# Patient Record
Sex: Female | Born: 1970
Health system: Southern US, Community
[De-identification: ages and names within clinical notes are randomized; demographics above are authoritative.]

## PROBLEM LIST (undated history)

## (undated) SURGERY — APPENDECTOMY, LAPAROSCOPIC
Anesthesia: General

---
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
## 1992-11-20 HISTORY — PX: KNEE ARTHROSCOPY W/ ACL RECONSTRUCTION: SHX1858

## 2000-12-10 ENCOUNTER — Other Ambulatory Visit: Admission: RE | Admit: 2000-12-10 | Discharge: 2000-12-10 | Payer: Self-pay | Admitting: Obstetrics and Gynecology

## 2001-01-13 ENCOUNTER — Ambulatory Visit (HOSPITAL_COMMUNITY): Admission: RE | Admit: 2001-01-13 | Discharge: 2001-01-13 | Payer: Self-pay | Admitting: Obstetrics and Gynecology

## 2001-01-13 ENCOUNTER — Encounter: Payer: Self-pay | Admitting: Obstetrics and Gynecology

## 2001-06-06 ENCOUNTER — Inpatient Hospital Stay (HOSPITAL_COMMUNITY): Admission: AD | Admit: 2001-06-06 | Discharge: 2001-06-06 | Payer: Self-pay | Admitting: Obstetrics and Gynecology

## 2001-06-10 ENCOUNTER — Inpatient Hospital Stay (HOSPITAL_COMMUNITY): Admission: AD | Admit: 2001-06-10 | Discharge: 2001-06-13 | Payer: Self-pay | Admitting: Obstetrics and Gynecology

## 2001-06-14 ENCOUNTER — Encounter: Admission: RE | Admit: 2001-06-14 | Discharge: 2001-07-14 | Payer: Self-pay | Admitting: Obstetrics and Gynecology

## 2007-11-17 ENCOUNTER — Ambulatory Visit: Payer: Self-pay | Admitting: Internal Medicine

## 2009-11-01 ENCOUNTER — Emergency Department: Payer: Self-pay | Admitting: Emergency Medicine

## 2013-04-11 ENCOUNTER — Encounter (HOSPITAL_COMMUNITY): Payer: Self-pay | Admitting: Anesthesiology

## 2013-04-11 ENCOUNTER — Encounter (HOSPITAL_COMMUNITY): Payer: Self-pay | Admitting: *Deleted

## 2013-04-11 ENCOUNTER — Encounter (HOSPITAL_COMMUNITY)
Admission: EM | Disposition: A | Discharge status: Home / Self Care | Payer: Self-pay | Source: Home / Self Care | Attending: Emergency Medicine

## 2013-04-11 ENCOUNTER — Observation Stay (HOSPITAL_COMMUNITY): Payer: BC Managed Care – PPO | Admitting: Anesthesiology

## 2013-04-11 ENCOUNTER — Emergency Department (HOSPITAL_COMMUNITY): Payer: BC Managed Care – PPO

## 2013-04-11 ENCOUNTER — Observation Stay (HOSPITAL_COMMUNITY)
Admission: EM | Admit: 2013-04-11 | Discharge: 2013-04-12 | Disposition: A | Discharge status: Home / Self Care | Payer: BC Managed Care – PPO | Attending: General Surgery | Admitting: General Surgery

## 2013-04-11 DIAGNOSIS — K3580 Unspecified acute appendicitis: Secondary | ICD-10-CM

## 2013-04-11 DIAGNOSIS — K37 Unspecified appendicitis: Secondary | ICD-10-CM

## 2013-04-11 HISTORY — PX: LAPAROSCOPIC APPENDECTOMY: SHX408

## 2013-04-11 LAB — URINALYSIS, ROUTINE W REFLEX MICROSCOPIC
Bilirubin Urine: NEGATIVE
Glucose, UA: NEGATIVE mg/dL
Hgb urine dipstick: NEGATIVE
Ketones, ur: NEGATIVE mg/dL
Leukocytes, UA: NEGATIVE
Nitrite: NEGATIVE
Protein, ur: NEGATIVE mg/dL
Specific Gravity, Urine: 1.008 (ref 1.005–1.030)
Urobilinogen, UA: 0.2 mg/dL (ref 0.0–1.0)
pH: 5.5 (ref 5.0–8.0)

## 2013-04-11 LAB — COMPREHENSIVE METABOLIC PANEL
ALT: 30 U/L (ref 0–35)
AST: 32 U/L (ref 0–37)
Albumin: 4.5 g/dL (ref 3.5–5.2)
Alkaline Phosphatase: 71 U/L (ref 39–117)
BUN: 8 mg/dL (ref 6–23)
CO2: 20 mEq/L (ref 19–32)
Calcium: 9.9 mg/dL (ref 8.4–10.5)
Chloride: 99 mEq/L (ref 96–112)
Creatinine, Ser: 0.68 mg/dL (ref 0.50–1.10)
GFR calc Af Amer: >90 mL/min (ref >90)
GFR calc non Af Amer: >90 mL/min (ref >90)
Glucose, Bld: 103 mg/dL — ABNORMAL HIGH (ref 70–99)
Potassium: 3.5 mEq/L (ref 3.5–5.1)
Sodium: 133 mEq/L — ABNORMAL LOW (ref 135–145)
Total Bilirubin: 0.7 mg/dL (ref 0.3–1.2)
Total Protein: 8.2 g/dL (ref 6.0–8.3)

## 2013-04-11 LAB — CBC WITH DIFFERENTIAL/PLATELET
Basophils Absolute: 0 10*3/uL (ref 0.0–0.1)
Basophils Relative: 0 % (ref 0–1)
Eosinophils Absolute: 0 10*3/uL (ref 0.0–0.7)
Eosinophils Relative: 0 % (ref 0–5)
HCT: 42.5 % (ref 36.0–46.0)
Hemoglobin: 15.6 g/dL — ABNORMAL HIGH (ref 12.0–15.0)
Lymphocytes Relative: 10 % — ABNORMAL LOW (ref 12–46)
Lymphs Abs: 1.1 10*3/uL (ref 0.7–4.0)
MCH: 32.9 pg (ref 26.0–34.0)
MCHC: 36.7 g/dL — ABNORMAL HIGH (ref 30.0–36.0)
MCV: 89.7 fL (ref 78.0–100.0)
Monocytes Absolute: 0.6 10*3/uL (ref 0.1–1.0)
Monocytes Relative: 6 % (ref 3–12)
Neutro Abs: 8.6 10*3/uL — ABNORMAL HIGH (ref 1.7–7.7)
Neutrophils Relative %: 84 % — ABNORMAL HIGH (ref 43–77)
Platelets: 232 10*3/uL (ref 150–400)
RBC: 4.74 MIL/uL (ref 3.87–5.11)
RDW: 12.5 % (ref 11.5–15.5)
WBC: 10.2 10*3/uL (ref 4.0–10.5)

## 2013-04-11 LAB — WET PREP, GENITAL
Clue Cells Wet Prep HPF POC: NONE SEEN
Trich, Wet Prep: NONE SEEN
WBC, Wet Prep HPF POC: NONE SEEN
Yeast Wet Prep HPF POC: NONE SEEN

## 2013-04-11 LAB — POCT PREGNANCY, URINE: Preg Test, Ur: NEGATIVE

## 2013-04-11 SURGERY — APPENDECTOMY, LAPAROSCOPIC
Anesthesia: General | Site: Abdomen | Wound class: Contaminated

## 2013-04-11 MED ORDER — MORPHINE SULFATE 4 MG/ML IJ SOLN
4.0000 mg | Freq: Once | INTRAMUSCULAR | Status: AC
  Administered 2013-04-11: 4 mg via INTRAVENOUS
  Filled 2013-04-11: qty 1

## 2013-04-11 MED ORDER — DIPHENHYDRAMINE HCL 12.5 MG/5ML PO ELIX
12.5000 mg | ORAL_SOLUTION | Freq: Four times a day (QID) | ORAL | Status: DC | PRN
  Filled 2013-04-11: qty 10

## 2013-04-11 MED ORDER — ACETAMINOPHEN 325 MG PO TABS: 650.0000 mg | ORAL_TABLET | Freq: Four times a day (QID) | ORAL | Status: DC | PRN

## 2013-04-11 MED ORDER — LIDOCAINE HCL 1 % IJ SOLN
INTRAMUSCULAR | Status: DC | PRN
  Administered 2013-04-11: 20:00:00 via INTRADERMAL

## 2013-04-11 MED ORDER — ONDANSETRON HCL 4 MG/2ML IJ SOLN
4.0000 mg | Freq: Once | INTRAMUSCULAR | Status: AC
  Administered 2013-04-11: 4 mg via INTRAVENOUS
  Filled 2013-04-11: qty 2

## 2013-04-11 MED ORDER — NEOSTIGMINE METHYLSULFATE 1 MG/ML IJ SOLN
INTRAMUSCULAR | Status: DC | PRN
  Administered 2013-04-11: 5 mg via INTRAVENOUS

## 2013-04-11 MED ORDER — VECURONIUM BROMIDE 10 MG IV SOLR
INTRAVENOUS | Status: DC | PRN
  Administered 2013-04-11: 4 mg via INTRAVENOUS

## 2013-04-11 MED ORDER — FENTANYL CITRATE 0.05 MG/ML IJ SOLN
INTRAMUSCULAR | Status: DC | PRN
  Administered 2013-04-11 (×3): 100 ug via INTRAVENOUS

## 2013-04-11 MED ORDER — KETOROLAC TROMETHAMINE 30 MG/ML IJ SOLN
15.0000 mg | Freq: Once | INTRAMUSCULAR | Status: AC | PRN
  Administered 2013-04-11: 30 mg via INTRAVENOUS

## 2013-04-11 MED ORDER — PROPOFOL 10 MG/ML IV BOLUS
INTRAVENOUS | Status: DC | PRN
  Administered 2013-04-11: 170 mg via INTRAVENOUS

## 2013-04-11 MED ORDER — KETOROLAC TROMETHAMINE 30 MG/ML IJ SOLN
INTRAMUSCULAR | Status: AC
  Filled 2013-04-11: qty 1

## 2013-04-11 MED ORDER — ONDANSETRON HCL 4 MG/2ML IJ SOLN
INTRAMUSCULAR | Status: DC | PRN
  Administered 2013-04-11: 4 mg via INTRAVENOUS

## 2013-04-11 MED ORDER — DEXAMETHASONE SODIUM PHOSPHATE 4 MG/ML IJ SOLN
INTRAMUSCULAR | Status: DC | PRN
  Administered 2013-04-11: 8 mg via INTRAVENOUS

## 2013-04-11 MED ORDER — IOHEXOL 300 MG/ML  SOLN
25.0000 mL | INTRAMUSCULAR | Status: DC | PRN
  Administered 2013-04-11: 25 mL via ORAL

## 2013-04-11 MED ORDER — SUCCINYLCHOLINE CHLORIDE 20 MG/ML IJ SOLN
INTRAMUSCULAR | Status: DC | PRN
  Administered 2013-04-11: 120 mg via INTRAVENOUS

## 2013-04-11 MED ORDER — OXYCODONE-ACETAMINOPHEN 5-325 MG PO TABS
1.0000 | ORAL_TABLET | ORAL | Status: DC | PRN
Start: 2013-04-11 — End: 2013-04-12
  Administered 2013-04-12 (×3): 2 via ORAL
  Filled 2013-04-11 (×3): qty 2

## 2013-04-11 MED ORDER — HYDROMORPHONE HCL PF 1 MG/ML IJ SOLN
INTRAMUSCULAR | Status: AC
  Filled 2013-04-11: qty 1

## 2013-04-11 MED ORDER — SODIUM CHLORIDE 0.9 % IV SOLN
1.0000 g | Freq: Once | INTRAVENOUS | Status: AC
  Administered 2013-04-11: 1 g via INTRAVENOUS
  Filled 2013-04-11: qty 1

## 2013-04-11 MED ORDER — HYDROMORPHONE HCL PF 1 MG/ML IJ SOLN
0.5000 mg | INTRAMUSCULAR | Status: DC | PRN
  Administered 2013-04-11 – 2013-04-12 (×3): 1 mg via INTRAVENOUS
  Filled 2013-04-11 (×3): qty 1

## 2013-04-11 MED ORDER — SODIUM CHLORIDE 0.9 % IV BOLUS (SEPSIS)
1000.0000 mL | Freq: Once | INTRAVENOUS | Status: AC
  Administered 2013-04-11: 1000 mL via INTRAVENOUS

## 2013-04-11 MED ORDER — IOHEXOL 300 MG/ML  SOLN
100.0000 mL | Freq: Once | INTRAMUSCULAR | Status: AC | PRN
  Administered 2013-04-11: 100 mL via INTRAVENOUS

## 2013-04-11 MED ORDER — LACTATED RINGERS IV SOLN
INTRAVENOUS | Status: DC | PRN
  Administered 2013-04-11 (×2): via INTRAVENOUS

## 2013-04-11 MED ORDER — HYDROMORPHONE HCL PF 1 MG/ML IJ SOLN
0.2500 mg | INTRAMUSCULAR | Status: DC | PRN
  Administered 2013-04-11 (×2): 0.5 mg via INTRAVENOUS

## 2013-04-11 MED ORDER — ACETAMINOPHEN 650 MG RE SUPP: 650.0000 mg | Freq: Four times a day (QID) | RECTAL | Status: DC | PRN

## 2013-04-11 MED ORDER — SODIUM CHLORIDE 0.9 % IR SOLN
Status: DC | PRN
  Administered 2013-04-11: 1

## 2013-04-11 MED ORDER — PANTOPRAZOLE SODIUM 40 MG PO TBEC
40.0000 mg | DELAYED_RELEASE_TABLET | Freq: Every day | ORAL | Status: DC
  Administered 2013-04-11 – 2013-04-12 (×2): 40 mg via ORAL
  Filled 2013-04-11 (×2): qty 1

## 2013-04-11 MED ORDER — ARTIFICIAL TEARS OP OINT
TOPICAL_OINTMENT | OPHTHALMIC | Status: DC | PRN
  Administered 2013-04-11: 1 via OPHTHALMIC

## 2013-04-11 MED ORDER — DIPHENHYDRAMINE HCL 50 MG/ML IJ SOLN: 12.5000 mg | Freq: Four times a day (QID) | INTRAMUSCULAR | Status: DC | PRN

## 2013-04-11 MED ORDER — ONDANSETRON HCL 4 MG/2ML IJ SOLN: 4.0000 mg | Freq: Once | INTRAMUSCULAR | Status: DC | PRN

## 2013-04-11 MED ORDER — SODIUM CHLORIDE 0.9 % IR SOLN
Status: DC | PRN
  Administered 2013-04-11: 1000 mL

## 2013-04-11 MED ORDER — LIDOCAINE HCL (CARDIAC) 20 MG/ML IV SOLN
INTRAVENOUS | Status: DC | PRN
  Administered 2013-04-11: 40 mg via INTRAVENOUS
  Administered 2013-04-11: 60 mg via INTRAVENOUS

## 2013-04-11 MED ORDER — POTASSIUM CHLORIDE IN NACL 20-0.9 MEQ/L-% IV SOLN
INTRAVENOUS | Status: DC
  Filled 2013-04-11 (×2): qty 1000

## 2013-04-11 MED ORDER — KCL IN DEXTROSE-NACL 20-5-0.45 MEQ/L-%-% IV SOLN
INTRAVENOUS | Status: DC
  Administered 2013-04-11: 22:00:00 via INTRAVENOUS
  Filled 2013-04-11 (×5): qty 1000

## 2013-04-11 MED ORDER — ONDANSETRON HCL 4 MG/2ML IJ SOLN: 4.0000 mg | Freq: Four times a day (QID) | INTRAMUSCULAR | Status: DC | PRN

## 2013-04-11 MED ORDER — GLYCOPYRROLATE 0.2 MG/ML IJ SOLN
INTRAMUSCULAR | Status: DC | PRN
  Administered 2013-04-11: 0.6 mg via INTRAVENOUS

## 2013-04-11 MED ORDER — MIDAZOLAM HCL 5 MG/5ML IJ SOLN
INTRAMUSCULAR | Status: DC | PRN
  Administered 2013-04-11: 2 mg via INTRAVENOUS

## 2013-04-11 SURGICAL SUPPLY — 41 items
APPLIER CLIP ROT 10 11.4 M/L (STAPLE)
BLADE SURG ROTATE 9660 (MISCELLANEOUS) ×2 IMPLANT
CANISTER SUCTION 2500CC (MISCELLANEOUS) ×2 IMPLANT
CHLORAPREP W/TINT 26ML (MISCELLANEOUS) ×2 IMPLANT
CLIP APPLIE ROT 10 11.4 M/L (STAPLE) IMPLANT
CLOTH BEACON ORANGE TIMEOUT ST (SAFETY) ×2 IMPLANT
COVER SURGICAL LIGHT HANDLE (MISCELLANEOUS) ×2 IMPLANT
CUTTER FLEX LINEAR 45M (STAPLE) ×2 IMPLANT
DERMABOND ADHESIVE PROPEN (GAUZE/BANDAGES/DRESSINGS) ×1
DERMABOND ADVANCED (GAUZE/BANDAGES/DRESSINGS) ×1
DERMABOND ADVANCED .7 DNX12 (GAUZE/BANDAGES/DRESSINGS) ×1 IMPLANT
DERMABOND ADVANCED .7 DNX6 (GAUZE/BANDAGES/DRESSINGS) ×1 IMPLANT
DRAPE UTILITY 15X26 W/TAPE STR (DRAPE) ×4 IMPLANT
DRAPE WARM FLUID 44X44 (DRAPE) ×2 IMPLANT
ELECT REM PT RETURN 9FT ADLT (ELECTROSURGICAL) ×2
ELECTRODE REM PT RTRN 9FT ADLT (ELECTROSURGICAL) ×1 IMPLANT
ENDOLOOP SUT PDS II  0 18 (SUTURE)
ENDOLOOP SUT PDS II 0 18 (SUTURE) IMPLANT
GLOVE BIO SURGEON STRL SZ 6 (GLOVE) ×2 IMPLANT
GLOVE BIOGEL PI IND STRL 6.5 (GLOVE) ×1 IMPLANT
GLOVE BIOGEL PI INDICATOR 6.5 (GLOVE) ×1
GOWN PREVENTION PLUS XXLARGE (GOWN DISPOSABLE) ×4 IMPLANT
GOWN STRL NON-REIN LRG LVL3 (GOWN DISPOSABLE) ×4 IMPLANT
KIT BASIN OR (CUSTOM PROCEDURE TRAY) ×2 IMPLANT
KIT ROOM TURNOVER OR (KITS) ×2 IMPLANT
NS IRRIG 1000ML POUR BTL (IV SOLUTION) ×2 IMPLANT
PAD ARMBOARD 7.5X6 YLW CONV (MISCELLANEOUS) ×4 IMPLANT
POUCH SPECIMEN RETRIEVAL 10MM (ENDOMECHANICALS) ×2 IMPLANT
RELOAD STAPLE TA45 3.5 REG BLU (ENDOMECHANICALS) ×2 IMPLANT
SCALPEL HARMONIC ACE (MISCELLANEOUS) ×2 IMPLANT
SET IRRIG TUBING LAPAROSCOPIC (IRRIGATION / IRRIGATOR) ×2 IMPLANT
SLEEVE ENDOPATH XCEL 5M (ENDOMECHANICALS) ×2 IMPLANT
SPECIMEN JAR SMALL (MISCELLANEOUS) ×2 IMPLANT
SUT MNCRL AB 4-0 PS2 18 (SUTURE) ×2 IMPLANT
TOWEL OR 17X24 6PK STRL BLUE (TOWEL DISPOSABLE) ×2 IMPLANT
TOWEL OR 17X26 10 PK STRL BLUE (TOWEL DISPOSABLE) ×2 IMPLANT
TRAY FOLEY CATH 16FR SILVER (SET/KITS/TRAYS/PACK) ×2 IMPLANT
TRAY LAPAROSCOPIC (CUSTOM PROCEDURE TRAY) ×2 IMPLANT
TROCAR XCEL BLUNT TIP 100MML (ENDOMECHANICALS) ×2 IMPLANT
TROCAR XCEL NON-BLD 5MMX100MML (ENDOMECHANICALS) ×2 IMPLANT
WATER STERILE IRR 1000ML POUR (IV SOLUTION) IMPLANT

## 2013-04-11 NOTE — H&P (Signed)
Seen, examined, agree with above.  Plan lap appy.  Discussed risks with patient and family.

## 2013-04-11 NOTE — Transfer of Care (Signed)
Immediate Anesthesia Transfer of Care Note  Patient: Deanna Mcmillan  Procedure(s) Performed: Procedure(s): APPENDECTOMY LAPAROSCOPIC (N/A)  Patient Location: PACU  Anesthesia Type:General  Level of Consciousness: alert , oriented, sedated, patient cooperative and responds to stimulation  Airway & Oxygen Therapy: Patient Spontanous Breathing and Patient connected to nasal cannula oxygen  Post-op Assessment: Report given to PACU RN, Post -op Vital signs reviewed and stable, Patient moving all extremities and Patient moving all extremities X 4  Post vital signs: Reviewed and stable  Complications: No apparent anesthesia complications

## 2013-04-11 NOTE — Anesthesia Preprocedure Evaluation (Signed)
Anesthesia Evaluation  Patient identified by MRN, date of birth, ID band Patient awake    Reviewed: Allergy & Precautions, H&P , NPO status , Patient's Chart, lab work & pertinent test results  Airway Mallampati: II TM Distance: >3 FB Neck ROM: Full    Dental  (+) Teeth Intact and Dental Advisory Given   Pulmonary  breath sounds clear to auscultation        Cardiovascular Rhythm:Regular Rate:Normal     Neuro/Psych    GI/Hepatic   Endo/Other    Renal/GU      Musculoskeletal   Abdominal (+) + obese,  Abdomen: tender.    Peds  Hematology   Anesthesia Other Findings   Reproductive/Obstetrics                           Anesthesia Physical Anesthesia Plan  ASA: II and emergent  Anesthesia Plan: General   Post-op Pain Management:    Induction: Intravenous  Airway Management Planned: Oral ETT  Additional Equipment:   Intra-op Plan:   Post-operative Plan: Extubation in OR  Informed Consent: I have reviewed the patients History and Physical, chart, labs and discussed the procedure including the risks, benefits and alternatives for the proposed anesthesia with the patient or authorized representative who has indicated his/her understanding and acceptance.   Dental advisory given  Plan Discussed with: CRNA and Anesthesiologist  Anesthesia Plan Comments: (Acute Appendicitis, full stomach Mild obesity  Plan GA with RSI  Roberts Gaudy, MD)        Anesthesia Quick Evaluation

## 2013-04-11 NOTE — ED Provider Notes (Signed)
Medical screening examination/treatment/procedure(s) were conducted as a shared visit with non-physician practitioner(s) and myself.  I personally evaluated the patient during the encounter  Orlie Dakin, MD 04/11/13 1545

## 2013-04-11 NOTE — H&P (Signed)
Deanna Mcmillan is an 42 y.o. female.    PCP:  None currently Referring MD:  Dr. Winfred Leeds Chief Complaint: Acute appendicitis HPI:  42 y.o. female with no medical or surgical history presents to the [facility] complaining of acute, persistent, progressively worsening RLQ abdominal pain beginning at 3 AM this morning. Patient reports that the pain now radiates over to the left side of her abdomen. She denies any urinary symptoms.  Patient also with associated low back pain beginning several hours after the abdominal pain.  Patient reports normal bowel movement this morning and denies of nausea, vomiting or diarrhea. Patient also denies headache, chest pain, shortness of breath, weakness, dizziness.  Patient reports she does not smoke and is a daily drinker. She reports last night she had one large mixed drink but usually only drinks one glass of wine per day.   History reviewed. No pertinent past medical history.  History reviewed. No pertinent past surgical history.  No family history on file. Social History:  reports that she has never smoked. She does not have any smokeless tobacco history on file. She reports that  drinks alcohol. She reports that she does not use illicit drugs.  Allergies: No Known Allergies   (Not in a hospital admission)  Results for orders placed during the hospital encounter of 04/11/13 (from the past 48 hour(s))  CBC WITH DIFFERENTIAL     Status: Abnormal   Collection Time    04/11/13 12:34 PM      Result Value Range   WBC 10.2  4.0 - 10.5 K/uL   RBC 4.74  3.87 - 5.11 MIL/uL   Hemoglobin 15.6 (*) 12.0 - 15.0 g/dL   HCT 42.5  36.0 - 46.0 %   MCV 89.7  78.0 - 100.0 fL   MCH 32.9  26.0 - 34.0 pg   MCHC 36.7 (*) 30.0 - 36.0 g/dL   RDW 12.5  11.5 - 15.5 %   Platelets 232  150 - 400 K/uL   Neutrophils Relative % 84 (*) 43 - 77 %   Neutro Abs 8.6 (*) 1.7 - 7.7 K/uL   Lymphocytes Relative 10 (*) 12 - 46 %   Lymphs Abs 1.1  0.7 - 4.0 K/uL   Monocytes Relative 6  3  - 12 %   Monocytes Absolute 0.6  0.1 - 1.0 K/uL   Eosinophils Relative 0  0 - 5 %   Eosinophils Absolute 0.0  0.0 - 0.7 K/uL   Basophils Relative 0  0 - 1 %   Basophils Absolute 0.0  0.0 - 0.1 K/uL  COMPREHENSIVE METABOLIC PANEL     Status: Abnormal   Collection Time    04/11/13 12:34 PM      Result Value Range   Sodium 133 (*) 135 - 145 mEq/L   Potassium 3.5  3.5 - 5.1 mEq/L   Chloride 99  96 - 112 mEq/L   CO2 20  19 - 32 mEq/L   Glucose, Bld 103 (*) 70 - 99 mg/dL   BUN 8  6 - 23 mg/dL   Creatinine, Ser 0.68  0.50 - 1.10 mg/dL   Calcium 9.9  8.4 - 10.5 mg/dL   Total Protein 8.2  6.0 - 8.3 g/dL   Albumin 4.5  3.5 - 5.2 g/dL   AST 32  0 - 37 U/L   ALT 30  0 - 35 U/L   Alkaline Phosphatase 71  39 - 117 U/L   Total Bilirubin 0.7  0.3 -  1.2 mg/dL   GFR calc non Af Amer >90  >90 mL/min   GFR calc Af Amer >90  >90 mL/min   Comment: (NOTE)     The eGFR has been calculated using the CKD EPI equation.     This calculation has not been validated in all clinical situations.     eGFR's persistently <90 mL/min signify possible Chronic Kidney     Disease.  URINALYSIS, ROUTINE W REFLEX MICROSCOPIC     Status: Abnormal   Collection Time    04/11/13 12:39 PM      Result Value Range   Color, Urine YELLOW  YELLOW   APPearance CLOUDY (*) CLEAR   Specific Gravity, Urine 1.008  1.005 - 1.030   pH 5.5  5.0 - 8.0   Glucose, UA NEGATIVE  NEGATIVE mg/dL   Hgb urine dipstick NEGATIVE  NEGATIVE   Bilirubin Urine NEGATIVE  NEGATIVE   Ketones, ur NEGATIVE  NEGATIVE mg/dL   Protein, ur NEGATIVE  NEGATIVE mg/dL   Urobilinogen, UA 0.2  0.0 - 1.0 mg/dL   Nitrite NEGATIVE  NEGATIVE   Leukocytes, UA NEGATIVE  NEGATIVE   Comment: MICROSCOPIC NOT DONE ON URINES WITH NEGATIVE PROTEIN, BLOOD, LEUKOCYTES, NITRITE, OR GLUCOSE <1000 mg/dL.  POCT PREGNANCY, URINE     Status: None   Collection Time    04/11/13 12:44 PM      Result Value Range   Preg Test, Ur NEGATIVE  NEGATIVE   Comment:            THE  SENSITIVITY OF THIS     METHODOLOGY IS >24 mIU/mL  WET PREP, GENITAL     Status: None   Collection Time    04/11/13  2:08 PM      Result Value Range   Yeast Wet Prep HPF POC NONE SEEN  NONE SEEN   Trich, Wet Prep NONE SEEN  NONE SEEN   Clue Cells Wet Prep HPF POC NONE SEEN  NONE SEEN   WBC, Wet Prep HPF POC NONE SEEN  NONE SEEN   Ct Abdomen Pelvis W Contrast  04/11/2013   *RADIOLOGY REPORT*  Clinical Data: Right lower quadrant abdomen pain  CT ABDOMEN AND PELVIS WITH CONTRAST  Technique:  Multidetector CT imaging of the abdomen and pelvis was performed following the standard protocol during bolus administration of intravenous contrast.  Contrast: 128mL OMNIPAQUE IOHEXOL 300 MG/ML  SOLN  Comparison: None.  Findings: There is a 1.8 cm cyst in the left lobe liver.  With the liver is otherwise normal.  The spleen, pancreas, gallbladder, adrenal glands and kidneys are normal.  There is no hydronephrosis bilaterally.  The aorta is normal.  There is no abdominal lymphadenopathy.  The appendix is enlarged with surrounding inflammation consistent with appendicitis.  There is no small bowel obstruction or diverticulitis.  Fluid filled bladder is normal.  The uterus is normal.  There are small cysts  in normal size bilateral ovaries.  There is minimal dependent atelectasis of the posterior lung bases.Degenerative joint changes of the spine are noted.  IMPRESSION: Acute appendicitis.   Original Report Authenticated By: Abelardo Diesel, M.D.    Review of Systems  Constitutional: Negative for fever and chills.  Respiratory: Negative for cough and wheezing.   Cardiovascular: Negative for chest pain.  Gastrointestinal: Positive for abdominal pain. Negative for heartburn, nausea, vomiting, diarrhea, constipation and blood in stool.  Genitourinary: Negative for dysuria and urgency.  Neurological: Positive for weakness. Negative for dizziness and headaches.  Blood pressure 124/75, pulse 80, temperature 98.5 F  (36.9 C), temperature source Oral, resp. rate 17, last menstrual period 03/06/2013, SpO2 96.00%. Physical Exam   Assessment/Plan Acute appendicitis 1.  Admit to [facility] 2.  NPO, IVF, pain control, antiemetics, antibiotics (Invanz given by ED PA) 3.  OR scheduled around 6:15pm 4.  Discussed risks of bleeding, infection, injury to other structures, and conversion to open.  The patient wishes to proceed. 5.  Hopefully d/c tomorrow am   Coralie Keens 04/11/2013, 3:41 PM

## 2013-04-11 NOTE — ED Notes (Signed)
Pt states that at 0255 pt was woke with lower abdominal pain and radiation now into back.  Pt reports tender to palpations.  No pain or burning with urination.  Pt denies constipation.  No vaginal discharge or bleeding

## 2013-04-11 NOTE — ED Provider Notes (Signed)
CSN: [id number]     Arrival date & time 04/11/13  1156 History   First MD Initiated Contact with Patient 04/11/13 1227     Chief Complaint  Patient presents with  . Abdominal Pain  . Back Pain   (Consider location/radiation/quality/duration/timing/severity/associated sxs/prior Treatment) The history is provided by the patient and medical records. No language interpreter was used.    Deanna Mcmillan is a 42 y.o. female  with no medical or surgical history presents to the Emergency Department complaining of acute, persistent, progressively worsening right lower quadrant abdominal pain beginning at 3 AM this morning. Patient reports that the pain now radiates over to the left side of her abdomen and all blood around her back. She denies history of kidney stone, dysuria, hematuria, frequency, urgency. She also denies vaginal discharge or bleeding. Patient also with associated low back pain beginning several hours after the abdominal pain. Patient reports that last menstrual period was with 4 weeks ago.  Standing and walking seem to make the pain slightly better and palpation makes it worse. Patient reports normal bowel movement this morning and no history of vomiting or diarrhea.  Patient also denies headache, neck pain, chest pain, shortness of breath, weakness, dizziness, syncope, dysuria, hematuria.  Patient reports she does not smoke and is a daily drinker. She reports last night she had one large mixed drink but usually only drinks one glass of wine per day.  History reviewed. No pertinent past medical history. History reviewed. No pertinent past surgical history. No family history on file. History  Substance Use Topics  . Smoking status: Never Smoker   . Smokeless tobacco: Not on file  . Alcohol Use: Yes     Comment: daily one glass of wine   OB History   Grav Para Term Preterm Abortions TAB SAB Ect Mult Living                 Review of Systems  Constitutional: Negative for fever,  diaphoresis, appetite change, fatigue and unexpected weight change.  HENT: Negative for mouth sores and neck stiffness.   Eyes: Negative for visual disturbance.  Respiratory: Negative for cough, chest tightness, shortness of breath and wheezing.   Cardiovascular: Negative for chest pain.  Gastrointestinal: Positive for nausea and abdominal pain. Negative for vomiting, diarrhea and constipation.  Endocrine: Negative for polydipsia, polyphagia and polyuria.  Genitourinary: Negative for dysuria, urgency, frequency and hematuria.  Musculoskeletal: Positive for back pain.  Skin: Negative for rash.  Allergic/Immunologic: Negative for immunocompromised state.  Neurological: Negative for syncope, light-headedness and headaches.  Hematological: Does not bruise/bleed easily.  Psychiatric/Behavioral: Negative for sleep disturbance. The patient is not nervous/anxious.     Allergies  Review of patient's allergies indicates no known allergies.  Home Medications   Current Outpatient Rx  Name  Route  Sig  Dispense  Refill  . naproxen sodium (ANAPROX) 220 MG tablet   Oral   Take 220 mg by mouth as needed (for headaches.).         Marland Kitchen OMEPRAZOLE PO   Oral   Take 1 tablet by mouth daily as needed (for acid reflux.).          BP 124/75  Pulse 80  Temp(Src) 98.5 F (36.9 C) (Oral)  Resp 17  SpO2 96%  LMP 03/06/2013 Physical Exam  Nursing note and vitals reviewed. Constitutional: She is oriented to person, place, and time. She appears well-developed and well-nourished. No distress.  Awake, alert, nontoxic appearance  HENT:  Head:  Normocephalic and atraumatic.  Mouth/Throat: Oropharynx is clear and moist. No oropharyngeal exudate.  Eyes: Conjunctivae are normal. Pupils are equal, round, and reactive to light. No scleral icterus.  Neck: Normal range of motion. Neck supple.  Cardiovascular: Normal rate, regular rhythm, normal heart sounds and intact distal pulses.   No murmur  heard. Pulmonary/Chest: Effort normal and breath sounds normal. No respiratory distress. She has no wheezes.  Abdominal: Soft. Bowel sounds are normal. She exhibits no distension and no mass. There is no hepatomegaly. There is tenderness in the right upper quadrant, right lower quadrant and suprapubic area. There is guarding. There is no rebound and no CVA tenderness.  Patient with severe right lower cardiac abdominal pain to palpation, mild right upper quadrant pain to palpation and mild suprapubic pain to palpation No CVA tenderness No palpable hepatomegaly  Musculoskeletal: Normal range of motion. She exhibits no edema.  Lymphadenopathy:    She has no cervical adenopathy.  Neurological: She is alert and oriented to person, place, and time. She exhibits normal muscle tone. Coordination normal.  Speech is clear and goal oriented Moves extremities without ataxia  Skin: Skin is warm and dry. No rash noted. She is not diaphoretic. No erythema.  Psychiatric: She has a normal mood and affect.    ED Course  Procedures (including critical care time) Labs Review Labs Reviewed  CBC WITH DIFFERENTIAL - Abnormal; Notable for the following:    Hemoglobin 15.6 (*)    MCHC 36.7 (*)    Neutrophils Relative % 84 (*)    Neutro Abs 8.6 (*)    Lymphocytes Relative 10 (*)    All other components within normal limits  COMPREHENSIVE METABOLIC PANEL - Abnormal; Notable for the following:    Sodium 133 (*)    Glucose, Bld 103 (*)    All other components within normal limits  URINALYSIS, ROUTINE W REFLEX MICROSCOPIC - Abnormal; Notable for the following:    APPearance CLOUDY (*)    All other components within normal limits  WET PREP, GENITAL  GC/CHLAMYDIA PROBE AMP  POCT PREGNANCY, URINE   Imaging Review Ct Abdomen Pelvis W Contrast  04/11/2013   *RADIOLOGY REPORT*  Clinical Data: Right lower quadrant abdomen pain  CT ABDOMEN AND PELVIS WITH CONTRAST  Technique:  Multidetector CT imaging of the  abdomen and pelvis was performed following the standard protocol during bolus administration of intravenous contrast.  Contrast: 178mL OMNIPAQUE IOHEXOL 300 MG/ML  SOLN  Comparison: None.  Findings: There is a 1.8 cm cyst in the left lobe liver.  With the liver is otherwise normal.  The spleen, pancreas, gallbladder, adrenal glands and kidneys are normal.  There is no hydronephrosis bilaterally.  The aorta is normal.  There is no abdominal lymphadenopathy.  The appendix is enlarged with surrounding inflammation consistent with appendicitis.  There is no small bowel obstruction or diverticulitis.  Fluid filled bladder is normal.  The uterus is normal.  There are small cysts  in normal size bilateral ovaries.  There is minimal dependent atelectasis of the posterior lung bases.Degenerative joint changes of the spine are noted.  IMPRESSION: Acute appendicitis.   Original Report Authenticated By: Abelardo Diesel, M.D.    MDM   1. Appendicitis      Deanna Mcmillan presents with right lower abdominal pain concerning for possible appendicitis versus colitis. Will obtain pelvic exam to rule out PID or pelvic pain.  Also consideration is possible renal colic though this is of low likelihood.    Wet  prep normal, pregnancy test negative, UA without evidence of urinary tract infection, CBC without leukocytosis and CMP unremarkable.   CT scan of abdomen with acute appendicitis. Patient's last oral intake was 6 PM yesterday evening. Discussed with Dr. Barry Dienes of general surgery who will evaluate and treat. Patient began on Invanz IV. Pain controlled this time. Patient is without intractable vomiting and vital signs are stable.  Jarrett Soho Laderrick Wilk, PA-C 04/11/13 1521

## 2013-04-11 NOTE — Preoperative (Signed)
Beta Blockers   Reason not to administer Beta Blockers:Not Applicable 

## 2013-04-11 NOTE — ED Provider Notes (Signed)
Complains of diffuse lower abdominal pain onset approximately 12 hours ago. Pain has since migrated to the right lower quadrant. CT scan consistent with acute appendicitis  Orlie Dakin, MD 04/11/13 [id number]

## 2013-04-11 NOTE — Op Note (Signed)
Appendectomy, Lap, Procedure Note  Indications: The patient presented with a history of right-sided abdominal pain. A CT revealed findings consistent with acute appendicitis.  Pre-operative Diagnosis: Acute appendicitis without mention of peritonitis  Post-operative Diagnosis: Same  Surgeon: [facility]   Anesthesia: General endotracheal anesthesia and Local anesthesia 1% plain lidocaine, 0.25.% bupivacaine, with epinephrine  Procedure Details  The patient was seen again in the Holding Room. The risks, benefits, complications, treatment options, and expected outcomes were discussed with the patient and/or family. The possibilities of perforation of viscus, bleeding, recurrent infection, the need for additional procedures, failure to diagnose a condition, and creating a complication requiring transfusion or operation were discussed. There was concurrence with the proposed plan and informed consent was obtained. The site of surgery was properly noted. The patient was taken to Operating Room, identified as Deanna Mcmillan and the procedure verified as Appendectomy. A Time Out was held and the above information confirmed.  The patient was placed in the supine position and general anesthesia was induced, along with placement of orogastric tube, Venodyne boots, and a Foley catheter. The abdomen was prepped and draped in a sterile fashion. Local anesthetic was infiltrated in the infraumbilical region.  A 1.5 cm curvilinear transverse incision was made just below the umbilicus.  The Kelly clamp was used to spread the subcutaneous tissues.  The fascia was elevated with 2 Kocher clamps and incised with the #11 blade.  A Claiborne Billings was used to confirm entrance into the peritoneal cavity.  A pursestring suture was placed around the fascial incision.  The Hasson trocar was inserted into the abdomen and held in place with the tails of the suture.  The pneumoperitoneum was then established to steady pressure of 15  mmHg.     Additional 5 mm cannulas then placed in the left lower quadrant of the abdomen and the suprapubic region under direct visualization.  A careful evaluation of the entire abdomen was carried out. The patient was placed in Trendelenburg and rotated to the left.  The small intestines were retracted in the cephalad and left lateral direction away from the pelvis and right lower quadrant. The patient was found to have an enlarged and inflamed appendix that was extending into the pelvis. There was no evidence of perforation.  The appendix was carefully dissected. The appendix was was skeletonized with the harmonic scalpel.   The appendix was divided at its base using an endo-GIA stapler. Minimal appendiceal stump was left in place. The appendix was removed from the abdomen with an Endocatch bag through the left subcostal port.  There was no evidence of bleeding, leakage, or complication after division of the appendix. Irrigation was also performed and irrigate suctioned from the abdomen as well.  The 5 mm trocars were removed.  The pneumoperitoneum was evacuated from the abdomen.    The trocar site skin wounds were closed with 4-0 Monocryl and dressed with Dermabond.  Instrument, sponge, and needle counts were correct at the conclusion of the case.   Findings: The appendix was found to be inflamed. There were mild signs of necrosis.  There was not perforation. There was not abscess formation.  Estimated Blood Loss:  Minimal         Drains: none         Specimens: appendix to pathology         Complications:  None; patient tolerated the procedure well.         Disposition: PACU - hemodynamically stable.  Condition: stable

## 2013-04-12 ENCOUNTER — Encounter (HOSPITAL_COMMUNITY): Payer: Self-pay | Admitting: Anesthesiology

## 2013-04-12 DIAGNOSIS — K3580 Unspecified acute appendicitis: Secondary | ICD-10-CM

## 2013-04-12 LAB — CBC
Hemoglobin: 12.5 g/dL (ref 12.0–15.0)
MCHC: 34 g/dL (ref 30.0–36.0)
MCV: 92 fL (ref 78.0–100.0)
RDW: 12.8 % (ref 11.5–15.5)
WBC: 8.2 10*3/uL (ref 4.0–10.5)

## 2013-04-12 LAB — BASIC METABOLIC PANEL
BUN: 7 mg/dL (ref 6–23)
CO2: 23 mEq/L (ref 19–32)
Creatinine, Ser: 0.68 mg/dL (ref 0.50–1.10)
GFR calc Af Amer: >90 mL/min (ref >90)
GFR calc non Af Amer: >90 mL/min (ref >90)
Glucose, Bld: 148 mg/dL — ABNORMAL HIGH (ref 70–99)
Potassium: 3.8 mEq/L (ref 3.5–5.1)

## 2013-04-12 MED ORDER — OXYCODONE-ACETAMINOPHEN 5-325 MG PO TABS: 1.0000 | ORAL_TABLET | ORAL | Status: DC | PRN

## 2013-04-12 NOTE — Anesthesia Postprocedure Evaluation (Signed)
  Anesthesia Post-op Note  Patient: Deanna Mcmillan  Procedure(s) Performed: Procedure(s): APPENDECTOMY LAPAROSCOPIC (N/A)  Patient Location: PACU  Anesthesia Type:General  Level of Consciousness: awake, alert  and oriented  Airway and Oxygen Therapy: Patient Spontanous Breathing  Post-op Pain: mild  Post-op Assessment: Post-op Vital signs reviewed, Patient's Cardiovascular Status Stable, Respiratory Function Stable, Patent Airway and Pain level controlled  Post-op Vital Signs: stable  Complications: No apparent anesthesia complications

## 2013-04-12 NOTE — Progress Notes (Signed)
1 Day Post-Op  Subjective: Having incisional soreness.  Objective: Vital signs in last 24 hours: Temp:  [96.6 F (35.9 C)-98.5 F (36.9 C)] 98.4 F (36.9 C) (09/21 0404) Pulse Rate:  [66-88] 66 (09/21 0404) Resp:  [14-18] 16 (09/21 0404) BP: (104-154)/(45-87) 113/61 mmHg (09/21 0404) SpO2:  [93 %-100 %] 95 % (09/21 0404) Weight:  [235 lb 14.3 oz (107 kg)] 235 lb 14.3 oz (107 kg) (09/20 2113) Last BM Date: 04/11/13  Intake/Output from previous day: 09/20 0701 - 09/21 0700 In: 2980 [P.O.:840; I.V.:2140] Out: 200 [Urine:200] Intake/Output this shift:    PE: General- In NAD Abdomen-soft, incisions clean and intact  Lab Results:   Recent Labs  04/11/13 1234 04/12/13 0633  WBC 10.2 8.2  HGB 15.6* 12.5  HCT 42.5 36.8  PLT 232 178   BMET  Recent Labs  04/11/13 1234 04/12/13 0633  NA 133* 131*  K 3.5 3.8  CL 99 98  CO2 20 23  GLUCOSE 103* 148*  BUN 8 7  CREATININE 0.68 0.68  CALCIUM 9.9 8.8   PT/INR No results found for this basename: LABPROT, INR,  in the last 72 hours Comprehensive Metabolic Panel:    Component Value Date/Time   NA 131* 04/12/2013 0633   K 3.8 04/12/2013 0633   CL 98 04/12/2013 0633   CO2 23 04/12/2013 0633   BUN 7 04/12/2013 0633   CREATININE 0.68 04/12/2013 0633   GLUCOSE 148* 04/12/2013 0633   CALCIUM 8.8 04/12/2013 0633   AST 32 04/11/2013 1234   ALT 30 04/11/2013 1234   ALKPHOS 71 04/11/2013 1234   BILITOT 0.7 04/11/2013 1234   PROT 8.2 04/11/2013 1234   ALBUMIN 4.5 04/11/2013 1234     Studies/Results: Ct Abdomen Pelvis W Contrast  04/11/2013   *RADIOLOGY REPORT*  Clinical Data: Right lower quadrant abdomen pain  CT ABDOMEN AND PELVIS WITH CONTRAST  Technique:  Multidetector CT imaging of the abdomen and pelvis was performed following the standard protocol during bolus administration of intravenous contrast.  Contrast: 157mL OMNIPAQUE IOHEXOL 300 MG/ML  SOLN  Comparison: None.  Findings: There is a 1.8 cm cyst in the left lobe liver.   With the liver is otherwise normal.  The spleen, pancreas, gallbladder, adrenal glands and kidneys are normal.  There is no hydronephrosis bilaterally.  The aorta is normal.  There is no abdominal lymphadenopathy.  The appendix is enlarged with surrounding inflammation consistent with appendicitis.  There is no small bowel obstruction or diverticulitis.  Fluid filled bladder is normal.  The uterus is normal.  There are small cysts  in normal size bilateral ovaries.  There is minimal dependent atelectasis of the posterior lung bases.Degenerative joint changes of the spine are noted.  IMPRESSION: Acute appendicitis.   Original Report Authenticated By: Abelardo Diesel, M.D.    Anti-infectives: Anti-infectives   Start     Dose/Rate Route Frequency Ordered Stop   04/11/13 1545  ertapenem (INVANZ) 1 g in sodium chloride 0.9 % 50 mL IVPB     1 g 100 mL/hr over 30 Minutes Intravenous  Once 04/11/13 1519 04/11/13 1749      Assessment Principal Problem:   Appendicitis, acute s/p laparoscopic appendectomy 04/11/13-stable overnight    LOS: 1 day   Plan: Advance diet.  Ambulate.  Home later today or tomorrow.  Discharge instructions given to her.   Deanna Mcmillan 04/12/2013

## 2013-04-14 ENCOUNTER — Encounter (HOSPITAL_COMMUNITY): Payer: Self-pay | Admitting: General Surgery

## 2013-04-20 ENCOUNTER — Encounter (INDEPENDENT_AMBULATORY_CARE_PROVIDER_SITE_OTHER): Payer: BC Managed Care – PPO | Admitting: General Surgery

## 2013-04-20 NOTE — Discharge Summary (Signed)
Physician Discharge Summary  Patient ID: Deanna Mcmillan MRN: [id number] DOB/AGE: 1970-09-08 42 y.o.  Admit date: 04/11/2013 Discharge date: 04/20/2013  Admitting Diagnosis: Acute appendicitis  Discharge Diagnosis Patient Active Problem List   Diagnosis Date Noted  . Appendicitis, acute s/p laparoscopic appendectomy 04/11/13 04/12/2013    Consultants None  Imaging: No results found.  Procedures Dr. Barry Dienes (04/11/13) - Laparoscopic Appendectomy  Hospital Course:  42 y.o. female with no medical or surgical history presents to the [facility] complaining of acute, persistent, progressively worsening RLQ abdominal pain beginning at 3 AM on the morning of admission. Patient reports that the pain radiated over to the left side of her abdomen. She denied any urinary symptoms. Patient also with associated low back pain beginning several hours after the abdominal pain. Patient reports normal bowel movement this morning and denies of nausea, vomiting or diarrhea.   Workup showed acute appendicitis without leukocytosis or perforation.  Patient was admitted and underwent procedure listed above.  Tolerated procedure well and was transferred to the floor.  Diet was advanced as tolerated.  On POD #1, the patient was voiding well, tolerating diet, ambulating well, pain well controlled, vital signs stable, incisions c/d/i and felt stable for discharge home.  Patient will follow up in our office in 3 weeks and knows to call with questions or concerns.     Medication List         naproxen sodium 220 MG tablet  Commonly known as:  ANAPROX  Take 220 mg by mouth as needed (for headaches.).     OMEPRAZOLE PO  Take 1 tablet by mouth daily as needed (for acid reflux.).     oxyCODONE-acetaminophen 5-325 MG per tablet  Commonly known as:  PERCOCET/ROXICET  Take 1-2 tablets by mouth every 4 (four) hours as needed.           Signed: Coralie Keens, [facility] Surgery [phone]  04/20/2013,  11:38 AM

## 2013-04-21 NOTE — Discharge Summary (Signed)
Agree with summary. 

## 2013-05-12 ENCOUNTER — Encounter (INDEPENDENT_AMBULATORY_CARE_PROVIDER_SITE_OTHER): Payer: BC Managed Care – PPO

## 2015-08-24 IMAGING — CT CT ABD-PELV W/ CM
2 of 5 series · 17 of 46 positions shown, 19 images · IV contrast (CONTRAST)
Comparison: None.

CLINICAL DATA: Right lower quadrant abdomen pain

CT ABDOMEN AND PELVIS WITH CONTRAST
TECHNIQUE: Multidetector CT imaging of the abdomen and pelvis was
performed following the standard protocol during bolus
administration of intravenous contrast.
Contrast: 100mL OMNIPAQUE IOHEXOL 300 MG/ML  SOLN

[Series 2: routine · axial · 0.89mm/px · z∈[-466,-56]mm · 14 of 92 slices shown, 16 images]
[im 5/92  soft-tissue]
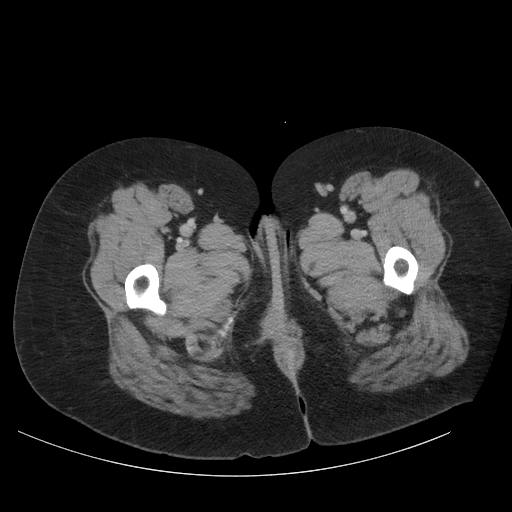
[im 5/92  bone]
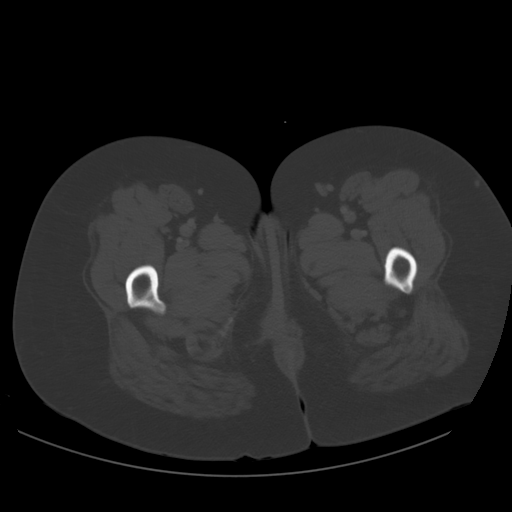
[im 10/92  soft-tissue]
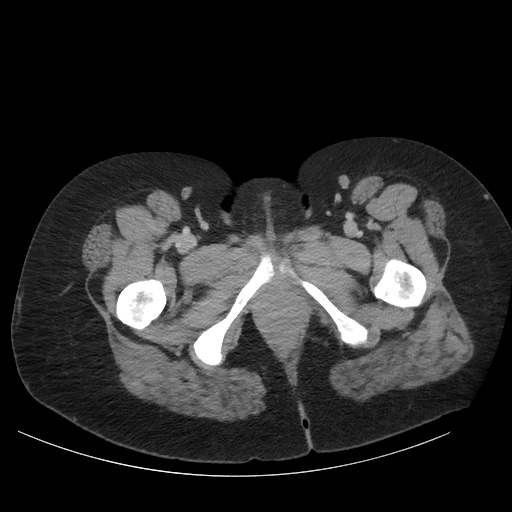
[im 20/92  soft-tissue]
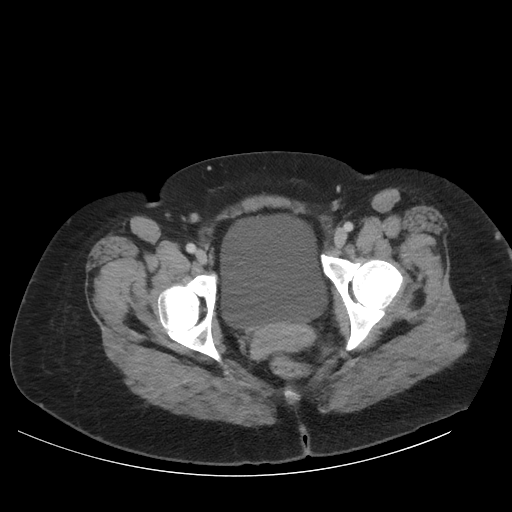
[im 24/92  soft-tissue]
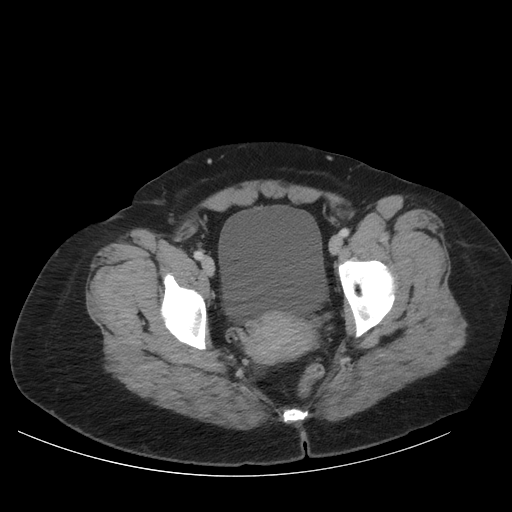
[im 29/92  soft-tissue]
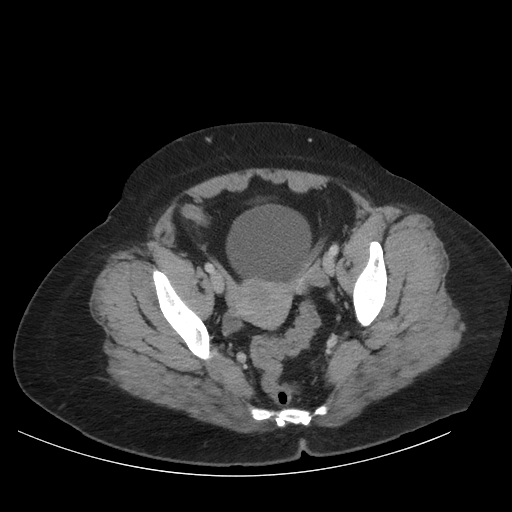
[im 39/92  soft-tissue]
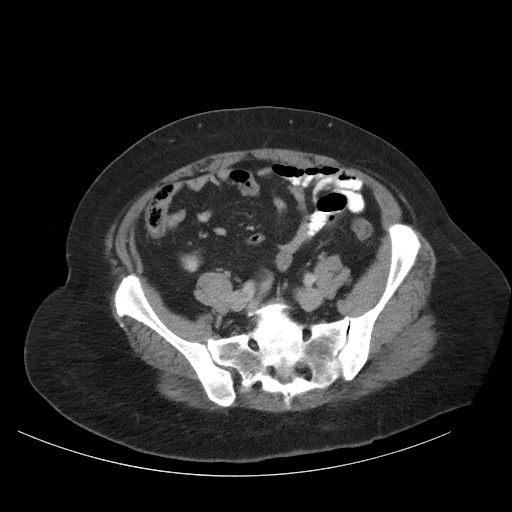
[im 44/92  soft-tissue]
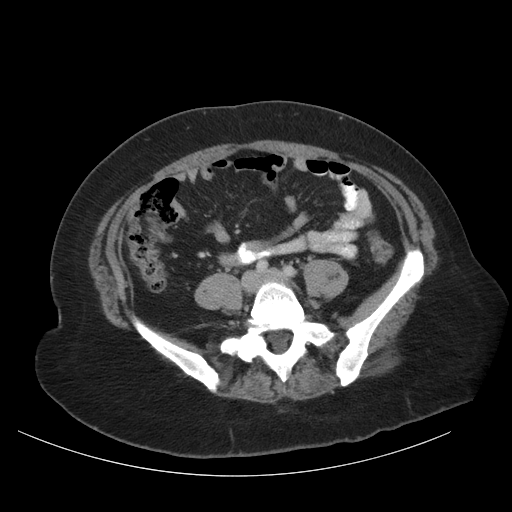
[im 48/92  soft-tissue]
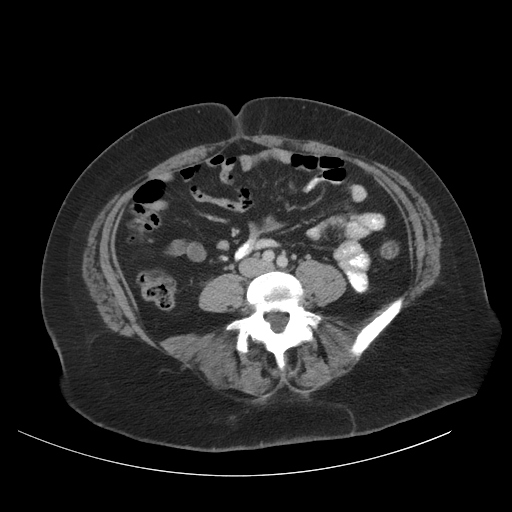
[im 53/92  soft-tissue]
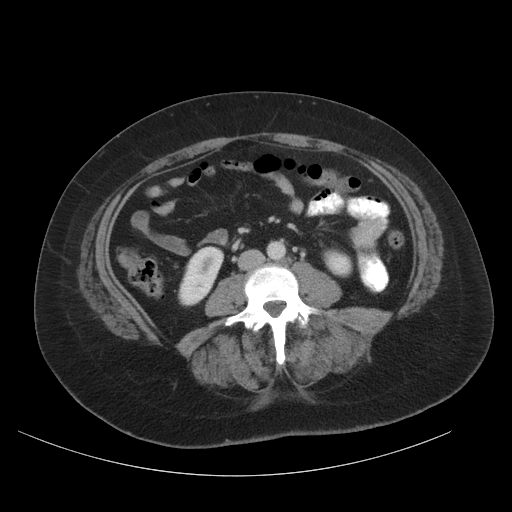
[im 53/92  bone]
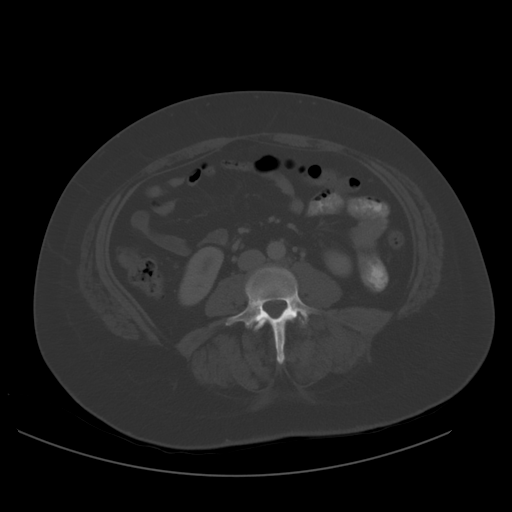
[im 63/92  soft-tissue]
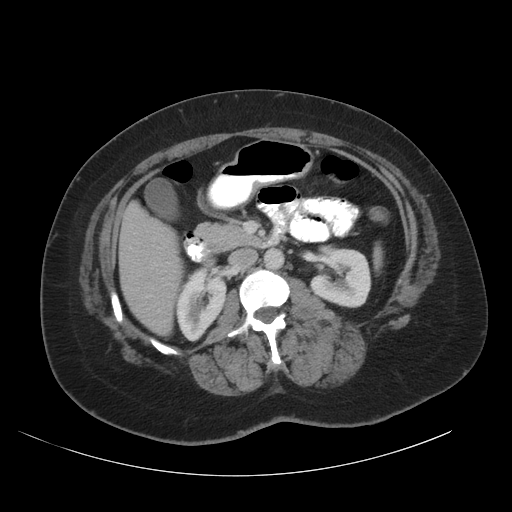
[im 68/92  soft-tissue]
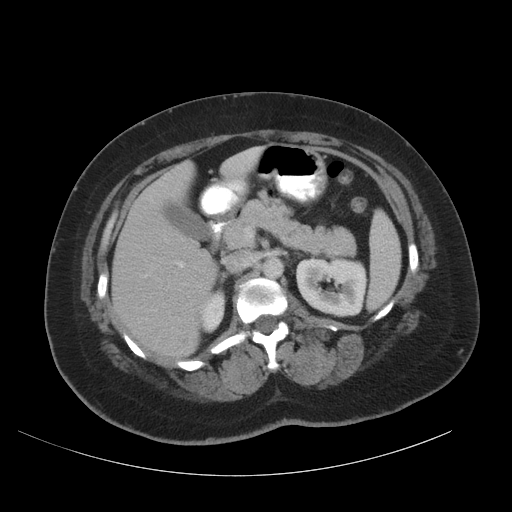
[im 72/92  soft-tissue]
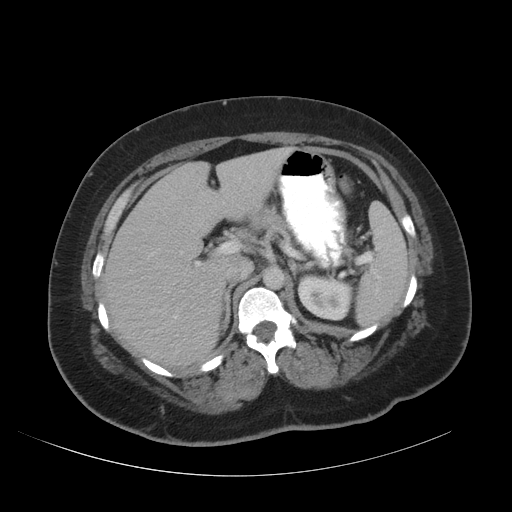
[im 82/92  soft-tissue]
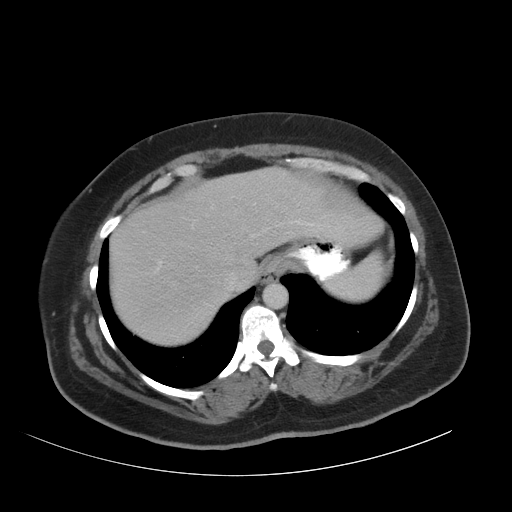
[im 87/92  soft-tissue]
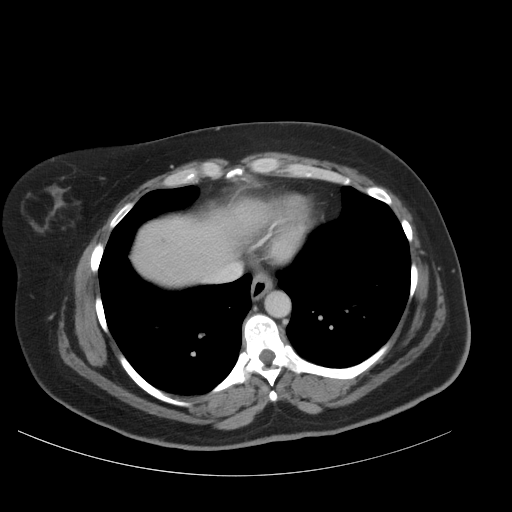

[Series 8040: mpr, coronals, coronal · coronal · 0.89mm/px · 3 of 126 slices shown]
[im 42/126  soft-tissue]
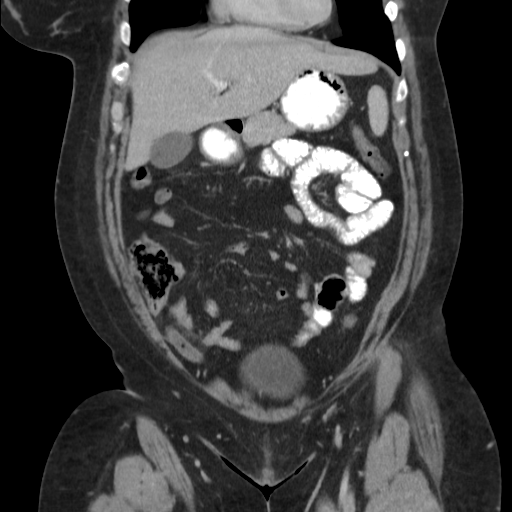
[im 56/126  soft-tissue]
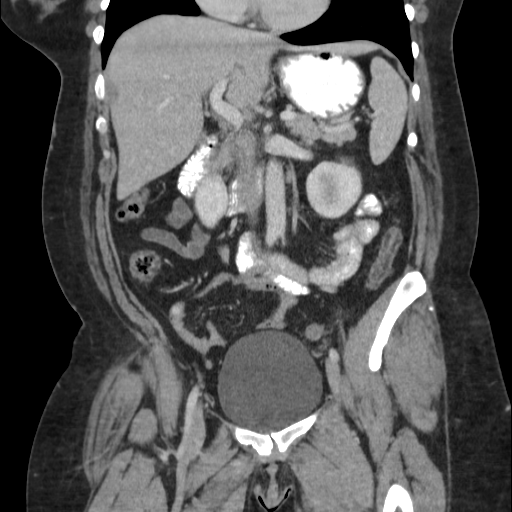
[im 70/126  soft-tissue]
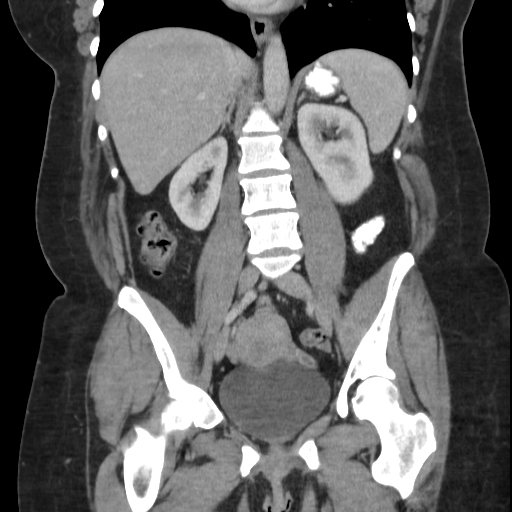

[17 of 46 positions shown; findings below may reference images not displayed]

FINDINGS: There is a 1.8 cm cyst in the left lobe liver.  With the
liver is otherwise normal.  The spleen, pancreas, gallbladder,
adrenal glands and kidneys are normal.  There is no hydronephrosis
bilaterally.  The aorta is normal.  There is no abdominal
lymphadenopathy.

The appendix is enlarged with surrounding inflammation consistent
with appendicitis.  There is no small bowel obstruction or
diverticulitis.

Fluid filled bladder is normal.  The uterus is normal.  There are
small cysts  in normal size bilateral ovaries.  There is minimal
dependent atelectasis of the posterior lung bases.Degenerative
joint changes of the spine are noted.
IMPRESSION: Acute appendicitis.

## 2019-10-22 ENCOUNTER — Ambulatory Visit: Payer: Self-pay | Attending: Internal Medicine

## 2022-04-22 ENCOUNTER — Other Ambulatory Visit: Payer: Self-pay

## 2022-04-22 ENCOUNTER — Encounter (HOSPITAL_COMMUNITY): Payer: Self-pay

## 2022-04-22 ENCOUNTER — Emergency Department (HOSPITAL_COMMUNITY): Payer: 59

## 2022-04-22 ENCOUNTER — Inpatient Hospital Stay (HOSPITAL_COMMUNITY)
Admission: EM | Admit: 2022-04-22 | Discharge: 2022-04-27 | DRG: 432 | Disposition: A | Discharge status: Home / Self Care | Payer: 59 | Attending: Internal Medicine | Admitting: Internal Medicine

## 2022-04-22 DIAGNOSIS — T380X5A Adverse effect of glucocorticoids and synthetic analogues, initial encounter: Secondary | ICD-10-CM | POA: Diagnosis present

## 2022-04-22 DIAGNOSIS — D539 Nutritional anemia, unspecified: Secondary | ICD-10-CM | POA: Diagnosis present

## 2022-04-22 DIAGNOSIS — K828 Other specified diseases of gallbladder: Secondary | ICD-10-CM | POA: Diagnosis present

## 2022-04-22 DIAGNOSIS — K7200 Acute and subacute hepatic failure without coma: Principal | ICD-10-CM

## 2022-04-22 DIAGNOSIS — E876 Hypokalemia: Secondary | ICD-10-CM | POA: Diagnosis not present

## 2022-04-22 DIAGNOSIS — R600 Localized edema: Secondary | ICD-10-CM | POA: Diagnosis present

## 2022-04-22 DIAGNOSIS — K7010 Alcoholic hepatitis without ascites: Principal | ICD-10-CM | POA: Diagnosis present

## 2022-04-22 DIAGNOSIS — D638 Anemia in other chronic diseases classified elsewhere: Secondary | ICD-10-CM | POA: Diagnosis present

## 2022-04-22 DIAGNOSIS — K709 Alcoholic liver disease, unspecified: Secondary | ICD-10-CM | POA: Diagnosis not present

## 2022-04-22 DIAGNOSIS — J9811 Atelectasis: Secondary | ICD-10-CM | POA: Diagnosis present

## 2022-04-22 DIAGNOSIS — R21 Rash and other nonspecific skin eruption: Secondary | ICD-10-CM | POA: Diagnosis present

## 2022-04-22 DIAGNOSIS — Z79899 Other long term (current) drug therapy: Secondary | ICD-10-CM

## 2022-04-22 DIAGNOSIS — E871 Hypo-osmolality and hyponatremia: Secondary | ICD-10-CM | POA: Diagnosis present

## 2022-04-22 DIAGNOSIS — Z7189 Other specified counseling: Secondary | ICD-10-CM

## 2022-04-22 DIAGNOSIS — E669 Obesity, unspecified: Secondary | ICD-10-CM | POA: Diagnosis present

## 2022-04-22 DIAGNOSIS — F1010 Alcohol abuse, uncomplicated: Secondary | ICD-10-CM | POA: Diagnosis present

## 2022-04-22 DIAGNOSIS — R7989 Other specified abnormal findings of blood chemistry: Secondary | ICD-10-CM | POA: Diagnosis present

## 2022-04-22 DIAGNOSIS — Z66 Do not resuscitate: Secondary | ICD-10-CM | POA: Diagnosis not present

## 2022-04-22 DIAGNOSIS — R531 Weakness: Secondary | ICD-10-CM | POA: Diagnosis not present

## 2022-04-22 DIAGNOSIS — E8809 Other disorders of plasma-protein metabolism, not elsewhere classified: Secondary | ICD-10-CM | POA: Diagnosis not present

## 2022-04-22 DIAGNOSIS — F321 Major depressive disorder, single episode, moderate: Secondary | ICD-10-CM

## 2022-04-22 DIAGNOSIS — E1165 Type 2 diabetes mellitus with hyperglycemia: Secondary | ICD-10-CM | POA: Diagnosis present

## 2022-04-22 DIAGNOSIS — R17 Unspecified jaundice: Secondary | ICD-10-CM | POA: Diagnosis present

## 2022-04-22 DIAGNOSIS — Z811 Family history of alcohol abuse and dependence: Secondary | ICD-10-CM

## 2022-04-22 DIAGNOSIS — Y900 Blood alcohol level of less than 20 mg/100 ml: Secondary | ICD-10-CM | POA: Diagnosis present

## 2022-04-22 DIAGNOSIS — Z9049 Acquired absence of other specified parts of digestive tract: Secondary | ICD-10-CM

## 2022-04-22 DIAGNOSIS — R9431 Abnormal electrocardiogram [ECG] [EKG]: Secondary | ICD-10-CM | POA: Diagnosis present

## 2022-04-22 DIAGNOSIS — R443 Hallucinations, unspecified: Secondary | ICD-10-CM | POA: Diagnosis not present

## 2022-04-22 DIAGNOSIS — F419 Anxiety disorder, unspecified: Secondary | ICD-10-CM | POA: Diagnosis present

## 2022-04-22 DIAGNOSIS — K760 Fatty (change of) liver, not elsewhere classified: Secondary | ICD-10-CM | POA: Diagnosis present

## 2022-04-22 DIAGNOSIS — Z6837 Body mass index (BMI) 37.0-37.9, adult: Secondary | ICD-10-CM

## 2022-04-22 DIAGNOSIS — I781 Nevus, non-neoplastic: Secondary | ICD-10-CM | POA: Diagnosis present

## 2022-04-22 DIAGNOSIS — K852 Alcohol induced acute pancreatitis without necrosis or infection: Secondary | ICD-10-CM | POA: Diagnosis present

## 2022-04-22 DIAGNOSIS — Z6372 Alcoholism and drug addiction in family: Secondary | ICD-10-CM

## 2022-04-22 LAB — COMPREHENSIVE METABOLIC PANEL
ALT: 125 U/L — ABNORMAL HIGH (ref 0–44)
AST: 316 U/L — ABNORMAL HIGH (ref 15–41)
Albumin: 2.8 g/dL — ABNORMAL LOW (ref 3.5–5.0)
Alkaline Phosphatase: 117 U/L (ref 38–126)
Anion gap: 13 (ref 5–15)
BUN: 38 mg/dL — ABNORMAL HIGH (ref 6–20)
CO2: 25 mmol/L (ref 22–32)
Calcium: 9.2 mg/dL (ref 8.9–10.3)
Chloride: 82 mmol/L — ABNORMAL LOW (ref 98–111)
Creatinine, Ser: 0.93 mg/dL (ref 0.44–1.00)
GFR, Estimated: >60 mL/min (ref >60)
Glucose, Bld: 125 mg/dL — ABNORMAL HIGH (ref 70–99)
Potassium: 3.3 mmol/L — ABNORMAL LOW (ref 3.5–5.1)
Sodium: 120 mmol/L — ABNORMAL LOW (ref 135–145)
Total Bilirubin: 27.9 mg/dL (ref 0.3–1.2)
Total Protein: 7.1 g/dL (ref 6.5–8.1)

## 2022-04-22 LAB — CBC
HCT: 27 % — ABNORMAL LOW (ref 36.0–46.0)
Hemoglobin: 10.1 g/dL — ABNORMAL LOW (ref 12.0–15.0)
MCH: 39.1 pg — ABNORMAL HIGH (ref 26.0–34.0)
MCHC: 37.4 g/dL — ABNORMAL HIGH (ref 30.0–36.0)
MCV: 104.7 fL — ABNORMAL HIGH (ref 80.0–100.0)
Platelets: 174 10*3/uL (ref 150–400)
RBC: 2.58 MIL/uL — ABNORMAL LOW (ref 3.87–5.11)
RDW: 18.1 % — ABNORMAL HIGH (ref 11.5–15.5)
WBC: 11.3 10*3/uL — ABNORMAL HIGH (ref 4.0–10.5)
nRBC: 0 % (ref 0.0–0.2)

## 2022-04-22 LAB — I-STAT BETA HCG BLOOD, ED (MC, WL, AP ONLY): I-stat hCG, quantitative: <5 m[IU]/mL (ref <5)

## 2022-04-22 LAB — AMMONIA: Ammonia: 27 umol/L (ref 9–35)

## 2022-04-22 LAB — LACTIC ACID, PLASMA: Lactic Acid, Venous: 1.5 mmol/L (ref 0.5–1.9)

## 2022-04-22 LAB — PROTIME-INR
INR: 1.6 — ABNORMAL HIGH (ref 0.8–1.2)
Prothrombin Time: 19.1 seconds — ABNORMAL HIGH (ref 11.4–15.2)

## 2022-04-22 LAB — SALICYLATE LEVEL: Salicylate Lvl: 11.4 mg/dL (ref 7.0–30.0)

## 2022-04-22 LAB — ACETAMINOPHEN LEVEL: Acetaminophen (Tylenol), Serum: <10 ug/mL — ABNORMAL LOW (ref 10–30)

## 2022-04-22 LAB — LIPASE, BLOOD: Lipase: 67 U/L — ABNORMAL HIGH (ref 11–51)

## 2022-04-22 MED ORDER — LORAZEPAM 1 MG PO TABS: 1.0000 mg | ORAL_TABLET | ORAL | Status: AC | PRN

## 2022-04-22 MED ORDER — LORAZEPAM 2 MG/ML IJ SOLN
0.0000 mg | Freq: Two times a day (BID) | INTRAMUSCULAR | Status: AC
  Administered 2022-04-25: 2 mg via INTRAVENOUS
  Filled 2022-04-22: qty 1

## 2022-04-22 MED ORDER — FOLIC ACID 1 MG PO TABS
1.0000 mg | ORAL_TABLET | Freq: Every day | ORAL | Status: DC
  Administered 2022-04-23 – 2022-04-27 (×5): 1 mg via ORAL
  Filled 2022-04-22 (×5): qty 1

## 2022-04-22 MED ORDER — LORAZEPAM 2 MG/ML IJ SOLN: 0.0000 mg | Freq: Four times a day (QID) | INTRAMUSCULAR | Status: AC

## 2022-04-22 MED ORDER — ALBUMIN HUMAN 25 % IV SOLN
12.5000 g | Freq: Once | INTRAVENOUS | Status: AC
  Administered 2022-04-23: 12.5 g via INTRAVENOUS
  Filled 2022-04-22: qty 50

## 2022-04-22 MED ORDER — SODIUM CHLORIDE 0.9 % IV SOLN: INTRAVENOUS | Status: DC

## 2022-04-22 MED ORDER — THIAMINE MONONITRATE 100 MG PO TABS
100.0000 mg | ORAL_TABLET | Freq: Every day | ORAL | Status: DC
  Administered 2022-04-23 – 2022-04-27 (×5): 100 mg via ORAL
  Filled 2022-04-22 (×5): qty 1

## 2022-04-22 MED ORDER — ADULT MULTIVITAMIN W/MINERALS CH
1.0000 | ORAL_TABLET | Freq: Every day | ORAL | Status: DC
  Administered 2022-04-23 – 2022-04-27 (×5): 1 via ORAL
  Filled 2022-04-22 (×5): qty 1

## 2022-04-22 MED ORDER — LORAZEPAM 2 MG/ML IJ SOLN: 1.0000 mg | INTRAMUSCULAR | Status: AC | PRN

## 2022-04-22 MED ORDER — THIAMINE HCL 100 MG/ML IJ SOLN
100.0000 mg | Freq: Every day | INTRAMUSCULAR | Status: DC
  Filled 2022-04-22: qty 2

## 2022-04-22 NOTE — Subjective & Objective (Signed)
Weakness, fatigue, body aches for a week  Undergone detox from [facility] 3 wks ago Now notes jaundice and leg swelling Reports SOB At baseline drinks 1.5 L of wine per day Last [facility] was 3 wks ago Did so by herself at home  No seizures

## 2022-04-22 NOTE — ED Provider Triage Note (Signed)
Emergency Medicine Provider Triage Evaluation Note  Deanna Mcmillan , a 51 y.o. female  was evaluated in triage.  Pt with several complaints. Finished alcohol detox 3 weeks ago. Been complaining of generalized weakness and unsteadiness for 1 week, getting progressively worse. Noticed a rash on her chest, back and arms. Bilateral leg swelling x 2 days. Her son noticed yellowing of her eyes and skin the past several days.   Review of Systems  Positive: As above, constipation Negative: Nausea, vomiting, diarrhea, fever  Physical Exam  There were no vitals taken for this visit. Gen:   Awake, no distress   Resp:  Normal effort  MSK:   Moves extremities without difficulty  Other:  Bilateral leg swelling, yellowing of the skin with scleral icterus  Medical Decision Making  Medically screening exam initiated at 9:00 PM.  Appropriate orders placed.  Deanna Mcmillan was informed that the remainder of the evaluation will be completed by another provider, this initial triage assessment does not replace that evaluation, and the importance of remaining in the ED until their evaluation is complete.  Workup initiated   Deanna Ishmael T, PA-C 04/22/22 2100

## 2022-04-22 NOTE — ED Triage Notes (Signed)
Pt BIB [facility] with reports of generalized weakness x 1 week, alcohol detox about 3 weeks ago. Pt has a rash to her chest, back, and arms x 2 days ago. Pt has yellowing of her eyes and skin x 2 days. Pt has swelling to both lower extremities.

## 2022-04-22 NOTE — H&P (Signed)
[name] CWC:[id number] DOB: [birth year] DOA: 04/22/2022     PCP: Patient, No Pcp Per   Outpatient Specialists:     Patient arrived to ER on 04/22/22 at 2046 Referred by Attending Rancour, Annie Main, MD   Patient coming from:    home Lives alone,   *** With family    Chief Complaint:   Chief Complaint  Patient presents with   Weakness    HPI: Deanna Mcmillan is a 51 y.o. female with medical history significant of ETOH abuse    Presented with  jaundice and leg edema  Weakness, fatigue, body aches for a week  Undergone detox from etoh 3 wks ago Now notes jaundice and leg swelling Reports SOB At baseline drinks 1.5 L of wine per day Last etoh was 3 wks ago Did so by herself at home  No seizures   Regarding pertinent Chronic problems:    ****Hyperlipidemia - *on statins {statin:315258}  Lipid Panel  No results found for: "CHOL", "TRIG", "HDL", "CHOLHDL", "VLDL", "LDLCALC", "LDLDIRECT", "LABVLDL"  ***HTN on   ***chronic CHF diastolic/systolic/ combined - last echo***  *** CAD  - On Aspirin, statin, betablocker, Plavix                 - *followed by cardiology                - last cardiac cath  The ASCVD Risk score (Arnett DK, et al., 2019) failed to calculate for the following reasons:   Cannot find a previous HDL lab   Cannot find a previous total cholesterol lab    ***DM 2 - No results found for: "HGBA1C" ****on insulin, PO meds only, diet controlled  ***Hypothyroidism: No results found for: "TSH" on synthroid    obesity-   BMI Readings from Last 1 Encounters:  04/22/22 37.76 kg/m     *** Asthma -well *** controlled on home inhalers/ nebs f                        ***last no prior***admission  ***                       No ***history of intubation  *** COPD - not **followed by pulmonology *** not  on baseline oxygen  *L,    *** OSA -on nocturnal oxygen, *CPAP, *noncompliant with CPAP     Liver disease MELD 3.0: 28 at 04/22/2022  9:55 PM    Chronic anemia - baseline hg Hemoglobin & Hematocrit  Recent Labs    04/22/22 2140  HGB 10.1*    While in ER:   INR 1.6 Total bili 27 NA 120 Alb 2.8  Plts 174  CXR -  NON acute  [place] RUQ was ordered  CTabd/pelvis - ***nonacute  CTA chest - ***nonacute, no PE, * no evidence of infiltrate  Following Medications were ordered in ER: Medications  0.9 %  sodium chloride infusion ( Intravenous New Bag/Given 04/22/22 2309)  LORazepam (ATIVAN) tablet 1-4 mg (has no administration in time range)    Or  LORazepam (ATIVAN) injection 1-4 mg (has no administration in time range)  thiamine (VITAMIN B1) tablet 100 mg (has no administration in time range)    Or  thiamine (VITAMIN B1) injection 100 mg (has no administration in time range)  folic acid (FOLVITE) tablet 1 mg (has no administration in time range)  multivitamin with minerals tablet 1 tablet (has no administration in  time range)  LORazepam (ATIVAN) injection 0-4 mg ( Intravenous Not Given 04/22/22 2315)    Followed by  LORazepam (ATIVAN) injection 0-4 mg (has no administration in time range)    _______________________________________________________ ER Provider Called:  [facility]   Dr.Karki They Recommend admit to medicine  RUQ [place] Will see in AM      ED Triage Vitals  Enc Vitals Group     BP 04/22/22 2106 (!) 124/56     Pulse Rate 04/22/22 2106 89     Resp 04/22/22 2106 14     Temp 04/22/22 2106 98.2 F (36.8 C)     Temp Source 04/22/22 2106 Oral     SpO2 04/22/22 2106 94 %     Weight 04/22/22 2100 220 lb (99.8 kg)     Height 04/22/22 2100 5\' 4"  (1.626 m)     Head Circumference --      Peak Flow --      Pain Score 04/22/22 2100 0     Pain Loc --      Pain Edu? --      Excl. in [place]? --   TMAX(24)@     _________________________________________ Significant initial  Findings: Abnormal Labs Reviewed  LIPASE, BLOOD - Abnormal; Notable for the following components:      Result Value   Lipase 67 (*)    All other  components within normal limits  COMPREHENSIVE METABOLIC PANEL - Abnormal; Notable for the following components:   Sodium 120 (*)    Potassium 3.3 (*)    Chloride 82 (*)    Glucose, Bld 125 (*)    BUN 38 (*)    Albumin 2.8 (*)    AST 316 (*)    ALT 125 (*)    Total Bilirubin 27.9 (*)    All other components within normal limits  CBC - Abnormal; Notable for the following components:   WBC 11.3 (*)    RBC 2.58 (*)    Hemoglobin 10.1 (*)    HCT 27.0 (*)    MCV 104.7 (*)    MCH 39.1 (*)    MCHC 37.4 (*)    RDW 18.1 (*)    All other components within normal limits  PROTIME-INR - Abnormal; Notable for the following components:   Prothrombin Time 19.1 (*)    INR 1.6 (*)    All other components within normal limits  ACETAMINOPHEN LEVEL - Abnormal; Notable for the following components:   Acetaminophen (Tylenol), Serum <10 (*)    All other components within normal limits     ECG: Ordered Personally reviewed and interpreted by me showing: HR : 87 Rhythm: Sinus rhythm Low voltage, precordial leads Nonspecific T abnormalities, lateral leads Prolonged QT interval No previous ECGs available QTC 523     The recent clinical data is shown below. Vitals:   04/22/22 2100 04/22/22 2106 04/22/22 2300  BP:  (!) 124/56   Pulse:  89 86  Resp:  14   Temp:  98.2 F (36.8 C)   TempSrc:  Oral   SpO2:  94%   Weight: 99.8 kg    Height: 5\' 4"  (1.626 m)      WBC     Component Value Date/Time   WBC 11.3 (H) 04/22/2022 2140   LYMPHSABS 1.1 04/11/2013 1234   MONOABS 0.6 04/11/2013 1234   EOSABS 0.0 04/11/2013 1234   BASOSABS 0.0 04/11/2013 1234     Lactic Acid, Venous    Component Value Date/Time   LATICACIDVEN  1.5 04/22/2022 2155       UA *** no evidence of UTI  ***Pending ***not ordered   Urine analysis:    Component Value Date/Time   COLORURINE YELLOW 04/11/2013 1239   APPEARANCEUR CLOUDY (A) 04/11/2013 1239   LABSPEC 1.008 04/11/2013 1239   PHURINE 5.5 04/11/2013  1239   GLUCOSEU NEGATIVE 04/11/2013 1239   HGBUR NEGATIVE 04/11/2013 1239   BILIRUBINUR NEGATIVE 04/11/2013 1239   KETONESUR NEGATIVE 04/11/2013 1239   PROTEINUR NEGATIVE 04/11/2013 1239   UROBILINOGEN 0.2 04/11/2013 1239   NITRITE NEGATIVE 04/11/2013 1239   LEUKOCYTESUR NEGATIVE 04/11/2013 1239    Results for orders placed or performed during the hospital encounter of 04/11/13  Wet prep, genital     Status: None   Collection Time: 04/11/13  2:08 PM   Specimen: Genital  Result Value Ref Range Status   Yeast Wet Prep HPF POC NONE SEEN NONE SEEN Final   Trich, Wet Prep NONE SEEN NONE SEEN Final   Clue Cells Wet Prep HPF POC NONE SEEN NONE SEEN Final   WBC, Wet Prep HPF POC NONE SEEN NONE SEEN Final  GC/Chlamydia Probe Amp (multiple spec sources)     Status: None   Collection Time: 04/11/13  2:08 PM  Result Value Ref Range Status   CT Probe RNA NEGATIVE NEGATIVE Final   GC Probe RNA NEGATIVE NEGATIVE Final    Comment: (NOTE)                                                                                      ***Normal Reference Range: Negative***      Assay performed using the Gen-Probe APTIMA COMBO2 (R) Assay. Acceptable specimen types for this assay include APTIMA Swabs (Unisex, endocervical, urethral, or vaginal), first void urine, and ThinPrep liquid based cytology samples. Performed at [facility]     _______________________________________________ Hospitalist was called for admission for  jaundice in the setting of alcohol abuse   The following Work up has been ordered so far:  Orders Placed This Encounter  Procedures   DG Chest Portable 1 View   US Abdomen Limited RUQ (LIVER/GB)   Lipase, blood   Comprehensive metabolic panel   CBC   Urinalysis, Routine w reflex microscopic   Lactic acid, plasma   Ammonia   Protime-INR   Rapid urine drug screen (hospital performed)   Acetaminophen level   Salicylate level   Hepatitis panel, acute   Diet NPO time  specified   Cardiac monitoring   Vital signs every 6 hours X 48 hours, then per unit protocol   Refer to Sidebar Report for reference: [place] (CIWA)   If Ativan given, reassess [facility]drawal [facility] (CIWA) q 1 hour   Notify Pharmacy to change IV Ativan to PO if tolerating POs well.   Notify physician (specify)   Consult to gastroenterology   Consult to hospitalist   Pulse oximetry, continuous   I-Stat beta hCG blood, ED   EKG 12-Lead     OTHER Significant initial  Findings:  labs showing:    Recent Labs  Lab 04/22/22 2140  NA 120*  K 3.3*  CO2 25  GLUCOSE 125*  BUN 38*  CREATININE 0.93  CALCIUM 9.2    Cr    Up from baseline see below Lab Results  Component Value Date   CREATININE 0.93 04/22/2022   CREATININE 0.68 04/12/2013   CREATININE 0.68 04/11/2013    Recent Labs  Lab 04/22/22 2140  AST 316*  ALT 125*  ALKPHOS 117  BILITOT 27.9*  PROT 7.1  ALBUMIN 2.8*   Lab Results  Component Value Date   CALCIUM 9.2 04/22/2022       Plt: Lab Results  Component Value Date   PLT 174 04/22/2022      Arterial ***Venous  Blood Gas result:  pH *** pCO2 ***; pO2 ***;     %O2 Sat ***.  ABG No results found for: "PHART", "PCO2ART", "PO2ART", "HCO3", "TCO2", "ACIDBASEDEF", "O2SAT"       Recent Labs  Lab 04/22/22 2140  WBC 11.3*  HGB 10.1*  HCT 27.0*  MCV 104.7*  PLT 174    HG/HCT  stable,      Component Value Date/Time   HGB 10.1 (L) 04/22/2022 2140   HCT 27.0 (L) 04/22/2022 2140   MCV 104.7 (H) 04/22/2022 2140      Recent Labs  Lab 04/22/22 2140  LIPASE 67*   Recent Labs  Lab 04/22/22 2155  AMMONIA 27      Cardiac Panel (last 3 results) No results for input(s): "CKTOTAL", "CKMB", "TROPONINI", "RELINDX" in the last 72 hours.  .car BNP (last 3 results) No results for input(s): "BNP" in the last 8760 hours.    DM  labs:  HbA1C: No results for  input(s): "HGBA1C" in the last 8760 hours.     CBG (last 3)  No results for input(s): "GLUCAP" in the last 72 hours.        Cultures: No results found for: "SDES", "SPECREQUEST", "CULT", "REPTSTATUS"   Radiological Exams on Admission: DG Chest Portable 1 View  Result Date: 04/22/2022 CLINICAL DATA:  Generalized weakness EXAM: PORTABLE CHEST 1 VIEW COMPARISON:  None Available. FINDINGS: Cardiac shadow is within normal limits. Mild bibasilar atelectasis is seen. No other focal infiltrate is noted. No bony abnormality is seen. IMPRESSION: Mild bibasilar atelectasis. Electronically Signed   By: Inez Catalina M.D.   On: 04/22/2022 22:14   _______________________________________________________________________________________________________ Latest  Blood pressure (!) 124/56, pulse 86, temperature 98.2 F (36.8 C), temperature source Oral, resp. rate 14, height 5\' 4"  (1.626 m), weight 99.8 kg, SpO2 94 %.   Vitals  labs and radiology finding personally reviewed  Review of Systems:    Pertinent positives include:   fatigue,   Constitutional:  No weight loss, night sweats, Fevers, chills,weight loss  HEENT:  No headaches, Difficulty swallowing,Tooth/dental problems,Sore throat,  No sneezing, itching, ear ache, nasal congestion, post nasal drip,  Cardio-vascular:  No chest pain, Orthopnea, PND, anasarca, dizziness, palpitations.no Bilateral lower extremity swelling  GI:  No heartburn, indigestion, abdominal pain, nausea, vomiting, diarrhea, change in bowel habits, loss of appetite, melena, blood in stool, hematemesis Resp:  no shortness of breath at rest. No dyspnea on exertion, No excess mucus, no productive cough, No non-productive cough, No coughing up of blood.No change in color of mucus.No wheezing. Skin:  no rash or lesions. No jaundice GU:  no dysuria, change in color of urine, no urgency or frequency. No straining to urinate.  No flank pain.  Musculoskeletal:  No joint pain or  no joint swelling. No decreased range of motion. No back  pain.  Psych:  No change in mood or affect. No depression or anxiety. No memory loss.  Neuro: no localizing neurological complaints, no tingling, no weakness, no double vision, no gait abnormality, no slurred speech, no confusion  All systems reviewed and apart from [place] all are negative _______________________________________________________________________________________________ Past Medical History:  History reviewed. No pertinent past medical history.    Past Surgical History:  Procedure Laterality Date   KNEE ARTHROSCOPY W/ ACL RECONSTRUCTION Left 1994/5   Dr. Weston Anna   LAPAROSCOPIC APPENDECTOMY N/A 04/11/2013   Procedure: APPENDECTOMY LAPAROSCOPIC;  Surgeon: Stark Klein, MD;  Location: [facility];  Service: General;  Laterality: N/A;   [place]   3 extractions    Social History:  Ambulatory *** independently cane, walker  wheelchair bound, bed bound     reports that she has never smoked. She does not have any smokeless tobacco history on file. She reports current alcohol use. She reports that she does not use drugs.     Family History:   History reviewed. No pertinent family history. ______________________________________________________________________________________________ Allergies: No Known Allergies   Prior to Admission medications   Medication Sig Start Date End Date Taking? Authorizing Provider  acetaminophen (TYLENOL) 500 MG tablet Take 500 mg by mouth every 6 (six) hours as needed for headache or moderate pain.   Yes [provider]  oxyCODONE-acetaminophen (PERCOCET/ROXICET) 5-325 MG per tablet Take 1-2 tablets by mouth every 4 (four) hours as needed. Patient not taking: Reported on 04/22/2022 04/12/13   Jackolyn Confer, MD    ___________________________________________________________________________________________________ Physical Exam:    04/22/2022   11:00 PM  04/22/2022    9:06 PM 04/22/2022    9:00 PM  Vitals with BMI  Height   5\' 4"   Weight   220 lbs  BMI   44.31  Systolic  540   Diastolic  56   Pulse 86 89      1. General:  in No ***Acute distress***increased work of breathing ***complaining of severe pain****agitated * Chronically ill *well *cachectic *toxic acutely ill -appearing 2. Psychological: Alert and *** Oriented 3. Head/ENT:   Moist *** Dry Mucous Membranes                          Head Non traumatic, neck supple                          Normal *** Poor Dentition 4. SKIN: normal *** decreased Skin turgor,  Skin clean Dry and intact no rash 5. Heart: Regular rate and rhythm no*** Murmur, no Rub or gallop 6. Lungs: ***Clear to auscultation bilaterally, no wheezes or crackles   7. Abdomen: Soft, ***non-tender, Non distended *** obese ***bowel sounds present 8. Lower extremities: no clubbing, cyanosis, no ***edema 9. Neurologically Grossly intact, moving all 4 extremities equally *** strength 5 out of 5 in all 4 extremities cranial nerves II through XII intact 10. MSK: Normal range of motion    Chart has been reviewed  ______________________________________________________________________________________________  Assessment/Plan  ***  Admitted for *** There are no diagnoses linked to this encounter.   Present on Admission: **None**     No problem-specific Assessment & Plan notes found for this encounter.    Other plan as per orders.  DVT prophylaxis:  SCD *** Lovenox       Code Status:    Code Status: Prior FULL CODE *** DNR/DNI ***comfort care as per  patient ***family  I had personally discussed CODE STATUS with patient and family* I had spent *min discussing goals of care and CODE STATUS    Family Communication:   Family not at  Bedside  plan of care was discussed on the phone with *** Son, Daughter, Wife, Husband, Sister, Brother , father, mother  Disposition Plan:   *** likely will need placement  for rehabilitation                          Back to current facility when stable                            To home once workup is complete and patient is stable  ***Following barriers for discharge:                            Electrolytes corrected                               Anemia corrected                             Pain controlled with PO medications                               Afebrile, white count improving able to transition to PO antibiotics                             Will need to be able to tolerate PO                            Will likely need home health, home O2, set up                           Will need consultants to evaluate patient prior to discharge  ****EXPECT DC tomorrow                    ***Would benefit from PT/OT eval prior to DC  Ordered                   Swallow eval - SLP ordered                   Diabetes care coordinator                   Transition of care consulted                   Nutrition    consulted                  Wound care  consulted                   Palliative care    consulted                   Behavioral health  consulted                    Consults called: ***    Admission status:  ED Disposition     None        Obs***  ***  inpatient     I Expect 2 midnight stay secondary to severity of patient's current illness need for inpatient interventions justified by the following: ***hemodynamic instability despite optimal treatment (tachycardia *hypotension * tachypnea *hypoxia, hypercapnia) * Severe lab/radiological/exam abnormalities including:     and extensive comorbidities including: *substance abuse  *Chronic pain *DM2  * CHF * CAD  * COPD/asthma *Morbid Obesity * CKD *dementia *liver disease *history of stroke with residual deficits *  malignancy, * sickle cell disease  History of amputation Chronic anticoagulation  That are currently affecting medical management.   I expect  patient to be hospitalized  for 2 midnights requiring inpatient medical care.  Patient is at high risk for adverse outcome (such as loss of life or disability) if not treated.  Indication for inpatient stay as follows:  Severe change from baseline regarding mental status Hemodynamic instability despite maximal medical therapy,  ongoing suicidal ideations,  severe pain requiring acute inpatient management,  inability to maintain oral hydration   persistent chest pain despite medical management Need for operative/procedural  intervention New or worsening hypoxia   Need for IV antibiotics, IV fluids, IV rate controling medications, IV antihypertensives, IV pain medications, IV anticoagulation, need for biPAP    Level of care   *** tele  For 12H 24H     medical floor       progressive tele indefinitely please discontinue once patient no longer qualifies COVID-19 Labs    No results found for: "SARSCOV2NAA"   Precautions: admitted as *** Covid Negative  ***asymptomatic screening protocol****PUI *** covid positive No active isolations ***If Covid PCR is negative  - please DC precautions - would need additional investigation given very high risk for false native test result       Arik Husmann 04/22/2022, 11:23 PM ***  [facility]     after 2 AM please page floor coverage PA If 7AM-7PM, please contact the day team taking care of the patient using [email or web address]   Patient was evaluated in the context of the global COVID-19 pandemic, which necessitated consideration that the patient might be at risk for infection with the SARS-CoV-2 virus that causes COVID-19. Institutional protocols and algorithms that pertain to the evaluation of patients at risk for COVID-19 are in a state of rapid change based on information released by regulatory bodies including the CDC and federal and state organizations. These policies and algorithms were followed during the patient's care.

## 2022-04-22 NOTE — ED Provider Notes (Signed)
[facility] DEPT Provider Note   CSN: [id number] Arrival date & time: 04/22/22  2046     History {Add pertinent medical, surgical, social history, OB history to HPI:1} Chief Complaint  Patient presents with   Weakness    Deanna Mcmillan is a 51 y.o. female.  Patient presents with a 1 week history of generalized weakness, fatigue, body aches.  She is also noticed increased yellowing of her skin and eyes over the past 2 to 3 days as well as lower extremity edema.  Does have some subjective shortness of breath.  She detoxed herself from alcohol about 3 weeks ago.  She was drinking 1.5 L of red wine daily.  She stopped drinking completely 3 weeks ago and went through detox herself with nausea and vomiting but denies any history of seizures.  Denies any drug use other than alcohol.  She states over the past 2 or 3 days she has noticed yellowing of her skin in her eyes.  Has had red rash involving her chest, arms and back.  She has not noticed lower extremity swelling and feels generally weak with difficulty walking but no dizziness or room spinning sensation.  The history is provided by the patient.  Weakness Associated symptoms: no arthralgias, no chest pain, no cough, no dizziness, no dysuria, no fever, no myalgias, no nausea, no shortness of breath and no vomiting        Home Medications Prior to Admission medications   Medication Sig Start Date End Date Taking? Authorizing Provider  naproxen sodium (ANAPROX) 220 MG tablet Take 220 mg by mouth as needed (for headaches.).    [provider]  OMEPRAZOLE PO Take 1 tablet by mouth daily as needed (for acid reflux.).    [provider]  oxyCODONE-acetaminophen (PERCOCET/ROXICET) 5-325 MG per tablet Take 1-2 tablets by mouth every 4 (four) hours as needed. 04/12/13   Jackolyn Confer, MD      Allergies    Patient has no known allergies.    Review of Systems   Review of Systems   Constitutional:  Positive for activity change, appetite change and fatigue. Negative for fever.  HENT:  Negative for congestion and rhinorrhea.   Respiratory:  Negative for cough, chest tightness and shortness of breath.   Cardiovascular:  Positive for leg swelling. Negative for chest pain.  Gastrointestinal:  Negative for nausea and vomiting.  Genitourinary:  Negative for dysuria and hematuria.  Musculoskeletal:  Negative for arthralgias and myalgias.  Neurological:  Positive for weakness and light-headedness. Negative for dizziness.   all other systems are negative except as noted in the HPI and PMH.    Physical Exam Updated Vital Signs BP (!) 124/56   Pulse 89   Temp 98.2 F (36.8 C) (Oral)   Resp 14   Ht 5\' 4"  (1.626 m)   Wt 99.8 kg   SpO2 94%   BMI 37.76 kg/m  Physical Exam Vitals and nursing note reviewed.  Constitutional:      General: She is not in acute distress.    Appearance: She is well-developed.     Comments: Jaundiced  HENT:     Head: Normocephalic and atraumatic.     Mouth/Throat:     Pharynx: No oropharyngeal exudate.  Eyes:     General: Scleral icterus present.     Conjunctiva/sclera: Conjunctivae normal.     Pupils: Pupils are equal, round, and reactive to light.  Neck:     Comments: No meningismus. Cardiovascular:  Rate and Rhythm: Normal rate and regular rhythm.     Heart sounds: Normal heart sounds. No murmur heard. Pulmonary:     Effort: Pulmonary effort is normal. No respiratory distress.     Breath sounds: Normal breath sounds.  Abdominal:     Palpations: Abdomen is soft.     Tenderness: There is no abdominal tenderness. There is no guarding or rebound.  Musculoskeletal:        General: No tenderness. Normal range of motion.     Cervical back: Normal range of motion and neck supple.     Right lower leg: Edema present.     Left lower leg: Edema present.  Skin:    General: Skin is warm.     Findings: Rash present.     Comments:  Scattered telangiectasias involving arms, chest, back  Neurological:     Mental Status: She is alert and oriented to person, place, and time.     Cranial Nerves: No cranial nerve deficit.     Motor: No abnormal muscle tone.     Coordination: Coordination normal.     Comments:  5/5 strength throughout. CN 2-12 intact.Equal grip strength.   Psychiatric:        Behavior: Behavior normal.     ED Results / Procedures / Treatments   Labs (all labs ordered are listed, but only abnormal results are displayed) Labs Reviewed  LIPASE, BLOOD - Abnormal; Notable for the following components:      Result Value   Lipase 67 (*)    All other components within normal limits  COMPREHENSIVE METABOLIC PANEL - Abnormal; Notable for the following components:   Sodium 120 (*)    Potassium 3.3 (*)    Chloride 82 (*)    Glucose, Bld 125 (*)    BUN 38 (*)    Albumin 2.8 (*)    AST 316 (*)    ALT 125 (*)    Total Bilirubin 27.9 (*)    All other components within normal limits  CBC - Abnormal; Notable for the following components:   WBC 11.3 (*)    RBC 2.58 (*)    Hemoglobin 10.1 (*)    HCT 27.0 (*)    MCV 104.7 (*)    MCH 39.1 (*)    MCHC 37.4 (*)    RDW 18.1 (*)    All other components within normal limits  PROTIME-INR - Abnormal; Notable for the following components:   Prothrombin Time 19.1 (*)    INR 1.6 (*)    All other components within normal limits  ACETAMINOPHEN LEVEL - Abnormal; Notable for the following components:   Acetaminophen (Tylenol), Serum <10 (*)    All other components within normal limits  LACTIC ACID, PLASMA  AMMONIA  SALICYLATE LEVEL  URINALYSIS, ROUTINE W REFLEX MICROSCOPIC  LACTIC ACID, PLASMA  RAPID URINE DRUG SCREEN, HOSP PERFORMED  HEPATITIS PANEL, ACUTE  I-STAT BETA HCG BLOOD, ED ([facility], [facility], [facility] ONLY)    EKG EKG Interpretation  Date/Time:  Sunday April 22 2022 23:03:02 EDT Ventricular Rate:  87 PR Interval:  132 QRS Duration: 108 QT Interval:  434 QTC  Calculation: 523 R Axis:   68 Text Interpretation: Sinus rhythm Low voltage, precordial leads Nonspecific T abnormalities, lateral leads Prolonged QT interval No previous ECGs available Confirmed by Ezequiel Essex [phone]) on 04/22/2022 11:04:48 PM  Radiology DG Chest Portable 1 View  Result Date: 04/22/2022 CLINICAL DATA:  Generalized weakness EXAM: PORTABLE CHEST 1 VIEW COMPARISON:  None Available.  FINDINGS: Cardiac shadow is within normal limits. Mild bibasilar atelectasis is seen. No other focal infiltrate is noted. No bony abnormality is seen. IMPRESSION: Mild bibasilar atelectasis. Electronically Signed   By: Inez Catalina M.D.   On: 04/22/2022 22:14    Procedures Procedures  {Document cardiac monitor, telemetry assessment procedure when appropriate:1}  Medications Ordered in ED Medications - No data to display  ED Course/ Medical Decision Making/ A&P                           Medical Decision Making Amount and/or Complexity of Data Reviewed Labs: ordered. Decision-making details documented in ED Course. Radiology: ordered and independent interpretation performed. Decision-making details documented in ED Course. ECG/medicine tests: ordered and independent interpretation performed. Decision-making details documented in ED Course.  Risk OTC drugs. Prescription drug management.   Patient with history of alcohol abuse status post detox 3 weeks ago here with fatigue and a 3-day history of jaundice and scleral icterus.  Concern for acute liver failure.  Bilirubin 27.9. INR 1.6. Cr  Na 120  MELD score is 30.   Discussed with Dr. Deno Etienne of gastroenterology.  She states as patient is not encephalopathic or coagulopathic no indication for emergent transfer for transplant consideration.  Recommends right upper quadrant ultrasound, chest x-ray, urinalysis, hydration overnight with thiamine, folate and CIWA protocol.  INR 1.6.  Ammonia is 47.  Patient not encephalopathic.  Will admit  to [facility] currently but discussed with patient she is at risk for fulminant hepatic failure potential requiring transplant.  {Document critical care time when appropriate:1} {Document review of labs and clinical decision tools ie heart score, Chads2Vasc2 etc:1}  {Document your independent review of radiology images, and any outside records:1} {Document your discussion with family members, caretakers, and with consultants:1} {Document social determinants of health affecting pt's care:1} {Document your decision making why or why not admission, treatments were needed:1} Final Clinical Impression(s) / ED Diagnoses Final diagnoses:  None    Rx / DC Orders ED Discharge Orders     None

## 2022-04-23 ENCOUNTER — Inpatient Hospital Stay (HOSPITAL_COMMUNITY): Payer: 59

## 2022-04-23 DIAGNOSIS — K852 Alcohol induced acute pancreatitis without necrosis or infection: Secondary | ICD-10-CM | POA: Diagnosis present

## 2022-04-23 DIAGNOSIS — R9431 Abnormal electrocardiogram [ECG] [EKG]: Secondary | ICD-10-CM | POA: Diagnosis present

## 2022-04-23 DIAGNOSIS — E871 Hypo-osmolality and hyponatremia: Secondary | ICD-10-CM | POA: Diagnosis present

## 2022-04-23 DIAGNOSIS — Z66 Do not resuscitate: Secondary | ICD-10-CM | POA: Diagnosis not present

## 2022-04-23 DIAGNOSIS — K7200 Acute and subacute hepatic failure without coma: Secondary | ICD-10-CM | POA: Diagnosis not present

## 2022-04-23 DIAGNOSIS — I781 Nevus, non-neoplastic: Secondary | ICD-10-CM | POA: Diagnosis present

## 2022-04-23 DIAGNOSIS — E8809 Other disorders of plasma-protein metabolism, not elsewhere classified: Secondary | ICD-10-CM | POA: Diagnosis present

## 2022-04-23 DIAGNOSIS — F321 Major depressive disorder, single episode, moderate: Secondary | ICD-10-CM | POA: Diagnosis present

## 2022-04-23 DIAGNOSIS — K709 Alcoholic liver disease, unspecified: Secondary | ICD-10-CM | POA: Diagnosis present

## 2022-04-23 DIAGNOSIS — K7010 Alcoholic hepatitis without ascites: Secondary | ICD-10-CM | POA: Diagnosis present

## 2022-04-23 DIAGNOSIS — Z79899 Other long term (current) drug therapy: Secondary | ICD-10-CM | POA: Diagnosis not present

## 2022-04-23 DIAGNOSIS — R531 Weakness: Secondary | ICD-10-CM | POA: Diagnosis present

## 2022-04-23 DIAGNOSIS — T380X5A Adverse effect of glucocorticoids and synthetic analogues, initial encounter: Secondary | ICD-10-CM | POA: Diagnosis present

## 2022-04-23 DIAGNOSIS — Z811 Family history of alcohol abuse and dependence: Secondary | ICD-10-CM | POA: Diagnosis not present

## 2022-04-23 DIAGNOSIS — D539 Nutritional anemia, unspecified: Secondary | ICD-10-CM | POA: Diagnosis present

## 2022-04-23 DIAGNOSIS — R0609 Other forms of dyspnea: Secondary | ICD-10-CM

## 2022-04-23 DIAGNOSIS — F1010 Alcohol abuse, uncomplicated: Secondary | ICD-10-CM | POA: Diagnosis present

## 2022-04-23 DIAGNOSIS — E876 Hypokalemia: Secondary | ICD-10-CM | POA: Diagnosis present

## 2022-04-23 DIAGNOSIS — Z6372 Alcoholism and drug addiction in family: Secondary | ICD-10-CM | POA: Diagnosis not present

## 2022-04-23 DIAGNOSIS — Z6837 Body mass index (BMI) 37.0-37.9, adult: Secondary | ICD-10-CM | POA: Diagnosis not present

## 2022-04-23 DIAGNOSIS — Z7189 Other specified counseling: Secondary | ICD-10-CM | POA: Diagnosis not present

## 2022-04-23 DIAGNOSIS — E1165 Type 2 diabetes mellitus with hyperglycemia: Secondary | ICD-10-CM | POA: Diagnosis present

## 2022-04-23 DIAGNOSIS — E669 Obesity, unspecified: Secondary | ICD-10-CM | POA: Diagnosis present

## 2022-04-23 DIAGNOSIS — K828 Other specified diseases of gallbladder: Secondary | ICD-10-CM | POA: Diagnosis present

## 2022-04-23 DIAGNOSIS — D638 Anemia in other chronic diseases classified elsewhere: Secondary | ICD-10-CM | POA: Diagnosis present

## 2022-04-23 DIAGNOSIS — J9811 Atelectasis: Secondary | ICD-10-CM | POA: Diagnosis present

## 2022-04-23 DIAGNOSIS — Y900 Blood alcohol level of less than 20 mg/100 ml: Secondary | ICD-10-CM | POA: Diagnosis present

## 2022-04-23 DIAGNOSIS — F419 Anxiety disorder, unspecified: Secondary | ICD-10-CM | POA: Diagnosis present

## 2022-04-23 DIAGNOSIS — R443 Hallucinations, unspecified: Secondary | ICD-10-CM | POA: Diagnosis not present

## 2022-04-23 DIAGNOSIS — K760 Fatty (change of) liver, not elsewhere classified: Secondary | ICD-10-CM | POA: Diagnosis present

## 2022-04-23 LAB — RETICULOCYTES
Immature Retic Fract: 6.8 % (ref 2.3–15.9)
RBC.: 2.56 MIL/uL — ABNORMAL LOW (ref 3.87–5.11)
Retic Count, Absolute: 86.3 10*3/uL (ref 19.0–186.0)
Retic Ct Pct: 3.4 % — ABNORMAL HIGH (ref 0.4–3.1)

## 2022-04-23 LAB — URINALYSIS, ROUTINE W REFLEX MICROSCOPIC
Glucose, UA: NEGATIVE mg/dL
Hgb urine dipstick: NEGATIVE
Ketones, ur: NEGATIVE mg/dL
Leukocytes,Ua: NEGATIVE
Nitrite: NEGATIVE
Protein, ur: NEGATIVE mg/dL
Specific Gravity, Urine: 1.014 (ref 1.005–1.030)
pH: 5 (ref 5.0–8.0)

## 2022-04-23 LAB — COMPREHENSIVE METABOLIC PANEL
ALT: 89 U/L — ABNORMAL HIGH (ref 0–44)
AST: 243 U/L — ABNORMAL HIGH (ref 15–41)
Albumin: 2.2 g/dL — ABNORMAL LOW (ref 3.5–5.0)
Alkaline Phosphatase: 85 U/L (ref 38–126)
Anion gap: 8 (ref 5–15)
BUN: 34 mg/dL — ABNORMAL HIGH (ref 6–20)
CO2: 20 mmol/L — ABNORMAL LOW (ref 22–32)
Calcium: 7.2 mg/dL — ABNORMAL LOW (ref 8.9–10.3)
Chloride: 94 mmol/L — ABNORMAL LOW (ref 98–111)
Creatinine, Ser: 0.65 mg/dL (ref 0.44–1.00)
GFR, Estimated: >60 mL/min (ref >60)
Glucose, Bld: 106 mg/dL — ABNORMAL HIGH (ref 70–99)
Potassium: 3.2 mmol/L — ABNORMAL LOW (ref 3.5–5.1)
Sodium: 122 mmol/L — ABNORMAL LOW (ref 135–145)
Total Bilirubin: 19.4 mg/dL (ref 0.3–1.2)
Total Protein: 5.1 g/dL — ABNORMAL LOW (ref 6.5–8.1)

## 2022-04-23 LAB — CBC
HCT: 22.1 % — ABNORMAL LOW (ref 36.0–46.0)
Hemoglobin: 8 g/dL — ABNORMAL LOW (ref 12.0–15.0)
MCH: 38.5 pg — ABNORMAL HIGH (ref 26.0–34.0)
MCHC: 36.2 g/dL — ABNORMAL HIGH (ref 30.0–36.0)
MCV: 106.3 fL — ABNORMAL HIGH (ref 80.0–100.0)
Platelets: 145 10*3/uL — ABNORMAL LOW (ref 150–400)
RBC: 2.08 MIL/uL — ABNORMAL LOW (ref 3.87–5.11)
RDW: 18.1 % — ABNORMAL HIGH (ref 11.5–15.5)
WBC: 8.3 10*3/uL (ref 4.0–10.5)
nRBC: 0 % (ref 0.0–0.2)

## 2022-04-23 LAB — ECHOCARDIOGRAM COMPLETE
AR max vel: 2.29 cm2
AV Area VTI: 2.28 cm2
AV Area mean vel: 2.06 cm2
AV Mean grad: 7 mmHg
AV Peak grad: 10.5 mmHg
Ao pk vel: 1.62 m/s
Area-P 1/2: 3.27 cm2
Height: 64 in
S' Lateral: 3 cm
Weight: 3520 oz

## 2022-04-23 LAB — BLOOD GAS, VENOUS
Acid-Base Excess: 0.8 mmol/L (ref 0.0–2.0)
Bicarbonate: 24.2 mmol/L (ref 20.0–28.0)
O2 Saturation: 66.8 %
Patient temperature: 37
pCO2, Ven: 34 mmHg — ABNORMAL LOW (ref 44–60)
pH, Ven: 7.46 — ABNORMAL HIGH (ref 7.25–7.43)
pO2, Ven: 39 mmHg (ref 32–45)

## 2022-04-23 LAB — FERRITIN: Ferritin: 1802 ng/mL — ABNORMAL HIGH (ref 11–307)

## 2022-04-23 LAB — DIFFERENTIAL
Abs Immature Granulocytes: 0.11 10*3/uL — ABNORMAL HIGH (ref 0.00–0.07)
Basophils Absolute: 0 10*3/uL (ref 0.0–0.1)
Basophils Relative: 0 %
Eosinophils Absolute: 0 10*3/uL (ref 0.0–0.5)
Eosinophils Relative: 0 %
Immature Granulocytes: 1 %
Lymphocytes Relative: 5 %
Lymphs Abs: 0.5 10*3/uL — ABNORMAL LOW (ref 0.7–4.0)
Monocytes Absolute: 0.8 10*3/uL (ref 0.1–1.0)
Monocytes Relative: 8 %
Neutro Abs: 9.6 10*3/uL — ABNORMAL HIGH (ref 1.7–7.7)
Neutrophils Relative %: 86 %

## 2022-04-23 LAB — RAPID URINE DRUG SCREEN, HOSP PERFORMED
Amphetamines: NOT DETECTED
Barbiturates: NOT DETECTED
Benzodiazepines: NOT DETECTED
Cocaine: NOT DETECTED
Opiates: NOT DETECTED
Tetrahydrocannabinol: NOT DETECTED

## 2022-04-23 LAB — HEPATITIS PANEL, ACUTE
HCV Ab: NONREACTIVE
Hep A IgM: NONREACTIVE
Hep B C IgM: NONREACTIVE
Hepatitis B Surface Ag: NONREACTIVE

## 2022-04-23 LAB — PREALBUMIN: Prealbumin: 5 mg/dL — ABNORMAL LOW (ref 18–38)

## 2022-04-23 LAB — TSH: TSH: 2.261 u[IU]/mL (ref 0.350–4.500)

## 2022-04-23 LAB — MAGNESIUM
Magnesium: 2.9 mg/dL — ABNORMAL HIGH (ref 1.7–2.4)
Magnesium: 2.2 mg/dL (ref 1.7–2.4)

## 2022-04-23 LAB — PROTIME-INR
INR: 1.7 — ABNORMAL HIGH (ref 0.8–1.2)
Prothrombin Time: 19.6 seconds — ABNORMAL HIGH (ref 11.4–15.2)

## 2022-04-23 LAB — HIV ANTIBODY (ROUTINE TESTING W REFLEX): HIV Screen 4th Generation wRfx: NONREACTIVE

## 2022-04-23 LAB — CK: Total CK: 29 U/L — ABNORMAL LOW (ref 38–234)

## 2022-04-23 LAB — IRON AND TIBC
Iron: 92 ug/dL (ref 28–170)
Saturation Ratios: 100 % — ABNORMAL HIGH (ref 10.4–31.8)
TIBC: 92 ug/dL — ABNORMAL LOW (ref 250–450)
UIBC: 0 ug/dL

## 2022-04-23 LAB — PHOSPHORUS
Phosphorus: 3.4 mg/dL (ref 2.5–4.6)
Phosphorus: 2.7 mg/dL (ref 2.5–4.6)

## 2022-04-23 LAB — LACTIC ACID, PLASMA: Lactic Acid, Venous: 1.6 mmol/L (ref 0.5–1.9)

## 2022-04-23 LAB — OSMOLALITY: Osmolality: 258 mOsm/kg — ABNORMAL LOW (ref 275–295)

## 2022-04-23 LAB — CREATININE, URINE, RANDOM: Creatinine, Urine: 140 mg/dL

## 2022-04-23 LAB — FOLATE: Folate: 5.8 ng/mL — ABNORMAL LOW (ref >5.9)

## 2022-04-23 LAB — VITAMIN B12: Vitamin B-12: 1337 pg/mL — ABNORMAL HIGH (ref 180–914)

## 2022-04-23 LAB — SODIUM, URINE, RANDOM: Sodium, Ur: <10 mmol/L

## 2022-04-23 LAB — ETHANOL: Alcohol, Ethyl (B): <10 mg/dL (ref <10)

## 2022-04-23 MED ORDER — POTASSIUM CHLORIDE 10 MEQ/100ML IV SOLN
10.0000 meq | INTRAVENOUS | Status: AC
  Administered 2022-04-23 (×4): 10 meq via INTRAVENOUS
  Filled 2022-04-23 (×4): qty 100

## 2022-04-23 MED ORDER — POTASSIUM CHLORIDE 10 MEQ/100ML IV SOLN
10.0000 meq | INTRAVENOUS | Status: AC
  Administered 2022-04-23 (×6): 10 meq via INTRAVENOUS
  Filled 2022-04-23 (×6): qty 100

## 2022-04-23 MED ORDER — LACTULOSE 10 GM/15ML PO SOLN
20.0000 g | Freq: Two times a day (BID) | ORAL | Status: DC
  Administered 2022-04-23 – 2022-04-26 (×6): 20 g via ORAL
  Filled 2022-04-23 (×6): qty 30

## 2022-04-23 MED ORDER — FENTANYL CITRATE PF 50 MCG/ML IJ SOSY
12.5000 ug | PREFILLED_SYRINGE | INTRAMUSCULAR | Status: DC | PRN
  Administered 2022-04-24: 50 ug via INTRAVENOUS
  Filled 2022-04-23: qty 1

## 2022-04-23 MED ORDER — SODIUM CHLORIDE 0.9 % IV SOLN: INTRAVENOUS | Status: AC

## 2022-04-23 MED ORDER — PREDNISOLONE 5 MG PO TABS
40.0000 mg | ORAL_TABLET | Freq: Every day | ORAL | Status: DC
  Administered 2022-04-23 – 2022-04-27 (×5): 40 mg via ORAL
  Filled 2022-04-23 (×6): qty 8

## 2022-04-23 MED ORDER — CALCIUM GLUCONATE-NACL 2-0.675 GM/100ML-% IV SOLN
2.0000 g | Freq: Once | INTRAVENOUS | Status: AC
  Administered 2022-04-23: 2000 mg via INTRAVENOUS
  Filled 2022-04-23: qty 100

## 2022-04-23 MED ORDER — MENTHOL 3 MG MT LOZG
1.0000 | LOZENGE | OROMUCOSAL | Status: DC | PRN
  Administered 2022-04-25: 3 mg via ORAL
  Filled 2022-04-23 (×2): qty 9

## 2022-04-23 NOTE — Assessment & Plan Note (Signed)
[facility] is aware holding off on prednisone for now until reevaluated.  Obtain ultrasound right upper quadrant Hepatitis serologies acetaminophen level unremarkable salicylate as well as unremarkable  MELD score unfortunately already up to 28 Overall poor prognosis but encourage patient to abstain from alcohol.

## 2022-04-23 NOTE — Assessment & Plan Note (Signed)
-   will monitor on tele avoid QT prolonging medications, rehydrate correct electrolytes ? ?

## 2022-04-23 NOTE — Consult Note (Signed)
Referring Provider: [facility] Primary Care Physician:  Pcp, No Primary Gastroenterologist: Althia Forts  Reason for Consultation:  jaundice, elevated t. bili  HPI: Deanna Mcmillan is a 51 y.o. female past medical history of alcohol abuse presented to the [facility] ED 10/1 for evaluation of jaundice and fatigue.   She reports starting 6 weeks ago she began increasing her alcohol use to 1.5 liters daily. Prior she had history of moderate alcohol use for many years, drinking 3-4 glasses of wine nightly. She has tried stopping all alcohol use before for several weeks without issue, so she wanted to again try to stop drinking after she realized she was drinking too much. She had her last drink 9/15. For the last week she has increasing jaundice and fatigue, her son first noticed the jaundice 1 week ago starting with her eyes. She reports increasing fatigue to the point she needed assistance from furniture to walk yesterday. Due to increasing fatigue and jaundice she presented to the ED.   No previous EGD or colonoscopy.  Denies melena, hematochezia, nausea, vomiting, abdominal pain. She has been constipated with straining recently. She had normal colored stool yesterday. Her urine has been dark for the past week.   Denies blood thinner use. Denies smoking. She has a family hx of alcoholism.    History reviewed. No pertinent past medical history.  Past Surgical History:  Procedure Laterality Date   KNEE ARTHROSCOPY W/ ACL RECONSTRUCTION Left 1994/5   Dr. Weston Anna   LAPAROSCOPIC APPENDECTOMY N/A 04/11/2013   Procedure: APPENDECTOMY LAPAROSCOPIC;  Surgeon: Stark Klein, MD;  Location: [place];  Service: General;  Laterality: N/A;   [place]   3 extractions    Prior to Admission medications   Medication Sig Start Date End Date Taking? Authorizing Provider  acetaminophen (TYLENOL) 500 MG tablet Take 500 mg by mouth every 6 (six) hours as needed for headache or moderate pain.   Yes  [provider]  oxyCODONE-acetaminophen (PERCOCET/ROXICET) 5-325 MG per tablet Take 1-2 tablets by mouth every 4 (four) hours as needed. Patient not taking: Reported on 04/22/2022 04/12/13   Jackolyn Confer, MD    Scheduled Meds:  folic acid  1 mg Oral Daily   LORazepam  0-4 mg Intravenous Q6H   Followed by   Derrill Memo ON 04/24/2022] LORazepam  0-4 mg Intravenous Q12H   multivitamin with minerals  1 tablet Oral Daily   thiamine  100 mg Oral Daily   Or   thiamine  100 mg Intravenous Daily   Continuous Infusions:  sodium chloride 50 mL/hr at 04/22/22 2309   sodium chloride     PRN Meds:.fentaNYL (SUBLIMAZE) injection, LORazepam **OR** LORazepam, menthol-cetylpyridinium  Allergies as of 04/22/2022   (No Known Allergies)    History reviewed. No pertinent family history.  Social History   Socioeconomic History   Marital status: Single    Spouse name: Not on file   Number of children: Not on file   Years of education: Not on file   Highest education level: Not on file  Occupational History   Not on file  Tobacco Use   Smoking status: Never   Smokeless tobacco: Not on file  Substance and Sexual Activity   Alcohol use: Yes    Comment: daily one glass of wine   Drug use: No   Sexual activity: Not on file  Other Topics Concern   Not on file  Social History Narrative   Not on file   Social Determinants of  Health   Financial Resource Strain: Not on file  Food Insecurity: Not on file  Transportation Needs: Not on file  Physical Activity: Not on file  Stress: Not on file  Social Connections: Not on file  Intimate Partner Violence: Not on file    Review of Systems: All negative except as stated above in HPI.  Physical Exam:Physical Exam Constitutional:      General: She is not in acute distress.    Appearance: Normal appearance. She is obese.  HENT:     Head: Normocephalic and atraumatic.     Right Ear: External ear normal.     Left Ear: External ear  normal.     Nose: Nose normal.     Mouth/Throat:     Mouth: Mucous membranes are moist.  Eyes:     General: Scleral icterus present.     Pupils: Pupils are equal, round, and reactive to light.  Cardiovascular:     Rate and Rhythm: Normal rate and regular rhythm.     Pulses: Normal pulses.     Heart sounds: Normal heart sounds.  Pulmonary:     Effort: Pulmonary effort is normal.     Breath sounds: Normal breath sounds.  Abdominal:     General: Abdomen is flat. Bowel sounds are normal. There is no distension.     Palpations: Abdomen is soft. There is no mass.     Tenderness: There is no abdominal tenderness. There is no guarding or rebound.     Hernia: No hernia is present.  Musculoskeletal:        General: Normal range of motion.     Cervical back: Normal range of motion and neck supple.     Right lower leg: Edema present.     Left lower leg: Edema present.  Skin:    General: Skin is warm and dry.     Coloration: Skin is jaundiced. Skin is not pale.  Neurological:     General: No focal deficit present.     Mental Status: She is alert and oriented to person, place, and time. Mental status is at baseline.  Psychiatric:        Mood and Affect: Mood normal.        Behavior: Behavior normal.     Vital signs: Vitals:   04/23/22 0645 04/23/22 0700  BP: (!) 105/59 100/60  Pulse: 90 88  Resp: 18 20  Temp:  98.5 F (36.9 C)  SpO2: 94% 96%        GI:  Lab Results: Recent Labs    04/22/22 2140 04/23/22 0453  WBC 11.3* 8.3  HGB 10.1* 8.0*  HCT 27.0* 22.1*  PLT 174 145*   BMET Recent Labs    04/22/22 2140 04/23/22 0559  NA 120* 122*  K 3.3* 3.2*  CL 82* 94*  CO2 25 20*  GLUCOSE 125* 106*  BUN 38* 34*  CREATININE 0.93 0.65  CALCIUM 9.2 7.2*   LFT Recent Labs    04/23/22 0559  PROT 5.1*  ALBUMIN 2.2*  AST 243*  ALT 89*  ALKPHOS 85  BILITOT 19.4*   PT/INR Recent Labs    04/22/22 2155 04/23/22 0426  LABPROT 19.1* 19.6*  INR 1.6* 1.7*      Studies/Results: US Abdomen Limited RUQ (LIVER/GB)  Result Date: 04/22/2022 CLINICAL DATA:  Liver disorder EXAM: ULTRASOUND ABDOMEN LIMITED RIGHT UPPER QUADRANT COMPARISON:  No prior ultrasound, correlation is made with CT abdomen pelvis 04/11/2013 FINDINGS: Gallbladder: Gallbladder sludge. No gallstones or wall  thickening visualized. No sonographic Murphy sign noted by sonographer. Common bile duct: Diameter: 5 mm, within normal limits. No intrahepatic biliary ductal dilatation. Liver: No focal lesion identified. Increased parenchymal echogenicity with coarsened echotexture. Portal vein is patent on color Doppler imaging with normal direction of blood flow towards the liver. Other: None. IMPRESSION: 1. Hepatic steatosis. 2. Gallbladder sludge. Electronically Signed   By: Merilyn Baba M.D.   On: 04/22/2022 23:52   DG Chest Portable 1 View  Result Date: 04/22/2022 CLINICAL DATA:  Generalized weakness EXAM: PORTABLE CHEST 1 VIEW COMPARISON:  None Available. FINDINGS: Cardiac shadow is within normal limits. Mild bibasilar atelectasis is seen. No other focal infiltrate is noted. No bony abnormality is seen. IMPRESSION: Mild bibasilar atelectasis. Electronically Signed   By: Inez Catalina M.D.   On: 04/22/2022 22:14    Impression: Alcoholic hepatitis Alcohol abuse Elevated LFTs Hyperbilirubinemia  HGB 8.0 Platelets 145 AST 243 ALT 89  Alkphos 85 TBili 19.4 GFR >60  INR 04/23/2022 1.7  DF: 52.1  UA with moderate bilirubin, no UTI  Chest X-ray: mild bibasilar atelectasis RUS [place]: hepatic steatosis, gallbladder sludge  Patient with significant history of heavy alcohol use 6 weeks ago. Her last drink was 9/15, she has increasing jaundice and fatigue since quitting alcohol. DF of 52.1 and her history are suggestive of alcoholic hepatitis. Other sources of infection have been evaluated with UA and Chest xray and are negative.    Plan: Starting Prednisolone 40 mg once daily for a total of 28  days.  Will continue to monitor T. Bili for downward trend.  Continue low sodium diet Continue thiamine, multivitamin and folic acid. Counseled on alcohol cessation.  [facility] will follow.   LOS: 0 days   Charlott Rakes  PA-C 04/23/2022, 7:52 AM  Contact #  [phone]

## 2022-04-23 NOTE — Progress Notes (Signed)
Deanna Mcmillan FKC:[id number] DOB: Nov 28, 1970 DOA: 04/22/2022 PCP: Merryl Hacker, No   Subj: Deanna Mcmillan is a 51 y.o. WF PMHx ETOH abuse     Presented with  jaundice and leg edema  Weakness, fatigue, body aches for a week  Undergone detox from etoh 3 wks ago Now notes jaundice and leg swelling Reports SOB At baseline drinks 1.5 L of wine per day Last etoh was 3 wks ago Did so by herself at home  No seizures   Pt does not smoke  Very dedicated to stop drinking as well Reports her tremors have resolved     Liver disease MELD 3.0: 28 at 04/22/2022  9:55 PM   Chronic anemia - baseline hg Hemoglobin & Hematocrit    Obj: 10/2A/O x4, negative abdominal pain, negative nausea, negative vomiting, negative CP.   Objective: VITAL SIGNS: Temp: 98.5 F (36.9 C) (10/02 0700) Temp Source: Oral (10/02 0315) BP: 100/60 (10/02 0700) Pulse Rate: 88 (10/02 0700) SPO2; FIO2:   Intake/Output Summary (Last 24 hours) at 04/23/2022 0837 Last data filed at 04/23/2022 0250 Gross per 24 hour  Intake 250 ml  Output --  Net 250 ml    Exam: General: A/O x4, No acute respiratory distress Eyes: negative scleral hemorrhage, negative anisocoria, Positive icterus Lungs: Clear to auscultation bilaterally without wheezes or crackles Cardiovascular: Regular rate and rhythm without murmur gallop or rub normal S1 and S2 Abdomen: MORBID OBESITY, negative nontender, nondistended, soft, bowel sounds positive, no rebound, no ascites, no appreciable mass Extremities: No significant cyanosis, clubbing, or edema bilateral lower extremities Skin: Positive jaundice, negative rashes, lesions, ulcers Psychiatric:  Negative depression, negative anxiety, negative fatigue, negative mania  Central nervous system:  Cranial nerves II through XII intact, tongue/uvula midline, all extremities muscle strength 5/5, sensation intact throughout, negative dysarthria, negative expressive aphasia, negative receptive aphasia.  .    Mobility Assessment (last 72 hours)     Mobility Assessment   No documentation.             DVT prophylaxis:  Code Status: Full Family Communication: 10/2 son at bedside for discussion plan of care all questions answered Status is: Inpatient    Dispo: The patient is from: Home              Anticipated d/c is to: Home              Anticipated d/c date is: > 3 days              Patient currently is not medically stable to d/c.    Procedures/Significant Events:    Consultants:  [facility] Dr Ronnette Juniper   Cultures 10/2 Hepatitis panel pending  Antimicrobials:   A/P  Alcoholic liver disease, unspecified ([place]) -MELD score= 28 -Per [facility] Prednisolone 40 mg once daily for a total of 28 days -[facility] will follow along - Monitor for downward trend in T. bili - Continue low-sodium diet -Hepatitis panel pending - Tylenol/salicylate negative - 74/9 urine drug screen negative -Overall poor prognosis   Alcohol abuse -Patient currently states in remission. -CIWA protocol  - 10/2 EtOH level<10   QTc Prolongation --Will monitor on tele avoid QT prolonging medications, rehydrate correct electrolytes  Obesity (BMI 37.76 kg/m.) -  Hypoalbuminemia -In the setting of likely hepatic cirrhosis.   -We will administer albumin to help with fluid status.   Hyponatremia -In the setting of alcoholic liver disease most likely cirrhosis.     Hypokalemia -Potassium goal> 4 -  10/2 Potassium IV 60 mEq  Hypocalcemia - Calcium goal> 8.9 - 10/2 Calcium IV 2 g  Goals of care - 10/2 Palliative Care consult patient EtOH abuse/EtOH liver disease MELD score 28 evaluate patient change status to DNR, discussed short-term vs long-term goals of care, appointment HCPOA, Will          Care during the described time interval was provided by me .  I have reviewed this patient's available data, including medical history, events of note, physical examination, and all test  results as part of my evaluation.

## 2022-04-23 NOTE — Assessment & Plan Note (Signed)
Patient currently states in remission. ER ordered CIWA protocol for now we will continue to monitor for any signs of withdrawal

## 2022-04-23 NOTE — Assessment & Plan Note (Signed)
In the setting of alcoholic liver disease most likely cirrhosis.  Obtain electrolytes.  Rehydrate and follow

## 2022-04-23 NOTE — Assessment & Plan Note (Signed)
-   will replace and repeat in AM,  check magnesium level and replace as needed ° °

## 2022-04-23 NOTE — Assessment & Plan Note (Signed)
In the setting of likely hepatic cirrhosis.  We will administer albumin to help with fluid status.

## 2022-04-23 NOTE — Evaluation (Signed)
Physical Therapy Evaluation Patient Details Name: Deanna Mcmillan MRN: [id number] DOB: 21-Aug-1970 Today's Date: 04/23/2022  History of Present Illness  51 yo female admitted with ETOH liver disease, jaundice, weakness. Hx of ETOH abuse  Clinical Impression  On eval, pt required Min guard-Min A for mobility. She reports general weakness and difficulty ambulating. She just arrived to [place] from ED. Assisted pt to the bathroom at her request. She declined further ambulation and requested to return to bed. Will plan to follow and progress activity as tolerated. May need HHPT f/u and RW for ambulation safety, depending on progress.        Recommendations for follow up therapy are one component of a multi-disciplinary discharge planning process, led by the attending physician.  Recommendations may be updated based on patient status, additional functional criteria and insurance authorization.  Follow Up Recommendations Home health PT (vs no follow up, depending on continued progress)      Assistance Recommended at Discharge Intermittent Supervision/Assistance  Patient can return home with the following  A little help with walking and/or transfers;A little help with bathing/dressing/bathroom;Help with stairs or ramp for entrance    Equipment Recommendations Rolling walker (2 wheels) (TBD-continuing to assess)  Recommendations for Other Services  OT consult    Functional Status Assessment Patient has had a recent decline in their functional status and demonstrates the ability to make significant improvements in function in a reasonable and predictable amount of time.     Precautions / Restrictions Precautions Precautions: Fall Restrictions Weight Bearing Restrictions: No      Mobility  Bed Mobility Overal bed mobility: Needs Assistance Bed Mobility: Supine to Sit, Sit to Supine     Supine to sit: Modified independent (Device/Increase time), HOB elevated Sit to supine: Min assist,  HOB elevated   General bed mobility comments: Assist for LEs onto bed. Increased time.    Transfers Overall transfer level: Needs assistance Equipment used: Rolling walker (2 wheels) Transfers: Sit to/from Stand Sit to Stand: Min guard           General transfer comment: Min guard for safety. Cues for safety, hand placement.    Ambulation/Gait Ambulation/Gait assistance: Min guard Gait Distance (Feet): 15 Feet (x2) Assistive device: Rolling walker (2 wheels) Gait Pattern/deviations: Step-through pattern, Decreased stride length       General Gait Details: Min guard for safety. Slow gait speed. Pt denied dizziness. She declined hallway ambulation on today.  Stairs            Wheelchair Mobility    Modified Rankin (Stroke Patients Only)       Balance Overall balance assessment: Needs assistance         Standing balance support: Reliant on assistive device for balance, During functional activity, Bilateral upper extremity supported Standing balance-Leahy Scale: Fair                               Pertinent Vitals/Pain Pain Assessment Pain Assessment: No/denies pain    Home Living Family/patient expects to be discharged to:: Private residence Living Arrangements: Children Available Help at Discharge: Family Type of Home: House Home Access: Stairs to enter Entrance Stairs-Rails: None Entrance Stairs-Number of Steps: 4 Alternate Level Stairs-Number of Steps: 1 flight Home Layout: Able to live on main level with bedroom/bathroom;Two level Home Equipment: None      Prior Function Prior Level of Function : Independent/Modified Independent  Hand Dominance        Extremity/Trunk Assessment   Upper Extremity Assessment Upper Extremity Assessment: Defer to OT evaluation    Lower Extremity Assessment Lower Extremity Assessment: Generalized weakness    Cervical / Trunk Assessment Cervical / Trunk  Assessment: Normal  Communication   Communication: No difficulties  Cognition Arousal/Alertness: Awake/alert Behavior During Therapy: WFL for tasks assessed/performed Overall Cognitive Status: Within Functional Limits for tasks assessed                                          General Comments      Exercises     Assessment/Plan    PT Assessment Patient needs continued PT services  PT Problem List Decreased strength;Decreased mobility;Decreased activity tolerance;Decreased balance;Decreased knowledge of use of DME       PT Treatment Interventions DME instruction;Gait training;Therapeutic activities;Therapeutic exercise;Patient/family education;Balance training;Functional mobility training    PT Goals (Current goals can be found in the Care Plan section)  Acute Rehab PT Goals Patient Stated Goal: to get stronger PT Goal Formulation: With patient Time For Goal Achievement: 05/07/22 Potential to Achieve Goals: Good    Frequency Min 3X/week     Co-evaluation               AM-PAC PT "6 Clicks" Mobility  Outcome Measure Help needed turning from your back to your side while in a flat bed without using bedrails?: None Help needed moving from lying on your back to sitting on the side of a flat bed without using bedrails?: A Little Help needed moving to and from a bed to a chair (including a wheelchair)?: A Little Help needed standing up from a chair using your arms (e.g., wheelchair or bedside chair)?: A Little Help needed to walk in hospital room?: A Little Help needed climbing 3-5 steps with a railing? : A Lot 6 Click Score: 18    End of Session Equipment Utilized During Treatment: Gait belt Activity Tolerance: Patient tolerated treatment well Patient left: in bed;with call bell/phone within reach;with family/visitor present;with bed alarm set   PT Visit Diagnosis: Muscle weakness (generalized) (M62.81);Difficulty in walking, not elsewhere classified  (R26.2)    Time: 1610-9604 PT Time Calculation (min) (ACUTE ONLY): 12 min   Charges:   PT Evaluation $PT Eval Low Complexity: [employer], PT Acute Rehabilitation  Office: [phone] Pager: [phone]

## 2022-04-24 ENCOUNTER — Inpatient Hospital Stay (HOSPITAL_COMMUNITY): Payer: 59

## 2022-04-24 DIAGNOSIS — Z7189 Other specified counseling: Secondary | ICD-10-CM | POA: Diagnosis not present

## 2022-04-24 DIAGNOSIS — E8809 Other disorders of plasma-protein metabolism, not elsewhere classified: Secondary | ICD-10-CM | POA: Diagnosis not present

## 2022-04-24 DIAGNOSIS — K852 Alcohol induced acute pancreatitis without necrosis or infection: Secondary | ICD-10-CM

## 2022-04-24 DIAGNOSIS — K7200 Acute and subacute hepatic failure without coma: Secondary | ICD-10-CM | POA: Diagnosis not present

## 2022-04-24 DIAGNOSIS — F1010 Alcohol abuse, uncomplicated: Secondary | ICD-10-CM | POA: Diagnosis not present

## 2022-04-24 DIAGNOSIS — K709 Alcoholic liver disease, unspecified: Secondary | ICD-10-CM | POA: Diagnosis not present

## 2022-04-24 DIAGNOSIS — R17 Unspecified jaundice: Secondary | ICD-10-CM | POA: Diagnosis present

## 2022-04-24 LAB — COMPREHENSIVE METABOLIC PANEL
ALT: 114 U/L — ABNORMAL HIGH (ref 0–44)
ALT: 126 U/L — ABNORMAL HIGH (ref 0–44)
AST: 260 U/L — ABNORMAL HIGH (ref 15–41)
AST: 303 U/L — ABNORMAL HIGH (ref 15–41)
Albumin: 2.7 g/dL — ABNORMAL LOW (ref 3.5–5.0)
Albumin: 2.9 g/dL — ABNORMAL LOW (ref 3.5–5.0)
Alkaline Phosphatase: 115 U/L (ref 38–126)
Alkaline Phosphatase: 121 U/L (ref 38–126)
Anion gap: 12 (ref 5–15)
Anion gap: 11 (ref 5–15)
BUN: 36 mg/dL — ABNORMAL HIGH (ref 6–20)
BUN: 37 mg/dL — ABNORMAL HIGH (ref 6–20)
CO2: 21 mmol/L — ABNORMAL LOW (ref 22–32)
CO2: 21 mmol/L — ABNORMAL LOW (ref 22–32)
Calcium: 9.2 mg/dL (ref 8.9–10.3)
Calcium: 9.1 mg/dL (ref 8.9–10.3)
Chloride: 87 mmol/L — ABNORMAL LOW (ref 98–111)
Chloride: 87 mmol/L — ABNORMAL LOW (ref 98–111)
Creatinine, Ser: 0.71 mg/dL (ref 0.44–1.00)
Creatinine, Ser: 0.61 mg/dL (ref 0.44–1.00)
GFR, Estimated: >60 mL/min (ref >60)
GFR, Estimated: >60 mL/min (ref >60)
Glucose, Bld: 123 mg/dL — ABNORMAL HIGH (ref 70–99)
Glucose, Bld: 121 mg/dL — ABNORMAL HIGH (ref 70–99)
Potassium: 4 mmol/L (ref 3.5–5.1)
Potassium: 3.6 mmol/L (ref 3.5–5.1)
Sodium: 120 mmol/L — ABNORMAL LOW (ref 135–145)
Sodium: 119 mmol/L — CL (ref 135–145)
Total Bilirubin: 23.5 mg/dL (ref 0.3–1.2)
Total Bilirubin: 25.1 mg/dL (ref 0.3–1.2)
Total Protein: 6.5 g/dL (ref 6.5–8.1)
Total Protein: 6.9 g/dL (ref 6.5–8.1)

## 2022-04-24 LAB — CBC WITH DIFFERENTIAL/PLATELET
Abs Immature Granulocytes: 0.11 10*3/uL — ABNORMAL HIGH (ref 0.00–0.07)
Basophils Absolute: 0 10*3/uL (ref 0.0–0.1)
Basophils Relative: 0 %
Eosinophils Absolute: 0 10*3/uL (ref 0.0–0.5)
Eosinophils Relative: 0 %
HCT: 26.5 % — ABNORMAL LOW (ref 36.0–46.0)
Hemoglobin: 9.6 g/dL — ABNORMAL LOW (ref 12.0–15.0)
Immature Granulocytes: 1 %
Lymphocytes Relative: 5 %
Lymphs Abs: 0.5 10*3/uL — ABNORMAL LOW (ref 0.7–4.0)
MCH: 38.6 pg — ABNORMAL HIGH (ref 26.0–34.0)
MCHC: 36.2 g/dL — ABNORMAL HIGH (ref 30.0–36.0)
MCV: 106.4 fL — ABNORMAL HIGH (ref 80.0–100.0)
Monocytes Absolute: 0.6 10*3/uL (ref 0.1–1.0)
Monocytes Relative: 6 %
Neutro Abs: 9.6 10*3/uL — ABNORMAL HIGH (ref 1.7–7.7)
Neutrophils Relative %: 88 %
Platelets: 178 10*3/uL (ref 150–400)
RBC: 2.49 MIL/uL — ABNORMAL LOW (ref 3.87–5.11)
RDW: 18.4 % — ABNORMAL HIGH (ref 11.5–15.5)
WBC: 10.9 10*3/uL — ABNORMAL HIGH (ref 4.0–10.5)
nRBC: 0 % (ref 0.0–0.2)

## 2022-04-24 LAB — AMMONIA: Ammonia: 33 umol/L (ref 9–35)

## 2022-04-24 LAB — GLUCOSE, CAPILLARY
Glucose-Capillary: 115 mg/dL — ABNORMAL HIGH (ref 70–99)
Glucose-Capillary: 122 mg/dL — ABNORMAL HIGH (ref 70–99)
Glucose-Capillary: 133 mg/dL — ABNORMAL HIGH (ref 70–99)

## 2022-04-24 LAB — MRSA NEXT GEN BY PCR, NASAL: MRSA by PCR Next Gen: NOT DETECTED

## 2022-04-24 LAB — MAGNESIUM: Magnesium: 2.6 mg/dL — ABNORMAL HIGH (ref 1.7–2.4)

## 2022-04-24 LAB — LIPASE, BLOOD: Lipase: 144 U/L — ABNORMAL HIGH (ref 11–51)

## 2022-04-24 LAB — OSMOLALITY, URINE: Osmolality, Ur: 349 mOsm/kg (ref 300–900)

## 2022-04-24 LAB — PHOSPHORUS: Phosphorus: 3 mg/dL (ref 2.5–4.6)

## 2022-04-24 LAB — AMYLASE: Amylase: 91 U/L (ref 28–100)

## 2022-04-24 MED ORDER — SIMETHICONE 80 MG PO CHEW
80.0000 mg | CHEWABLE_TABLET | Freq: Once | ORAL | Status: AC
  Administered 2022-04-24: 80 mg via ORAL
  Filled 2022-04-24: qty 1

## 2022-04-24 MED ORDER — CHLORHEXIDINE GLUCONATE CLOTH 2 % EX PADS
6.0000 | MEDICATED_PAD | Freq: Every day | CUTANEOUS | Status: DC
  Administered 2022-04-24 – 2022-04-25 (×2): 6 via TOPICAL

## 2022-04-24 MED ORDER — PAROXETINE HCL 10 MG PO TABS
10.0000 mg | ORAL_TABLET | Freq: Every day | ORAL | Status: DC
  Administered 2022-04-24: 10 mg via ORAL
  Filled 2022-04-24: qty 1

## 2022-04-24 MED ORDER — SIMETHICONE 80 MG PO CHEW
80.0000 mg | CHEWABLE_TABLET | Freq: Four times a day (QID) | ORAL | Status: DC | PRN
  Filled 2022-04-24: qty 1

## 2022-04-24 MED ORDER — HYDROMORPHONE HCL 1 MG/ML IJ SOLN
1.0000 mg | INTRAMUSCULAR | Status: DC | PRN
  Administered 2022-04-26 – 2022-04-27 (×3): 1 mg via INTRAVENOUS
  Filled 2022-04-24 (×3): qty 1

## 2022-04-24 MED ORDER — PANTOPRAZOLE SODIUM 40 MG IV SOLR
40.0000 mg | Freq: Two times a day (BID) | INTRAVENOUS | Status: DC
  Administered 2022-04-24 – 2022-04-25 (×2): 40 mg via INTRAVENOUS
  Filled 2022-04-24 (×2): qty 10

## 2022-04-24 MED ORDER — INSULIN ASPART 100 UNIT/ML IJ SOLN
0.0000 [IU] | INTRAMUSCULAR | Status: DC
  Administered 2022-04-24 – 2022-04-27 (×9): 2 [IU] via SUBCUTANEOUS

## 2022-04-24 NOTE — Plan of Care (Signed)

## 2022-04-24 NOTE — Discharge Instructions (Signed)

## 2022-04-24 NOTE — Progress Notes (Signed)
Chaplain engaged in an initial visit with [facility].  Deanna Mcmillan had already received education on completing the Advanced Directive.  She had not filled it out yet.  Chaplain let her know it was no rush and how to reach her when she is ready to have it completed.      04/24/22 1400  Clinical Encounter Type  Visited With Patient and family together  Visit Type Initial;Social support

## 2022-04-24 NOTE — Progress Notes (Signed)
SATURATION QUALIFICATIONS: (This note is used to comply with regulatory documentation for home oxygen)  Patient Saturations on Room Air at Rest = 97%  Patient Saturations on Room Air while Ambulating = 98%    Please briefly explain why patient needs home oxygen: Patient does not desat with ambulation. Patient kept SPO2 above 97% during O2 walk.

## 2022-04-24 NOTE — Evaluation (Signed)
Occupational Therapy Evaluation Patient Details Name: Deanna Mcmillan MRN: [id number] DOB: 1971/01/11 Today's Date: 04/24/2022   History of Present Illness 51 yo female admitted with ETOH liver disease, jaundice, weakness. Hx of ETOH abuse   Clinical Impression   Pt admitted with the above. Pt currently with functional limitations due to the deficits listed below (see OT Problem List).  Pt will benefit from skilled OT to increase their safety and independence with ADL and functional mobility for ADL to facilitate discharge to venue listed below.        Recommendations for follow up therapy are one component of a multi-disciplinary discharge planning process, led by the attending physician.  Recommendations may be updated based on patient status, additional functional criteria and insurance authorization.   Follow Up Recommendations  No OT follow up    Assistance Recommended at Discharge Set up Supervision/Assistance  Patient can return home with the following A little help with walking and/or transfers    Functional Status Assessment  Patient has had a recent decline in their functional status and demonstrates the ability to make significant improvements in function in a reasonable and predictable amount of time.  Equipment Recommendations  BSC/3in1       Precautions / Restrictions Precautions Precautions: Fall      Mobility Bed Mobility   Bed Mobility: Supine to Sit, Sit to Supine     Supine to sit: Min assist          Transfers Overall transfer level: Needs assistance Equipment used: Rolling walker (2 wheels)   Sit to Stand: Min assist                  Balance Overall balance assessment: Needs assistance         Standing balance support: Reliant on assistive device for balance, During functional activity, Bilateral upper extremity supported Standing balance-Leahy Scale: Fair                             ADL either performed or assessed  with clinical judgement   ADL Overall ADL's : Needs assistance/impaired Eating/Feeding: Independent   Grooming: Standing;Supervision/safety   Upper Body Bathing: Set up;Sitting   Lower Body Bathing: Moderate assistance;Sit to/from stand   Upper Body Dressing : Set up;Sitting   Lower Body Dressing: Moderate assistance;Sit to/from stand   Toilet Transfer: Minimal assistance;Ambulation;Stand-pivot;Cueing for sequencing;Cueing for safety   Toileting- Clothing Manipulation and Hygiene: Minimal assistance;Sit to/from stand       Functional mobility during ADLs: Minimal assistance       Vision Patient Visual Report: No change from baseline              Pertinent Vitals/Pain Pain Assessment Pain Assessment: 0-10 Pain Score: 4  Pain Location: stomach Pain Descriptors / Indicators: Aching Pain Intervention(s): Limited activity within patient's tolerance, Repositioned     Hand Dominance     Extremity/Trunk Assessment Upper Extremity Assessment Upper Extremity Assessment: Generalized weakness           Communication Communication Communication: No difficulties   Cognition Arousal/Alertness: Awake/alert Behavior During Therapy: WFL for tasks assessed/performed Overall Cognitive Status: Within Functional Limits for tasks assessed                                                  Home  Living Family/patient expects to be discharged to:: Private residence Living Arrangements: Children Available Help at Discharge: Family Type of Home: House Home Access: Stairs to enter Technical brewer of Steps: 4 Entrance Stairs-Rails: None Home Layout: Able to live on main level with bedroom/bathroom;Two level Alternate Level Stairs-Number of Steps: 1 flight             Home Equipment: None          Prior Functioning/Environment Prior Level of Function : Independent/Modified Independent                        OT Problem List:  Decreased strength;Obesity;Impaired balance (sitting and/or standing)      OT Treatment/Interventions: Self-care/ADL training;Patient/family education    OT Goals(Current goals can be found in the care plan section) Acute Rehab OT Goals Patient Stated Goal: get well OT Goal Formulation: With patient Time For Goal Achievement: 05/08/22  OT Frequency: Min 2X/week       AM-PAC OT "6 Clicks" Daily Activity     Outcome Measure Help from another person eating meals?: None Help from another person taking care of personal grooming?: None Help from another person toileting, which includes using toliet, bedpan, or urinal?: A Little Help from another person bathing (including washing, rinsing, drying)?: A Little Help from another person to put on and taking off regular upper body clothing?: None Help from another person to put on and taking off regular lower body clothing?: A Little 6 Click Score: 21   End of Session Nurse Communication: Mobility status  Activity Tolerance: Patient tolerated treatment well Patient left: in chair;with call bell/phone within reach;with family/visitor present  OT Visit Diagnosis: Unsteadiness on feet (R26.81);Muscle weakness (generalized) (M62.81)                   Charges:  OT General Charges $OT Visit: 1 Visit OT Evaluation $OT Eval Low Complexity: [place], [place]  [phone]     Payton Mccallum D 04/24/2022, 1:04 PM

## 2022-04-24 NOTE — Progress Notes (Signed)
Date and time results received: 04/24/22 1956 (use smartphrase ".now" to insert current time)  Test: Sodium  Critical Value: 119  Test: Total Bilirubin Critical Value: 25.1  Name of Provider Notified: Olena Heckle NP 04/24/22 1959  Orders Received? Or Actions Taken?: Provider aware, No new orders received.  Will continue to monitor.

## 2022-04-24 NOTE — Progress Notes (Signed)
Initial Nutrition Assessment  DOCUMENTATION CODES:  Obesity unspecified  INTERVENTION:  -Continue 2g Na diet as tolerated -Reinforce diet education as needed  NUTRITION DIAGNOSIS:  Food and nutrition related knowledge deficit related to other (see comment) (new diagnosis of alcoholic liver disease) as evidenced by other (comment) (lack of prior education).  GOAL:  Patient will meet greater than or equal to 90% of their needs, Other (Comment) (teach back diet education)  MONITOR:  PO intake  REASON FOR ASSESSMENT:  Consult Assessment of nutrition requirement/status  ASSESSMENT:  Pt is a 51yo F with PMH of ETOH abuse who presents with jaundice and leg edema.  Visited pt at bedside this afternoon with mother present. She reports decreased appetite just today due to lactulose. No recent significant weight loss, no changes in po intake. No physical signs of malnutrition noted. But pt remains jaundice. At home, pt has been following a mostly vegetarian diet. Diet recall likely not meeting protein needs. She reports taste for chicken is slowly returning as she feels better. Encouraged continuation of 2g Na diet with adequate protein at home. Reviewed sources of protein both vegetarian and non-vegetarian. Discussed usual diet and ways to include protein. Pt and mother asked appropriate questions and demonstrate understanding.    Medications reviewed and include: folic acid, lactulose, MVI, prednisolone, thiamine  Labs reviewed: Na:120, K:4.0, BG:123, BUN:36, Cr:0.71, Phos:3.0, Mg:2.6, Alk Phos:115, AST:260, ALT:114, total bili 23.5, GFR>60  NUTRITION - FOCUSED PHYSICAL EXAM:  Flowsheet Row Most Recent Value  Orbital Region No depletion  Upper Arm Region No depletion  Thoracic and Lumbar Region No depletion  Buccal Region No depletion  Temple Region No depletion  Clavicle Bone Region No depletion  Clavicle and Acromion Bone Region No depletion  Scapular Bone Region No depletion   Dorsal Hand No depletion  Patellar Region No depletion  Anterior Thigh Region No depletion  Posterior Calf Region No depletion  Edema (RD Assessment) Moderate  Hair Reviewed  Eyes Reviewed  Mouth Reviewed  Skin Reviewed  Nails Reviewed       Diet Order:   Diet Order             Diet 2 gram sodium Room service appropriate? Yes; Fluid consistency: Thin  Diet effective now                   EDUCATION NEEDS:   Education needs have been addressed  Skin:  Skin Assessment: Reviewed RN Assessment  Last BM:  04/23/22  Height:  Ht Readings from Last 1 Encounters:  04/22/22 _0  (1.626 m)    Weight:  Wt Readings from Last 1 Encounters:  04/22/22 99.8 kg    BMI:  Body mass index is 37.76 kg/m.  Estimated Nutritional Needs:   Kcal:  1800-2200kcal  Protein:  80-110g  Fluid:  >1847m  KCandise Bowens MS, RD, LDN, CNSC See AMiON for contact information

## 2022-04-24 NOTE — TOC Initial Note (Signed)
Transition of Care [facility]) - Initial/Assessment Note    Patient Details  Name: Deanna Mcmillan MRN: [id number] Date of Birth: [birth year]  Transition of [facility]) CM/SW Contact:    Vassie Moselle, LCSW Phone Number: 04/24/2022, 1:51 PM  Clinical Narrative:                 Met with pt and offered resources for substance use and mental health. Pt is motivated to seek treatment. Resources have been added to pt's discharge instructions. CSW also discussed current recommendations for home health with the potential she will not need PT follow up depending on how she progresses while in the hospital. Pt is understanding of this and is agreeable to having home health services set up if this remains the recommendation. CSW reviewed DME recommendations. Pt does not want BSC but, would like to have RW. TOC will continue to follow for further recommendations and discharge planning.   Expected Discharge Plan: [place] Barriers to Discharge: Continued Medical Work up   Patient Goals and CMS Choice Patient states their goals for this hospitalization and ongoing recovery are:: To return home CMS Medicare.gov Compare Post Acute Care list provided to:: Patient Choice offered to / list presented to : Patient  Expected Discharge Plan and Services Expected Discharge Plan: [place] In-house Referral: Hospice / Palliative Care Discharge Planning Services: CM Consult Post Acute Care Choice: Durable Medical Equipment, Home Health Living arrangements for the past 2 months: Single Family Home                                      Prior Living Arrangements/Services Living arrangements for the past 2 months: Single Family Home Lives with:: Adult Children Patient language and need for interpreter reviewed:: Yes Do you feel safe going back to the place where you live?: Yes      Need for Family Participation in Patient Care: No (Comment) Care giver support  system in place?: No (comment)   Criminal Activity/Legal Involvement Pertinent to Current Situation/Hospitalization: No - Comment as needed  Activities of Daily Living      Permission Sought/Granted Permission sought to share information with : Case Manager Permission granted to share information with : No              Emotional Assessment Appearance:: Appears stated age Attitude/Demeanor/Rapport: Engaged Affect (typically observed): Accepting Orientation: : Oriented to Self, Oriented to Place, Oriented to  Time, Oriented to Situation Alcohol / Substance Use: Alcohol Use Psych Involvement: No (comment)  Admission diagnosis:  Acute liver failure without hepatic coma [A89.02] Alcoholic liver disease, unspecified ([place]) [K70.9] Patient Active Problem List   Diagnosis Date Noted   Alcoholic liver disease, unspecified ([place]) 04/23/2022   Hypoalbuminemia 04/23/2022   Hyponatremia 04/23/2022   Alcohol abuse 04/23/2022   QT prolongation 04/23/2022   Hypokalemia 04/23/2022   Appendicitis, acute s/p laparoscopic appendectomy 04/11/13 04/12/2013   PCP:  Pcp, No Pharmacy:   [id number] [place] [id number]: [phone]: [phone]    Social Determinants of Health (SDOH) Interventions    Readmission Risk Interventions    04/24/2022    1:49 PM  Readmission Risk Prevention Plan  Post Dischage Appt Complete  Medication Screening Complete  Transportation Screening Complete

## 2022-04-24 NOTE — Progress Notes (Signed)
    OVERNIGHT PROGRESS REPORT  Notified by attending of need to transfer patient to SDU/ICU for closer monitoring including sodium levels in reference to concern for further decline.  Also, advised to notify GI of changes since the earlier visit, along with derangements from earlier consult values.    Action taken as advised, Patient is now in ICU with Lab draws scheduled recurring until attending rounds and GI rounds in AM.     Gershon Cull MSNA MSN ACNPC-AG Acute Care Nurse Practitioner [place]

## 2022-04-24 NOTE — Progress Notes (Signed)
[facility] Gastroenterology Progress Note  Deanna Mcmillan 51 y.o. 02-May-1971   Subjective: Patient seen and examined sitting in chair, mother at bedside.  Patient notes some lower abdominal cramping, she has had a couple bowel movement since starting lactulose.  Denies melena, hematochezia.   Objective: Vital signs in last 24 hours: Vitals:   04/24/22 1200 04/24/22 1330  BP: (!) 124/58 (!) 115/55  Pulse: 72 83  Resp:  18  Temp:  98.2 F (36.8 C)  SpO2:  97%    Physical Exam:  General:  Alert, cooperative, no distress, appears stated age, deeply icteric  Head:  Normocephalic, without obvious abnormality, atraumatic  Eyes:  icteric sclera, EOM's intact  Lungs:   Clear to auscultation bilaterally, respirations unlabored  Heart:  Regular rate and rhythm, S1, S2 normal  Abdomen:   Soft, non-tender, bowel sounds active all four quadrants,  no masses,   Extremities: Extremities normal, atraumatic, no  edema  Pulses: 2+ and symmetric    Lab Results: Recent Labs    04/23/22 0559 04/24/22 0503  NA 122* 120*  K 3.2* 4.0  CL 94* 87*  CO2 20* 21*  GLUCOSE 106* 123*  BUN 34* 36*  CREATININE 0.65 0.71  CALCIUM 7.2* 9.2  MG 2.2 2.6*  PHOS 2.7 3.0   Recent Labs    04/23/22 0559 04/24/22 0503  AST 243* 260*  ALT 89* 114*  ALKPHOS 85 115  BILITOT 19.4* 23.5*  PROT 5.1* 6.5  ALBUMIN 2.2* 2.7*   Recent Labs    04/22/22 2140 04/23/22 0453 04/24/22 0503  WBC 11.3* 8.3 10.9*  NEUTROABS 9.6*  --  9.6*  HGB 10.1* 8.0* 9.6*  HCT 27.0* 22.1* 26.5*  MCV 104.7* 106.3* 106.4*  PLT 174 145* 178   Recent Labs    04/22/22 2155 04/23/22 0426  LABPROT 19.1* 19.6*  INR 1.6* 1.7*      Assessment Acute Alcoholic hepatitis Alcohol abuse Elevated LFTs Hyperbilirubinemia   HGB 9.6 Platelets 178 WBC 10.9 AST 260 ALT 114  Alkphos 115 TBili 23.5(27.9) INR 04/23/2022 1.7   On admission DF: 52.1   UA with moderate bilirubin, no UTI  Chest X-ray: mild bibasilar  atelectasis RUS [place]: hepatic steatosis, gallbladder sludge   Patient with significant history of heavy alcohol use 6 weeks ago. Her last drink was 9/15, she has increasing jaundice and fatigue since quitting alcohol. DF of 52.1 and her history are suggestive of alcoholic hepatitis. Other sources of infection have been evaluated with UA and Chest xray and are negative.   Was started on prednisolone 40 mg daily for total of 28 days on 04/23/2022.  T. bili has improved with steroid therapy.     Plan: Continue to monitor T. bili, if it continues to improve patient can continue prednisolone 40 mg daily as outpatient with close follow-up for repeat labs. Hopefully can go home tomorrow Continue thiamine, multivitamin, folic acid Continue simethicone 80 mg tablets for gas pains Continue lactulose 20 g twice daily for constipation [facility] will follow.  Arvella Nigh Olufemi Mofield PA-C 04/24/2022, 3:10 PM  Contact #  [phone]

## 2022-04-24 NOTE — Progress Notes (Signed)
Mobility Specialist - Progress Note   04/24/22 1249  Mobility  Activity Ambulated with assistance in hallway  Activity Response Tolerated well  Distance Ambulated (ft) 300 ft  $Mobility charge 1 Mobility  Level of Assistance Standby assist, set-up cues, supervision of patient - no hands on  Assistive Device Front wheel walker  Mobility Referral Yes   Pt received in chair and agreed to mobility.  Per nurse request instructed to mobilize pt on RA and record spO2 findings.  Patient Saturations on Room Air at Rest = spO2 98% Patient Saturations on Room Air while Ambulating = spO2 97% .   Reported findings to nurse and left pt on RA. No c/o pain nor dizziness, returned early due to fatigue, requested again soon. Pt released gas during ambulation and claimed it felt great. Pt back to chair with all needs met and family in room.   Roderick Pee Mobility Specialist

## 2022-04-24 NOTE — Consult Note (Signed)
Consultation Note Date: 04/24/2022   Patient Name: Deanna Mcmillan  DOB: 1971/07/20  MRN: [id number]  Age / Sex: 51 y.o., female  PCP: Pcp, No Referring Physician: Allie Bossier, MD  Reason for Consultation:  patient EtOH abuse/EtOH liver disease MELD score 28 evaluate patient change status to DNR, discussed short-term vs long-term goals of care, appointment HCPOA, will  HPI/Patient Profile: 51 y.o. female  with past medical history of significant ETOH use, chronic anemia has been received vitamin B12 shots in the past admitted on 06/25/5808 with alcoholic hepatitis.  Calculated meld-NA is 30, discriminant function is 52.  Has been evaluated by GI and started on prednisone.  Palliative medicine consulted for above.  Primary Decision Maker PATIENT  Discussion: I have reviewed medical records including Care Everywhere, progress notes from this and prior admissions, labs and imaging, discussed with RN.  On evaluation patient is sitting up in the chair, she is awake alert and oriented.  I introduced Palliative Medicine as specialized medical care for people living with serious illness. It focuses on providing relief from the symptoms and stress of a serious illness. The goal is to improve quality of life for both the patient and the family.  We discussed a brief life review of the patient.  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  Currently works from home.  Divorced.  Has 1 son named Liane Comber.  He is 20 and is working on getting his Warden/ranger.  As far as functional and nutritional status she is independent with ADLs but these have become difficult given her significant fatigue.  She lives at home with her son.  We discussed patient's current illness and what it means in the larger context of patient's on-going co-morbidities.  Natural disease  trajectory and expectations at EOL were discussed.  We discussed the trajectory and prognosis for patients with her level of hepatic impairment.  She understands her 5-year survival rate is increased if she is able to abstain from alcohol, however if she relapses then her survival rate significantly decreases. We also discussed developments that could independently increase her risk of mortality which include infections, renal failure, hepatic encephalopathy.   I attempted to elicit values and goals of care important to the patient.  She wishes to continue working and being able to resume interacting with friends and family.  She complains of significant depression and menopause symptoms she is interested in receiving treatment for both.  She is committed to abstinence from alcohol and is open to counseling services to support her in this.  Advance directives and code status were discussed.  She would like to designate her mother as her healthcare power of attorney.  She would like to maintain full CODE STATUS.  Discussed with patient/family the importance of continued conversation with family and the medical providers regarding overall plan of care and treatment options, ensuring decisions are within the context of the patient's values and GOCs.    SUMMARY OF RECOMMENDATIONS -  Depression and anxiety-start paroxetine 10 mg p.o. daily-we discussed the importance of her following up with her PCP after she is discharged to continue on this, she is also interested in starting outpatient counseling for her depression and alcohol use -Full code -Full scope interventions -Advance directive paperwork left at bedside for patient to complete -Spiritual care consult to assist with completing advanced directives  Code Status/Advance Care Planning: Full code   Prognosis:   Unable to determine  Discharge Planning:  Home  Primary Diagnoses: Present on Admission:  Alcoholic liver disease, unspecified  ([place])  Hypoalbuminemia  Hyponatremia  Alcohol abuse  QT prolongation  Hypokalemia   Review of Systems  Constitutional:  Positive for activity change and fatigue.  Psychiatric/Behavioral:  Positive for dysphoric mood. Negative for suicidal ideas.     Physical Exam Vitals and nursing note reviewed.  Eyes:     General: Scleral icterus present.  Pulmonary:     Effort: Pulmonary effort is normal.  Skin:    Coloration: Skin is jaundiced.  Neurological:     Mental Status: She is alert and oriented to person, place, and time.  Psychiatric:        Mood and Affect: Mood normal.        Thought Content: Thought content normal.     Comments: Appropriately tearful at times     Vital Signs: BP (!) 107/55   Pulse 82   Temp 97.7 F (36.5 C) (Oral)   Resp 20   Ht 5\' 4"  (1.626 m)   Wt 99.8 kg   SpO2 100%   BMI 37.76 kg/m  Pain Scale: 0-10   Pain Score: 7    SpO2: SpO2: 100 % O2 Device:SpO2: 100 % O2 Flow Rate: .   IO: Intake/output summary:  Intake/Output Summary (Last 24 hours) at 04/24/2022 1224 Last data filed at 04/23/2022 2103 Gross per 24 hour  Intake 360 ml  Output --  Net 360 ml    LBM: Last BM Date : 04/23/22 Baseline Weight: Weight: 99.8 kg Most recent weight: Weight: 99.8 kg       Thank you for this consult. Palliative medicine will continue to follow and assist as needed.   Greater than 50%  of this time was spent counseling and coordinating care related to the above assessment and plan.  Signed by: Mariana Kaufman, AGNP-C Palliative Medicine    Please contact Palliative Medicine Team phone at [phone] for questions and concerns.  For individual provider: See Shea Evans

## 2022-04-24 NOTE — Progress Notes (Signed)
Deanna Mcmillan KCM:[id number] DOB: 1970/08/18 DOA: 04/22/2022 PCP: Merryl Hacker, No   Subj: Deanna Mcmillan is a 51 y.o. WF PMHx ETOH abuse     Presented with  jaundice and leg edema  Weakness, fatigue, body aches for a week  Undergone detox from etoh 3 wks ago Now notes jaundice and leg swelling Reports SOB At baseline drinks 1.5 L of wine per day Last etoh was 3 wks ago Did so by herself at home  No seizures   Pt does not smoke  Very dedicated to stop drinking as well Reports her tremors have resolved     Liver disease MELD 3.0: 28 at 04/22/2022  9:55 PM   Chronic anemia - baseline hg Hemoglobin & Hematocrit    Obj: 10/3 afebrile overnight.  Positive abdominal pain postprandial, positive nausea.  Some earlier flatulence and small episodes of BM but nothing lately during the day with continued/increasing pain.    Objective: VITAL SIGNS: Temp: 97.7 F (36.5 C) (10/03 0532) Temp Source: Oral (10/03 0532) BP: 107/55 (10/03 1000) Pulse Rate: 82 (10/03 1000) SPO2; FIO2:   Intake/Output Summary (Last 24 hours) at 04/24/2022 1057 Last data filed at 04/23/2022 2103 Gross per 24 hour  Intake 360 ml  Output --  Net 360 ml     Exam: General: A/O x4, No acute respiratory distress Eyes: negative scleral hemorrhage, negative anisocoria, Positive icterus Lungs: Clear to auscultation bilaterally without wheezes or crackles Cardiovascular: Regular rate and rhythm without murmur gallop or rub normal S1 and S2 Abdomen: MORBID OBESITY, negative nontender, nondistended, soft, bowel sounds positive, no rebound, no ascites, no appreciable mass Extremities: No significant cyanosis, clubbing, or edema bilateral lower extremities Skin: Positive jaundice, negative rashes, lesions, ulcers Psychiatric:  Negative depression, negative anxiety, negative fatigue, negative mania  Central nervous system:  Cranial nerves II through XII intact, tongue/uvula midline, all extremities muscle strength 5/5,  sensation intact throughout, negative dysarthria, negative expressive aphasia, negative receptive aphasia.  .   Mobility Assessment (last 72 hours)     Mobility Assessment     Row Name 04/23/22 2103 04/23/22 1526         Does patient have an order for bedrest or is patient medically unstable No - Continue assessment --      What is the highest level of mobility based on the progressive mobility assessment? Level 5 (Walks with assist in room/hall) - Balance while stepping forward/back and can walk in room with assist - Complete Level 4 (Walks with assist in room) - Balance while marching in place and cannot step forward and back - Complete                 DVT prophylaxis:  Code Status: Full Family Communication: 10/2 son at bedside for discussion plan of care all questions answered Status is: Inpatient    Dispo: The patient is from: Home              Anticipated d/c is to: Home              Anticipated d/c date is: > 3 days              Patient currently is not medically stable to d/c.    Procedures/Significant Events:    Consultants:  [facility] Dr Ronnette Juniper   Cultures 10/2 Hepatitis panel pending  Antimicrobials:   A/P  Alcoholic liver disease, unspecified ([place]) -MELD score= 28 -Per [facility] Prednisolone 40 mg once daily for a total  of 28 days -[facility] will follow along - Monitor for downward trend in T. bili - Continue low-sodium diet -Hepatitis panel pending - Tylenol/salicylate negative - 35/4 urine drug screen negative -Overall poor prognosis   Alcohol abuse -Patient currently states in remission. -CIWA protocol  - 10/2 EtOH level<10   QTc Prolongation --Will monitor on tele avoid QT prolonging medications, rehydrate correct electrolytes  Obesity (BMI 37.76 kg/m.) -  Hypoalbuminemia -In the setting of likely hepatic cirrhosis.   -We will administer albumin to help with fluid status.  Anemia - 10/3 occult blood pending - 10/3  anemia panel pending -10/3 LDH/occult blood pending Lab Results  Component Value Date   HGB 9.6 (L) 04/24/2022   HGB 8.0 (L) 04/23/2022   HGB 10.1 (L) 04/22/2022   HGB 12.5 04/12/2013   HGB 15.6 (H) 04/11/2013  -10/3 trending up  Elevated bilirubin -Secondary to EtOH abuse/EtOH liver disease - See EtOH liver disease -Elevated bilirubin is true elevation.  Latest Reference Range & Units 04/23/22 05:59 04/24/22 05:03 04/24/22 17:53  Total Bilirubin 0.3 - 1.2 mg/dL 19.4 (HH) 23.5 (HH) 25.1 (HH)  ([place]): Data is critically high  DM type II uncontrolled with hyperglycemia vs hyperglycemia of steroids -10/3 moderate SSI CBG (last 3)  Recent Labs    04/24/22 1702 04/24/22 2032  GLUCAP 115* 133*    Acute EtOH pancreatitis - 10/3 lipase consistent with acute pancreatitis - 10/3 n.p.o. except for medication - 10/3 trend lipase - Dilaudid IV PRN   Hyponatremia -In the setting of alcoholic liver disease most likely cirrhosis.   -10/3 normal saline 195ml/hr -10/3 DC Paxil SSRI can lower sodium -10/3 transferred to ICU will give patient small dose of 3% saline to arrest serious decline of sodium however, patient at high risk for hepatorenal syndrome. - 10/3 night cross cover is reconsulting GI fellow to see patient tonight, patient may need to be transferred to [facility] or another tertiary care center   Hypokalemia -Potassium goal> 4 - 10/2 Potassium IV 60 mEq  Hypocalcemia - Calcium goal> 8.9 - 10/2 Calcium IV 2 g    Goals of care - 10/2 Palliative Care consult patient EtOH abuse/EtOH liver disease MELD score 28 evaluate patient change status to DNR, discussed short-term vs long-term goals of care, appointment HCPOA, Will          Care during the described time interval was provided by me .  I have reviewed this patient's available data, including medical history, events of note, physical examination, and all test results as part of my evaluation.

## 2022-04-25 DIAGNOSIS — F321 Major depressive disorder, single episode, moderate: Secondary | ICD-10-CM

## 2022-04-25 LAB — CBC WITH DIFFERENTIAL/PLATELET
Abs Immature Granulocytes: 0.05 10*3/uL (ref 0.00–0.07)
Basophils Absolute: 0 10*3/uL (ref 0.0–0.1)
Basophils Relative: 0 %
Eosinophils Absolute: 0 10*3/uL (ref 0.0–0.5)
Eosinophils Relative: 0 %
HCT: 28.3 % — ABNORMAL LOW (ref 36.0–46.0)
Hemoglobin: 8.8 g/dL — ABNORMAL LOW (ref 12.0–15.0)
Immature Granulocytes: 1 %
Lymphocytes Relative: 5 %
Lymphs Abs: 0.5 10*3/uL — ABNORMAL LOW (ref 0.7–4.0)
MCH: 38.9 pg — ABNORMAL HIGH (ref 26.0–34.0)
MCHC: 31.1 g/dL (ref 30.0–36.0)
MCV: 125.2 fL — ABNORMAL HIGH (ref 80.0–100.0)
Monocytes Absolute: 0.5 10*3/uL (ref 0.1–1.0)
Monocytes Relative: 5 %
Neutro Abs: 9 10*3/uL — ABNORMAL HIGH (ref 1.7–7.7)
Neutrophils Relative %: 89 %
Platelets: 145 10*3/uL — ABNORMAL LOW (ref 150–400)
RBC: 2.26 MIL/uL — ABNORMAL LOW (ref 3.87–5.11)
RDW: 19.4 % — ABNORMAL HIGH (ref 11.5–15.5)
WBC: 10 10*3/uL (ref 4.0–10.5)
nRBC: 0 % (ref 0.0–0.2)

## 2022-04-25 LAB — RETICULOCYTES
Immature Retic Fract: 3.8 % (ref 2.3–15.9)
RBC.: 2.41 MIL/uL — ABNORMAL LOW (ref 3.87–5.11)
Retic Count, Absolute: 69.2 10*3/uL (ref 19.0–186.0)
Retic Ct Pct: 2.9 % (ref 0.4–3.1)

## 2022-04-25 LAB — COMPREHENSIVE METABOLIC PANEL
ALT: 128 U/L — ABNORMAL HIGH (ref 0–44)
AST: 325 U/L — ABNORMAL HIGH (ref 15–41)
Albumin: 2.6 g/dL — ABNORMAL LOW (ref 3.5–5.0)
Alkaline Phosphatase: 109 U/L (ref 38–126)
Anion gap: 12 (ref 5–15)
BUN: 35 mg/dL — ABNORMAL HIGH (ref 6–20)
CO2: 19 mmol/L — ABNORMAL LOW (ref 22–32)
Calcium: 8.9 mg/dL (ref 8.9–10.3)
Chloride: 91 mmol/L — ABNORMAL LOW (ref 98–111)
Creatinine, Ser: 0.58 mg/dL (ref 0.44–1.00)
GFR, Estimated: >60 mL/min (ref >60)
Glucose, Bld: 112 mg/dL — ABNORMAL HIGH (ref 70–99)
Potassium: 4.2 mmol/L (ref 3.5–5.1)
Sodium: 122 mmol/L — ABNORMAL LOW (ref 135–145)
Total Bilirubin: 22.7 mg/dL (ref 0.3–1.2)
Total Protein: 6.4 g/dL — ABNORMAL LOW (ref 6.5–8.1)

## 2022-04-25 LAB — BASIC METABOLIC PANEL
Anion gap: 12 (ref 5–15)
BUN: 34 mg/dL — ABNORMAL HIGH (ref 6–20)
CO2: 20 mmol/L — ABNORMAL LOW (ref 22–32)
Calcium: 9 mg/dL (ref 8.9–10.3)
Chloride: 89 mmol/L — ABNORMAL LOW (ref 98–111)
Creatinine, Ser: 0.52 mg/dL (ref 0.44–1.00)
GFR, Estimated: >60 mL/min (ref >60)
Glucose, Bld: 124 mg/dL — ABNORMAL HIGH (ref 70–99)
Potassium: 3.8 mmol/L (ref 3.5–5.1)
Sodium: 121 mmol/L — ABNORMAL LOW (ref 135–145)

## 2022-04-25 LAB — IRON AND TIBC
Iron: 95 ug/dL (ref 28–170)
Saturation Ratios: 90 % — ABNORMAL HIGH (ref 10.4–31.8)
TIBC: 106 ug/dL — ABNORMAL LOW (ref 250–450)
UIBC: 11 ug/dL

## 2022-04-25 LAB — LIPID PANEL
Cholesterol: 207 mg/dL — ABNORMAL HIGH (ref 0–200)
HDL: <10 mg/dL — ABNORMAL LOW (ref >40)
Triglycerides: 182 mg/dL — ABNORMAL HIGH (ref <150)
VLDL: 36 mg/dL (ref 0–40)

## 2022-04-25 LAB — GLUCOSE, CAPILLARY
Glucose-Capillary: 105 mg/dL — ABNORMAL HIGH (ref 70–99)
Glucose-Capillary: 147 mg/dL — ABNORMAL HIGH (ref 70–99)
Glucose-Capillary: 115 mg/dL — ABNORMAL HIGH (ref 70–99)
Glucose-Capillary: 133 mg/dL — ABNORMAL HIGH (ref 70–99)
Glucose-Capillary: 131 mg/dL — ABNORMAL HIGH (ref 70–99)
Glucose-Capillary: 125 mg/dL — ABNORMAL HIGH (ref 70–99)

## 2022-04-25 LAB — VITAMIN B12: Vitamin B-12: 1923 pg/mL — ABNORMAL HIGH (ref 180–914)

## 2022-04-25 LAB — URINALYSIS, ROUTINE W REFLEX MICROSCOPIC
Glucose, UA: NEGATIVE mg/dL
Hgb urine dipstick: NEGATIVE
Ketones, ur: NEGATIVE mg/dL
Leukocytes,Ua: NEGATIVE
Nitrite: NEGATIVE
Protein, ur: NEGATIVE mg/dL
Specific Gravity, Urine: 1.014 (ref 1.005–1.030)
pH: 5 (ref 5.0–8.0)

## 2022-04-25 LAB — LIPASE, BLOOD: Lipase: 73 U/L — ABNORMAL HIGH (ref 11–51)

## 2022-04-25 LAB — LACTATE DEHYDROGENASE: LDH: 195 U/L — ABNORMAL HIGH (ref 98–192)

## 2022-04-25 LAB — SODIUM
Sodium: 121 mmol/L — ABNORMAL LOW (ref 135–145)
Sodium: 123 mmol/L — ABNORMAL LOW (ref 135–145)
Sodium: 124 mmol/L — ABNORMAL LOW (ref 135–145)

## 2022-04-25 LAB — CREATININE, URINE, RANDOM: Creatinine, Urine: 74 mg/dL

## 2022-04-25 LAB — MAGNESIUM: Magnesium: 2.7 mg/dL — ABNORMAL HIGH (ref 1.7–2.4)

## 2022-04-25 LAB — AMMONIA: Ammonia: 62 umol/L — ABNORMAL HIGH (ref 9–35)

## 2022-04-25 LAB — FOLATE: Folate: 6.8 ng/mL (ref >5.9)

## 2022-04-25 LAB — PHOSPHORUS: Phosphorus: 3.8 mg/dL (ref 2.5–4.6)

## 2022-04-25 LAB — SODIUM, URINE, RANDOM: Sodium, Ur: <10 mmol/L

## 2022-04-25 LAB — FERRITIN: Ferritin: 1767 ng/mL — ABNORMAL HIGH (ref 11–307)

## 2022-04-25 MED ORDER — SODIUM CHLORIDE 3 % IV SOLN: INTRAVENOUS | Status: DC

## 2022-04-25 MED ORDER — PANTOPRAZOLE SODIUM 40 MG IV SOLR
40.0000 mg | INTRAVENOUS | Status: DC
  Administered 2022-04-26 – 2022-04-27 (×2): 40 mg via INTRAVENOUS
  Filled 2022-04-25 (×2): qty 10

## 2022-04-25 MED ORDER — MIRTAZAPINE 15 MG PO TBDP
7.5000 mg | ORAL_TABLET | Freq: Every day | ORAL | Status: DC
  Filled 2022-04-25: qty 0.5

## 2022-04-25 MED ORDER — SODIUM CHLORIDE 3 % IV SOLN
INTRAVENOUS | Status: DC
  Filled 2022-04-25 (×2): qty 500

## 2022-04-25 MED ORDER — GUAIFENESIN-DM 100-10 MG/5ML PO SYRP
5.0000 mL | ORAL_SOLUTION | ORAL | Status: DC | PRN
  Administered 2022-04-25 (×2): 5 mL via ORAL
  Filled 2022-04-25 (×2): qty 10

## 2022-04-25 MED ORDER — POTASSIUM CHLORIDE 20 MEQ PO PACK
40.0000 meq | PACK | Freq: Once | ORAL | Status: AC
  Administered 2022-04-25: 40 meq via ORAL
  Filled 2022-04-25: qty 2

## 2022-04-25 MED ORDER — FUROSEMIDE 10 MG/ML IJ SOLN
20.0000 mg | Freq: Two times a day (BID) | INTRAMUSCULAR | Status: DC
  Administered 2022-04-25 – 2022-04-27 (×4): 20 mg via INTRAVENOUS
  Filled 2022-04-25 (×4): qty 2

## 2022-04-25 NOTE — Progress Notes (Signed)
[facility] Progress Note  Deanna Mcmillan 51 y.o. 1970/11/07   Subjective: Patient seen and examined sitting in chair, patient reports she still having bowel movements denies blood in stool.  No further abdominal pain today.  Patient was transferred to ICU overnight for worsening hyponatremia.  Objective: Vital signs in last 24 hours: Vitals:   04/25/22 0900 04/25/22 1000  BP: 131/60 (!) 127/45  Pulse: 88 79  Resp: 19 14  Temp:    SpO2: 98% 96%    Physical Exam:  General:  Alert, cooperative, no distress, appears stated age, deeply icteric  Head:  Normocephalic, without obvious abnormality, atraumatic  Eyes:  icteric sclera, EOM's intact  Lungs:   Clear to auscultation bilaterally, respirations unlabored  Heart:  Regular rate and rhythm, S1, S2 normal  Abdomen:   Soft, non-tender, bowel sounds active all four quadrants,  no masses,   Extremities: Extremities normal, atraumatic, no  edema  Pulses: 2+ and symmetric    Lab Results: Recent Labs    04/24/22 0503 04/24/22 1753 04/25/22 0341  NA 120* 119* 122*  K 4.0 3.6 4.2  CL 87* 87* 91*  CO2 21* 21* 19*  GLUCOSE 123* 121* 112*  BUN 36* 37* 35*  CREATININE 0.71 0.61 0.58  CALCIUM 9.2 9.1 8.9  MG 2.6*  --  2.7*  PHOS 3.0  --  3.8   Recent Labs    04/24/22 1753 04/25/22 0341  AST 303* 325*  ALT 126* 128*  ALKPHOS 121 109  BILITOT 25.1* 22.7*  PROT 6.9 6.4*  ALBUMIN 2.9* 2.6*   Recent Labs    04/24/22 0503 04/25/22 0341  WBC 10.9* 10.0  NEUTROABS 9.6* 9.0*  HGB 9.6* 8.8*  HCT 26.5* 28.3*  MCV 106.4* 125.2*  PLT 178 145*   Recent Labs    04/22/22 2155 04/23/22 0426  LABPROT 19.1* 19.6*  INR 1.6* 1.7*      Assessment Acute Alcoholic hepatitis Alcohol abuse Elevated LFTs Hyperbilirubinemia Hyponatremia  HGB 8.8(9.6) Platelets 145 AST 325(260) ALT 128(114)  Alkphos 109(115) TBili 22.7(23.5) GFR >60  INR 04/23/2022 1.7   On admission DF: 52.1   UA with moderate bilirubin,  no UTI  Chest X-ray: mild bibasilar atelectasis RUS [place]: hepatic steatosis, gallbladder sludge   Patient with significant history of heavy alcohol use 6 weeks ago. Her last drink was 9/15, she has increasing jaundice and fatigue since quitting alcohol. DF of 52.1 and her history are suggestive of alcoholic hepatitis. Other sources of infection have been evaluated with UA and Chest xray and are negative.    Was started on prednisolone 40 mg daily for total of 28 days on 04/23/2022.  T. bili has improved with steroid therapy.   Patient is hyponatremic, last labs Na 120, on admission Na 122.  No signs of GI bleed or encephalopathy.  Plan: Continue to monitor T. bili, if it continues to improve patient can continue prednisolone 40 mg daily as outpatient with close follow-up for repeat labs. Continue to correct hyponatremia, patient will likely need to stay admitted until improved.  Continue thiamine, multivitamin, folic acid Continue simethicone 80 mg tablets for gas pains Continue lactulose 20 g twice daily for constipation [facility] will follow.  Arvella Nigh Liron Eissler PA-C 04/25/2022, 11:16 AM  Contact #  [phone]

## 2022-04-25 NOTE — Progress Notes (Signed)
PROGRESS NOTE    [name]  XQJ:[id number] DOB: February 11, 1971 DOA: 04/22/2022 PCP: Pcp, No    Chief Complaint  Patient presents with   Weakness    Brief Narrative:  51 year old lady with prior history of EtOH abuse presented with jaundice and leg edema patient at baseline drinks about 1.5 L of wine per day.  She was admitted for alcoholic hepatitis and alcohol abuse  Assessment & Plan:   Principal Problem:   Alcoholic liver disease, unspecified ([place]) Active Problems:   Hypoalbuminemia   Hyponatremia   Alcohol abuse   QT prolongation   Hypokalemia   Serum total bilirubin elevated   Alcohol induced acute pancreatitis   Alcoholic liver disease/alcoholic hepatitis GI on board and recommended steroids 40 mg daily for total of 28 days.  Patient appears jaundiced elevated T bilirubin but improving when compared to the last 48 hours. Continue a low-sodium diet and recommend outpatient follow-up with GI on discharge.   Alcohol use disorder No withdrawal signs at this time.   Body mass index is 37.76 kg/m. Obesity    Hypoalbuminemia probably secondary to liver cirrhosis    Hyponatremia In the setting of alcohol liver disease. Paxil discontinued.  She was transferred to ICU for possible 3% saline yesterday.  Currently asymptomatic at this time Nephrology consulted for further recommendations.    Hypokalemia and hypocalcemia Replaced   Acute EtOH pancreatitis Patient's symptoms appears to have resolved Pain control    Type 2 diabetes mellitus with hyperglycemia probably secondary to steroids Continue sliding scale insulin while inpatient. CBG (last 3)  Recent Labs    04/25/22 0350 04/25/22 0749 04/25/22 1147  GLUCAP 105* 147* 115*     Anemia of chronic disease probably secondary to pill alcoholic liver disease.   DVT prophylaxis: SCDs Code Status: Full Family Communication: Family at bedside disposition:   Status is: Inpatient Remains  inpatient appropriate because: Hyponatremia   Level of care: ICU Consultants:  Gastroenterology Nephrology  Procedures: None  Antimicrobials: none.    Subjective: No nausea vomiting headache or abdominal pain  Objective: Vitals:   04/25/22 1100 04/25/22 1200 04/25/22 1202 04/25/22 1300  BP: 123/61 (!) 122/58  (!) 145/57  Pulse: 82 81  88  Resp: 20 15  19   Temp:   98.1 F (36.7 C)   TempSrc:   Oral   SpO2: 97% 94%  97%  Weight:      Height:        Intake/Output Summary (Last 24 hours) at 04/25/2022 1344 Last data filed at 04/25/2022 0400 Gross per 24 hour  Intake 2299.78 ml  Output --  Net 2299.78 ml   Filed Weights   04/22/22 2100  Weight: 99.8 kg    Examination:  General exam: Appears calm and comfortable  Respiratory system: Clear to auscultation. Respiratory effort normal. Cardiovascular system: S1 & S2 heard, RRR. No JVD, murmurs, No pedal edema. Gastrointestinal system: Abdomen is nondistended, soft and nontender. No organomegaly or masses felt. Normal bowel sounds heard. Central nervous system: Alert and oriented. No focal neurological deficits. Extremities: Symmetric 5 x 5 power. Skin: No rashes, lesions or ulcers Psychiatry: Mood & affect appropriate.     Data Reviewed: I have personally reviewed following labs and imaging studies  CBC: Recent Labs  Lab 04/22/22 2140 04/23/22 0453 04/24/22 0503 04/25/22 0341  WBC 11.3* 8.3 10.9* 10.0  NEUTROABS 9.6*  --  9.6* 9.0*  HGB 10.1* 8.0* 9.6* 8.8*  HCT 27.0* 22.1* 26.5* 28.3*  MCV 104.7* 106.3*  106.4* 125.2*  PLT 174 145* 178 145*    Basic Metabolic Panel: Recent Labs  Lab 04/22/22 2155 04/23/22 0559 04/24/22 0503 04/24/22 1753 04/25/22 0341 04/25/22 1045  NA  --  122* 120* 119* 122* 121*  121*  K  --  3.2* 4.0 3.6 4.2 3.8  CL  --  94* 87* 87* 91* 89*  CO2  --  20* 21* 21* 19* 20*  GLUCOSE  --  106* 123* 121* 112* 124*  BUN  --  34* 36* 37* 35* 34*  CREATININE  --  0.65 0.71 0.61  0.58 0.52  CALCIUM  --  7.2* 9.2 9.1 8.9 9.0  MG 2.9* 2.2 2.6*  --  2.7*  --   PHOS 3.4 2.7 3.0  --  3.8  --     GFR: Estimated Creatinine Clearance: 94.4 mL/min (by C-G formula based on SCr of 0.52 mg/dL).  Liver Function Tests: Recent Labs  Lab 04/22/22 2140 04/23/22 0559 04/24/22 0503 04/24/22 1753 04/25/22 0341  AST 316* 243* 260* 303* 325*  ALT 125* 89* 114* 126* 128*  ALKPHOS 117 85 115 121 109  BILITOT 27.9* 19.4* 23.5* 25.1* 22.7*  PROT 7.1 5.1* 6.5 6.9 6.4*  ALBUMIN 2.8* 2.2* 2.7* 2.9* 2.6*    CBG: Recent Labs  Lab 04/24/22 2032 04/24/22 2335 04/25/22 0350 04/25/22 0749 04/25/22 1147  GLUCAP 133* 122* 105* 147* 115*     Recent Results (from the past 240 hour(s))  MRSA Next Gen by PCR, Nasal     Status: None   Collection Time: 04/24/22 10:35 PM   Specimen: Nasal Mucosa; Nasal Swab  Result Value Ref Range Status   MRSA by PCR Next Gen NOT DETECTED NOT DETECTED Final    Comment: (NOTE) The GeneXpert MRSA Assay (FDA approved for NASAL specimens only), is one component of a comprehensive MRSA colonization surveillance program. It is not intended to diagnose MRSA infection nor to guide or monitor treatment for MRSA infections. Test performance is not FDA approved in patients less than 58 years old. Performed at [facility], [place] [address], [place], [place] [address]          Radiology Studies: DG Abd 1 View  Result Date: 04/24/2022 CLINICAL DATA:  Lower abdominal pain. EXAM: ABDOMEN - 1 VIEW COMPARISON:  None Available. FINDINGS: Air-filled ascending and transverse colon is seen. A paucity of air noted throughout the small bowel, as well as the descending and sigmoid colon. No significant stool burden is identified. No radio-opaque calculi or other significant radiographic abnormality are seen. IMPRESSION: Nonspecific bowel gas pattern, as described above. Further correlation with chest CT is recommended if an ileus or small-bowel  obstruction is of clinical concern. Electronically Signed   By: Virgina Norfolk M.D.   On: 04/24/2022 18:04   ECHOCARDIOGRAM COMPLETE  Result Date: 04/23/2022    ECHOCARDIOGRAM REPORT   Patient Name:   Deanna Mcmillan Date of Exam: 04/23/2022 Medical Rec #:  [id number]        Height:       64.0 in Accession #:    [id number]       Weight:       220.0 lb Date of Birth:  1971-01-21        BSA:          2.037 m Patient Age:    23 years         BP:           122/68 mmHg Patient  Gender: F                HR:           79 bpm. Exam Location:  Inpatient Procedure: 2D Echo Indications:    Dyspnea  History:        Patient has no prior history of Echocardiogram examinations.                 Signs/Symptoms:Shortness of Breath.  Sonographer:    Meryle Ready Referring Phys: [place]  1. Left ventricular ejection fraction, by estimation, is 60 to 65%. The left ventricle has normal function. The left ventricle has no regional wall motion abnormalities. Left ventricular diastolic parameters were normal.  2. Right ventricular systolic function is normal. The right ventricular size is normal. There is mildly elevated pulmonary artery systolic pressure. The estimated right ventricular systolic pressure is 23.5 mmHg.  3. The mitral valve is normal in structure. No evidence of mitral valve regurgitation.  4. The aortic valve was not well visualized. Aortic valve regurgitation is not visualized. No aortic stenosis is present.  5. The inferior vena cava is dilated in size with <50% respiratory variability, suggesting right atrial pressure of 15 mmHg. FINDINGS  Left Ventricle: Left ventricular ejection fraction, by estimation, is 60 to 65%. The left ventricle has normal function. The left ventricle has no regional wall motion abnormalities. The left ventricular internal cavity size was normal in size. There is  no left ventricular hypertrophy. Left ventricular diastolic parameters were normal. Right  Ventricle: The right ventricular size is normal. No increase in right ventricular wall thickness. Right ventricular systolic function is normal. There is mildly elevated pulmonary artery systolic pressure. The tricuspid regurgitant velocity is 2.47  m/s, and with an assumed right atrial pressure of 15 mmHg, the estimated right ventricular systolic pressure is 57.3 mmHg. Left Atrium: Left atrial size was normal in size. Right Atrium: Right atrial size was normal in size. Pericardium: There is no evidence of pericardial effusion. Mitral Valve: The mitral valve is normal in structure. No evidence of mitral valve regurgitation. Tricuspid Valve: The tricuspid valve is normal in structure. Tricuspid valve regurgitation is trivial. Aortic Valve: The aortic valve was not well visualized. Aortic valve regurgitation is not visualized. No aortic stenosis is present. Aortic valve mean gradient measures 7.0 mmHg. Aortic valve peak gradient measures 10.5 mmHg. Aortic valve area, by VTI measures 2.28 cm. Pulmonic Valve: The pulmonic valve was not well visualized. Pulmonic valve regurgitation is not visualized. Aorta: The aortic root and ascending aorta are structurally normal, with no evidence of dilitation. Venous: The inferior vena cava is dilated in size with less than 50% respiratory variability, suggesting right atrial pressure of 15 mmHg. IAS/Shunts: The interatrial septum was not well visualized.  LEFT VENTRICLE PLAX 2D LVIDd:         4.90 cm   Diastology LVIDs:         3.00 cm   LV e' medial:    14.30 cm/s LV PW:         1.00 cm   LV E/e' medial:  7.3 LV IVS:        0.90 cm   LV e' lateral:   16.50 cm/s LVOT diam:     2.00 cm   LV E/e' lateral: 6.4 LV SV:         82 LV SV Index:   40 LVOT Area:     3.14 cm  RIGHT VENTRICLE TAPSE (M-mode):  2.0 cm LEFT ATRIUM             Index        RIGHT ATRIUM           Index LA diam:        3.80 cm 1.87 cm/m   RA Area:     12.20 cm LA Vol (A2C):   38.7 ml 19.00 ml/m  RA Volume:    24.40 ml  11.98 ml/m LA Vol (A4C):   62.0 ml 30.43 ml/m LA Biplane Vol: 51.9 ml 25.48 ml/m  AORTIC VALVE AV Area (Vmax):    2.29 cm AV Area (Vmean):   2.06 cm AV Area (VTI):     2.28 cm AV Vmax:           162.00 cm/s AV Vmean:          125.000 cm/s AV VTI:            0.361 m AV Peak Grad:      10.5 mmHg AV Mean Grad:      7.0 mmHg LVOT Vmax:         118.00 cm/s LVOT Vmean:        82.000 cm/s LVOT VTI:          0.262 m LVOT/AV VTI ratio: 0.73  AORTA Ao Root diam: 3.30 cm Ao Asc diam:  3.20 cm MITRAL VALVE                TRICUSPID VALVE MV Area (PHT): 3.27 cm     TR Peak grad:   24.4 mmHg MV Decel Time: 232 msec     TR Vmax:        247.00 cm/s MV E velocity: 105.00 cm/s MV A velocity: 80.80 cm/s   SHUNTS MV E/A ratio:  1.30         Systemic VTI:  0.26 m                             Systemic Diam: 2.00 cm Oswaldo Milian MD Electronically signed by Oswaldo Milian MD Signature Date/Time: 04/23/2022/2:35:04 PM    Final         Scheduled Meds:  Chlorhexidine Gluconate Cloth  6 each Topical Daily   folic acid  1 mg Oral Daily   insulin aspart  0-15 Units Subcutaneous Q4H   lactulose  20 g Oral BID   LORazepam  0-4 mg Intravenous Q12H   multivitamin with minerals  1 tablet Oral Daily   [START ON 04/26/2022] pantoprazole (PROTONIX) IV  40 mg Intravenous Q24H   prednisoLONE  40 mg Oral Daily   thiamine  100 mg Oral Daily   Or   thiamine  100 mg Intravenous Daily   Continuous Infusions:   LOS: 2 days        Hosie Poisson, MD [facility]   To contact the attending provider between 7A-7P or the covering provider during after hours 7P-7A, please log into the web site [email or web address] and access using universal [place] password for that web site. If you do not have the password, please call the hospital operator.  04/25/2022, 1:44 PM

## 2022-04-25 NOTE — Consult Note (Signed)
Renal Service Consult Note [facility]  Deanna Mcmillan 04/25/2022 Deanna Blazing, MD Requesting Physician: Dr. Karleen Mcmillan  Reason for Consult: Hyponatremia  HPI: The patient is a 51 y.o. year-old w/ hx of NASH, etoh abuse, depression who presented on 10/1 w/ gen'd weakness, sp etoh detox 3 wks ago, also rash on body and yellowing of the eyes, swelling in the legs. At baseline drinks 1.5L of wine per day, last etoh 3 wks ago. Pt admitted for alcoholic hepatitis and GI consulted. MELD score 28 on admission. Started on po prednisone 40 mg qd. Alb low and Na+ has been low around 120-121 here. Asked to see for hyponatremia.   Pt seen in room, no c/o's today. No confusion, +fatigue, +dark urine and yellow skin, and sig swelling bilat LE's. No hx low sodium.    ROS - denies CP, no joint pain, no HA, no blurry vision, no rash, no diarrhea, no nausea/ vomiting, no dysuria, no difficulty voiding   Past Medical History History reviewed. No pertinent past medical history. Past Surgical History  Past Surgical History:  Procedure Laterality Date   KNEE ARTHROSCOPY W/ ACL RECONSTRUCTION Left 1994/5   Dr. Weston Mcmillan   LAPAROSCOPIC APPENDECTOMY N/A 04/11/2013   Procedure: APPENDECTOMY LAPAROSCOPIC;  Surgeon: Deanna Klein, MD;  Location: [place];  Service: General;  Laterality: N/A;   [place]   3 extractions   Family History History reviewed. No pertinent family history. Social History  reports that she has never smoked. She does not have any smokeless tobacco history on file. She reports current alcohol use. She reports that she does not use drugs. Allergies No Known Allergies Home medications Prior to Admission medications   Medication Sig Start Date End Date Taking? Authorizing Provider  acetaminophen (TYLENOL) 500 MG tablet Take 500 mg by mouth every 6 (six) hours as needed for headache or moderate pain.   Yes [provider]  oxyCODONE-acetaminophen  (PERCOCET/ROXICET) 5-325 MG per tablet Take 1-2 tablets by mouth every 4 (four) hours as needed. Patient not taking: Reported on 04/22/2022 04/12/13   Deanna Confer, MD     Vitals:   04/25/22 1100 04/25/22 1200 04/25/22 1202 04/25/22 1300  BP: 123/61 (!) 122/58  (!) 145/57  Pulse: 82 81  88  Resp: 20 15  19   Temp:   98.1 F (36.7 C)   TempSrc:   Oral   SpO2: 97% 94%  97%  Weight:      Height:       Exam Gen alert, no distress, jaundiced No rash, cyanosis or gangrene Sclera anicteric, throat clear  No jvd or bruits Chest clear bilat to bases, no rales/ wheezing RRR no MRG Abd soft ntnd no mass or ascites +bs GU defer MS no joint effusions or deformity Ext 2+ pitting bilat pretib edema Neuro is alert, Ox 3 , nf, no asterixis   Home meds include - percocet prn, tylenol prn    BP's 120 - 145/ 45- 65, MAP 81- 93, RR 20, afeb    RA 95%     I/O + 3.0 L, no UOP recorded     Na 121  K 3.8  CO2 20  BUN 34  cReat 0.52 glu 124  alb 2.6  AST 325 ALT 128     NH3 62  Tbili 22- 25       wBC 10K Hb 8.8  plt 145  Echo LVEF 60-65%       CXR - mild bibasilar atelectasis       RUQ US - hepatic steatosis, GB sludge, normal portal vein flow    Assessment/ Plan: Hyponatremia - as consequence of liver disease most likely. Pt does not have cirrhosis per GI eval. Albumin low 2.6. Is not symptomatic at this time.  Echo shows normal LVEF and creatinine is normal as well. Has +LE edema but clear lungs. Tolvaptan not a good option w/ liver disease. Will treat cautiously w/ 3% saline for slow correction 5-6 mEq per 24 hrs. Also will give low dose IV lasix for her vol overload to help avoid worsening w/ the 3%. Have d/w pt and mother and questions answered.  Etoh hepatitis - getting steroid therapy, GI following Etoh abuse - last drink 3 wks ago Vol overload - not severe but sig LE edema, as above       Rob [name]  MD 04/25/2022, 4:11 PM Recent Labs  Lab 04/24/22 0503  04/24/22 1753 04/25/22 0341 04/25/22 1045  HGB 9.6*  --  8.8*  --   ALBUMIN 2.7* 2.9* 2.6*  --   CALCIUM 9.2 9.1 8.9 9.0  PHOS 3.0  --  3.8  --   CREATININE 0.71 0.61 0.58 0.52  K 4.0 3.6 4.2 3.8    Inpatient medications:  Chlorhexidine Gluconate Cloth  6 each Topical Daily   folic acid  1 mg Oral Daily   insulin aspart  0-15 Units Subcutaneous Q4H   lactulose  20 g Oral BID   LORazepam  0-4 mg Intravenous Q12H   mirtazapine  7.5 mg Oral QHS   multivitamin with minerals  1 tablet Oral Daily   [START ON 04/26/2022] pantoprazole (PROTONIX) IV  40 mg Intravenous Q24H   prednisoLONE  40 mg Oral Daily   thiamine  100 mg Oral Daily   Or   thiamine  100 mg Intravenous Daily    fentaNYL (SUBLIMAZE) injection, guaiFENesin-dextromethorphan, HYDROmorphone (DILAUDID) injection, LORazepam **OR** LORazepam, menthol-cetylpyridinium, simethicone

## 2022-04-25 NOTE — Progress Notes (Signed)
Physical Therapy Treatment Patient Details Name: Deanna Mcmillan MRN: [id number] DOB: 1971-07-22 Today's Date: 04/25/2022   History of Present Illness 51 yo female admitted with ETOH liver disease, jaundice, weakness. Hx of ETOH abuse    PT Comments    Pt is progressing well with mobility, she ambulated 200' with a RW, no loss of balance, vital signs stable. She does require assistance for sit to stand. Overall, much improved activity tolerance compared to prior session.    Recommendations for follow up therapy are one component of a multi-disciplinary discharge planning process, led by the attending physician.  Recommendations may be updated based on patient status, additional functional criteria and insurance authorization.  Follow Up Recommendations  No PT follow up     Assistance Recommended at Discharge PRN  Patient can return home with the following A little help with walking and/or transfers;A little help with bathing/dressing/bathroom;Assist for transportation   Equipment Recommendations  Rolling walker (2 wheels)    Recommendations for Other Services       Precautions / Restrictions Precautions Precautions: Fall Restrictions Weight Bearing Restrictions: No     Mobility  Bed Mobility Overal bed mobility: Modified Independent Bed Mobility: Supine to Sit     Supine to sit: Modified independent (Device/Increase time), HOB elevated     General bed mobility comments: used rail    Transfers Overall transfer level: Needs assistance Equipment used: Rolling walker (2 wheels) Transfers: Sit to/from Stand Sit to Stand: Min assist           General transfer comment: VCs for hand placement, min A to power up and steady RW 2* posterior lean    Ambulation/Gait Ambulation/Gait assistance: Supervision Gait Distance (Feet): 200 Feet Assistive device: Rolling walker (2 wheels) Gait Pattern/deviations: Step-through pattern, Decreased stride length Gait velocity:  decr     General Gait Details: vital signs stable, no dizziness, no loss of balance, slow cadence   Stairs             Wheelchair Mobility    Modified Rankin (Stroke Patients Only)       Balance Overall balance assessment: Needs assistance Sitting-balance support: Feet supported, No upper extremity supported Sitting balance-Leahy Scale: Good     Standing balance support: Reliant on assistive device for balance, During functional activity, Bilateral upper extremity supported Standing balance-Leahy Scale: Poor Standing balance comment: relies on BUE support in standing                            Cognition Arousal/Alertness: Awake/alert Behavior During Therapy: WFL for tasks assessed/performed Overall Cognitive Status: Within Functional Limits for tasks assessed                                          Exercises      General Comments        Pertinent Vitals/Pain Pain Assessment Pain Score: 4  Pain Location: stomach Pain Descriptors / Indicators: Aching Pain Intervention(s): Limited activity within patient's tolerance, Monitored during session, Repositioned    Home Living                          Prior Function            PT Goals (current goals can now be found in the care plan section) Acute Rehab PT Goals  Patient Stated Goal: to get stronger, be able to go to her niece's and nephew's sporting events PT Goal Formulation: With patient Time For Goal Achievement: 05/07/22 Potential to Achieve Goals: Good Progress towards PT goals: Progressing toward goals    Frequency    Min 3X/week      PT Plan Discharge plan needs to be updated    Co-evaluation              AM-PAC PT "6 Clicks" Mobility   Outcome Measure  Help needed turning from your back to your side while in a flat bed without using bedrails?: None Help needed moving from lying on your back to sitting on the side of a flat bed without using  bedrails?: A Little Help needed moving to and from a bed to a chair (including a wheelchair)?: A Little Help needed standing up from a chair using your arms (e.g., wheelchair or bedside chair)?: A Little Help needed to walk in hospital room?: None Help needed climbing 3-5 steps with a railing? : A Little 6 Click Score: 20    End of Session Equipment Utilized During Treatment: Gait belt Activity Tolerance: Patient tolerated treatment well Patient left: in bed;with call bell/phone within reach;with family/visitor present Nurse Communication: Mobility status PT Visit Diagnosis: Muscle weakness (generalized) (M62.81);Difficulty in walking, not elsewhere classified (R26.2)     Time: 7948-0165 PT Time Calculation (min) (ACUTE ONLY): 25 min  Charges:  $Gait Training: 8-22 mins $Therapeutic Activity: 8-22 mins                     Blondell Reveal Kistler PT 04/25/2022  Acute Rehabilitation Services  Office [phone]

## 2022-04-25 NOTE — Progress Notes (Signed)
Chaplain was paged to meet with patient and family to discuss advance directives.  They had the paperwork and Illona plans to fill it out this evening.  She would like follow up tomorrow to assist with getting it notarized.  Chaplain Janne Napoleon, [place] PAger, [phone]

## 2022-04-25 NOTE — Progress Notes (Signed)
Daily Progress Note   Patient Name: Deanna Mcmillan       Date: 04/25/2022 DOB: [birth year]  Age: 51 y.o. MRN#: [id number] Attending Physician: Hosie Poisson, MD Primary Care Physician: Pcp, No Admit Date: 04/22/2022  Reason for Consultation/Follow-up: Establishing goals of care  Patient Profile/HPI: 51 y.o. female  with past medical history of significant ETOH use, chronic anemia has been received vitamin B12 shots in the past admitted on 19/11/9745 with alcoholic hepatitis.  Calculated meld-NA is 30, discriminant function is 52.  Has been evaluated by GI and started on prednisone.  Palliative medicine consulted for above.    Subjective: Noted events- patient transferred to ICU for hyponatremia. Her ammonia is also elevated today. She is not confused Deanna Mcmillan is awake alert oriented. Having some gas pains, walked with PT today. Remembers our discussion from yesterday. Requested new DNR packet.  Discussed management of her depression- given hyponatremia paxil has to be dc'd. After consulting with pharmacy decision made that best treatment for her depression would be mirtazipine considering her prolonged QT interval, hyponatremia and hepatic insufficiency.      Physical Exam Vitals and nursing note reviewed.  Eyes:     General: Scleral icterus present.  Skin:    Coloration: Skin is jaundiced.  Neurological:     Mental Status: She is alert and oriented to person, place, and time.  Psychiatric:        Mood and Affect: Mood normal.        Behavior: Behavior normal.        Thought Content: Thought content normal.        Judgment: Judgment normal.             Vital Signs: BP (!) 145/57   Pulse 88   Temp 98.1 F (36.7 C) (Oral)   Resp 19   Ht 5\' 4"  (1.626 m)   Wt 99.8 kg   SpO2 97%    BMI 37.76 kg/m  SpO2: SpO2: 97 % O2 Device: O2 Device: Room Air O2 Flow Rate:    Intake/output summary:  Intake/Output Summary (Last 24 hours) at 04/25/2022 1353 Last data filed at 04/25/2022 0400 Gross per 24 hour  Intake 2299.78 ml  Output --  Net 2299.78 ml   LBM: Last BM Date : 04/25/22 Baseline Weight: Weight: 99.8 kg Most recent  weight: Weight: 99.8 kg       Palliative Assessment/Data:      Patient Active Problem List   Diagnosis Date Noted   Serum total bilirubin elevated 04/24/2022   Alcohol induced acute pancreatitis 12/75/1700   Alcoholic liver disease, unspecified ([place]) 04/23/2022   Hypoalbuminemia 04/23/2022   Hyponatremia 04/23/2022   Alcohol abuse 04/23/2022   QT prolongation 04/23/2022   Hypokalemia 04/23/2022   Appendicitis, acute s/p laparoscopic appendectomy 04/11/13 04/12/2013    Palliative Care Assessment & Plan    Assessment/Recommendations/Plan  Start mirtazapine 7.5mg  QHS for depression/anxiety Appreciate Chaplain assistance with AD completion PMT will continue to follow    Code Status: Full code  Prognosis:  Unable to determine  Discharge Planning: To Be Determined  Care plan was discussed with patient and care team.  Thank you for allowing the Palliative Medicine Team to assist in the care of this patient.  Greater than 50%  of this time was spent counseling and coordinating care related to the above assessment and plan.  Mariana Kaufman, AGNP-C Palliative Medicine   Please contact Palliative Medicine Team phone at [phone] for questions and concerns.

## 2022-04-26 DIAGNOSIS — Z515 Encounter for palliative care: Secondary | ICD-10-CM

## 2022-04-26 LAB — SODIUM
Sodium: 126 mmol/L — ABNORMAL LOW (ref 135–145)
Sodium: 126 mmol/L — ABNORMAL LOW (ref 135–145)
Sodium: 127 mmol/L — ABNORMAL LOW (ref 135–145)
Sodium: 129 mmol/L — ABNORMAL LOW (ref 135–145)

## 2022-04-26 LAB — GLUCOSE, CAPILLARY
Glucose-Capillary: 110 mg/dL — ABNORMAL HIGH (ref 70–99)
Glucose-Capillary: 103 mg/dL — ABNORMAL HIGH (ref 70–99)
Glucose-Capillary: 101 mg/dL — ABNORMAL HIGH (ref 70–99)
Glucose-Capillary: 123 mg/dL — ABNORMAL HIGH (ref 70–99)
Glucose-Capillary: 120 mg/dL — ABNORMAL HIGH (ref 70–99)

## 2022-04-26 LAB — CBC WITH DIFFERENTIAL/PLATELET
Abs Immature Granulocytes: 0.05 10*3/uL (ref 0.00–0.07)
Basophils Absolute: 0 10*3/uL (ref 0.0–0.1)
Basophils Relative: 0 %
Eosinophils Absolute: 0 10*3/uL (ref 0.0–0.5)
Eosinophils Relative: 0 %
HCT: 24.2 % — ABNORMAL LOW (ref 36.0–46.0)
Hemoglobin: 8.4 g/dL — ABNORMAL LOW (ref 12.0–15.0)
Immature Granulocytes: 1 %
Lymphocytes Relative: 5 %
Lymphs Abs: 0.4 10*3/uL — ABNORMAL LOW (ref 0.7–4.0)
MCH: 38.7 pg — ABNORMAL HIGH (ref 26.0–34.0)
MCHC: 34.7 g/dL (ref 30.0–36.0)
MCV: 111.5 fL — ABNORMAL HIGH (ref 80.0–100.0)
Monocytes Absolute: 0.7 10*3/uL (ref 0.1–1.0)
Monocytes Relative: 8 %
Neutro Abs: 7.4 10*3/uL (ref 1.7–7.7)
Neutrophils Relative %: 86 %
Platelets: 156 10*3/uL (ref 150–400)
RBC: 2.17 MIL/uL — ABNORMAL LOW (ref 3.87–5.11)
RDW: 18.5 % — ABNORMAL HIGH (ref 11.5–15.5)
WBC: 8.6 10*3/uL (ref 4.0–10.5)
nRBC: 0 % (ref 0.0–0.2)

## 2022-04-26 LAB — OSMOLALITY, URINE: Osmolality, Ur: 450 mOsm/kg (ref 300–900)

## 2022-04-26 LAB — COMPREHENSIVE METABOLIC PANEL
ALT: 143 U/L — ABNORMAL HIGH (ref 0–44)
AST: 364 U/L — ABNORMAL HIGH (ref 15–41)
Albumin: 2.4 g/dL — ABNORMAL LOW (ref 3.5–5.0)
Alkaline Phosphatase: 98 U/L (ref 38–126)
Anion gap: 9 (ref 5–15)
BUN: 30 mg/dL — ABNORMAL HIGH (ref 6–20)
CO2: 21 mmol/L — ABNORMAL LOW (ref 22–32)
Calcium: 8.4 mg/dL — ABNORMAL LOW (ref 8.9–10.3)
Chloride: 95 mmol/L — ABNORMAL LOW (ref 98–111)
Creatinine, Ser: 0.39 mg/dL — ABNORMAL LOW (ref 0.44–1.00)
GFR, Estimated: >60 mL/min (ref >60)
Glucose, Bld: 111 mg/dL — ABNORMAL HIGH (ref 70–99)
Potassium: 4 mmol/L (ref 3.5–5.1)
Sodium: 125 mmol/L — ABNORMAL LOW (ref 135–145)
Total Bilirubin: 20.2 mg/dL (ref 0.3–1.2)
Total Protein: 5.9 g/dL — ABNORMAL LOW (ref 6.5–8.1)

## 2022-04-26 LAB — PHOSPHORUS: Phosphorus: 3.7 mg/dL (ref 2.5–4.6)

## 2022-04-26 LAB — HEMOGLOBIN A1C
Hgb A1c MFr Bld: <4.2 % — ABNORMAL LOW (ref 4.8–5.6)
Mean Plasma Glucose: <74 mg/dL

## 2022-04-26 LAB — OSMOLALITY: Osmolality: 267 mOsm/kg — ABNORMAL LOW (ref 275–295)

## 2022-04-26 LAB — LIPASE, BLOOD: Lipase: 59 U/L — ABNORMAL HIGH (ref 11–51)

## 2022-04-26 LAB — AMMONIA: Ammonia: 71 umol/L — ABNORMAL HIGH (ref 9–35)

## 2022-04-26 LAB — MAGNESIUM: Magnesium: 2.6 mg/dL — ABNORMAL HIGH (ref 1.7–2.4)

## 2022-04-26 MED ORDER — SODIUM CHLORIDE 3 % IV SOLN
INTRAVENOUS | Status: DC
  Filled 2022-04-26: qty 500

## 2022-04-26 MED ORDER — MIRTAZAPINE 15 MG PO TABS: 15.0000 mg | ORAL_TABLET | Freq: Every day | ORAL | Status: DC

## 2022-04-26 MED ORDER — LACTULOSE 10 GM/15ML PO SOLN
20.0000 g | Freq: Three times a day (TID) | ORAL | Status: DC
  Administered 2022-04-26 – 2022-04-27 (×2): 20 g via ORAL
  Filled 2022-04-26 (×2): qty 30

## 2022-04-26 NOTE — Progress Notes (Signed)
[facility] Gastroenterology Progress Note  Deanna Mcmillan 51 y.o. [birth year]   Subjective: Patient examined and seen laying in bed. She is feeling well. No abdominal pain. She is tolerating clear liquid diet well.     Objective: Vital signs in last 24 hours: Vitals:   04/26/22 1100 04/26/22 1200  BP: 132/63   Pulse:    Resp: 14   Temp:  97.9 F (36.6 C)  SpO2:      Physical Exam:  General:  Alert, cooperative, no distress, appears stated age, deeply icteric  Head:  Normocephalic, without obvious abnormality, atraumatic  Eyes:  icteric sclera, EOM's intact  Lungs:   Clear to auscultation bilaterally, respirations unlabored  Heart:  Regular rate and rhythm, S1, S2 normal  Abdomen:   Soft, non-tender, bowel sounds active all four quadrants,  no masses,   Extremities: Extremities normal, atraumatic, no  edema  Pulses: 2+ and symmetric    Lab Results: Recent Labs    04/25/22 0341 04/25/22 1045 04/25/22 1824 04/26/22 0304 04/26/22 0844 04/26/22 1053  NA 122* 121*  121*   < > 125* 126* 126*  K 4.2 3.8  --  4.0  --   --   CL 91* 89*  --  95*  --   --   CO2 19* 20*  --  21*  --   --   GLUCOSE 112* 124*  --  111*  --   --   BUN 35* 34*  --  30*  --   --   CREATININE 0.58 0.52  --  0.39*  --   --   CALCIUM 8.9 9.0  --  8.4*  --   --   MG 2.7*  --   --  2.6*  --   --   PHOS 3.8  --   --  3.7  --   --    < > = values in this interval not displayed.   Recent Labs    04/25/22 0341 04/26/22 0304  AST 325* 364*  ALT 128* 143*  ALKPHOS 109 98  BILITOT 22.7* 20.2*  PROT 6.4* 5.9*  ALBUMIN 2.6* 2.4*   Recent Labs    04/25/22 0341 04/26/22 0304  WBC 10.0 8.6  NEUTROABS 9.0* 7.4  HGB 8.8* 8.4*  HCT 28.3* 24.2*  MCV 125.2* 111.5*  PLT 145* 156   No results for input(s): "LABPROT", "INR" in the last 72 hours.    Assessment Acute Alcoholic hepatitis Alcohol abuse Elevated LFTs Hyperbilirubinemia Hyponatremia   HGB 8.4(8.8) Platelets 156(145) AST 364(325)  ALT 143(128)  Alkphos 98(109) TBili 20.2(22.7, 23.5) GFR >60  INR 04/23/2022 1.7    On admission DF: 52.1   UA with moderate bilirubin, no UTI  Chest X-ray: mild bibasilar atelectasis RUS [place]: hepatic steatosis, gallbladder sludge   Patient with significant history of heavy alcohol use 6 weeks ago. Her last drink was 9/15, she has increasing jaundice and fatigue since quitting alcohol. DF of 52.1 and her history are suggestive of alcoholic hepatitis. Other sources of infection have been evaluated with UA and Chest xray and are negative.    Was started on prednisolone 40 mg daily for total of 28 days on 04/23/2022.  T. bili has improved with steroid therapy.    Patient is hyponatremic, last labs Na 126, on admission Na 122.   No signs of GI bleed or encephalopathy.   Plan: Continue to monitor T. bili, if it continues to improve patient can continue prednisolone 40 mg daily  as outpatient with close follow-up for repeat labs. Continue to correct hyponatremia, patient will likely need to stay admitted until improved.  Continue thiamine, multivitamin, folic acid Continue simethicone 80 mg tablets for gas pains Continue lactulose 20 g twice daily for constipation [facility] will follow.    Arvella Nigh Chaka Jefferys PA-C 04/26/2022, 1:05 PM  Contact #  [phone]

## 2022-04-26 NOTE — Progress Notes (Signed)
PROGRESS NOTE    Deanna Mcmillan  ZYS:[id number] DOB: 08/13/1970 DOA: 04/22/2022 PCP: Pcp, No    Chief Complaint  Patient presents with   Weakness    Brief Narrative:  51 year old lady with prior history of EtOH abuse presented with jaundice and leg edema patient at baseline drinks about 1.5 L of wine per day.  She was admitted for alcoholic hepatitis and alcohol abuse  Assessment & Plan:   Principal Problem:   Alcoholic liver disease, unspecified ([place]) Active Problems:   Hypoalbuminemia   Hyponatremia   Alcohol abuse   QT prolongation   Hypokalemia   Serum total bilirubin elevated   Alcohol induced acute pancreatitis   Alcoholic liver disease/alcoholic hepatitis GI on board and recommended steroids 40 mg daily for total of 28 days.  Patient appears jaundiced, elevated T bilirubin but improving . Recheck levels in am.  Continue a low-sodium diet and recommend outpatient follow-up with GI on discharge. Elevated ammonia levels,  increase to lactulose 20 tid.    Alcohol use disorder No withdrawal signs at this time.    Body mass index is 37.96 kg/m. Obesity    Hypoalbuminemia probably secondary to liver cirrhosis    Hyponatremia In the setting of alcohol liver disease. Paxil discontinued.  She was transferred to ICU for 3% saline.  Currently asymptomatic at this time Nephrology consulted for further recommendations. Sodium improved slowly from 119 to 122 to 124 to 125 to 126, 127.  On IV lasix 20 mg BID.  Continue to monitor.     Hypokalemia and hypocalcemia Replaced   Acute EtOH pancreatitis Patient's symptoms appears to have resolved Pain control    Type 2 diabetes mellitus with hyperglycemia probably secondary to steroids Continue sliding scale insulin while inpatient. CBG (last 3)  Recent Labs    04/26/22 0303 04/26/22 0809 04/26/22 1154  GLUCAP 110* 103* 101*      Anemia of chronic disease / Macrocytic anemia.  probably  secondary to alcoholic liver disease. Low folate levels.  Supplementation added.  Transfuse to keep hemoglobin greater than 7.    DVT prophylaxis: SCDs Code Status: Full Family Communication: Family at bedside  disposition:   Status is: Inpatient Remains inpatient appropriate because: Hyponatremia   Level of care: ICU Consultants:  Gastroenterology Nephrology  Procedures: None  Antimicrobials: none.    Subjective: , no new complaints today. No nausea or headache or abdominal pain.   Objective: Vitals:   04/26/22 0810 04/26/22 1000 04/26/22 1100 04/26/22 1200  BP:  116/62 132/63   Pulse:      Resp:  (!) 21 14   Temp: 98.1 F (36.7 C)   97.9 F (36.6 C)  TempSrc: Oral   Oral  SpO2:      Weight:      Height:        Intake/Output Summary (Last 24 hours) at 04/26/2022 1300 Last data filed at 04/26/2022 0753 Gross per 24 hour  Intake 442.66 ml  Output 400 ml  Net 42.66 ml    Filed Weights   04/22/22 2100 04/25/22 1624  Weight: 99.8 kg 100.3 kg    Examination:  General exam: Appears calm and comfortable  Respiratory system: Clear to auscultation. Respiratory effort normal. Cardiovascular system: S1 & S2 heard, RRR. No JVD,  No pedal edema. Gastrointestinal system: Abdomen is soft and mildly distended. .  Normal bowel sounds heard. Central nervous system: Alert and oriented. No focal neurological deficits. Extremities: Symmetric 5 x 5 power. Skin: No rashes, lesions or  ulcers Psychiatry:  Mood & affect appropriate.      Data Reviewed: I have personally reviewed following labs and imaging studies  CBC: Recent Labs  Lab 04/22/22 2140 04/23/22 0453 04/24/22 0503 04/25/22 0341 04/26/22 0304  WBC 11.3* 8.3 10.9* 10.0 8.6  NEUTROABS 9.6*  --  9.6* 9.0* 7.4  HGB 10.1* 8.0* 9.6* 8.8* 8.4*  HCT 27.0* 22.1* 26.5* 28.3* 24.2*  MCV 104.7* 106.3* 106.4* 125.2* 111.5*  PLT 174 145* 178 145* 156     Basic Metabolic Panel: Recent Labs  Lab  04/22/22 2155 04/23/22 0559 04/24/22 0503 04/24/22 1753 04/25/22 0341 04/25/22 1045 04/25/22 1824 04/25/22 2240 04/26/22 0304 04/26/22 0844 04/26/22 1053  NA  --  122* 120* 119* 122* 121*  121* 123* 124* 125* 126* 126*  K  --  3.2* 4.0 3.6 4.2 3.8  --   --  4.0  --   --   CL  --  94* 87* 87* 91* 89*  --   --  95*  --   --   CO2  --  20* 21* 21* 19* 20*  --   --  21*  --   --   GLUCOSE  --  106* 123* 121* 112* 124*  --   --  111*  --   --   BUN  --  34* 36* 37* 35* 34*  --   --  30*  --   --   CREATININE  --  0.65 0.71 0.61 0.58 0.52  --   --  0.39*  --   --   CALCIUM  --  7.2* 9.2 9.1 8.9 9.0  --   --  8.4*  --   --   MG 2.9* 2.2 2.6*  --  2.7*  --   --   --  2.6*  --   --   PHOS 3.4 2.7 3.0  --  3.8  --   --   --  3.7  --   --      GFR: Estimated Creatinine Clearance: 94.7 mL/min (A) (by C-G formula based on SCr of 0.39 mg/dL (L)).  Liver Function Tests: Recent Labs  Lab 04/23/22 0559 04/24/22 0503 04/24/22 1753 04/25/22 0341 04/26/22 0304  AST 243* 260* 303* 325* 364*  ALT 89* 114* 126* 128* 143*  ALKPHOS 85 115 121 109 98  BILITOT 19.4* 23.5* 25.1* 22.7* 20.2*  PROT 5.1* 6.5 6.9 6.4* 5.9*  ALBUMIN 2.2* 2.7* 2.9* 2.6* 2.4*     CBG: Recent Labs  Lab 04/25/22 1942 04/25/22 2306 04/26/22 0303 04/26/22 0809 04/26/22 1154  GLUCAP 131* 125* 110* 103* 101*      Recent Results (from the past 240 hour(s))  MRSA Next Gen by PCR, Nasal     Status: None   Collection Time: 04/24/22 10:35 PM   Specimen: Nasal Mucosa; Nasal Swab  Result Value Ref Range Status   MRSA by PCR Next Gen NOT DETECTED NOT DETECTED Final    Comment: (NOTE) The GeneXpert MRSA Assay (FDA approved for NASAL specimens only), is one component of a comprehensive MRSA colonization surveillance program. It is not intended to diagnose MRSA infection nor to guide or monitor treatment for MRSA infections. Test performance is not FDA approved in patients less than 49 years old. Performed at  [facility], [place] [address] [place], [place] [address]          Radiology Studies: DG Abd 1 View  Result Date: 04/24/2022 CLINICAL DATA:  Lower abdominal pain. EXAM: ABDOMEN - 1 VIEW COMPARISON:  None Available. FINDINGS: Air-filled ascending and transverse colon is seen. A paucity of air noted throughout the small bowel, as well as the descending and sigmoid colon. No significant stool burden is identified. No radio-opaque calculi or other significant radiographic abnormality are seen. IMPRESSION: Nonspecific bowel gas pattern, as described above. Further correlation with chest CT is recommended if an ileus or small-bowel obstruction is of clinical concern. Electronically Signed   By: Virgina Norfolk M.D.   On: 04/24/2022 18:04   ECHOCARDIOGRAM COMPLETE  Result Date: 04/23/2022    ECHOCARDIOGRAM REPORT   Patient Name:   Deanna Mcmillan Date of Exam: 04/23/2022 Medical Rec #:  [id number]        Height:       64.0 in Accession #:    [id number]       Weight:       220.0 lb Date of Birth:  Dec 16, 1970        BSA:          2.037 m Patient Age:    49 years         BP:           122/68 mmHg Patient Gender: F                HR:           79 bpm. Exam Location:  Inpatient Procedure: 2D Echo Indications:    Dyspnea  History:        Patient has no prior history of Echocardiogram examinations.                 Signs/Symptoms:Shortness of Breath.  Sonographer:    Meryle Ready Referring Phys: [place]  1. Left ventricular ejection fraction, by estimation, is 60 to 65%. The left ventricle has normal function. The left ventricle has no regional wall motion abnormalities. Left ventricular diastolic parameters were normal.  2. Right ventricular systolic function is normal. The right ventricular size is normal. There is mildly elevated pulmonary artery systolic pressure. The estimated right ventricular systolic pressure is 32.3 mmHg.  3. The mitral valve is normal in  structure. No evidence of mitral valve regurgitation.  4. The aortic valve was not well visualized. Aortic valve regurgitation is not visualized. No aortic stenosis is present.  5. The inferior vena cava is dilated in size with <50% respiratory variability, suggesting right atrial pressure of 15 mmHg. FINDINGS  Left Ventricle: Left ventricular ejection fraction, by estimation, is 60 to 65%. The left ventricle has normal function. The left ventricle has no regional wall motion abnormalities. The left ventricular internal cavity size was normal in size. There is  no left ventricular hypertrophy. Left ventricular diastolic parameters were normal. Right Ventricle: The right ventricular size is normal. No increase in right ventricular wall thickness. Right ventricular systolic function is normal. There is mildly elevated pulmonary artery systolic pressure. The tricuspid regurgitant velocity is 2.47  m/s, and with an assumed right atrial pressure of 15 mmHg, the estimated right ventricular systolic pressure is 55.7 mmHg. Left Atrium: Left atrial size was normal in size. Right Atrium: Right atrial size was normal in size. Pericardium: There is no evidence of pericardial effusion. Mitral Valve: The mitral valve is normal in structure. No evidence of mitral valve regurgitation. Tricuspid Valve: The tricuspid valve is normal in structure. Tricuspid valve regurgitation is trivial. Aortic Valve: The aortic valve was not well visualized. Aortic valve regurgitation  is not visualized. No aortic stenosis is present. Aortic valve mean gradient measures 7.0 mmHg. Aortic valve peak gradient measures 10.5 mmHg. Aortic valve area, by VTI measures 2.28 cm. Pulmonic Valve: The pulmonic valve was not well visualized. Pulmonic valve regurgitation is not visualized. Aorta: The aortic root and ascending aorta are structurally normal, with no evidence of dilitation. Venous: The inferior vena cava is dilated in size with less than 50%  respiratory variability, suggesting right atrial pressure of 15 mmHg. IAS/Shunts: The interatrial septum was not well visualized.  LEFT VENTRICLE PLAX 2D LVIDd:         4.90 cm   Diastology LVIDs:         3.00 cm   LV e' medial:    14.30 cm/s LV PW:         1.00 cm   LV E/e' medial:  7.3 LV IVS:        0.90 cm   LV e' lateral:   16.50 cm/s LVOT diam:     2.00 cm   LV E/e' lateral: 6.4 LV SV:         82 LV SV Index:   40 LVOT Area:     3.14 cm  RIGHT VENTRICLE TAPSE (M-mode): 2.0 cm LEFT ATRIUM             Index        RIGHT ATRIUM           Index LA diam:        3.80 cm 1.87 cm/m   RA Area:     12.20 cm LA Vol (A2C):   38.7 ml 19.00 ml/m  RA Volume:   24.40 ml  11.98 ml/m LA Vol (A4C):   62.0 ml 30.43 ml/m LA Biplane Vol: 51.9 ml 25.48 ml/m  AORTIC VALVE AV Area (Vmax):    2.29 cm AV Area (Vmean):   2.06 cm AV Area (VTI):     2.28 cm AV Vmax:           162.00 cm/s AV Vmean:          125.000 cm/s AV VTI:            0.361 m AV Peak Grad:      10.5 mmHg AV Mean Grad:      7.0 mmHg LVOT Vmax:         118.00 cm/s LVOT Vmean:        82.000 cm/s LVOT VTI:          0.262 m LVOT/AV VTI ratio: 0.73  AORTA Ao Root diam: 3.30 cm Ao Asc diam:  3.20 cm MITRAL VALVE                TRICUSPID VALVE MV Area (PHT): 3.27 cm     TR Peak grad:   24.4 mmHg MV Decel Time: 232 msec     TR Vmax:        247.00 cm/s MV E velocity: 105.00 cm/s MV A velocity: 80.80 cm/s   SHUNTS MV E/A ratio:  1.30         Systemic VTI:  0.26 m                             Systemic Diam: 2.00 cm Oswaldo Milian MD Electronically signed by Oswaldo Milian MD Signature Date/Time: 04/23/2022/2:35:04 PM    Final         Scheduled Meds:  Chlorhexidine Gluconate Cloth  6 each Topical Daily   folic acid  1 mg Oral Daily   furosemide  20 mg Intravenous Q12H   insulin aspart  0-15 Units Subcutaneous Q4H   lactulose  20 g Oral BID   LORazepam  0-4 mg Intravenous Q12H   multivitamin with minerals  1 tablet Oral Daily   pantoprazole  (PROTONIX) IV  40 mg Intravenous Q24H   prednisoLONE  40 mg Oral Daily   thiamine  100 mg Oral Daily   Or   thiamine  100 mg Intravenous Daily   Continuous Infusions:  sodium chloride (hypertonic) 20 mL/hr at 04/26/22 0910     LOS: 3 days        Hosie Poisson, MD [facility]   To contact the attending provider between 7A-7P or the covering provider during after hours 7P-7A, please log into the web site [email or web address] and access using universal [place] password for that web site. If you do not have the password, please call the hospital operator.  04/26/2022, 1:00 PM

## 2022-04-26 NOTE — Progress Notes (Addendum)
Daily Progress Note   Patient Name: Deanna Mcmillan       Date: 04/26/2022 DOB: November 16, 1970  Age: 51 y.o. MRN#: [id number] Attending Physician: Hosie Poisson, MD Primary Care Physician: Pcp, No Admit Date: 04/22/2022  Reason for Consultation/Follow-up: Establishing goals of care  Patient Profile/HPI: 52 y.o. female  with past medical history of significant ETOH use, chronic anemia has been received vitamin B12 shots in the past admitted on 23/01/6282 with alcoholic hepatitis.  Calculated meld-NA is 30, discriminant function is 52.  Has been evaluated by GI and started on prednisone.  Palliative medicine consulted for above.    Subjective: [place] in bed, son and mom at bedside. Has some grogginess today. Ammonia is increased. Some bowel spasms and having a lot of stool due to lactulose. No other complaints.   Physical Exam Vitals and nursing note reviewed.  Eyes:     General: Scleral icterus present.  Skin:    Coloration: Skin is jaundiced.  Neurological:     Mental Status: She is alert and oriented to person, place, and time.  Psychiatric:        Mood and Affect: Mood normal.        Behavior: Behavior normal.        Thought Content: Thought content normal.        Judgment: Judgment normal.             Vital Signs: BP (!) 122/39   Pulse 88   Temp 97.9 F (36.6 C) (Oral)   Resp 14   Ht 5\' 4"  (1.626 m)   Wt 100.3 kg   SpO2 98%   BMI 37.96 kg/m  SpO2: SpO2: 98 % O2 Device: O2 Device: Room Air O2 Flow Rate:    Intake/output summary:  Intake/Output Summary (Last 24 hours) at 04/26/2022 1448 Last data filed at 04/26/2022 0753 Gross per 24 hour  Intake 442.66 ml  Output 400 ml  Net 42.66 ml    LBM: Last BM Date : 04/26/22 Baseline Weight: Weight: 99.8 kg Most recent  weight: Weight: 100.3 kg       Palliative Assessment/Data: PPS: 50%      Patient Active Problem List   Diagnosis Date Noted   Serum total bilirubin elevated 04/24/2022   Alcohol induced acute pancreatitis 15/17/6160   Alcoholic liver disease, unspecified (  [place]) 04/23/2022   Hypoalbuminemia 04/23/2022   Hyponatremia 04/23/2022   Alcohol abuse 04/23/2022   QT prolongation 04/23/2022   Hypokalemia 04/23/2022   Appendicitis, acute s/p laparoscopic appendectomy 04/11/13 04/12/2013    Palliative Care Assessment & Plan    Assessment/Recommendations/Plan  Given grogginess today that could be related either to ammonia or mirtazapine- will increase mirtazapine to 15mg  QHS (due to the paradoxical effect where mirtazapine has increased drowsiness at lower doses that improves with increased dose) Patient has completed AD packet- appreciate Chaplain assistance in notorizing  Addendum- on review- patient did not receive mirtazapine last night- order d/c'd for unknown reason- so doubt grogginess is related to mirtazapine, however, will keep higher dose to avoid contributing to more drowsiness- mirtazapine chosen as therapy in consult with pharmacy due to its minimal impact on QT interval, and not likely to contribute to hyponatremia  Code Status: Full code  Prognosis:  Unable to determine  Discharge Planning: To Be Determined  Care plan was discussed with patient and care team.  Thank you for allowing the Palliative Medicine Team to assist in the care of this patient.  Greater than 50%  of this time was spent counseling and coordinating care related to the above assessment and plan.  Mariana Kaufman, AGNP-C Palliative Medicine   Please contact Palliative Medicine Team phone at [phone] for questions and concerns.

## 2022-04-26 NOTE — Progress Notes (Signed)
Noted the patient was noted to have vomited a bile colored emesis and was incontinent of urine and some stool. Pt reports hearing voices and being scared. Pt is noted to be anxious at this time.

## 2022-04-26 NOTE — Progress Notes (Signed)
Pharmacy: Re- 3% NaCL  Per order note from Dr. Jonnie Finner for hypertonic saline, "Hold infusion if Na+ > 128".  04/26/22 at 7p: Na level was 129  Plan: - d/c 3% NaCL infusion  Dia Sitter, PharmD, BCPS 04/26/2022 8:41 PM

## 2022-04-26 NOTE — Progress Notes (Addendum)
[facility] Progress Note  Subjective: no UOP recorded, pt has voided several times overnight but not collecting or recording  Vitals:   04/26/22 0200 04/26/22 0300 04/26/22 0344 04/26/22 0400  BP: (!) 111/58 (!) 97/39  (!) 89/38  Pulse:    76  Resp:    18  Temp:   99.2 F (37.3 C)   TempSrc:   Oral   SpO2:    98%  Weight:      Height:        Exam: Gen alert, no distress, jaundiced No jvd or bruits Chest clear bilat to bases RRR no MRG Abd soft ntnd no mass or ascites +bs GU defer MS no joint effusions or deformity Ext 2+ diffuse pitting bilat pretib edema Neuro is alert, Ox 3 , nf, no asterixis    Home meds include - percocet prn, tylenol prn       Echo LVEF 60-65%       CXR - mild bibasilar atelectasis       RUQ US - hepatic steatosis, GB sludge, normal portal vein flow       UA - negative       UNa < 10, UCr 74     Assessment/ Plan: Hyponatremia - Na+ 120 range, likely consequence of liver disease. Pt does not have cirrhosis per GI eval. Albumin 2.6. Is not symptomatic at this time.  UNa < 10, UCr 72, diffuse vol overload / LE edema. Echo normal LVEF and creatinine wnl. Tolvaptan not a good option w/ liver disease. Started 3% saline for yesterday , also started low dose IV lasix for LE edema. No UOP recorded but pt notes voiding well. Na+ up to 125 this am. Will lower 3% a bit to 24 cc/hr, shooting for 6- 8 pts improvement per 24 hrs. Will follow.  Etoh hepatitis - getting steroid therapy, GI following Etoh abuse - last drink 3 wks ago Vol overload - bilat diffuse LE edema, IV lasix as above       Rob Ilissa Rosner 04/26/2022, 7:27 AM   Recent Labs  Lab 04/25/22 0341 04/25/22 1045 04/26/22 0304  HGB 8.8*  --  8.4*  ALBUMIN 2.6*  --  2.4*  CALCIUM 8.9 9.0 8.4*  PHOS 3.8  --  3.7  CREATININE 0.58 0.52 0.39*  K 4.2 3.8 4.0   Recent Labs  Lab 04/23/22 0406 04/25/22 1045  IRON 92 95  TIBC 92* 106*  FERRITIN 1,802* 1,767*   Inpatient  medications:  Chlorhexidine Gluconate Cloth  6 each Topical Daily   folic acid  1 mg Oral Daily   furosemide  20 mg Intravenous Q12H   insulin aspart  0-15 Units Subcutaneous Q4H   lactulose  20 g Oral BID   LORazepam  0-4 mg Intravenous Q12H   multivitamin with minerals  1 tablet Oral Daily   pantoprazole (PROTONIX) IV  40 mg Intravenous Q24H   prednisoLONE  40 mg Oral Daily   thiamine  100 mg Oral Daily   Or   thiamine  100 mg Intravenous Daily    sodium chloride (hypertonic) 32 mL/hr at 04/26/22 0408   fentaNYL (SUBLIMAZE) injection, guaiFENesin-dextromethorphan, HYDROmorphone (DILAUDID) injection, menthol-cetylpyridinium, simethicone

## 2022-04-26 NOTE — Progress Notes (Signed)
Pt resting withouts signs of distress or disconfort. AM labs obtained at this time

## 2022-04-26 NOTE — Progress Notes (Signed)
Occupational Therapy Treatment Patient Details Name: Deanna Mcmillan MRN: [id number] DOB: 05/28/1971 Today's Date: 04/26/2022   History of present illness 51 yo female admitted with ETOH liver disease, jaundice, weakness. Hx of ETOH abuse   OT comments  Pt progressing towards OT goals this session. Able to don socks sitting EOB via figure 4, perform toilet transfer at min guard with vc for safe hand placement. Peri care in standing at min guard. Transfers with RW at min guard. Pt reporting that new medicine making her feel "very sleepy and tired" OT will continue to follow acutely. Will have good support from mother/son at dc. POC remains appropriate at this time.    Recommendations for follow up therapy are one component of a multi-disciplinary discharge planning process, led by the attending physician.  Recommendations may be updated based on patient status, additional functional criteria and insurance authorization.    Follow Up Recommendations  No OT follow up    Assistance Recommended at Discharge Set up Supervision/Assistance  Patient can return home with the following  A little help with walking and/or transfers   Equipment Recommendations  BSC/3in1    Recommendations for Other Services      Precautions / Restrictions Precautions Precautions: Fall Restrictions Weight Bearing Restrictions: No       Mobility Bed Mobility Overal bed mobility: Modified Independent             General bed mobility comments: uses rail, increased time    Transfers Overall transfer level: Needs assistance Equipment used: Rolling walker (2 wheels) Transfers: Sit to/from Stand Sit to Stand: Min guard           General transfer comment: vc for safe hand placement, rocks for momentum     Balance Overall balance assessment: Needs assistance Sitting-balance support: Feet supported, No upper extremity supported Sitting balance-Leahy Scale: Good     Standing balance support:  Reliant on assistive device for balance, During functional activity, Bilateral upper extremity supported Standing balance-Leahy Scale: Poor Standing balance comment: relies on BUE support in standing                           ADL either performed or assessed with clinical judgement   ADL Overall ADL's : Needs assistance/impaired     Grooming: Wash/dry hands;Wash/dry face;Set up;Sitting Grooming Details (indicate cue type and reason): in recliner             Lower Body Dressing: Min guard;Sitting/lateral leans Lower Body Dressing Details (indicate cue type and reason): able to perform figure 4 to don socks Toilet Transfer: Min guard;Ambulation;Rolling walker (2 wheels) Toilet Transfer Details (indicate cue type and reason): BSC, vc for safe hand placement Toileting- Clothing Manipulation and Hygiene: Min guard;Sit to/from stand Toileting - Clothing Manipulation Details (indicate cue type and reason): warm wash cloth     Functional mobility during ADLs: Min guard;Rolling walker (2 wheels)      Extremity/Trunk Assessment              Vision       Perception     Praxis      Cognition Arousal/Alertness: Awake/alert Behavior During Therapy: WFL for tasks assessed/performed Overall Cognitive Status: Within Functional Limits for tasks assessed  Exercises      Shoulder Instructions       General Comments mother present throughout session    Pertinent Vitals/ Pain       Pain Assessment Pain Assessment: 0-10 Pain Score: 2  Pain Location: stomach Pain Descriptors / Indicators: Aching Pain Intervention(s): Monitored during session, Repositioned  Home Living                                          Prior Functioning/Environment              Frequency  Min 2X/week        Progress Toward Goals  OT Goals(current goals can now be found in the care plan section)   Progress towards OT goals: Progressing toward goals  Acute Rehab OT Goals Patient Stated Goal: get better OT Goal Formulation: With patient Time For Goal Achievement: 05/08/22  Plan Discharge plan remains appropriate;Frequency remains appropriate    Co-evaluation                 AM-PAC OT "6 Clicks" Daily Activity     Outcome Measure   Help from another person eating meals?: None Help from another person taking care of personal grooming?: None Help from another person toileting, which includes using toliet, bedpan, or urinal?: A Little Help from another person bathing (including washing, rinsing, drying)?: A Little Help from another person to put on and taking off regular upper body clothing?: None Help from another person to put on and taking off regular lower body clothing?: A Little 6 Click Score: 21    End of Session Equipment Utilized During Treatment: Rolling walker (2 wheels);Gait belt  OT Visit Diagnosis: Unsteadiness on feet (R26.81);Muscle weakness (generalized) (M62.81)   Activity Tolerance Patient tolerated treatment well   Patient Left in chair;with call bell/phone within reach;with family/visitor present   Nurse Communication Mobility status        Time: 5465-0354 OT Time Calculation (min): 33 min  Charges: OT General Charges $OT Visit: 1 Visit OT Treatments $Self Care/Home Management : 23-37 mins  Jesse Sans OTR/L Acute Rehabilitation Services Office: [place] 04/26/2022, 1:07 PM

## 2022-04-27 DIAGNOSIS — Z7189 Other specified counseling: Secondary | ICD-10-CM

## 2022-04-27 DIAGNOSIS — F321 Major depressive disorder, single episode, moderate: Secondary | ICD-10-CM

## 2022-04-27 LAB — COMPREHENSIVE METABOLIC PANEL
ALT: 174 U/L — ABNORMAL HIGH (ref 0–44)
AST: 464 U/L — ABNORMAL HIGH (ref 15–41)
Albumin: 2.4 g/dL — ABNORMAL LOW (ref 3.5–5.0)
Alkaline Phosphatase: 98 U/L (ref 38–126)
Anion gap: 10 (ref 5–15)
BUN: 26 mg/dL — ABNORMAL HIGH (ref 6–20)
CO2: 21 mmol/L — ABNORMAL LOW (ref 22–32)
Calcium: 8.5 mg/dL — ABNORMAL LOW (ref 8.9–10.3)
Chloride: 98 mmol/L (ref 98–111)
Creatinine, Ser: 0.44 mg/dL (ref 0.44–1.00)
GFR, Estimated: >60 mL/min (ref >60)
Glucose, Bld: 120 mg/dL — ABNORMAL HIGH (ref 70–99)
Potassium: 3.7 mmol/L (ref 3.5–5.1)
Sodium: 129 mmol/L — ABNORMAL LOW (ref 135–145)
Total Bilirubin: 20.4 mg/dL (ref 0.3–1.2)
Total Protein: 5.9 g/dL — ABNORMAL LOW (ref 6.5–8.1)

## 2022-04-27 LAB — CBC WITH DIFFERENTIAL/PLATELET
Abs Immature Granulocytes: 0.05 10*3/uL (ref 0.00–0.07)
Basophils Absolute: 0 10*3/uL (ref 0.0–0.1)
Basophils Relative: 0 %
Eosinophils Absolute: 0 10*3/uL (ref 0.0–0.5)
Eosinophils Relative: 0 %
HCT: 23.6 % — ABNORMAL LOW (ref 36.0–46.0)
Hemoglobin: 8.3 g/dL — ABNORMAL LOW (ref 12.0–15.0)
Immature Granulocytes: 1 %
Lymphocytes Relative: 5 %
Lymphs Abs: 0.4 10*3/uL — ABNORMAL LOW (ref 0.7–4.0)
MCH: 39.7 pg — ABNORMAL HIGH (ref 26.0–34.0)
MCHC: 35.2 g/dL (ref 30.0–36.0)
MCV: 112.9 fL — ABNORMAL HIGH (ref 80.0–100.0)
Monocytes Absolute: 0.5 10*3/uL (ref 0.1–1.0)
Monocytes Relative: 6 %
Neutro Abs: 7.3 10*3/uL (ref 1.7–7.7)
Neutrophils Relative %: 88 %
Platelets: 154 10*3/uL (ref 150–400)
RBC: 2.09 MIL/uL — ABNORMAL LOW (ref 3.87–5.11)
RDW: 18.6 % — ABNORMAL HIGH (ref 11.5–15.5)
WBC: 8.2 10*3/uL (ref 4.0–10.5)
nRBC: 0 % (ref 0.0–0.2)

## 2022-04-27 LAB — GLUCOSE, CAPILLARY
Glucose-Capillary: 113 mg/dL — ABNORMAL HIGH (ref 70–99)
Glucose-Capillary: 122 mg/dL — ABNORMAL HIGH (ref 70–99)
Glucose-Capillary: 125 mg/dL — ABNORMAL HIGH (ref 70–99)
Glucose-Capillary: 134 mg/dL — ABNORMAL HIGH (ref 70–99)

## 2022-04-27 LAB — SODIUM: Sodium: 129 mmol/L — ABNORMAL LOW (ref 135–145)

## 2022-04-27 LAB — HAPTOGLOBIN: Haptoglobin: 77 mg/dL (ref 33–346)

## 2022-04-27 LAB — AMMONIA: Ammonia: 91 umol/L — ABNORMAL HIGH (ref 9–35)

## 2022-04-27 MED ORDER — LACTULOSE 10 GM/15ML PO SOLN: 20.0000 g | Freq: Three times a day (TID) | ORAL | 3 refills | Status: AC

## 2022-04-27 MED ORDER — FOLIC ACID 1 MG PO TABS: 1.0000 mg | ORAL_TABLET | Freq: Every day | ORAL | Status: AC

## 2022-04-27 MED ORDER — PANTOPRAZOLE SODIUM 40 MG PO TBEC: 40.0000 mg | DELAYED_RELEASE_TABLET | Freq: Every day | ORAL | 11 refills | Status: AC

## 2022-04-27 MED ORDER — VITAMIN B-1 100 MG PO TABS: 100.0000 mg | ORAL_TABLET | Freq: Every day | ORAL | Status: AC

## 2022-04-27 MED ORDER — ADULT MULTIVITAMIN W/MINERALS CH: 1.0000 | ORAL_TABLET | Freq: Every day | ORAL | Status: AC

## 2022-04-27 MED ORDER — SIMETHICONE 80 MG PO CHEW: 80.0000 mg | CHEWABLE_TABLET | Freq: Four times a day (QID) | ORAL | 0 refills | Status: AC | PRN

## 2022-04-27 MED ORDER — FUROSEMIDE 20 MG PO TABS: 20.0000 mg | ORAL_TABLET | Freq: Two times a day (BID) | ORAL | 11 refills | Status: DC

## 2022-04-27 MED ORDER — PREDNISOLONE 5 MG PO TABS: 40.0000 mg | ORAL_TABLET | Freq: Every day | ORAL | 0 refills | Status: DC

## 2022-04-27 NOTE — Discharge Summary (Addendum)
Physician Discharge Summary   Patient: Deanna Mcmillan MRN: [id number] DOB: Oct 05, 1970  Admit date:     04/22/2022  Discharge date: 04/27/22  Discharge Physician: Hosie Poisson   PCP: Pcp, No   Recommendations at discharge:  Please follow up with PCp on Monday.  Please check cbc and bmp in one week.  Please follow up with GI as recommended.  Please check liver enzymes in one week.   Discharge Diagnoses: Principal Problem:   Alcoholic liver disease, unspecified ([place]) Active Problems:   Hypoalbuminemia   Hyponatremia   Alcohol abuse   QT prolongation   Hypokalemia   Serum total bilirubin elevated   Alcohol induced acute pancreatitis   Advanced care planning/counseling discussion   Depression, major, single episode, moderate (HCC)   Goals of care, counseling/discussion    Hospital Course:  51 year old lady with prior history of EtOH abuse presented with jaundice and leg edema patient at baseline drinks about 1.5 L of wine per day.  She was admitted for alcoholic hepatitis and alcohol abuse   Assessment and Plan: Alcoholic liver disease/alcoholic hepatitis GI on board and recommended steroids 40 mg daily for total of 28 days.  Patient appears jaundiced, elevated T bilirubin but improving . Recheck levels in am.  Continue a low-sodium diet and recommend outpatient follow-up with GI on discharge. Elevated ammonia levels,  increase to lactulose 20 tid.     Alcohol use disorder No withdrawal signs at this time.       Body mass index is 37.96 kg/m. Obesity       Hypoalbuminemia probably secondary to liver cirrhosis       Hyponatremia In the setting of alcohol liver disease. Paxil discontinued.  She was transferred to ICU for 3% saline.  Currently asymptomatic at this time Nephrology consulted for further recommendations. Sodium improved slowly from 119 to 122 to 124 to 125 to 126, 127.  On IV lasix 20 mg BID transition to oral lasix on discharge.  Continue to  monitor.        Hypokalemia and hypocalcemia Replaced     Acute EtOH pancreatitis Patient's symptoms appears to have resolved        Type 2 diabetes mellitus with hyperglycemia probably secondary to steroids Last A1c is less than 4.2      Anemia of chronic disease / Macrocytic anemia.  probably secondary to alcoholic liver disease. Low folate levels.  Supplementation added.  Transfuse to keep hemoglobin greater than 7.            Consultants: gastroenterology Nephrology.  Procedures performed: none  Disposition: Home Diet recommendation:  Discharge Diet Orders (From admission, onward)     Start     Ordered   04/27/22 0000  Diet - low sodium heart healthy        04/27/22 1258           Regular diet DISCHARGE MEDICATION: Allergies as of 04/27/2022   No Known Allergies      Medication List     STOP taking these medications    oxyCODONE-acetaminophen 5-325 MG tablet Commonly known as: PERCOCET/ROXICET       TAKE these medications    acetaminophen 500 MG tablet Commonly known as: TYLENOL Take 500 mg by mouth every 6 (six) hours as needed for headache or moderate pain.   folic acid 1 MG tablet Commonly known as: FOLVITE Take 1 tablet (1 mg total) by mouth daily. Start taking on: April 28, 2022   furosemide 20 MG tablet  Commonly known as: Lasix Take 1 tablet (20 mg total) by mouth 2 (two) times daily.   lactulose 10 GM/15ML solution Commonly known as: CHRONULAC Take 30 mLs (20 g total) by mouth 3 (three) times daily.   multivitamin with minerals Tabs tablet Take 1 tablet by mouth daily. Start taking on: April 28, 2022   pantoprazole 40 MG tablet Commonly known as: Protonix Take 1 tablet (40 mg total) by mouth daily.   prednisoLONE 5 MG Tabs tablet Take 8 tablets (40 mg total) by mouth daily for 23 days. Start taking on: April 28, 2022   simethicone 80 MG chewable tablet Commonly known as: MYLICON Chew 1 tablet (80 mg total)  by mouth every 6 (six) hours as needed for flatulence.   thiamine 100 MG tablet Commonly known as: Vitamin B-1 Take 1 tablet (100 mg total) by mouth daily. Start taking on: April 28, 2022               Durable Medical Equipment  (From admission, onward)           Start     Ordered   04/27/22 1256  For home use only DME Walker rolling  Once       Question Answer Comment  Walker: With 5 Inch Wheels   Patient needs a walker to treat with the following condition Alcohol withdrawal ([place])      04/27/22 1255            Discharge Exam: Filed Weights   04/22/22 2100 04/25/22 1624 04/27/22 0500  Weight: 99.8 kg 100.3 kg 98.4 kg   General exam: Appears calm and comfortable  Respiratory system: Clear to auscultation. Respiratory effort normal. Cardiovascular system: S1 & S2 heard, RRR. No JVD, pedal edema. . Gastrointestinal system: Abdomen is soft, non tender Normal bowel sounds heard. Central nervous system: Alert and oriented. No focal neurological deficits. Extremities: Symmetric 5 x 5 power. Skin: No rashes,  Psychiatry:  Mood & affect appropriate.    Condition at discharge: fair  The results of significant diagnostics from this hospitalization (including imaging, microbiology, ancillary and laboratory) are listed below for reference.   Imaging Studies: DG Abd 1 View  Result Date: 04/24/2022 CLINICAL DATA:  Lower abdominal pain. EXAM: ABDOMEN - 1 VIEW COMPARISON:  None Available. FINDINGS: Air-filled ascending and transverse colon is seen. A paucity of air noted throughout the small bowel, as well as the descending and sigmoid colon. No significant stool burden is identified. No radio-opaque calculi or other significant radiographic abnormality are seen. IMPRESSION: Nonspecific bowel gas pattern, as described above. Further correlation with chest CT is recommended if an ileus or small-bowel obstruction is of clinical concern. Electronically Signed   By: Virgina Norfolk M.D.   On: 04/24/2022 18:04   ECHOCARDIOGRAM COMPLETE  Result Date: 04/23/2022    ECHOCARDIOGRAM REPORT   Patient Name:   Deanna Mcmillan Date of Exam: 04/23/2022 Medical Rec #:  [id number]        Height:       64.0 in Accession #:    [id number]       Weight:       220.0 lb Date of Birth:  [birth year]        BSA:          2.037 m Patient Age:    16 years         BP:           122/68 mmHg Patient Gender: F  HR:           79 bpm. Exam Location:  Inpatient Procedure: 2D Echo Indications:    Dyspnea  History:        Patient has no prior history of Echocardiogram examinations.                 Signs/Symptoms:Shortness of Breath.  Sonographer:    Meryle Ready Referring Phys: [place]  1. Left ventricular ejection fraction, by estimation, is 60 to 65%. The left ventricle has normal function. The left ventricle has no regional wall motion abnormalities. Left ventricular diastolic parameters were normal.  2. Right ventricular systolic function is normal. The right ventricular size is normal. There is mildly elevated pulmonary artery systolic pressure. The estimated right ventricular systolic pressure is 03.5 mmHg.  3. The mitral valve is normal in structure. No evidence of mitral valve regurgitation.  4. The aortic valve was not well visualized. Aortic valve regurgitation is not visualized. No aortic stenosis is present.  5. The inferior vena cava is dilated in size with <50% respiratory variability, suggesting right atrial pressure of 15 mmHg. FINDINGS  Left Ventricle: Left ventricular ejection fraction, by estimation, is 60 to 65%. The left ventricle has normal function. The left ventricle has no regional wall motion abnormalities. The left ventricular internal cavity size was normal in size. There is  no left ventricular hypertrophy. Left ventricular diastolic parameters were normal. Right Ventricle: The right ventricular size is normal. No increase in right ventricular  wall thickness. Right ventricular systolic function is normal. There is mildly elevated pulmonary artery systolic pressure. The tricuspid regurgitant velocity is 2.47  m/s, and with an assumed right atrial pressure of 15 mmHg, the estimated right ventricular systolic pressure is 46.5 mmHg. Left Atrium: Left atrial size was normal in size. Right Atrium: Right atrial size was normal in size. Pericardium: There is no evidence of pericardial effusion. Mitral Valve: The mitral valve is normal in structure. No evidence of mitral valve regurgitation. Tricuspid Valve: The tricuspid valve is normal in structure. Tricuspid valve regurgitation is trivial. Aortic Valve: The aortic valve was not well visualized. Aortic valve regurgitation is not visualized. No aortic stenosis is present. Aortic valve mean gradient measures 7.0 mmHg. Aortic valve peak gradient measures 10.5 mmHg. Aortic valve area, by VTI measures 2.28 cm. Pulmonic Valve: The pulmonic valve was not well visualized. Pulmonic valve regurgitation is not visualized. Aorta: The aortic root and ascending aorta are structurally normal, with no evidence of dilitation. Venous: The inferior vena cava is dilated in size with less than 50% respiratory variability, suggesting right atrial pressure of 15 mmHg. IAS/Shunts: The interatrial septum was not well visualized.  LEFT VENTRICLE PLAX 2D LVIDd:         4.90 cm   Diastology LVIDs:         3.00 cm   LV e' medial:    14.30 cm/s LV PW:         1.00 cm   LV E/e' medial:  7.3 LV IVS:        0.90 cm   LV e' lateral:   16.50 cm/s LVOT diam:     2.00 cm   LV E/e' lateral: 6.4 LV SV:         82 LV SV Index:   40 LVOT Area:     3.14 cm  RIGHT VENTRICLE TAPSE (M-mode): 2.0 cm LEFT ATRIUM             Index  RIGHT ATRIUM           Index LA diam:        3.80 cm 1.87 cm/m   RA Area:     12.20 cm LA Vol (A2C):   38.7 ml 19.00 ml/m  RA Volume:   24.40 ml  11.98 ml/m LA Vol (A4C):   62.0 ml 30.43 ml/m LA Biplane Vol: 51.9 ml  25.48 ml/m  AORTIC VALVE AV Area (Vmax):    2.29 cm AV Area (Vmean):   2.06 cm AV Area (VTI):     2.28 cm AV Vmax:           162.00 cm/s AV Vmean:          125.000 cm/s AV VTI:            0.361 m AV Peak Grad:      10.5 mmHg AV Mean Grad:      7.0 mmHg LVOT Vmax:         118.00 cm/s LVOT Vmean:        82.000 cm/s LVOT VTI:          0.262 m LVOT/AV VTI ratio: 0.73  AORTA Ao Root diam: 3.30 cm Ao Asc diam:  3.20 cm MITRAL VALVE                TRICUSPID VALVE MV Area (PHT): 3.27 cm     TR Peak grad:   24.4 mmHg MV Decel Time: 232 msec     TR Vmax:        247.00 cm/s MV E velocity: 105.00 cm/s MV A velocity: 80.80 cm/s   SHUNTS MV E/A ratio:  1.30         Systemic VTI:  0.26 m                             Systemic Diam: 2.00 cm Oswaldo Milian MD Electronically signed by Oswaldo Milian MD Signature Date/Time: 04/23/2022/2:35:04 PM    Final    US Abdomen Limited RUQ (LIVER/GB)  Result Date: 04/22/2022 CLINICAL DATA:  Liver disorder EXAM: ULTRASOUND ABDOMEN LIMITED RIGHT UPPER QUADRANT COMPARISON:  No prior ultrasound, correlation is made with CT abdomen pelvis 04/11/2013 FINDINGS: Gallbladder: Gallbladder sludge. No gallstones or wall thickening visualized. No sonographic Murphy sign noted by sonographer. Common bile duct: Diameter: 5 mm, within normal limits. No intrahepatic biliary ductal dilatation. Liver: No focal lesion identified. Increased parenchymal echogenicity with coarsened echotexture. Portal vein is patent on color Doppler imaging with normal direction of blood flow towards the liver. Other: None. IMPRESSION: 1. Hepatic steatosis. 2. Gallbladder sludge. Electronically Signed   By: Merilyn Baba M.D.   On: 04/22/2022 23:52   DG Chest Portable 1 View  Result Date: 04/22/2022 CLINICAL DATA:  Generalized weakness EXAM: PORTABLE CHEST 1 VIEW COMPARISON:  None Available. FINDINGS: Cardiac shadow is within normal limits. Mild bibasilar atelectasis is seen. No other focal infiltrate is noted.  No bony abnormality is seen. IMPRESSION: Mild bibasilar atelectasis. Electronically Signed   By: Inez Catalina M.D.   On: 04/22/2022 22:14    Microbiology: Results for orders placed or performed during the hospital encounter of 04/22/22  MRSA Next Gen by PCR, Nasal     Status: None   Collection Time: 04/24/22 10:35 PM   Specimen: Nasal Mucosa; Nasal Swab  Result Value Ref Range Status   MRSA by PCR Next Gen NOT DETECTED NOT DETECTED Final    Comment: (  NOTE) The GeneXpert MRSA Assay (FDA approved for NASAL specimens only), is one component of a comprehensive MRSA colonization surveillance program. It is not intended to diagnose MRSA infection nor to guide or monitor treatment for MRSA infections. Test performance is not FDA approved in patients less than 58 years old. Performed at [facility], [place] [address] [place], [place] [address]     Labs: CBC: Recent Labs  Lab 04/22/22 2140 04/23/22 0453 04/24/22 0503 04/25/22 0341 04/26/22 0304 04/27/22 0259  WBC 11.3* 8.3 10.9* 10.0 8.6 8.2  NEUTROABS 9.6*  --  9.6* 9.0* 7.4 7.3  HGB 10.1* 8.0* 9.6* 8.8* 8.4* 8.3*  HCT 27.0* 22.1* 26.5* 28.3* 24.2* 23.6*  MCV 104.7* 106.3* 106.4* 125.2* 111.5* 112.9*  PLT 174 145* 178 145* 156 614   Basic Metabolic Panel: Recent Labs  Lab 04/22/22 2155 04/23/22 0559 04/24/22 0503 04/24/22 1753 04/25/22 0341 04/25/22 1045 04/25/22 1824 04/26/22 0304 04/26/22 0844 04/26/22 1053 04/26/22 1547 04/26/22 1917 04/27/22 0000 04/27/22 0259  NA  --  122* 120* 119* 122* 121*  121*   < > 125*   < > 126* 127* 129* 129* 129*  K  --  3.2* 4.0 3.6 4.2 3.8  --  4.0  --   --   --   --   --  3.7  CL  --  94* 87* 87* 91* 89*  --  95*  --   --   --   --   --  98  CO2  --  20* 21* 21* 19* 20*  --  21*  --   --   --   --   --  21*  GLUCOSE  --  106* 123* 121* 112* 124*  --  111*  --   --   --   --   --  120*  BUN  --  34* 36* 37* 35* 34*  --  30*  --   --   --   --   --  26*   CREATININE  --  0.65 0.71 0.61 0.58 0.52  --  0.39*  --   --   --   --   --  0.44  CALCIUM  --  7.2* 9.2 9.1 8.9 9.0  --  8.4*  --   --   --   --   --  8.5*  MG 2.9* 2.2 2.6*  --  2.7*  --   --  2.6*  --   --   --   --   --   --   PHOS 3.4 2.7 3.0  --  3.8  --   --  3.7  --   --   --   --   --   --    < > = values in this interval not displayed.   Liver Function Tests: Recent Labs  Lab 04/24/22 0503 04/24/22 1753 04/25/22 0341 04/26/22 0304 04/27/22 0259  AST 260* 303* 325* 364* 464*  ALT 114* 126* 128* 143* 174*  ALKPHOS 115 121 109 98 98  BILITOT 23.5* 25.1* 22.7* 20.2* 20.4*  PROT 6.5 6.9 6.4* 5.9* 5.9*  ALBUMIN 2.7* 2.9* 2.6* 2.4* 2.4*   CBG: Recent Labs  Lab 04/26/22 2004 04/27/22 0036 04/27/22 0338 04/27/22 0749 04/27/22 1121  GLUCAP 120* 134* 122* 113* 125*    Discharge time spent: 43 minutes  Signed: Hosie Poisson, MD [facility] 04/27/2022

## 2022-04-27 NOTE — Progress Notes (Signed)
Deanna Mcmillan Progress Note  Subjective: 900 cc UOP recorded yest and today so far. Creat stable, Na+ 129 and 3% stopped overnight.   Vitals:   04/27/22 0340 04/27/22 0400 04/27/22 0500 04/27/22 0600  BP:  (!) 100/48 (!) 109/47 (!) 114/44  Pulse:  74 72 73  Resp:  11 (!) 9 10  Temp: 98.8 F (37.1 C)     TempSrc: Oral     SpO2:  93% 93% 93%  Weight:   98.4 kg   Height:        Exam: Gen alert, no distress, jaundiced No jvd or bruits Chest clear bilat to bases RRR no MRG Abd soft ntnd no mass or ascites +bs GU defer MS no joint effusions or deformity Ext 2+ diffuse pitting bilat pretib edema Neuro is alert, Ox 3 , nf, no asterixis    Home meds include - percocet prn, tylenol prn       Echo LVEF 60-65%       CXR - mild bibasilar atelectasis       RUQ US - hepatic steatosis, GB sludge, normal portal vein flow       UA - negative       UNa < 10, UCr 74     Assessment/ Plan: Hyponatremia - Na+ 120 range, likely consequence of liver disease. Pt does not have cirrhosis per GI eval. Albumin 2.6. Is not symptomatic at this time.  UNa < 10, UCr 72, diffuse vol overload / LE edema. Echo normal LVEF and creatinine wnl. Tolvaptan not a good option w/ liver disease. 3% saline was used to Rx low Na+ and Na+ improved to 125 yesterday and up to 129 this am. 3% stopped. Hyponatremia was never symptomatic and may (or may not) be a chronic issue w/ her hx of liver disease. At this point okay to dc pt home from renal standpoint. GI will be following for diuresis in OP setting and they are planning to see pt in 2 wks for labs.  No other suggestions, will sign off.  Etoh hepatitis - getting steroid therapy per GI Etoh abuse - last drink 3 wks ago Vol overload - bilat LE edema, no resp issues       Deanna Mcmillan 04/27/2022, 8:03 AM   Recent Labs  Lab 04/25/22 0341 04/25/22 1045 04/26/22 0304 04/27/22 0259  HGB 8.8*  --  8.4* 8.3*  ALBUMIN 2.6*  --  2.4* 2.4*  CALCIUM 8.9    < > 8.4* 8.5*  PHOS 3.8  --  3.7  --   CREATININE 0.58   < > 0.39* 0.44  K 4.2   < > 4.0 3.7   < > = values in this interval not displayed.    Recent Labs  Lab 04/23/22 0406 04/25/22 1045  IRON 92 95  TIBC 92* 106*  FERRITIN 1,802* 1,767*    Inpatient medications:  Chlorhexidine Gluconate Cloth  6 each Topical Daily   folic acid  1 mg Oral Daily   furosemide  20 mg Intravenous Q12H   insulin aspart  0-15 Units Subcutaneous Q4H   lactulose  20 g Oral TID   mirtazapine  15 mg Oral QHS   multivitamin with minerals  1 tablet Oral Daily   pantoprazole (PROTONIX) IV  40 mg Intravenous Q24H   prednisoLONE  40 mg Oral Daily   thiamine  100 mg Oral Daily   Or   thiamine  100 mg Intravenous Daily     fentaNYL (SUBLIMAZE)  injection, guaiFENesin-dextromethorphan, HYDROmorphone (DILAUDID) injection, menthol-cetylpyridinium, simethicone

## 2022-04-27 NOTE — Progress Notes (Signed)
[facility] Hospital Liaison Note  Notified by TOC/Winnie of patient/family request of [facility] Paliative services.  [facility] hospital liaison will follow patient for discharge disposition.   Please call with any questions/concerns.    Thank you for the opportunity to participate in this patient's care.   Shania Daniel, MSW [facility] Hospital Liaison  [phone]   

## 2022-04-27 NOTE — Progress Notes (Signed)
Chaplain assisted Irelynn with getting her advance directives notarized and witnessed.  A copy was placed into her paper chart and another was scanned and sent to [facility] documents. The original was returned to [facility].  Chaplain assessed for further spiritual needs.  Blonnie requested a prayer of gratitude for her good care and for being on the road to recovery. Chaplain provided emotional and spiritual support through prayer and listening.  [address], [place] Pager, [phone]

## 2022-04-27 NOTE — TOC Progression Note (Signed)
Transition of Care [facility]) - Progression Note    Patient Details  Name: Deanna Mcmillan MRN: [id number] Date of Birth: 1971-03-17  Transition of Care [facility]) CM/SW [place], RN Phone Number: 04/27/2022, 12:57 PM  Clinical Narrative:   Spoke with patient at bedside, offered choice for outpatient palliative services. Patient chose [facility]. Referral made to [facility] who will follow up with patient after discharge. Patient requested DME : rolling walker, notified MD, RN.  Patient chose [employer] or any DME supplier that accepts her insurance. Notified Danielle with [employer], awaiting response.    TOC will continue to follow.     Expected Discharge Plan: [place] Barriers to Discharge: Continued Medical Work up  Expected Discharge Plan and Services Expected Discharge Plan: [place] In-house Referral: Chaplain, Hospice / Palliative Care Discharge Planning Services: CM Consult Post Acute Care Choice: Durable Medical Equipment (rolling walker) Living arrangements for the past 2 months: Single Family Home                           HH Arranged: NA [place] Agency: NA         Social Determinants of Health (SDOH) Interventions    Readmission Risk Interventions    04/24/2022    1:49 PM  Readmission Risk Prevention Plan  Post Dischage Appt Complete  Medication Screening Complete  Transportation Screening Complete

## 2022-04-27 NOTE — TOC Transition Note (Signed)
Transition of Care [facility]) - CM/SW Discharge Note   Patient Details  Name: Deanna Mcmillan MRN: [id number] Date of Birth: [birth year]  Transition of Care [facility]) CM/SW Contact:  Roseanne Kaufman, RN Phone Number: 04/27/2022, 1:18 PM   Clinical Narrative:   Andee Poles with [employer] has delivered rolling walker to patient at bedside.  No additional TOC at this time.    Final next level of care: Home/Self Care Barriers to Discharge: No Barriers Identified   Patient Goals and CMS Choice Patient states their goals for this hospitalization and ongoing recovery are:: To return home with DME: rolling walker CMS Medicare.gov Compare Post Acute Care list provided to:: Patient Choice offered to / list presented to : Patient  Discharge Placement                       Discharge Plan and Services In-house Referral: Chaplain, Hospice / Palliative Care Discharge Planning Services: CM Consult Post Acute Care Choice: Durable Medical Equipment (rolling walker)          DME Arranged: Gilford Rile rolling DME Agency: [employer] Date DME Agency Contacted: 04/27/22 Time DME Agency Contacted: [id number] Representative spoke with at DME Agency: [place]: NA [place] Agency: NA        Social Determinants of Health ([place]) Interventions     Readmission Risk Interventions    04/24/2022    1:49 PM  Readmission Risk Prevention Plan  Post Dischage Appt Complete  Medication Screening Complete  Transportation Screening Complete

## 2022-04-27 NOTE — TOC Progression Note (Signed)
Transition of Care [facility]) - Progression Note    Patient Details  Name: Deanna Mcmillan MRN: [id number] Date of Birth: 1970-12-23  Transition of Care [facility]) CM/SW [place], RN Phone Number: 04/27/2022, 9:08 AM  Clinical Narrative:  PT did not recommend home health services at this time, TOC will continue to follow for DME: RW and TOC needs.       Expected Discharge Plan: [place] Barriers to Discharge: Continued Medical Work up  Expected Discharge Plan and Services Expected Discharge Plan: [place] In-house Referral: Chaplain, Hospice / Palliative Care Discharge Planning Services: CM Consult Post Acute Care Choice: Durable Medical Equipment, Home Health Living arrangements for the past 2 months: Single Family Home                           HH Arranged: NA HH Agency: NA         Social Determinants of Health (SDOH) Interventions    Readmission Risk Interventions    04/24/2022    1:49 PM  Readmission Risk Prevention Plan  Post Dischage Appt Complete  Medication Screening Complete  Transportation Screening Complete

## 2022-04-27 NOTE — Progress Notes (Signed)
[facility] Progress Note  Deanna Mcmillan 51 y.o. 1971-04-29   Subjective: Patient seen and examined laying in bed, tolerating regular diet well.  Notes she is plan to go home today after she has a bowel movement.  Notes she was lethargic Wednesday evening, nursing reports mild hallucinations, no issues with lethargy or confusion last night.    Objective: Vital signs in last 24 hours: Vitals:   04/27/22 0600 04/27/22 0800  BP: (!) 114/44   Pulse: 73   Resp: 10   Temp:  98 F (36.7 C)  SpO2: 93%     Physical Exam:  General:  Alert, cooperative, no distress, appears stated age, deeply icteric  Head:  Normocephalic, without obvious abnormality, atraumatic  Eyes:  Icteric sclera, EOM's intact  Lungs:   Clear to auscultation bilaterally, respirations unlabored  Heart:  Regular rate and rhythm, S1, S2 normal  Abdomen:   Soft, non-tender, bowel sounds active all four quadrants,  no masses,   Extremities: Extremities normal, atraumatic, no  edema  Pulses: 2+ and symmetric    Lab Results: Recent Labs    04/25/22 0341 04/25/22 1045 04/26/22 0304 04/26/22 0844 04/27/22 0000 04/27/22 0259  NA 122*   < > 125*   < > 129* 129*  K 4.2   < > 4.0  --   --  3.7  CL 91*   < > 95*  --   --  98  CO2 19*   < > 21*  --   --  21*  GLUCOSE 112*   < > 111*  --   --  120*  BUN 35*   < > 30*  --   --  26*  CREATININE 0.58   < > 0.39*  --   --  0.44  CALCIUM 8.9   < > 8.4*  --   --  8.5*  MG 2.7*  --  2.6*  --   --   --   PHOS 3.8  --  3.7  --   --   --    < > = values in this interval not displayed.   Recent Labs    04/26/22 0304 04/27/22 0259  AST 364* 464*  ALT 143* 174*  ALKPHOS 98 98  BILITOT 20.2* 20.4*  PROT 5.9* 5.9*  ALBUMIN 2.4* 2.4*   Recent Labs    04/26/22 0304 04/27/22 0259  WBC 8.6 8.2  NEUTROABS 7.4 7.3  HGB 8.4* 8.3*  HCT 24.2* 23.6*  MCV 111.5* 112.9*  PLT 156 154   No results for input(s): "LABPROT", "INR" in the last 72  hours.    Assessment Acute Alcoholic hepatitis Alcohol abuse Elevated LFTs Hyperbilirubinemia Hyponatremia    HGB 8.3(8.4) Platelets 154(156) AST 464(364) ALT 174(143)  Alkphos 98(98) TBili 20.4(20.2, 22.7, 23.5) GFR >60  INR 04/23/2022 1.7   On admission DF: 52.1   UA with moderate bilirubin, no UTI  Chest X-ray: mild bibasilar atelectasis RUS [place]: hepatic steatosis, gallbladder sludge   Patient with significant history of heavy alcohol use 6 weeks ago. Her last drink was 9/15, she has increasing jaundice and fatigue since quitting alcohol. DF of 52.1 and her history are suggestive of alcoholic hepatitis. Other sources of infection have been evaluated with UA and Chest xray and are negative.    Was started on prednisolone 40 mg daily for total of 28 days on 04/23/2022.  T. bili has improved with steroid therapy.    Patient is hyponatremic, last labs Na 129,  on admission Na 122.   No signs of GI bleed or encephalopathy.   Plan: Patient to go home today after she has a bowel movement Plan for follow-up in GI clinic in 2 weeks for repeat CMP, PT, INR Continue prednisolone 40 mg daily for total of 28 days [facility] will sign off, follow-up outpatient.  Arvella Nigh Shandell Giovanni PA-C 04/27/2022, 11:19 AM  Contact #  [phone]

## 2022-04-27 NOTE — Progress Notes (Signed)
Daily Progress Note   Patient Name: Deanna Mcmillan       Date: 04/27/2022 DOB: 1970-11-30  Age: 51 y.o. MRN#: [id number] Attending Physician: Hosie Poisson, MD Primary Care Physician: Pcp, No Admit Date: 04/22/2022  Reason for Consultation/Follow-up: Establishing goals of care  Patient Profile/HPI: 51 y.o. female  with past medical history of significant ETOH use, chronic anemia has been received vitamin B12 shots in the past admitted on 09/24/5972 with alcoholic hepatitis.  Calculated meld-NA is 30, discriminant function is 52.  Has been evaluated by GI and started on prednisone.  Palliative medicine consulted for above.    Subjective: Jennipher is awake and alert this morning. Ammonia is up- but she has not had BM since yesterday. Plans are for d/c after she has BM.  She didn't want to take mirtazapine last night d/t fear of it making her sleepy. I reviewed with her that actually it was not given Wednesday night so it was not the culprit of her drowsiness yesterday- more likely it was her elevated ammonia or hyponatremia.  Encouraged her to discuss with her PCP what would be a good antidepressant for her and wrote down mirtazapine for her. She is eager to seek treatment for her depression and committed to abstaining from alcohol.  Chaplain completed advance directives and they have been scanned into her chart.  We discussed outpatient Palliative services and she would like referral.   Physical Exam Vitals and nursing note reviewed.  Eyes:     General: Scleral icterus present.  Skin:    Coloration: Skin is jaundiced.  Neurological:     Mental Status: She is alert and oriented to person, place, and time.  Psychiatric:        Mood and Affect: Mood normal.        Behavior: Behavior normal.         Thought Content: Thought content normal.        Judgment: Judgment normal.             Vital Signs: BP (!) 114/44   Pulse 73   Temp 98 F (36.7 C) (Oral)   Resp 10   Ht 5\' 4"  (1.626 m)   Wt 98.4 kg   SpO2 93%   BMI 37.24 kg/m  SpO2: SpO2: 93 % O2 Device: O2 Device: Room  Air O2 Flow Rate:    Intake/output summary:  Intake/Output Summary (Last 24 hours) at 04/27/2022 1023 Last data filed at 04/27/2022 0600 Gross per 24 hour  Intake 319.83 ml  Output 550 ml  Net -230.17 ml    LBM: Last BM Date : 04/26/22 Baseline Weight: Weight: 99.8 kg Most recent weight: Weight: 98.4 kg       Palliative Assessment/Data: PPS: 70%      Patient Active Problem List   Diagnosis Date Noted   Serum total bilirubin elevated 04/24/2022   Alcohol induced acute pancreatitis 80/16/5537   Alcoholic liver disease, unspecified ([place]) 04/23/2022   Hypoalbuminemia 04/23/2022   Hyponatremia 04/23/2022   Alcohol abuse 04/23/2022   QT prolongation 04/23/2022   Hypokalemia 04/23/2022   Appendicitis, acute s/p laparoscopic appendectomy 04/11/13 04/12/2013    Palliative Care Assessment & Plan    Assessment/Recommendations/Plan  Plan for d/c home today after BM TOC order for outpatient Palliative referral Will d/c mirtazapine and encouraged her to discuss treatment for depression with her PCP ASAP  Code Status: Full code  Prognosis:  Unable to determine  Discharge Planning: To Be Determined  Care plan was discussed with patient and care team.  Thank you for allowing the Palliative Medicine Team to assist in the care of this patient.  Greater than 50%  of this time was spent counseling and coordinating care related to the above assessment and plan.  Mariana Kaufman, AGNP-C Palliative Medicine   Please contact Palliative Medicine Team phone at [phone] for questions and concerns.

## 2022-04-28 ENCOUNTER — Encounter: Payer: Self-pay | Admitting: Internal Medicine

## 2022-04-30 ENCOUNTER — Encounter (HOSPITAL_COMMUNITY): Payer: Self-pay

## 2022-05-10 ENCOUNTER — Encounter (HOSPITAL_COMMUNITY): Payer: Self-pay

## 2022-05-10 ENCOUNTER — Emergency Department (HOSPITAL_COMMUNITY): Payer: 59

## 2022-05-10 ENCOUNTER — Observation Stay (HOSPITAL_COMMUNITY)
Admission: EM | Admit: 2022-05-10 | Discharge: 2022-05-12 | Disposition: A | Discharge status: Home / Self Care | Payer: 59 | Attending: Internal Medicine | Admitting: Internal Medicine

## 2022-05-10 DIAGNOSIS — R7989 Other specified abnormal findings of blood chemistry: Secondary | ICD-10-CM

## 2022-05-10 DIAGNOSIS — K760 Fatty (change of) liver, not elsewhere classified: Secondary | ICD-10-CM | POA: Diagnosis present

## 2022-05-10 DIAGNOSIS — D689 Coagulation defect, unspecified: Secondary | ICD-10-CM | POA: Diagnosis not present

## 2022-05-10 DIAGNOSIS — R609 Edema, unspecified: Principal | ICD-10-CM

## 2022-05-10 DIAGNOSIS — R770 Abnormality of albumin: Secondary | ICD-10-CM | POA: Diagnosis not present

## 2022-05-10 DIAGNOSIS — D696 Thrombocytopenia, unspecified: Secondary | ICD-10-CM

## 2022-05-10 DIAGNOSIS — E871 Hypo-osmolality and hyponatremia: Secondary | ICD-10-CM | POA: Diagnosis present

## 2022-05-10 DIAGNOSIS — M79605 Pain in left leg: Secondary | ICD-10-CM | POA: Diagnosis present

## 2022-05-10 DIAGNOSIS — E876 Hypokalemia: Secondary | ICD-10-CM | POA: Diagnosis not present

## 2022-05-10 DIAGNOSIS — K7010 Alcoholic hepatitis without ascites: Secondary | ICD-10-CM | POA: Diagnosis not present

## 2022-05-10 DIAGNOSIS — E8809 Other disorders of plasma-protein metabolism, not elsewhere classified: Secondary | ICD-10-CM | POA: Diagnosis present

## 2022-05-10 DIAGNOSIS — Z6838 Body mass index (BMI) 38.0-38.9, adult: Secondary | ICD-10-CM

## 2022-05-10 DIAGNOSIS — E669 Obesity, unspecified: Secondary | ICD-10-CM | POA: Diagnosis present

## 2022-05-10 DIAGNOSIS — R739 Hyperglycemia, unspecified: Secondary | ICD-10-CM | POA: Insufficient documentation

## 2022-05-10 DIAGNOSIS — R600 Localized edema: Secondary | ICD-10-CM | POA: Diagnosis not present

## 2022-05-10 DIAGNOSIS — Z79899 Other long term (current) drug therapy: Secondary | ICD-10-CM | POA: Insufficient documentation

## 2022-05-10 DIAGNOSIS — D649 Anemia, unspecified: Secondary | ICD-10-CM | POA: Diagnosis not present

## 2022-05-10 DIAGNOSIS — E872 Acidosis, unspecified: Secondary | ICD-10-CM | POA: Diagnosis present

## 2022-05-10 DIAGNOSIS — D539 Nutritional anemia, unspecified: Secondary | ICD-10-CM | POA: Diagnosis present

## 2022-05-10 DIAGNOSIS — R17 Unspecified jaundice: Secondary | ICD-10-CM | POA: Diagnosis present

## 2022-05-10 DIAGNOSIS — D6959 Other secondary thrombocytopenia: Secondary | ICD-10-CM | POA: Diagnosis present

## 2022-05-10 DIAGNOSIS — R601 Generalized edema: Secondary | ICD-10-CM

## 2022-05-10 DIAGNOSIS — Z7952 Long term (current) use of systemic steroids: Secondary | ICD-10-CM

## 2022-05-10 DIAGNOSIS — E8770 Fluid overload, unspecified: Secondary | ICD-10-CM | POA: Diagnosis present

## 2022-05-10 DIAGNOSIS — K7011 Alcoholic hepatitis with ascites: Secondary | ICD-10-CM | POA: Diagnosis present

## 2022-05-10 LAB — COMPREHENSIVE METABOLIC PANEL
ALT: 138 U/L — ABNORMAL HIGH (ref 0–44)
AST: 215 U/L — ABNORMAL HIGH (ref 15–41)
Albumin: 2.9 g/dL — ABNORMAL LOW (ref 3.5–5.0)
Alkaline Phosphatase: 103 U/L (ref 38–126)
Anion gap: 10 (ref 5–15)
BUN: 18 mg/dL (ref 6–20)
CO2: 20 mmol/L — ABNORMAL LOW (ref 22–32)
Calcium: 8.7 mg/dL — ABNORMAL LOW (ref 8.9–10.3)
Chloride: 110 mmol/L (ref 98–111)
Creatinine, Ser: 0.65 mg/dL (ref 0.44–1.00)
GFR, Estimated: >60 mL/min (ref >60)
Glucose, Bld: 136 mg/dL — ABNORMAL HIGH (ref 70–99)
Potassium: 2.8 mmol/L — ABNORMAL LOW (ref 3.5–5.1)
Sodium: 140 mmol/L (ref 135–145)
Total Bilirubin: 11.8 mg/dL — ABNORMAL HIGH (ref 0.3–1.2)
Total Protein: 6.5 g/dL (ref 6.5–8.1)

## 2022-05-10 LAB — CBC
HCT: 27.5 % — ABNORMAL LOW (ref 36.0–46.0)
Hemoglobin: 9.5 g/dL — ABNORMAL LOW (ref 12.0–15.0)
MCH: 38.6 pg — ABNORMAL HIGH (ref 26.0–34.0)
MCHC: 34.5 g/dL (ref 30.0–36.0)
MCV: 111.8 fL — ABNORMAL HIGH (ref 80.0–100.0)
Platelets: 147 10*3/uL — ABNORMAL LOW (ref 150–400)
RBC: 2.46 MIL/uL — ABNORMAL LOW (ref 3.87–5.11)
RDW: 16.4 % — ABNORMAL HIGH (ref 11.5–15.5)
WBC: 11.2 10*3/uL — ABNORMAL HIGH (ref 4.0–10.5)
nRBC: 0 % (ref 0.0–0.2)

## 2022-05-10 LAB — BRAIN NATRIURETIC PEPTIDE: B Natriuretic Peptide: 155.8 pg/mL — ABNORMAL HIGH (ref 0.0–100.0)

## 2022-05-10 MED ORDER — FUROSEMIDE 10 MG/ML IJ SOLN
60.0000 mg | Freq: Once | INTRAMUSCULAR | Status: AC
  Administered 2022-05-11: 60 mg via INTRAVENOUS
  Filled 2022-05-10: qty 8

## 2022-05-10 NOTE — ED Provider Triage Note (Signed)
Emergency Medicine Provider Triage Evaluation Note  Deanna Mcmillan , a 51 y.o. female  was evaluated in triage.  Pt complains of leg swelling.  Patient reports her legs been swollen ever since being discharged in the hospital for acute liver failure.  Patient reports that ever since then she has been given "a clean bill of health".  Patient reports going to PCP today due to enlarged legs and being redirected to the ER.  The patient has 3+ bilateral pitting edema on examination.  The patient denies any orthopnea, chest pain, shortness of breath, fevers.  Patient denies any nausea or vomiting.  Patient also has excessive bruising.  Review of Systems  Positive:  Negative:   Physical Exam  BP (!) 121/100 (BP Location: Left Arm)   Pulse 80   Temp 98.1 F (36.7 C) (Oral)   Resp 16   Ht 5\' 4"  (1.626 m)   Wt 99.8 kg   SpO2 99%   BMI 37.76 kg/m  Gen:   Awake, no distress   Resp:  Normal effort  MSK:   Moves extremities without difficulty  Other:  3+ bilateral pitting edema  Medical Decision Making  Medically screening exam initiated at 2:41 PM.  Appropriate orders placed.  Deanna Mcmillan was informed that the remainder of the evaluation will be completed by another provider, this initial triage assessment does not replace that evaluation, and the importance of remaining in the ED until their evaluation is complete.     Azucena Cecil, PA-C 05/10/22 1442

## 2022-05-10 NOTE — ED Triage Notes (Signed)
Pt arrived via POV, c/o worsening leg swelling that has progressed into fluid leaking from bilateral legs.

## 2022-05-11 ENCOUNTER — Encounter (HOSPITAL_COMMUNITY): Payer: Self-pay | Admitting: Internal Medicine

## 2022-05-11 ENCOUNTER — Observation Stay (HOSPITAL_COMMUNITY): Payer: 59

## 2022-05-11 DIAGNOSIS — Z79899 Other long term (current) drug therapy: Secondary | ICD-10-CM | POA: Diagnosis not present

## 2022-05-11 DIAGNOSIS — E871 Hypo-osmolality and hyponatremia: Secondary | ICD-10-CM | POA: Diagnosis present

## 2022-05-11 DIAGNOSIS — K7010 Alcoholic hepatitis without ascites: Secondary | ICD-10-CM

## 2022-05-11 DIAGNOSIS — R600 Localized edema: Secondary | ICD-10-CM | POA: Diagnosis not present

## 2022-05-11 DIAGNOSIS — K760 Fatty (change of) liver, not elsewhere classified: Secondary | ICD-10-CM | POA: Diagnosis present

## 2022-05-11 DIAGNOSIS — E669 Obesity, unspecified: Secondary | ICD-10-CM | POA: Diagnosis present

## 2022-05-11 DIAGNOSIS — D696 Thrombocytopenia, unspecified: Secondary | ICD-10-CM

## 2022-05-11 DIAGNOSIS — Z6838 Body mass index (BMI) 38.0-38.9, adult: Secondary | ICD-10-CM | POA: Diagnosis not present

## 2022-05-11 DIAGNOSIS — Z7952 Long term (current) use of systemic steroids: Secondary | ICD-10-CM | POA: Diagnosis not present

## 2022-05-11 DIAGNOSIS — D689 Coagulation defect, unspecified: Secondary | ICD-10-CM | POA: Diagnosis present

## 2022-05-11 DIAGNOSIS — E876 Hypokalemia: Secondary | ICD-10-CM | POA: Diagnosis present

## 2022-05-11 DIAGNOSIS — D539 Nutritional anemia, unspecified: Secondary | ICD-10-CM | POA: Diagnosis present

## 2022-05-11 DIAGNOSIS — E8770 Fluid overload, unspecified: Secondary | ICD-10-CM | POA: Diagnosis present

## 2022-05-11 DIAGNOSIS — E872 Acidosis, unspecified: Secondary | ICD-10-CM | POA: Diagnosis present

## 2022-05-11 DIAGNOSIS — K7011 Alcoholic hepatitis with ascites: Secondary | ICD-10-CM | POA: Diagnosis present

## 2022-05-11 DIAGNOSIS — E8809 Other disorders of plasma-protein metabolism, not elsewhere classified: Secondary | ICD-10-CM | POA: Diagnosis present

## 2022-05-11 DIAGNOSIS — D6959 Other secondary thrombocytopenia: Secondary | ICD-10-CM | POA: Diagnosis present

## 2022-05-11 DIAGNOSIS — R739 Hyperglycemia, unspecified: Secondary | ICD-10-CM | POA: Diagnosis present

## 2022-05-11 LAB — CBC WITH DIFFERENTIAL/PLATELET
Abs Immature Granulocytes: 0.06 10*3/uL (ref 0.00–0.07)
Basophils Absolute: 0 10*3/uL (ref 0.0–0.1)
Basophils Relative: 0 %
Eosinophils Absolute: 0.1 10*3/uL (ref 0.0–0.5)
Eosinophils Relative: 1 %
HCT: 26 % — ABNORMAL LOW (ref 36.0–46.0)
Hemoglobin: 8.7 g/dL — ABNORMAL LOW (ref 12.0–15.0)
Immature Granulocytes: 1 %
Lymphocytes Relative: 9 %
Lymphs Abs: 0.9 10*3/uL (ref 0.7–4.0)
MCH: 38.2 pg — ABNORMAL HIGH (ref 26.0–34.0)
MCHC: 33.5 g/dL (ref 30.0–36.0)
MCV: 114 fL — ABNORMAL HIGH (ref 80.0–100.0)
Monocytes Absolute: 0.5 10*3/uL (ref 0.1–1.0)
Monocytes Relative: 5 %
Neutro Abs: 8.6 10*3/uL — ABNORMAL HIGH (ref 1.7–7.7)
Neutrophils Relative %: 84 %
Platelets: 126 10*3/uL — ABNORMAL LOW (ref 150–400)
RBC: 2.28 MIL/uL — ABNORMAL LOW (ref 3.87–5.11)
RDW: 16.3 % — ABNORMAL HIGH (ref 11.5–15.5)
WBC: 10.2 10*3/uL (ref 4.0–10.5)
nRBC: 0 % (ref 0.0–0.2)

## 2022-05-11 LAB — COMPREHENSIVE METABOLIC PANEL
ALT: 131 U/L — ABNORMAL HIGH (ref 0–44)
AST: 196 U/L — ABNORMAL HIGH (ref 15–41)
Albumin: 3 g/dL — ABNORMAL LOW (ref 3.5–5.0)
Alkaline Phosphatase: 88 U/L (ref 38–126)
Anion gap: 8 (ref 5–15)
BUN: 17 mg/dL (ref 6–20)
CO2: 23 mmol/L (ref 22–32)
Calcium: 8.5 mg/dL — ABNORMAL LOW (ref 8.9–10.3)
Chloride: 111 mmol/L (ref 98–111)
Creatinine, Ser: 0.59 mg/dL (ref 0.44–1.00)
GFR, Estimated: >60 mL/min (ref >60)
Glucose, Bld: 109 mg/dL — ABNORMAL HIGH (ref 70–99)
Potassium: 3.1 mmol/L — ABNORMAL LOW (ref 3.5–5.1)
Sodium: 142 mmol/L (ref 135–145)
Total Bilirubin: 12.2 mg/dL — ABNORMAL HIGH (ref 0.3–1.2)
Total Protein: 6.2 g/dL — ABNORMAL LOW (ref 6.5–8.1)

## 2022-05-11 LAB — PROTIME-INR
INR: 1.5 — ABNORMAL HIGH (ref 0.8–1.2)
Prothrombin Time: 18.3 seconds — ABNORMAL HIGH (ref 11.4–15.2)

## 2022-05-11 LAB — MAGNESIUM
Magnesium: 2.1 mg/dL (ref 1.7–2.4)
Magnesium: 2.2 mg/dL (ref 1.7–2.4)

## 2022-05-11 LAB — PHOSPHORUS: Phosphorus: 3.2 mg/dL (ref 2.5–4.6)

## 2022-05-11 MED ORDER — FOLIC ACID 1 MG PO TABS
1.0000 mg | ORAL_TABLET | Freq: Every day | ORAL | Status: DC
  Administered 2022-05-11 – 2022-05-12 (×2): 1 mg via ORAL
  Filled 2022-05-11 (×2): qty 1

## 2022-05-11 MED ORDER — MAGNESIUM OXIDE -MG SUPPLEMENT 400 (240 MG) MG PO TABS
800.0000 mg | ORAL_TABLET | Freq: Once | ORAL | Status: AC
  Administered 2022-05-11: 800 mg via ORAL
  Filled 2022-05-11: qty 2

## 2022-05-11 MED ORDER — POTASSIUM CHLORIDE CRYS ER 20 MEQ PO TBCR
40.0000 meq | EXTENDED_RELEASE_TABLET | Freq: Three times a day (TID) | ORAL | Status: AC
  Administered 2022-05-11 (×3): 40 meq via ORAL
  Filled 2022-05-11 (×3): qty 2

## 2022-05-11 MED ORDER — ENOXAPARIN SODIUM 40 MG/0.4ML IJ SOSY: 40.0000 mg | PREFILLED_SYRINGE | INTRAMUSCULAR | Status: DC

## 2022-05-11 MED ORDER — ALBUMIN HUMAN 25 % IV SOLN
12.5000 g | Freq: Once | INTRAVENOUS | Status: AC
  Administered 2022-05-11: 12.5 g via INTRAVENOUS
  Filled 2022-05-11: qty 50

## 2022-05-11 MED ORDER — SIMETHICONE 80 MG PO CHEW: 80.0000 mg | CHEWABLE_TABLET | Freq: Four times a day (QID) | ORAL | Status: DC | PRN

## 2022-05-11 MED ORDER — PROCHLORPERAZINE EDISYLATE 10 MG/2ML IJ SOLN: 10.0000 mg | Freq: Four times a day (QID) | INTRAMUSCULAR | Status: DC | PRN

## 2022-05-11 MED ORDER — OXYCODONE HCL 5 MG PO TABS
5.0000 mg | ORAL_TABLET | Freq: Four times a day (QID) | ORAL | Status: DC | PRN
  Administered 2022-05-11: 5 mg via ORAL
  Filled 2022-05-11: qty 1

## 2022-05-11 MED ORDER — THIAMINE MONONITRATE 100 MG PO TABS
100.0000 mg | ORAL_TABLET | Freq: Every day | ORAL | Status: DC
  Administered 2022-05-11 – 2022-05-12 (×2): 100 mg via ORAL
  Filled 2022-05-11 (×2): qty 1

## 2022-05-11 MED ORDER — ADULT MULTIVITAMIN W/MINERALS CH
1.0000 | ORAL_TABLET | Freq: Every day | ORAL | Status: DC
  Administered 2022-05-11 – 2022-05-12 (×2): 1 via ORAL
  Filled 2022-05-11 (×2): qty 1

## 2022-05-11 MED ORDER — MORPHINE SULFATE (PF) 4 MG/ML IV SOLN
4.0000 mg | Freq: Once | INTRAVENOUS | Status: AC
  Administered 2022-05-11: 4 mg via INTRAVENOUS
  Filled 2022-05-11: qty 1

## 2022-05-11 MED ORDER — POTASSIUM CHLORIDE 10 MEQ/100ML IV SOLN
10.0000 meq | Freq: Once | INTRAVENOUS | Status: AC
  Administered 2022-05-11: 10 meq via INTRAVENOUS
  Filled 2022-05-11: qty 100

## 2022-05-11 MED ORDER — PANTOPRAZOLE SODIUM 40 MG PO TBEC
40.0000 mg | DELAYED_RELEASE_TABLET | Freq: Every day | ORAL | Status: DC
  Administered 2022-05-11 – 2022-05-12 (×2): 40 mg via ORAL
  Filled 2022-05-11 (×2): qty 1

## 2022-05-11 MED ORDER — MIDODRINE HCL 5 MG PO TABS
10.0000 mg | ORAL_TABLET | Freq: Three times a day (TID) | ORAL | Status: AC
  Administered 2022-05-11 (×3): 10 mg via ORAL
  Filled 2022-05-11 (×3): qty 2

## 2022-05-11 MED ORDER — PREDNISOLONE 5 MG PO TABS
40.0000 mg | ORAL_TABLET | Freq: Every day | ORAL | Status: DC
  Administered 2022-05-11 – 2022-05-12 (×2): 40 mg via ORAL
  Filled 2022-05-11 (×2): qty 8

## 2022-05-11 MED ORDER — FUROSEMIDE 10 MG/ML IJ SOLN
60.0000 mg | Freq: Two times a day (BID) | INTRAMUSCULAR | Status: DC
  Administered 2022-05-11 – 2022-05-12 (×2): 60 mg via INTRAVENOUS
  Filled 2022-05-11: qty 6
  Filled 2022-05-11: qty 8

## 2022-05-11 MED ORDER — LACTULOSE 10 GM/15ML PO SOLN
20.0000 g | Freq: Three times a day (TID) | ORAL | Status: DC
  Administered 2022-05-11 – 2022-05-12 (×4): 20 g via ORAL
  Filled 2022-05-11 (×4): qty 30

## 2022-05-11 MED ORDER — POLYETHYLENE GLYCOL 3350 17 G PO PACK: 17.0000 g | PACK | Freq: Every day | ORAL | Status: DC | PRN

## 2022-05-11 MED ORDER — SPIRONOLACTONE 25 MG PO TABS
50.0000 mg | ORAL_TABLET | Freq: Every day | ORAL | Status: DC
  Administered 2022-05-11 – 2022-05-12 (×2): 50 mg via ORAL
  Filled 2022-05-11 (×4): qty 2

## 2022-05-11 MED ORDER — MELATONIN 5 MG PO TABS: 5.0000 mg | ORAL_TABLET | Freq: Every evening | ORAL | Status: DC | PRN

## 2022-05-11 MED ORDER — ONDANSETRON HCL 4 MG/2ML IJ SOLN
4.0000 mg | Freq: Once | INTRAMUSCULAR | Status: AC
  Administered 2022-05-11: 4 mg via INTRAVENOUS
  Filled 2022-05-11: qty 2

## 2022-05-11 MED ORDER — POTASSIUM CHLORIDE CRYS ER 20 MEQ PO TBCR
40.0000 meq | EXTENDED_RELEASE_TABLET | Freq: Once | ORAL | Status: AC
  Administered 2022-05-11: 40 meq via ORAL
  Filled 2022-05-11: qty 2

## 2022-05-11 NOTE — ED Notes (Signed)
Patient requesting pain medicine Deanna Cordia, DO messaged via secure chat

## 2022-05-11 NOTE — Consult Note (Signed)
Referring Provider: [facility] Primary Care Physician:  Mariel Sleet Primary Gastroenterologist:  Althia Forts  Reason for Consultation:  alcoholic hepatitis  HPI: Deanna Mcmillan is a 51 y.o. female with past medical history of alcohol abuse, previously admitted to [facility] hospital [id number]/2-56/09/8935 for alcoholic hepatitis.   Previously presented to the hospital x1 for evaluation of jaundice and fatigue.  She admitted to recently increasing in alcohol use to 1.5 L of wine daily for several weeks.  She had decided to stop drinking alcohol several weeks prior to admission.  Last drink at 9/15.  Patient had T. bili of 27.9, AST 43, ALT 89, alk phos 85, Maddrey's discriminant factor 52.1.  No infection identified, patient was started on prednisolone 40 mg once daily for total of 28 days.  She was also started on lactulose for constipation.  Hospitalization was complicated by significant hyponatremia she was transferred to the ICU.  She is known to have chronic hyponatremia.  Electrolytes normalized, bilirubin began to improve was 20.4 at discharge. T. Bili rechecked outpatient at 16.   After discharge patient notes she has had continuing worsening of bilateral lower extremity edema.  Notes she continues to take her prednisolone 40 mg daily and lactulose at home.  Taking furosemide 20 mg twice daily.  She tried increasing her furosemide with no improvement of swelling.  After her shower she noted her legs were weeping and beginning to have breaks in her skin she presented to her primary care doctor who recommended presented to the emergency room for further evaluation.  In the ED, T bilirubin improved to 12.2, AST/ALT 196/131, protein decreased at 6.2, albumin decreased at 3.0, PT 18.3, INR 1.5.  WBC 11.2, Hgb 9.5, potassium 2.8. ultrasound abdomen limited for ascites notable for small volume ascites in bilateral lower quadrants.  Chest x-ray with small bilateral pleural effusions.  Was started on potassium  supplement, given a bolus dose of Lasix and admitted for IV diuresis. GI consulted.    History reviewed. No pertinent past medical history.  Past Surgical History:  Procedure Laterality Date   KNEE ARTHROSCOPY W/ ACL RECONSTRUCTION Left 1994/5   Dr. Weston Anna   LAPAROSCOPIC APPENDECTOMY N/A 04/11/2013   Procedure: APPENDECTOMY LAPAROSCOPIC;  Surgeon: Stark Klein, MD;  Location: [facility];  Service: General;  Laterality: N/A;   [facility]   3 extractions    Prior to Admission medications   Medication Sig Start Date End Date Taking? Authorizing Provider  acetaminophen (TYLENOL) 500 MG tablet Take 500 mg by mouth every 6 (six) hours as needed for headache or moderate pain.   Yes [provider]  folic acid (FOLVITE) 1 MG tablet Take 1 tablet (1 mg total) by mouth daily. 04/28/22  Yes Hosie Poisson, MD  furosemide (LASIX) 20 MG tablet Take 1 tablet (20 mg total) by mouth 2 (two) times daily. 04/27/22 04/27/23 Yes Hosie Poisson, MD  lactulose (CHRONULAC) 10 GM/15ML solution Take 30 mLs (20 g total) by mouth 3 (three) times daily. 04/27/22  Yes Hosie Poisson, MD  Multiple Vitamin (MULTIVITAMIN WITH MINERALS) TABS tablet Take 1 tablet by mouth daily. 04/28/22  Yes Hosie Poisson, MD  pantoprazole (PROTONIX) 40 MG tablet Take 1 tablet (40 mg total) by mouth daily. 04/27/22 04/27/23 Yes Hosie Poisson, MD  predniSONE (DELTASONE) 20 MG tablet Take 40 mg by mouth daily. 04/29/22  Yes [provider]  thiamine (VITAMIN B-1) 100 MG tablet Take 1 tablet (100 mg total) by mouth daily. 04/28/22  Yes  Hosie Poisson, MD  prednisoLONE 5 MG TABS tablet Take 8 tablets (40 mg total) by mouth daily for 23 days. Patient not taking: Reported on 05/11/2022 04/28/22 05/21/22  Hosie Poisson, MD  simethicone (MYLICON) 80 MG chewable tablet Chew 1 tablet (80 mg total) by mouth every 6 (six) hours as needed for flatulence. Patient not taking: Reported on 05/11/2022 04/27/22   Hosie Poisson, MD     Scheduled Meds:  folic acid  1 mg Oral Daily   lactulose  20 g Oral TID   midodrine  10 mg Oral TID WC   multivitamin with minerals  1 tablet Oral Daily   pantoprazole  40 mg Oral Daily   potassium chloride SA  40 mEq Oral TID   prednisoLONE  40 mg Oral Daily   thiamine  100 mg Oral Daily   Continuous Infusions: PRN Meds:.melatonin, polyethylene glycol, prochlorperazine, simethicone  Allergies as of 05/10/2022   (No Known Allergies)    History reviewed. No pertinent family history.  Social History   Socioeconomic History   Marital status: Divorced    Spouse name: Not on file   Number of children: Not on file   Years of education: Not on file   Highest education level: Not on file  Occupational History   Not on file  Tobacco Use   Smoking status: Never   Smokeless tobacco: Not on file  Substance and Sexual Activity   Alcohol use: Yes    Comment: daily one glass of wine   Drug use: No   Sexual activity: Not on file  Other Topics Concern   Not on file  Social History Narrative   Not on file   Social Determinants of Health   Financial Resource Strain: Not on file  Food Insecurity: Not on file  Transportation Needs: Not on file  Physical Activity: Not on file  Stress: Not on file  Social Connections: Not on file  Intimate Partner Violence: Not on file    Review of Systems: All negative except as stated above in HPI.  Physical Exam:Physical Exam Constitutional:      General: She is not in acute distress.    Appearance: Normal appearance. She is obese.  HENT:     Head: Normocephalic and atraumatic.     Right Ear: External ear normal.     Left Ear: External ear normal.     Nose: Nose normal.     Mouth/Throat:     Mouth: Mucous membranes are moist.  Eyes:     General: Scleral icterus present.     Pupils: Pupils are equal, round, and reactive to light.  Cardiovascular:     Rate and Rhythm: Normal rate and regular rhythm.     Pulses: Normal pulses.      Heart sounds: Normal heart sounds.  Pulmonary:     Effort: Pulmonary effort is normal.     Breath sounds: Normal breath sounds.  Abdominal:     General: Bowel sounds are normal. There is distension (mildly).     Palpations: Abdomen is soft. There is no mass.     Tenderness: There is no abdominal tenderness. There is no guarding or rebound.     Hernia: No hernia is present.  Musculoskeletal:        General: Normal range of motion.     Cervical back: Normal range of motion and neck supple.     Right lower leg: Edema (pitting to the mid thigh) present.     Left lower  leg: Edema (pitting to the mid thigh) present.  Skin:    General: Skin is warm and dry.     Coloration: Skin is jaundiced.  Neurological:     General: No focal deficit present.     Mental Status: She is alert and oriented to person, place, and time. Mental status is at baseline.  Psychiatric:        Mood and Affect: Mood normal.        Behavior: Behavior normal.     Vital signs: Vitals:   05/11/22 1000 05/11/22 1030  BP: 97/62 (!) 100/50  Pulse: 74 78  Resp: 14 15  Temp:  98 F (36.7 C)  SpO2: 100% 97%        GI:  Lab Results: Recent Labs    05/10/22 1502 05/11/22 0542  WBC 11.2* 10.2  HGB 9.5* 8.7*  HCT 27.5* 26.0*  PLT 147* 126*   BMET Recent Labs    05/10/22 1502 05/11/22 0542  NA 140 142  K 2.8* 3.1*  CL 110 111  CO2 20* 23  GLUCOSE 136* 109*  BUN 18 17  CREATININE 0.65 0.59  CALCIUM 8.7* 8.5*   LFT Recent Labs    05/11/22 0542  PROT 6.2*  ALBUMIN 3.0*  AST 196*  ALT 131*  ALKPHOS 88  BILITOT 12.2*   PT/INR Recent Labs    05/11/22 0542  LABPROT 18.3*  INR 1.5*     Studies/Results: DG Chest 2 View  Result Date: 05/10/2022 CLINICAL DATA:  Pleural effusion EXAM: CHEST - 2 VIEW COMPARISON:  04/22/2022 FINDINGS: Cardiac size is within normal limits. There are no signs of pulmonary edema. There are transverse linear densities in both lower lung fields. There is minimal  blunting of both lateral CP angles. There is interval decrease in amount of bilateral pleural effusions. There is no pneumothorax. IMPRESSION: Small bilateral pleural effusions, more so on the left side with interval decrease. Linear densities in the lower lung fields suggest subsegmental atelectasis or scarring. Electronically Signed   By: Elmer Picker M.D.   On: 05/10/2022 16:47    Impression: Alcoholic hepatitis -improving T. Bili History of alcohol abuse Anasarca Small volume ascites  HGB 8.7 Platelets 126 AST 196 ALT 131  Alkphos 88 TBili 12.2 GFR >60  INR 05/11/2022 1.5   Patient with history of alcoholic hepatitis improving with prednisone 40 mg she was started on prednisolone 40 mg 04/25/2022 will need to continue for 28 days.  She has worsening anasarca and small volume ascites on ultrasound.  She was on furosemide 40 mg daily at home.  Anasarca improving with bolus frusemide 60 mg in the emergency department.  Plan: Can start furosemide 40 mg daily Start spironolactone 50 mg daily Continue prednisolone 40 mg once daily for total of 28 days, started 95/6 Continue folic acid, multivitamin, thiamine Continue MiraLAX 17 g daily as needed Continue lactulose 20 g 3 times daily  [facility] will follow  LOS: 0 days   Charlott Rakes  PA-C 05/11/2022, 11:32 AM  Contact #  [phone]

## 2022-05-11 NOTE — ED Provider Notes (Signed)
[facility] DEPT Provider Note   CSN: [id number] Arrival date & time: 05/10/22  1412     History  Chief Complaint  Patient presents with   Leg Swelling    Deanna Mcmillan is a 51 y.o. female.  51 yo F with a chief complaints of bilateral leg swelling.  This has been going on since she was recently hospitalized and is gotten progressively worse.  She has tried escalating doses of Lasix and leg elevation but has had no improvement.  Got to the point where her legs are very painful and she is started to have weeping and is developing almost ulcerations to bilateral lower extremities.  Saw her family doctor today who encouraged her to come here to be placed into the hospital for IV diuresis.        Home Medications Prior to Admission medications   Medication Sig Start Date End Date Taking? Authorizing Provider  acetaminophen (TYLENOL) 500 MG tablet Take 500 mg by mouth every 6 (six) hours as needed for headache or moderate pain.   Yes [provider]  folic acid (FOLVITE) 1 MG tablet Take 1 tablet (1 mg total) by mouth daily. 04/28/22  Yes Hosie Poisson, MD  furosemide (LASIX) 20 MG tablet Take 1 tablet (20 mg total) by mouth 2 (two) times daily. 04/27/22 04/27/23 Yes Hosie Poisson, MD  lactulose (CHRONULAC) 10 GM/15ML solution Take 30 mLs (20 g total) by mouth 3 (three) times daily. 04/27/22  Yes Hosie Poisson, MD  Multiple Vitamin (MULTIVITAMIN WITH MINERALS) TABS tablet Take 1 tablet by mouth daily. 04/28/22  Yes Hosie Poisson, MD  pantoprazole (PROTONIX) 40 MG tablet Take 1 tablet (40 mg total) by mouth daily. 04/27/22 04/27/23 Yes Hosie Poisson, MD  predniSONE (DELTASONE) 20 MG tablet Take 40 mg by mouth daily. 04/29/22  Yes [provider]  thiamine (VITAMIN B-1) 100 MG tablet Take 1 tablet (100 mg total) by mouth daily. 04/28/22  Yes Hosie Poisson, MD  prednisoLONE 5 MG TABS tablet Take 8 tablets (40 mg total) by mouth daily for 23  days. Patient not taking: Reported on 05/11/2022 04/28/22 05/21/22  Hosie Poisson, MD  simethicone (MYLICON) 80 MG chewable tablet Chew 1 tablet (80 mg total) by mouth every 6 (six) hours as needed for flatulence. Patient not taking: Reported on 05/11/2022 04/27/22   Hosie Poisson, MD      Allergies    Patient has no known allergies.    Review of Systems   Review of Systems  Physical Exam Updated Vital Signs BP (!) 112/55   Pulse 73   Temp 98.1 F (36.7 C) (Oral)   Resp 18   Ht 5\' 4"  (1.626 m)   Wt 99.8 kg   SpO2 95%   BMI 37.76 kg/m  Physical Exam  ED Results / Procedures / Treatments   Labs (all labs ordered are listed, but only abnormal results are displayed) Labs Reviewed  CBC - Abnormal; Notable for the following components:      Result Value   WBC 11.2 (*)    RBC 2.46 (*)    Hemoglobin 9.5 (*)    HCT 27.5 (*)    MCV 111.8 (*)    MCH 38.6 (*)    RDW 16.4 (*)    Platelets 147 (*)    All other components within normal limits  COMPREHENSIVE METABOLIC PANEL - Abnormal; Notable for the following components:   Potassium 2.8 (*)    CO2 20 (*)  Glucose, Bld 136 (*)    Calcium 8.7 (*)    Albumin 2.9 (*)    AST 215 (*)    ALT 138 (*)    Total Bilirubin 11.8 (*)    All other components within normal limits  BRAIN NATRIURETIC PEPTIDE - Abnormal; Notable for the following components:   B Natriuretic Peptide 155.8 (*)    All other components within normal limits  MAGNESIUM  CBC WITH DIFFERENTIAL/PLATELET  COMPREHENSIVE METABOLIC PANEL  MAGNESIUM  PHOSPHORUS  PROTIME-INR    EKG None  Radiology DG Chest 2 View  Result Date: 05/10/2022 CLINICAL DATA:  Pleural effusion EXAM: CHEST - 2 VIEW COMPARISON:  04/22/2022 FINDINGS: Cardiac size is within normal limits. There are no signs of pulmonary edema. There are transverse linear densities in both lower lung fields. There is minimal blunting of both lateral CP angles. There is interval decrease in amount of  bilateral pleural effusions. There is no pneumothorax. IMPRESSION: Small bilateral pleural effusions, more so on the left side with interval decrease. Linear densities in the lower lung fields suggest subsegmental atelectasis or scarring. Electronically Signed   By: Elmer Picker M.D.   On: 05/10/2022 16:47    Procedures Procedures    Medications Ordered in ED Medications  furosemide (LASIX) injection 60 mg (has no administration in time range)  folic acid (FOLVITE) tablet 1 mg (has no administration in time range)  lactulose (CHRONULAC) 10 GM/15ML solution 20 g (has no administration in time range)  multivitamin with minerals tablet 1 tablet (has no administration in time range)  pantoprazole (PROTONIX) EC tablet 40 mg (has no administration in time range)  prednisoLONE tablet 40 mg (has no administration in time range)  simethicone (MYLICON) chewable tablet 80 mg (has no administration in time range)  thiamine (VITAMIN B1) tablet 100 mg (has no administration in time range)  melatonin tablet 5 mg (has no administration in time range)  polyethylene glycol (MIRALAX / GLYCOLAX) packet 17 g (has no administration in time range)  prochlorperazine (COMPAZINE) injection 10 mg (has no administration in time range)  potassium chloride SA (KLOR-CON M) CR tablet 40 mEq (40 mEq Oral Given 05/11/22 0409)  midodrine (PROAMATINE) tablet 10 mg (10 mg Oral Given 05/11/22 0448)  potassium chloride 10 mEq in 100 mL IVPB (10 mEq Intravenous New Bag/Given 05/11/22 0256)  potassium chloride SA (KLOR-CON M) CR tablet 40 mEq (40 mEq Oral Given 05/11/22 0254)  magnesium oxide (MAG-OX) tablet 800 mg (800 mg Oral Given 05/11/22 0254)  morphine (PF) 4 MG/ML injection 4 mg (4 mg Intravenous Given 05/11/22 0254)  ondansetron (ZOFRAN) injection 4 mg (4 mg Intravenous Given 05/11/22 0254)  albumin human 25 % solution 12.5 g (12.5 g Intravenous New Bag/Given 05/11/22 7510)    ED Course/ Medical Decision Making/  A&P                           Medical Decision Making Amount and/or Complexity of Data Reviewed Labs: ordered.  Risk OTC drugs. Prescription drug management. Decision regarding hospitalization.   51 yo F with a significant past medical history of alcoholic cirrhosis comes in with a chief complaints of bilateral lower extremity edema.  This extends all the way up to the hips.  She does have some superficial ulceration without obvious infection on both legs.  She has some oozing of serous appearing fluid.  She has hypokalemia here, potassium of 2.8.  Could be contributing to her Lasix not  working well at home.  We will supplement her potassium here.  Add a mag level.  We will give a bolus dose of Lasix.  Due to the sheer extent of edema will discuss with medicine for possible admission.   The patients results and plan were reviewed and discussed.   Any x-rays performed were independently reviewed by myself.   Differential diagnosis were considered with the presenting HPI.  Medications  furosemide (LASIX) injection 60 mg (has no administration in time range)  folic acid (FOLVITE) tablet 1 mg (has no administration in time range)  lactulose (CHRONULAC) 10 GM/15ML solution 20 g (has no administration in time range)  multivitamin with minerals tablet 1 tablet (has no administration in time range)  pantoprazole (PROTONIX) EC tablet 40 mg (has no administration in time range)  prednisoLONE tablet 40 mg (has no administration in time range)  simethicone (MYLICON) chewable tablet 80 mg (has no administration in time range)  thiamine (VITAMIN B1) tablet 100 mg (has no administration in time range)  melatonin tablet 5 mg (has no administration in time range)  polyethylene glycol (MIRALAX / GLYCOLAX) packet 17 g (has no administration in time range)  prochlorperazine (COMPAZINE) injection 10 mg (has no administration in time range)  potassium chloride SA (KLOR-CON M) CR tablet 40 mEq (40 mEq Oral  Given 05/11/22 0409)  midodrine (PROAMATINE) tablet 10 mg (10 mg Oral Given 05/11/22 0448)  potassium chloride 10 mEq in 100 mL IVPB (10 mEq Intravenous New Bag/Given 05/11/22 0256)  potassium chloride SA (KLOR-CON M) CR tablet 40 mEq (40 mEq Oral Given 05/11/22 0254)  magnesium oxide (MAG-OX) tablet 800 mg (800 mg Oral Given 05/11/22 0254)  morphine (PF) 4 MG/ML injection 4 mg (4 mg Intravenous Given 05/11/22 0254)  ondansetron (ZOFRAN) injection 4 mg (4 mg Intravenous Given 05/11/22 0254)  albumin human 25 % solution 12.5 g (12.5 g Intravenous New Bag/Given 05/11/22 0452)    Vitals:   05/10/22 1833 05/10/22 2019 05/10/22 2202 05/11/22 0319  BP: 126/60 101/77 (!) 112/55   Pulse: 77 82 73   Resp: 18 17 18    Temp: 98.9 F (37.2 C) 98.5 F (36.9 C) 98.3 F (36.8 C) 98.1 F (36.7 C)  TempSrc: Oral Oral Oral Oral  SpO2: 95% 96% 95%   Weight:      Height:        Final diagnoses:  Peripheral edema    Admission/ observation were discussed with the admitting physician, patient and/or family and they are comfortable with the plan.         Final Clinical Impression(s) / ED Diagnoses Final diagnoses:  Peripheral edema    Rx / DC Orders ED Discharge Orders     None         Deno Etienne, DO 05/11/22 0539

## 2022-05-11 NOTE — Progress Notes (Signed)
Care started prior to midnight in the emergency room patient was admitted early this morning after midnight by Dr. Irene Pap and I am in current agreement with her assessment and plan.  Additional changes to the plan of care been made accordingly.  The patient is a 51 year old obese Caucasian female with a past medical history significant for but not limited to formal alcohol abuse, recent admission for acute liver failure and alcoholic hepatitis as well as other comorbidities who presented to the ED with worsening severe bilateral lower extremity edema as well as abdominal distention and to the point where her legs began weeping.  She noticed worsening abdominal distention and continued jaundice and denies any overt bleeding or melena.  She is tried elevate her legs at home and increase her home dose of Lasix and saw her PCP yesterday who advised her to come to the ED for further evaluation.  He was started on IV diuresis and received an IV Lasix 60 mg x 1 and labs were notable for hypokalemia which was repleted in the ED.  She continues to be volume overloaded and has anasarca in the setting of her hypoalbuminemia.  Echocardiogram was recently done and showed no evidence of CHF.  Currently she is being admitted and treated for the following but not limited to:  Severe bilateral lower extremity edema, suspect secondary to third spacing from hypoalbuminemia and anasarca -IV diuresis started in the ED, continue -Add Midodrine 10 mg TID x 3 doses to avoid hypotension and to allow for diuresis -Elevate lower extremities bilaterally -May benefit from unna boots for severe lower extremity edema and change every 2 weeks?  -Continue with diuresis and she had no evidence of heart failure   Elevated liver chemistries in the setting of hepatic steatosis and alcoholic hepatitis seen on abdominal ultrasound on 04/22/2022 Hyperbilirubinemia -Avoid hepatotoxic agents -Continue home lactulose, regimen -Add IV albumin  12.5 g x1 -LFTs are downtrending from prior admission and then AST is now 196 ALT is now 131 -T. bili elevated and at the time of discharge last time was 20.4 is now trending down to 12.2 -GI consulted for further evaluation   Hypokalemia -Serum potassium 2.8 on admission improved to 3.1 -Continue to replete repleted intravenously and orally. -Mag level was 2.2 -To monitor replete as necessary and repeat CMP and mag in a.m.   Mild non-anion gap metabolic acidosis -Improved.  Patient's CO2 is now 23, anion gap is 8, chloride level is 111 -Continue to monitor and trend and repeat CMP in a.m.   Hyperglycemia -Serum glucose 136 -Hemoglobin A1c 4.2 on 04/25/2022. -Avoid hypoglycemia    Acute alcoholic hepatitis with coagulopathy -Denies overt bleeding or melena -Continue home prednisolone, and Protonix. -Gastroenterology consulted for further assistance   Thrombocytopenia likely secondary to liver disease -Platelet count 147 on admission and is now 126 but prior to admission at the time of discharge is 154 -Continue monitor for signs and symptoms of bleeding; no overt bleeding noted -Repeat CBC in the a.m.  Hypoalbuminemia -Patient's Albumin Level went from 2.4 -> 2.9 -> 3.0 -Continue to Monitor and Trend and Repeat CMP in the AM   Macrocytic Anemia -Patient's Hgb/Hct went from 8.3/23.6 -> 9.5/27.5 -> 8.7/26.0 with an an MCV of 114.0 -Check Anemia Panel in the AM  -Continue to Monitor for S/Sx of Bleeding; No overt Bleeding noted -Repeat CBC in the AM    Obesity -Complicates overall prognosis and care -Estimated body mass index is 37.76 kg/m as calculated from the  following:   Height as of this encounter: 5\' 4"  (1.626 m).   Weight as of this encounter: 99.8 kg.  -Weight Loss and Dietary Counseling given  We will continue to monitor patient's clinical response to intervention and repeat blood work and follow-up on the imaging and GI evaluation

## 2022-05-11 NOTE — H&P (Signed)
History and Physical  Deanna Mcmillan WFU:[id number] DOB: [birth year] DOA: 05/10/2022  Referring physician: Dr. Tyrone Nine, [place]  PCP: Elisabeth Cara, [place]  Outpatient Specialists: OB/GYN. Patient coming from: Home.  Chief Complaint: Legs swelling.   HPI: Deanna Mcmillan is a 51 y.o. female with medical history significant for recent admission for acute liver failure in the setting of alcohol abuse, obesity, former alcohol abuse, who presented with complaints of severe bilateral lower extremity edema that has become worse to the point that her legs are weeping.  Associated with abdominal distention and jaundice.  No overt bleeding or melena.  She tried to increase her home dose of Lasix and did legs elevation without improvement.  She saw her primary care provider today who encouraged her to come to the ED for further evaluation and management.  In the ED, she was started on IV diuresis, IV Lasix 60 mg x 1.  Lab studies notable for hypokalemia with serum potassium of 2.8.  Repleted in the ED.  The patient was admitted by [facility], hospitalist service.  ED Course: Tmax 98.9.  BP 112/55, pulse 73, respiration rate 18, O2 saturation 95% on room air.  Review of Systems: Review of systems as noted in the HPI. All other systems reviewed and are negative.   History reviewed. No pertinent past medical history. Past Surgical History:  Procedure Laterality Date   KNEE ARTHROSCOPY W/ ACL RECONSTRUCTION Left 1994/5   Dr. Weston Anna   LAPAROSCOPIC APPENDECTOMY N/A 04/11/2013   Procedure: APPENDECTOMY LAPAROSCOPIC;  Surgeon: Stark Klein, MD;  Location: [facility];  Service: General;  Laterality: N/A;   [place]   3 extractions    Social History:  reports that she has never smoked. She does not have any smokeless tobacco history on file. She reports current alcohol use. She reports that she does not use drugs.   No Known Allergies  History reviewed. No pertinent family history.     Prior to Admission medications   Medication Sig Start Date End Date Taking? Authorizing Provider  acetaminophen (TYLENOL) 500 MG tablet Take 500 mg by mouth every 6 (six) hours as needed for headache or moderate pain.    [provider]  folic acid (FOLVITE) 1 MG tablet Take 1 tablet (1 mg total) by mouth daily. 04/28/22   Hosie Poisson, MD  furosemide (LASIX) 20 MG tablet Take 1 tablet (20 mg total) by mouth 2 (two) times daily. 04/27/22 04/27/23  Hosie Poisson, MD  lactulose (CHRONULAC) 10 GM/15ML solution Take 30 mLs (20 g total) by mouth 3 (three) times daily. 04/27/22   Hosie Poisson, MD  Multiple Vitamin (MULTIVITAMIN WITH MINERALS) TABS tablet Take 1 tablet by mouth daily. 04/28/22   Hosie Poisson, MD  pantoprazole (PROTONIX) 40 MG tablet Take 1 tablet (40 mg total) by mouth daily. 04/27/22 04/27/23  Hosie Poisson, MD  prednisoLONE 5 MG TABS tablet Take 8 tablets (40 mg total) by mouth daily for 23 days. 04/28/22 05/21/22  Hosie Poisson, MD  predniSONE (DELTASONE) 20 MG tablet Take 40 mg by mouth daily. 04/29/22   [provider]  simethicone (MYLICON) 80 MG chewable tablet Chew 1 tablet (80 mg total) by mouth every 6 (six) hours as needed for flatulence. 04/27/22   Hosie Poisson, MD  thiamine (VITAMIN B-1) 100 MG tablet Take 1 tablet (100 mg total) by mouth daily. 04/28/22   Hosie Poisson, MD    Physical Exam: BP (!) 112/55   Pulse 73   Temp  98.3 F (36.8 C) (Oral)   Resp 18   Ht 5\' 4"  (1.626 m)   Wt 99.8 kg   SpO2 95%   BMI 37.76 kg/m   General: 51 y.o. year-old female well developed well nourished in no acute distress.  Alert and oriented x3. Cardiovascular: Regular rate and rhythm with no rubs or gallops.  No thyromegaly or JVD noted.  4+ pitting edema in lower extremities bilaterally. Respiratory: Clear to auscultation with no wheezes or rales. Good inspiratory effort. Abdomen: Soft nontender distended with normal bowel sounds x4 quadrants. Muskuloskeletal: No  cyanosis or clubbing.  4+ pitting edema in lower extremities bilaterally. Neuro: CN II-XII intact, strength, sensation, reflexes Skin: Blisters involving lower extremities bilaterally. Psychiatry: Judgement and insight appear normal. Mood is appropriate for condition and setting          Labs on Admission:  Basic Metabolic Panel: Recent Labs  Lab 05/10/22 1502  NA 140  K 2.8*  CL 110  CO2 20*  GLUCOSE 136*  BUN 18  CREATININE 0.65  CALCIUM 8.7*   Liver Function Tests: Recent Labs  Lab 05/10/22 1502  AST 215*  ALT 138*  ALKPHOS 103  BILITOT 11.8*  PROT 6.5  ALBUMIN 2.9*   No results for input(s): "LIPASE", "AMYLASE" in the last 168 hours. No results for input(s): "AMMONIA" in the last 168 hours. CBC: Recent Labs  Lab 05/10/22 1502  WBC 11.2*  HGB 9.5*  HCT 27.5*  MCV 111.8*  PLT 147*   Cardiac Enzymes: No results for input(s): "CKTOTAL", "CKMB", "CKMBINDEX", "TROPONINI" in the last 168 hours.  BNP (last 3 results) Recent Labs    05/10/22 1502  BNP 155.8*    ProBNP (last 3 results) No results for input(s): "PROBNP" in the last 8760 hours.  CBG: No results for input(s): "GLUCAP" in the last 168 hours.  Radiological Exams on Admission: DG Chest 2 View  Result Date: 05/10/2022 CLINICAL DATA:  Pleural effusion EXAM: CHEST - 2 VIEW COMPARISON:  04/22/2022 FINDINGS: Cardiac size is within normal limits. There are no signs of pulmonary edema. There are transverse linear densities in both lower lung fields. There is minimal blunting of both lateral CP angles. There is interval decrease in amount of bilateral pleural effusions. There is no pneumothorax. IMPRESSION: Small bilateral pleural effusions, more so on the left side with interval decrease. Linear densities in the lower lung fields suggest subsegmental atelectasis or scarring. Electronically Signed   By: Elmer Picker M.D.   On: 05/10/2022 16:47    EKG: I independently viewed the EKG done and my  findings are as followed: None available at the time of the visit.  Assessment/Plan Present on Admission:  Leg edema  Principal Problem:   Leg edema  Severe bilateral lower extremity edema, suspect secondary to third spacing from hypoalbuminemia IV diuresis started in the ED, continue Add Midodrine 10 mg TID x 3 doses to avoid hypotension and to allow for diuresis Elevate lower extremities bilaterally May benefit from unna boots for severe lower extremity edema and change every 2 weeks?   Elevated liver chemistries in the setting of hepatic steatosis seen on abdominal ultrasound on 04/22/2022 Avoid hepatotoxic agents Continue home lactulose, regimen Add IV albumin 12.5 g x1 LFTs are downtrending  Hypokalemia Serum potassium 2.8 Repleted intravenously and orally. Repeat chemistry panel Obtain magnesium level in the morning  Mild non-anion gap metabolic acidosis Serum bicarb 20, anion gap 10 Monitor chemistry panel.  Hyperglycemia Serum glucose 136 Hemoglobin A1c 4.2 on  04/25/2022. Avoid hypoglycemia  History of acute alcoholic hepatitis with coagulopathy Denies overt bleeding or melena Continue home prednisolone, and Protonix. Consider GI involvement to assist with management  Thrombocytopenia likely secondary to liver disease Platelet count 147   DVT prophylaxis: SCDs  Code Status: Full code  Family Communication: None at bedside  Disposition Plan: Admitted to telemetry unit  Consults called: None.  Admission status: Observation status.   Status is: Observation    Kayleen Memos MD [facility] Pager [phone]  If 7PM-7AM, please contact night-coverage [email or web address] Password [email or web address]  05/11/2022, 2:52 AM

## 2022-05-12 DIAGNOSIS — R600 Localized edema: Secondary | ICD-10-CM | POA: Diagnosis not present

## 2022-05-12 LAB — COMPREHENSIVE METABOLIC PANEL
ALT: 137 U/L — ABNORMAL HIGH (ref 0–44)
AST: 163 U/L — ABNORMAL HIGH (ref 15–41)
Albumin: 3.1 g/dL — ABNORMAL LOW (ref 3.5–5.0)
Alkaline Phosphatase: 106 U/L (ref 38–126)
Anion gap: 8 (ref 5–15)
BUN: 16 mg/dL (ref 6–20)
CO2: 24 mmol/L (ref 22–32)
Calcium: 9 mg/dL (ref 8.9–10.3)
Chloride: 108 mmol/L (ref 98–111)
Creatinine, Ser: 0.6 mg/dL (ref 0.44–1.00)
GFR, Estimated: >60 mL/min (ref >60)
Glucose, Bld: 128 mg/dL — ABNORMAL HIGH (ref 70–99)
Potassium: 3.8 mmol/L (ref 3.5–5.1)
Sodium: 140 mmol/L (ref 135–145)
Total Bilirubin: 11.1 mg/dL — ABNORMAL HIGH (ref 0.3–1.2)
Total Protein: 6.6 g/dL (ref 6.5–8.1)

## 2022-05-12 LAB — CBC WITH DIFFERENTIAL/PLATELET
Abs Immature Granulocytes: 0.09 10*3/uL — ABNORMAL HIGH (ref 0.00–0.07)
Basophils Absolute: 0 10*3/uL (ref 0.0–0.1)
Basophils Relative: 0 %
Eosinophils Absolute: 0 10*3/uL (ref 0.0–0.5)
Eosinophils Relative: 0 %
HCT: 27.6 % — ABNORMAL LOW (ref 36.0–46.0)
Hemoglobin: 9 g/dL — ABNORMAL LOW (ref 12.0–15.0)
Immature Granulocytes: 1 %
Lymphocytes Relative: 5 %
Lymphs Abs: 0.6 10*3/uL — ABNORMAL LOW (ref 0.7–4.0)
MCH: 37.7 pg — ABNORMAL HIGH (ref 26.0–34.0)
MCHC: 32.6 g/dL (ref 30.0–36.0)
MCV: 115.5 fL — ABNORMAL HIGH (ref 80.0–100.0)
Monocytes Absolute: 0.6 10*3/uL (ref 0.1–1.0)
Monocytes Relative: 5 %
Neutro Abs: 9.5 10*3/uL — ABNORMAL HIGH (ref 1.7–7.7)
Neutrophils Relative %: 89 %
Platelets: 124 10*3/uL — ABNORMAL LOW (ref 150–400)
RBC: 2.39 MIL/uL — ABNORMAL LOW (ref 3.87–5.11)
RDW: 16.4 % — ABNORMAL HIGH (ref 11.5–15.5)
WBC: 10.8 10*3/uL — ABNORMAL HIGH (ref 4.0–10.5)
nRBC: 0 % (ref 0.0–0.2)

## 2022-05-12 LAB — MAGNESIUM: Magnesium: 2.1 mg/dL (ref 1.7–2.4)

## 2022-05-12 LAB — PHOSPHORUS: Phosphorus: 2.5 mg/dL (ref 2.5–4.6)

## 2022-05-12 MED ORDER — PREDNISOLONE 5 MG PO TABS: 40.0000 mg | ORAL_TABLET | Freq: Every day | ORAL | 0 refills | Status: DC

## 2022-05-12 MED ORDER — SPIRONOLACTONE 50 MG PO TABS: 50.0000 mg | ORAL_TABLET | Freq: Every day | ORAL | 0 refills | Status: AC

## 2022-05-12 MED ORDER — POLYETHYLENE GLYCOL 3350 17 G PO PACK: 17.0000 g | PACK | Freq: Every day | ORAL | 0 refills | Status: AC | PRN

## 2022-05-12 MED ORDER — FUROSEMIDE 40 MG PO TABS: 40.0000 mg | ORAL_TABLET | Freq: Every day | ORAL | 11 refills | Status: AC

## 2022-05-12 NOTE — Progress Notes (Signed)
[facility] Gastroenterology Progress Note  Deanna Mcmillan 51 y.o. [birth year]  CC: Alcoholic hepatitis, anasarca   Subjective: Patient seen and examined at bedside.  Feeling much better.  Lower extremity swelling improving.  Denies any other issues.  ROS : Afebrile, negative for chest pain   Objective: Vital signs in last 24 hours: Vitals:   05/12/22 0008 05/12/22 0350  BP: 113/60 118/70  Pulse: 78 77  Resp: 18 18  Temp: 98 F (36.7 C) 98.5 F (36.9 C)  SpO2: 98% 99%    Physical Exam:  On physical exam, she is resting comfortably.  Not in acute distress.  Abdomen is mildly distended but soft, bowel sounds present.  Lower extremities showed 2+ pitting edema improved compared to yesterday.    She is alert and oriented x3.  Lab Results: Recent Labs    05/11/22 0542 05/12/22 0640  NA 142 140  K 3.1* 3.8  CL 111 108  CO2 23 24  GLUCOSE 109* 128*  BUN 17 16  CREATININE 0.59 0.60  CALCIUM 8.5* 9.0  MG 2.2 2.1  PHOS 3.2 2.5   Recent Labs    05/11/22 0542 05/12/22 0640  AST 196* 163*  ALT 131* 137*  ALKPHOS 88 106  BILITOT 12.2* 11.1*  PROT 6.2* 6.6  ALBUMIN 3.0* 3.1*   Recent Labs    05/11/22 0542 05/12/22 0640  WBC 10.2 10.8*  NEUTROABS 8.6* 9.5*  HGB 8.7* 9.0*  HCT 26.0* 27.6*  MCV 114.0* 115.5*  PLT 126* 124*   Recent Labs    05/11/22 0542  LABPROT 18.3*  INR 1.5*      Assessment/Plan: Assessment ---------------- -Alcoholic hepatitis.  Was started on prednisolone on October 4th  -Anasarca with ascites -not enough to tap   Recommendations ------------------------ - Continue prednisolone to complete 28 days course.   -Continue Lasix 40 mg daily and spironolactone 50 mg daily. -2 g Sodium diet -No further inpatient GI work-up planned.  Follow-up in GI clinic in few weeks after discharge.  GI will sign off.  Call us back if needed.   Otis Brace MD, [place] 05/12/2022, 12:03 PM  Contact #  [phone]

## 2022-05-12 NOTE — Progress Notes (Signed)
Patient discharged to home with family, discharge instructions reviewed with patient who verbalized understanding. 

## 2022-05-12 NOTE — Discharge Summary (Incomplete)
Physician Discharge Summary   Patient: Deanna Mcmillan MRN: [id number] DOB: February 26, 1971  Admit date:     05/10/2022  Discharge date: 05/12/22  Discharge Physician: Raiford Noble, DO   PCP: Belva Bertin [place], [place]   Recommendations at discharge:   Follow-up with PCP within 1 to 2 weeks repeat CBC, CMP, mag, Phos within 1 week Follow-up with gastroenterology within 1 to 2 weeks and continue prednisone given that prednisone is to prohibitively expensive  Discharge Diagnoses: Principal Problem:   Leg edema Active Problems:   Hypoalbuminemia   Hypokalemia   Serum total bilirubin elevated   Anasarca   Abnormal LFTs   Alcoholic hepatitis   Normal anion gap metabolic acidosis   Hyperglycemia   Coagulopathy (HCC)   Thrombocytopenia (HCC)   Macrocytic anemia   Obesity (BMI 30-39.9)  Resolved Problems:   * No resolved hospital problems. [facility] Course: .  The patient is a 51 year old obese Caucasian female with a past medical history significant for but not limited to formal alcohol abuse, recent admission for acute liver failure and alcoholic hepatitis as well as other comorbidities who presented to the ED with worsening severe bilateral lower extremity edema as well as abdominal distention and to the point where her legs began weeping.  She noticed worsening abdominal distention and continued jaundice and denies any overt bleeding or melena.  She is tried elevate her legs at home and increase her home dose of Lasix and saw her PCP yesterday who advised her to come to the ED for further evaluation.  He was started on IV diuresis and received an IV Lasix 60 mg x 1 and labs were notable for hypokalemia which was repleted in the ED.  She continues to be volume overloaded and has anasarca in the setting of her hypoalbuminemia.  Echocardiogram was recently done and showed no evidence of CHF.    Her leg swelling improved with addition of IV Lasix and she was deemed stable, GI perspective  given that her labs improved slightly.  GI recommended continuing prednisolone however this was too prohibitively expensive so they felt that she would go on prednisone.  GI recommended continuing Lasix 40 mg p.o. daily and spironolactone 50 mg p.o. daily as well as a 2 g sodium diet and following up in the GI clinic in a few weeks.  Assessment and Plan: No notes have been filed under this hospital service. Service: Hospitalist  Severe bilateral lower extremity edema, suspect secondary to third spacing from hypoalbuminemia and anasarca, improving -IV diuresis started in the ED, continue; received IV Lasix 60 mg twice daily and improved and transition to oral Lasix 40 mg p.o. daily and spironolactone 50 mg daily -Add Midodrine 10 mg TID x 3 doses to avoid hypotension and to allow for diuresis -Elevate lower extremities bilaterally -May benefit from unna boots for severe lower extremity edema and change every 2 weeks?  -Continue with diuresis and she had no evidence of heart failure   Elevated liver chemistries in the setting of hepatic steatosis and alcoholic hepatitis seen on abdominal ultrasound on 04/22/2022 Hyperbilirubinemia -Avoid hepatotoxic agents -Continue home lactulose, regimen -Add IV albumin 12.5 g x1 -LFTs are downtrending from prior admission and then AST is now 196 ALT is now 131 -T. bili elevated and at the time of discharge last time was 20.4 is now trending down to 12.2 yesterday and today was 11.1 -GI consulted for further evaluation and they recommended continuing current medication and regimen as above  Hypokalemia -Serum potassium 2.8 on admission improved to 3.1 and at the time of discharge was 3.8 -Continue to replete repleted intravenously and orally. -Mag level was 2.1 -To monitor replete as necessary and repeat CMP and mag in a.m.   Mild non-anion gap metabolic acidosis -Improved.  Patient's CO2 is now 24, anion gap is 8, chloride level is 108 -Continue to  monitor and trend and repeat CMP in a.m.   Hyperglycemia -Serum glucose 136 -Hemoglobin A1c 4.2 on 04/25/2022. -Avoid hypoglycemia    Acute alcoholic hepatitis with coagulopathy -Denies overt bleeding or melena -Continue home prednisolone but then resume home prednisone, and Protonix. -Gastroenterology consulted for further assistance   Thrombocytopenia likely secondary to liver disease -Platelet count 147 on admission and is now 126 but prior to admission at the time of discharge is 124 -Continue monitor for signs and symptoms of bleeding; no overt bleeding noted -Repeat CBC in the a.m.   Hypoalbuminemia -Patient's Albumin Level went from 2.4 -> 2.9 -> 3.0 and at the time of discharge was 3.1 -Continue to Monitor and Trend and Repeat CMP in the AM    Macrocytic Anemia -Patient's Hgb/Hct went from 8.3/23.6 -> 9.5/27.5 -> 8.7/26.0 and 1-9.0/27.6 with an an MCV of 115.5 -Check Anemia Panel in the AM  -Continue to Monitor for S/Sx of Bleeding; No overt Bleeding noted -Repeat CBC in the AM   Obesity -Complicates overall prognosis and care -Estimated body mass index is 38.41 kg/m as calculated from the following:   Height as of this encounter: 5\' 4"  (1.626 m).   Weight as of this encounter: 101.5 kg.  -Weight Loss and Dietary Counseling given  Consultants: Gastroenterology  Procedures performed: As above   Disposition: Home Diet recommendation:  Discharge Diet Orders (From admission, onward)     Start     Ordered   05/12/22 0000  Diet - low sodium heart healthy       Comments: 2 GRAM Sodium Diet   05/12/22 1225           Cardiac diet - 2 Gram Sodium Diet  DISCHARGE MEDICATION: Allergies as of 05/12/2022   No Known Allergies      Medication List     STOP taking these medications    prednisoLONE 5 MG Tabs tablet       TAKE these medications    acetaminophen 500 MG tablet Commonly known as: TYLENOL Take 500 mg by mouth every 6 (six) hours as needed for  headache or moderate pain.   folic acid 1 MG tablet Commonly known as: FOLVITE Take 1 tablet (1 mg total) by mouth daily.   furosemide 40 MG tablet Commonly known as: Lasix Take 1 tablet (40 mg total) by mouth daily. What changed:  medication strength how much to take when to take this   lactulose 10 GM/15ML solution Commonly known as: CHRONULAC Take 30 mLs (20 g total) by mouth 3 (three) times daily.   multivitamin with minerals Tabs tablet Take 1 tablet by mouth daily.   pantoprazole 40 MG tablet Commonly known as: Protonix Take 1 tablet (40 mg total) by mouth daily.   polyethylene glycol 17 g packet Commonly known as: MIRALAX / GLYCOLAX Take 17 g by mouth daily as needed for mild constipation.   predniSONE 20 MG tablet Commonly known as: DELTASONE Take 40 mg by mouth daily.   simethicone 80 MG chewable tablet Commonly known as: MYLICON Chew 1 tablet (80 mg total) by mouth every 6 (six) hours as needed  for flatulence.   spironolactone 50 MG tablet Commonly known as: ALDACTONE Take 1 tablet (50 mg total) by mouth daily.   thiamine 100 MG tablet Commonly known as: Vitamin B-1 Take 1 tablet (100 mg total) by mouth daily.        Discharge Exam: Filed Weights   05/10/22 1425 05/12/22 0350  Weight: 99.8 kg 101.5 kg   Vitals:   05/12/22 0350 05/12/22 1323  BP: 118/70 (!) 117/54  Pulse: 77 90  Resp: 18 18  Temp: 98.5 F (36.9 C) 97.7 F (36.5 C)  SpO2: 99% 100%   Examination: Physical Exam:  Constitutional: WN/WD obese Caucasian Female In NAD appears calm  Respiratory: Diminished to auscultation bilaterally, no wheezing, rales, rhonchi or crackles. Normal respiratory effort and patient is not tachypenic. No accessory muscle use. Unlabored breathing  Cardiovascular: RRR, no murmurs / rubs / gallops. S1 and S2 auscultated. 1-2+ LE Edema  Abdomen: Soft, non-tender, Distended 2/2 body habitus. Bowel sounds positive.  GU: Deferred. Musculoskeletal: No  clubbing / cyanosis of digits/nails. No joint deformity upper and lower extremities. Skin: Patient is jaundiced Neurologic: CN 2-12 grossly intact with no focal deficits. Sensation intact in all 4 Extremities, DTR normal.  Psychiatric: Normal judgment and insight. Alert and oriented x 3. Normal mood and appropriate affect.   Condition at discharge: stable  The results of significant diagnostics from this hospitalization (including imaging, microbiology, ancillary and laboratory) are listed below for reference.   Imaging Studies: [place] ASCITES (ABDOMEN LIMITED)  Result Date: 05/11/2022 CLINICAL DATA:  Jaundice EXAM: LIMITED ABDOMEN ULTRASOUND FOR ASCITES TECHNIQUE: Limited ultrasound survey for ascites was performed in all four abdominal quadrants. COMPARISON:  None Available. FINDINGS: Small volume ascites is noted in the right and left lower quadrant. IMPRESSION: Small volume ascites in the bilateral lower quadrants. Electronically Signed   By: Marin Roberts M.D.   On: 05/11/2022 11:47   DG Chest 2 View  Result Date: 05/10/2022 CLINICAL DATA:  Pleural effusion EXAM: CHEST - 2 VIEW COMPARISON:  04/22/2022 FINDINGS: Cardiac size is within normal limits. There are no signs of pulmonary edema. There are transverse linear densities in both lower lung fields. There is minimal blunting of both lateral CP angles. There is interval decrease in amount of bilateral pleural effusions. There is no pneumothorax. IMPRESSION: Small bilateral pleural effusions, more so on the left side with interval decrease. Linear densities in the lower lung fields suggest subsegmental atelectasis or scarring. Electronically Signed   By: Elmer Picker M.D.   On: 05/10/2022 16:47   DG Abd 1 View  Result Date: 04/24/2022 CLINICAL DATA:  Lower abdominal pain. EXAM: ABDOMEN - 1 VIEW COMPARISON:  None Available. FINDINGS: Air-filled ascending and transverse colon is seen. A paucity of air noted throughout the small bowel, as  well as the descending and sigmoid colon. No significant stool burden is identified. No radio-opaque calculi or other significant radiographic abnormality are seen. IMPRESSION: Nonspecific bowel gas pattern, as described above. Further correlation with chest CT is recommended if an ileus or small-bowel obstruction is of clinical concern. Electronically Signed   By: Virgina Norfolk M.D.   On: 04/24/2022 18:04   ECHOCARDIOGRAM COMPLETE  Result Date: 04/23/2022    ECHOCARDIOGRAM REPORT   Patient Name:   Deanna Mcmillan Date of Exam: 04/23/2022 Medical Rec #:  [id number]        Height:       64.0 in Accession #:    [id number]       Weight:  220.0 lb Date of Birth:  10/18/1970        BSA:          2.037 m Patient Age:    58 years         BP:           122/68 mmHg Patient Gender: F                HR:           79 bpm. Exam Location:  Inpatient Procedure: 2D Echo Indications:    Dyspnea  History:        Patient has no prior history of Echocardiogram examinations.                 Signs/Symptoms:Shortness of Breath.  Sonographer:    Meryle Ready Referring Phys: Andrews  1. Left ventricular ejection fraction, by estimation, is 60 to 65%. The left ventricle has normal function. The left ventricle has no regional wall motion abnormalities. Left ventricular diastolic parameters were normal.  2. Right ventricular systolic function is normal. The right ventricular size is normal. There is mildly elevated pulmonary artery systolic pressure. The estimated right ventricular systolic pressure is 08.6 mmHg.  3. The mitral valve is normal in structure. No evidence of mitral valve regurgitation.  4. The aortic valve was not well visualized. Aortic valve regurgitation is not visualized. No aortic stenosis is present.  5. The inferior vena cava is dilated in size with <50% respiratory variability, suggesting right atrial pressure of 15 mmHg. FINDINGS  Left Ventricle: Left ventricular ejection  fraction, by estimation, is 60 to 65%. The left ventricle has normal function. The left ventricle has no regional wall motion abnormalities. The left ventricular internal cavity size was normal in size. There is  no left ventricular hypertrophy. Left ventricular diastolic parameters were normal. Right Ventricle: The right ventricular size is normal. No increase in right ventricular wall thickness. Right ventricular systolic function is normal. There is mildly elevated pulmonary artery systolic pressure. The tricuspid regurgitant velocity is 2.47  m/s, and with an assumed right atrial pressure of 15 mmHg, the estimated right ventricular systolic pressure is 76.1 mmHg. Left Atrium: Left atrial size was normal in size. Right Atrium: Right atrial size was normal in size. Pericardium: There is no evidence of pericardial effusion. Mitral Valve: The mitral valve is normal in structure. No evidence of mitral valve regurgitation. Tricuspid Valve: The tricuspid valve is normal in structure. Tricuspid valve regurgitation is trivial. Aortic Valve: The aortic valve was not well visualized. Aortic valve regurgitation is not visualized. No aortic stenosis is present. Aortic valve mean gradient measures 7.0 mmHg. Aortic valve peak gradient measures 10.5 mmHg. Aortic valve area, by VTI measures 2.28 cm. Pulmonic Valve: The pulmonic valve was not well visualized. Pulmonic valve regurgitation is not visualized. Aorta: The aortic root and ascending aorta are structurally normal, with no evidence of dilitation. Venous: The inferior vena cava is dilated in size with less than 50% respiratory variability, suggesting right atrial pressure of 15 mmHg. IAS/Shunts: The interatrial septum was not well visualized.  LEFT VENTRICLE PLAX 2D LVIDd:         4.90 cm   Diastology LVIDs:         3.00 cm   LV e' medial:    14.30 cm/s LV PW:         1.00 cm   LV E/e' medial:  7.3 LV IVS:  0.90 cm   LV e' lateral:   16.50 cm/s LVOT diam:     2.00  cm   LV E/e' lateral: 6.4 LV SV:         82 LV SV Index:   40 LVOT Area:     3.14 cm  RIGHT VENTRICLE TAPSE (M-mode): 2.0 cm LEFT ATRIUM             Index        RIGHT ATRIUM           Index LA diam:        3.80 cm 1.87 cm/m   RA Area:     12.20 cm LA Vol (A2C):   38.7 ml 19.00 ml/m  RA Volume:   24.40 ml  11.98 ml/m LA Vol (A4C):   62.0 ml 30.43 ml/m LA Biplane Vol: 51.9 ml 25.48 ml/m  AORTIC VALVE AV Area (Vmax):    2.29 cm AV Area (Vmean):   2.06 cm AV Area (VTI):     2.28 cm AV Vmax:           162.00 cm/s AV Vmean:          125.000 cm/s AV VTI:            0.361 m AV Peak Grad:      10.5 mmHg AV Mean Grad:      7.0 mmHg LVOT Vmax:         118.00 cm/s LVOT Vmean:        82.000 cm/s LVOT VTI:          0.262 m LVOT/AV VTI ratio: 0.73  AORTA Ao Root diam: 3.30 cm Ao Asc diam:  3.20 cm MITRAL VALVE                TRICUSPID VALVE MV Area (PHT): 3.27 cm     TR Peak grad:   24.4 mmHg MV Decel Time: 232 msec     TR Vmax:        247.00 cm/s MV E velocity: 105.00 cm/s MV A velocity: 80.80 cm/s   SHUNTS MV E/A ratio:  1.30         Systemic VTI:  0.26 m                             Systemic Diam: 2.00 cm Oswaldo Milian MD Electronically signed by Oswaldo Milian MD Signature Date/Time: 04/23/2022/2:35:04 PM    Final    US Abdomen Limited RUQ (LIVER/GB)  Result Date: 04/22/2022 CLINICAL DATA:  Liver disorder EXAM: ULTRASOUND ABDOMEN LIMITED RIGHT UPPER QUADRANT COMPARISON:  No prior ultrasound, correlation is made with CT abdomen pelvis 04/11/2013 FINDINGS: Gallbladder: Gallbladder sludge. No gallstones or wall thickening visualized. No sonographic Murphy sign noted by sonographer. Common bile duct: Diameter: 5 mm, within normal limits. No intrahepatic biliary ductal dilatation. Liver: No focal lesion identified. Increased parenchymal echogenicity with coarsened echotexture. Portal vein is patent on color Doppler imaging with normal direction of blood flow towards the liver. Other: None. IMPRESSION: 1.  Hepatic steatosis. 2. Gallbladder sludge. Electronically Signed   By: Merilyn Baba M.D.   On: 04/22/2022 23:52   DG Chest Portable 1 View  Result Date: 04/22/2022 CLINICAL DATA:  Generalized weakness EXAM: PORTABLE CHEST 1 VIEW COMPARISON:  None Available. FINDINGS: Cardiac shadow is within normal limits. Mild bibasilar atelectasis is seen. No other focal infiltrate is noted. No bony abnormality is seen. IMPRESSION: Mild bibasilar atelectasis.  Electronically Signed   By: Inez Catalina M.D.   On: 04/22/2022 22:14    Microbiology: Results for orders placed or performed during the hospital encounter of 04/22/22  MRSA Next Gen by PCR, Nasal     Status: None   Collection Time: 04/24/22 10:35 PM   Specimen: Nasal Mucosa; Nasal Swab  Result Value Ref Range Status   MRSA by PCR Next Gen NOT DETECTED NOT DETECTED Final    Comment: (NOTE) The GeneXpert MRSA Assay (FDA approved for NASAL specimens only), is one component of a comprehensive MRSA colonization surveillance program. It is not intended to diagnose MRSA infection nor to guide or monitor treatment for MRSA infections. Test performance is not FDA approved in patients less than 62 years old. Performed at [facility], [place] [address], [place], [place] [address]    Labs: CBC: Recent Labs  Lab 05/10/22 1502 05/11/22 0542 05/12/22 0640  WBC 11.2* 10.2 10.8*  NEUTROABS  --  8.6* 9.5*  HGB 9.5* 8.7* 9.0*  HCT 27.5* 26.0* 27.6*  MCV 111.8* 114.0* 115.5*  PLT 147* 126* 785*   Basic Metabolic Panel: Recent Labs  Lab 05/10/22 1502 05/11/22 0323 05/11/22 0542 05/12/22 0640  NA 140  --  142 140  K 2.8*  --  3.1* 3.8  CL 110  --  111 108  CO2 20*  --  23 24  GLUCOSE 136*  --  109* 128*  BUN 18  --  17 16  CREATININE 0.65  --  0.59 0.60  CALCIUM 8.7*  --  8.5* 9.0  MG  --  2.1 2.2 2.1  PHOS  --   --  3.2 2.5   Liver Function Tests: Recent Labs  Lab 05/10/22 1502 05/11/22 0542 05/12/22 0640  AST 215* 196*  163*  ALT 138* 131* 137*  ALKPHOS 103 88 106  BILITOT 11.8* 12.2* 11.1*  PROT 6.5 6.2* 6.6  ALBUMIN 2.9* 3.0* 3.1*   CBG: No results for input(s): "GLUCAP" in the last 168 hours.  Discharge time spent: greater than 30 minutes.  Signed: Raiford Noble, DO [facility] 05/13/2022

## 2022-05-13 DIAGNOSIS — K7010 Alcoholic hepatitis without ascites: Secondary | ICD-10-CM

## 2022-05-13 DIAGNOSIS — R601 Generalized edema: Secondary | ICD-10-CM

## 2022-05-13 DIAGNOSIS — E669 Obesity, unspecified: Secondary | ICD-10-CM

## 2022-05-13 DIAGNOSIS — R739 Hyperglycemia, unspecified: Secondary | ICD-10-CM

## 2022-05-13 DIAGNOSIS — E872 Acidosis, unspecified: Secondary | ICD-10-CM

## 2022-05-13 DIAGNOSIS — D689 Coagulation defect, unspecified: Secondary | ICD-10-CM

## 2022-05-13 DIAGNOSIS — D696 Thrombocytopenia, unspecified: Secondary | ICD-10-CM

## 2022-05-13 DIAGNOSIS — D539 Nutritional anemia, unspecified: Secondary | ICD-10-CM

## 2022-05-13 DIAGNOSIS — R7989 Other specified abnormal findings of blood chemistry: Secondary | ICD-10-CM

## 2022-05-29 ENCOUNTER — Emergency Department (HOSPITAL_COMMUNITY): Payer: 59

## 2022-05-29 ENCOUNTER — Inpatient Hospital Stay (HOSPITAL_COMMUNITY)
Admission: EM | Admit: 2022-05-29 | Discharge: 2022-07-23 | DRG: 870 | Disposition: E | Payer: 59 | Attending: Internal Medicine | Admitting: Internal Medicine

## 2022-05-29 ENCOUNTER — Inpatient Hospital Stay (HOSPITAL_COMMUNITY): Payer: 59

## 2022-05-29 DIAGNOSIS — K7011 Alcoholic hepatitis with ascites: Secondary | ICD-10-CM | POA: Diagnosis present

## 2022-05-29 DIAGNOSIS — Y92239 Unspecified place in hospital as the place of occurrence of the external cause: Secondary | ICD-10-CM | POA: Diagnosis not present

## 2022-05-29 DIAGNOSIS — G40901 Epilepsy, unspecified, not intractable, with status epilepticus: Secondary | ICD-10-CM | POA: Diagnosis not present

## 2022-05-29 DIAGNOSIS — Z66 Do not resuscitate: Secondary | ICD-10-CM | POA: Diagnosis present

## 2022-05-29 DIAGNOSIS — J9601 Acute respiratory failure with hypoxia: Secondary | ICD-10-CM | POA: Diagnosis present

## 2022-05-29 DIAGNOSIS — J95851 Ventilator associated pneumonia: Secondary | ICD-10-CM | POA: Diagnosis not present

## 2022-05-29 DIAGNOSIS — Z1152 Encounter for screening for COVID-19: Secondary | ICD-10-CM | POA: Diagnosis not present

## 2022-05-29 DIAGNOSIS — J810 Acute pulmonary edema: Secondary | ICD-10-CM | POA: Diagnosis present

## 2022-05-29 DIAGNOSIS — J150 Pneumonia due to Klebsiella pneumoniae: Secondary | ICD-10-CM | POA: Diagnosis not present

## 2022-05-29 DIAGNOSIS — D696 Thrombocytopenia, unspecified: Secondary | ICD-10-CM | POA: Diagnosis present

## 2022-05-29 DIAGNOSIS — B9561 Methicillin susceptible Staphylococcus aureus infection as the cause of diseases classified elsewhere: Secondary | ICD-10-CM

## 2022-05-29 DIAGNOSIS — T380X5A Adverse effect of glucocorticoids and synthetic analogues, initial encounter: Secondary | ICD-10-CM | POA: Diagnosis present

## 2022-05-29 DIAGNOSIS — I471 Supraventricular tachycardia, unspecified: Secondary | ICD-10-CM | POA: Diagnosis present

## 2022-05-29 DIAGNOSIS — R6520 Severe sepsis without septic shock: Secondary | ICD-10-CM | POA: Insufficient documentation

## 2022-05-29 DIAGNOSIS — D849 Immunodeficiency, unspecified: Secondary | ICD-10-CM | POA: Diagnosis present

## 2022-05-29 DIAGNOSIS — J90 Pleural effusion, not elsewhere classified: Secondary | ICD-10-CM | POA: Diagnosis present

## 2022-05-29 DIAGNOSIS — R739 Hyperglycemia, unspecified: Secondary | ICD-10-CM | POA: Diagnosis not present

## 2022-05-29 DIAGNOSIS — G510 Bell's palsy: Secondary | ICD-10-CM | POA: Diagnosis not present

## 2022-05-29 DIAGNOSIS — B961 Klebsiella pneumoniae [K. pneumoniae] as the cause of diseases classified elsewhere: Secondary | ICD-10-CM | POA: Diagnosis not present

## 2022-05-29 DIAGNOSIS — A419 Sepsis, unspecified organism: Secondary | ICD-10-CM | POA: Diagnosis not present

## 2022-05-29 DIAGNOSIS — R5381 Other malaise: Secondary | ICD-10-CM | POA: Diagnosis not present

## 2022-05-29 DIAGNOSIS — B379 Candidiasis, unspecified: Secondary | ICD-10-CM | POA: Diagnosis not present

## 2022-05-29 DIAGNOSIS — N170 Acute kidney failure with tubular necrosis: Secondary | ICD-10-CM | POA: Diagnosis not present

## 2022-05-29 DIAGNOSIS — G9340 Encephalopathy, unspecified: Secondary | ICD-10-CM

## 2022-05-29 DIAGNOSIS — Z79899 Other long term (current) drug therapy: Secondary | ICD-10-CM

## 2022-05-29 DIAGNOSIS — N186 End stage renal disease: Secondary | ICD-10-CM | POA: Diagnosis not present

## 2022-05-29 DIAGNOSIS — R278 Other lack of coordination: Secondary | ICD-10-CM | POA: Diagnosis not present

## 2022-05-29 DIAGNOSIS — K56609 Unspecified intestinal obstruction, unspecified as to partial versus complete obstruction: Secondary | ICD-10-CM | POA: Diagnosis not present

## 2022-05-29 DIAGNOSIS — E8770 Fluid overload, unspecified: Secondary | ICD-10-CM | POA: Diagnosis present

## 2022-05-29 DIAGNOSIS — J988 Other specified respiratory disorders: Secondary | ICD-10-CM

## 2022-05-29 DIAGNOSIS — R161 Splenomegaly, not elsewhere classified: Secondary | ICD-10-CM | POA: Diagnosis present

## 2022-05-29 DIAGNOSIS — L8990 Pressure ulcer of unspecified site, unspecified stage: Secondary | ICD-10-CM | POA: Insufficient documentation

## 2022-05-29 DIAGNOSIS — R569 Unspecified convulsions: Secondary | ICD-10-CM | POA: Diagnosis not present

## 2022-05-29 DIAGNOSIS — Y95 Nosocomial condition: Secondary | ICD-10-CM | POA: Diagnosis not present

## 2022-05-29 DIAGNOSIS — E876 Hypokalemia: Secondary | ICD-10-CM | POA: Diagnosis present

## 2022-05-29 DIAGNOSIS — J9811 Atelectasis: Secondary | ICD-10-CM | POA: Diagnosis present

## 2022-05-29 DIAGNOSIS — R579 Shock, unspecified: Secondary | ICD-10-CM | POA: Diagnosis not present

## 2022-05-29 DIAGNOSIS — N179 Acute kidney failure, unspecified: Secondary | ICD-10-CM

## 2022-05-29 DIAGNOSIS — I493 Ventricular premature depolarization: Secondary | ICD-10-CM | POA: Diagnosis present

## 2022-05-29 DIAGNOSIS — A3211 Listerial meningitis: Secondary | ICD-10-CM | POA: Diagnosis present

## 2022-05-29 DIAGNOSIS — Z515 Encounter for palliative care: Secondary | ICD-10-CM

## 2022-05-29 DIAGNOSIS — K766 Portal hypertension: Secondary | ICD-10-CM | POA: Diagnosis present

## 2022-05-29 DIAGNOSIS — R7881 Bacteremia: Secondary | ICD-10-CM

## 2022-05-29 DIAGNOSIS — E43 Unspecified severe protein-calorie malnutrition: Secondary | ICD-10-CM | POA: Diagnosis present

## 2022-05-29 DIAGNOSIS — A4101 Sepsis due to Methicillin susceptible Staphylococcus aureus: Principal | ICD-10-CM | POA: Diagnosis present

## 2022-05-29 DIAGNOSIS — B957 Other staphylococcus as the cause of diseases classified elsewhere: Secondary | ICD-10-CM | POA: Diagnosis present

## 2022-05-29 DIAGNOSIS — G709 Myoneural disorder, unspecified: Secondary | ICD-10-CM | POA: Diagnosis not present

## 2022-05-29 DIAGNOSIS — Z7189 Other specified counseling: Secondary | ICD-10-CM | POA: Diagnosis not present

## 2022-05-29 DIAGNOSIS — J690 Pneumonitis due to inhalation of food and vomit: Secondary | ICD-10-CM | POA: Diagnosis present

## 2022-05-29 DIAGNOSIS — Y848 Other medical procedures as the cause of abnormal reaction of the patient, or of later complication, without mention of misadventure at the time of the procedure: Secondary | ICD-10-CM | POA: Diagnosis not present

## 2022-05-29 DIAGNOSIS — D638 Anemia in other chronic diseases classified elsewhere: Secondary | ICD-10-CM | POA: Diagnosis present

## 2022-05-29 DIAGNOSIS — G928 Other toxic encephalopathy: Secondary | ICD-10-CM | POA: Diagnosis present

## 2022-05-29 DIAGNOSIS — B9620 Unspecified Escherichia coli [E. coli] as the cause of diseases classified elsewhere: Secondary | ICD-10-CM | POA: Diagnosis present

## 2022-05-29 DIAGNOSIS — D6489 Other specified anemias: Secondary | ICD-10-CM | POA: Diagnosis present

## 2022-05-29 DIAGNOSIS — E872 Acidosis, unspecified: Secondary | ICD-10-CM | POA: Diagnosis present

## 2022-05-29 DIAGNOSIS — N390 Urinary tract infection, site not specified: Secondary | ICD-10-CM | POA: Diagnosis present

## 2022-05-29 DIAGNOSIS — K7682 Hepatic encephalopathy: Secondary | ICD-10-CM | POA: Diagnosis present

## 2022-05-29 DIAGNOSIS — I4891 Unspecified atrial fibrillation: Secondary | ICD-10-CM | POA: Diagnosis present

## 2022-05-29 DIAGNOSIS — Z1624 Resistance to multiple antibiotics: Secondary | ICD-10-CM | POA: Diagnosis not present

## 2022-05-29 DIAGNOSIS — R6521 Severe sepsis with septic shock: Secondary | ICD-10-CM | POA: Diagnosis present

## 2022-05-29 DIAGNOSIS — A3212 Listerial meningoencephalitis: Secondary | ICD-10-CM | POA: Diagnosis present

## 2022-05-29 DIAGNOSIS — I10 Essential (primary) hypertension: Secondary | ICD-10-CM | POA: Diagnosis not present

## 2022-05-29 DIAGNOSIS — Z7952 Long term (current) use of systemic steroids: Secondary | ICD-10-CM

## 2022-05-29 DIAGNOSIS — A329 Listeriosis, unspecified: Secondary | ICD-10-CM | POA: Insufficient documentation

## 2022-05-29 DIAGNOSIS — Z9911 Dependence on respirator [ventilator] status: Secondary | ICD-10-CM

## 2022-05-29 DIAGNOSIS — R0609 Other forms of dyspnea: Secondary | ICD-10-CM | POA: Diagnosis not present

## 2022-05-29 DIAGNOSIS — E870 Hyperosmolality and hypernatremia: Secondary | ICD-10-CM | POA: Diagnosis present

## 2022-05-29 DIAGNOSIS — E871 Hypo-osmolality and hyponatremia: Secondary | ICD-10-CM | POA: Diagnosis not present

## 2022-05-29 DIAGNOSIS — F05 Delirium due to known physiological condition: Secondary | ICD-10-CM | POA: Diagnosis not present

## 2022-05-29 DIAGNOSIS — K567 Ileus, unspecified: Secondary | ICD-10-CM | POA: Diagnosis not present

## 2022-05-29 DIAGNOSIS — R34 Anuria and oliguria: Secondary | ICD-10-CM | POA: Diagnosis not present

## 2022-05-29 DIAGNOSIS — K521 Toxic gastroenteritis and colitis: Secondary | ICD-10-CM | POA: Diagnosis not present

## 2022-05-29 DIAGNOSIS — L89150 Pressure ulcer of sacral region, unstageable: Secondary | ICD-10-CM | POA: Diagnosis present

## 2022-05-29 DIAGNOSIS — R1110 Vomiting, unspecified: Secondary | ICD-10-CM | POA: Diagnosis not present

## 2022-05-29 DIAGNOSIS — F1010 Alcohol abuse, uncomplicated: Secondary | ICD-10-CM | POA: Diagnosis present

## 2022-05-29 DIAGNOSIS — E8809 Other disorders of plasma-protein metabolism, not elsewhere classified: Secondary | ICD-10-CM | POA: Diagnosis present

## 2022-05-29 DIAGNOSIS — K7031 Alcoholic cirrhosis of liver with ascites: Secondary | ICD-10-CM | POA: Diagnosis present

## 2022-05-29 DIAGNOSIS — T473X5A Adverse effect of saline and osmotic laxatives, initial encounter: Secondary | ICD-10-CM | POA: Diagnosis not present

## 2022-05-29 DIAGNOSIS — Z6829 Body mass index (BMI) 29.0-29.9, adult: Secondary | ICD-10-CM

## 2022-05-29 LAB — CBC WITH DIFFERENTIAL/PLATELET
Abs Immature Granulocytes: 0.2 10*3/uL — ABNORMAL HIGH (ref 0.00–0.07)
Basophils Absolute: 0 10*3/uL (ref 0.0–0.1)
Basophils Relative: 0 %
Eosinophils Absolute: 0 10*3/uL (ref 0.0–0.5)
Eosinophils Relative: 0 %
HCT: 34.3 % — ABNORMAL LOW (ref 36.0–46.0)
Hemoglobin: 11.8 g/dL — ABNORMAL LOW (ref 12.0–15.0)
Immature Granulocytes: 2 %
Lymphocytes Relative: 4 %
Lymphs Abs: 0.5 10*3/uL — ABNORMAL LOW (ref 0.7–4.0)
MCH: 38.1 pg — ABNORMAL HIGH (ref 26.0–34.0)
MCHC: 34.4 g/dL (ref 30.0–36.0)
MCV: 110.6 fL — ABNORMAL HIGH (ref 80.0–100.0)
Monocytes Absolute: 0.5 10*3/uL (ref 0.1–1.0)
Monocytes Relative: 4 %
Neutro Abs: 11.2 10*3/uL — ABNORMAL HIGH (ref 1.7–7.7)
Neutrophils Relative %: 90 %
Platelets: 111 10*3/uL — ABNORMAL LOW (ref 150–400)
RBC: 3.1 MIL/uL — ABNORMAL LOW (ref 3.87–5.11)
RDW: 16.3 % — ABNORMAL HIGH (ref 11.5–15.5)
WBC: 12.5 10*3/uL — ABNORMAL HIGH (ref 4.0–10.5)
nRBC: 0.3 % — ABNORMAL HIGH (ref 0.0–0.2)

## 2022-05-29 LAB — RESP PANEL BY RT-PCR (FLU A&B, COVID) ARPGX2
Influenza A by PCR: NEGATIVE
Influenza B by PCR: NEGATIVE
SARS Coronavirus 2 by RT PCR: NEGATIVE

## 2022-05-29 LAB — I-STAT ARTERIAL BLOOD GAS, ED
Acid-base deficit: 1 mmol/L (ref 0.0–2.0)
Bicarbonate: 25.3 mmol/L (ref 20.0–28.0)
Calcium, Ion: 1.27 mmol/L (ref 1.15–1.40)
HCT: 32 % — ABNORMAL LOW (ref 36.0–46.0)
Hemoglobin: 10.9 g/dL — ABNORMAL LOW (ref 12.0–15.0)
O2 Saturation: 99 %
Patient temperature: 100.5
Potassium: 3.9 mmol/L (ref 3.5–5.1)
Sodium: 139 mmol/L (ref 135–145)
TCO2: 27 mmol/L (ref 22–32)
pCO2 arterial: 49.5 mmHg — ABNORMAL HIGH (ref 32–48)
pH, Arterial: 7.321 — ABNORMAL LOW (ref 7.35–7.45)
pO2, Arterial: 151 mmHg — ABNORMAL HIGH (ref 83–108)

## 2022-05-29 LAB — URINALYSIS, ROUTINE W REFLEX MICROSCOPIC
Bilirubin Urine: NEGATIVE
Glucose, UA: NEGATIVE mg/dL
Ketones, ur: NEGATIVE mg/dL
Nitrite: NEGATIVE
Protein, ur: 100 mg/dL — AB
Specific Gravity, Urine: 1.023 (ref 1.005–1.030)
pH: 5 (ref 5.0–8.0)

## 2022-05-29 LAB — GLUCOSE, CAPILLARY
Glucose-Capillary: 176 mg/dL — ABNORMAL HIGH (ref 70–99)
Glucose-Capillary: 167 mg/dL — ABNORMAL HIGH (ref 70–99)

## 2022-05-29 LAB — HCG, SERUM, QUALITATIVE: Preg, Serum: NEGATIVE

## 2022-05-29 LAB — COMPREHENSIVE METABOLIC PANEL
ALT: 52 U/L — ABNORMAL HIGH (ref 0–44)
AST: 101 U/L — ABNORMAL HIGH (ref 15–41)
Albumin: 2.3 g/dL — ABNORMAL LOW (ref 3.5–5.0)
Alkaline Phosphatase: 100 U/L (ref 38–126)
Anion gap: 13 (ref 5–15)
BUN: 19 mg/dL (ref 6–20)
CO2: 20 mmol/L — ABNORMAL LOW (ref 22–32)
Calcium: 8.8 mg/dL — ABNORMAL LOW (ref 8.9–10.3)
Chloride: 107 mmol/L (ref 98–111)
Creatinine, Ser: 0.91 mg/dL (ref 0.44–1.00)
GFR, Estimated: >60 mL/min (ref >60)
Glucose, Bld: 185 mg/dL — ABNORMAL HIGH (ref 70–99)
Potassium: 4 mmol/L (ref 3.5–5.1)
Sodium: 140 mmol/L (ref 135–145)
Total Bilirubin: 5 mg/dL — ABNORMAL HIGH (ref 0.3–1.2)
Total Protein: 5.7 g/dL — ABNORMAL LOW (ref 6.5–8.1)

## 2022-05-29 LAB — AMMONIA: Ammonia: 91 umol/L — ABNORMAL HIGH (ref 9–35)

## 2022-05-29 LAB — LACTIC ACID, PLASMA
Lactic Acid, Venous: 2.9 mmol/L (ref 0.5–1.9)
Lactic Acid, Venous: 5 mmol/L (ref 0.5–1.9)

## 2022-05-29 LAB — I-STAT BETA HCG BLOOD, ED (MC, WL, AP ONLY): I-stat hCG, quantitative: 20.1 m[IU]/mL — ABNORMAL HIGH (ref <5)

## 2022-05-29 LAB — PROCALCITONIN: Procalcitonin: 1.31 ng/mL

## 2022-05-29 LAB — APTT: aPTT: 28 seconds (ref 24–36)

## 2022-05-29 LAB — PROTIME-INR
INR: 1.4 — ABNORMAL HIGH (ref 0.8–1.2)
Prothrombin Time: 17.2 seconds — ABNORMAL HIGH (ref 11.4–15.2)

## 2022-05-29 MED ORDER — LACTATED RINGERS IV BOLUS (SEPSIS)
1000.0000 mL | Freq: Once | INTRAVENOUS | Status: AC
  Administered 2022-05-29: 1000 mL via INTRAVENOUS

## 2022-05-29 MED ORDER — METHYLPREDNISOLONE SODIUM SUCC 40 MG IJ SOLR
40.0000 mg | Freq: Every day | INTRAMUSCULAR | Status: DC
  Administered 2022-05-29: 40 mg via INTRAVENOUS
  Filled 2022-05-29: qty 1

## 2022-05-29 MED ORDER — SODIUM CHLORIDE 0.9 % IV SOLN: 2.0000 g | Freq: Once | INTRAVENOUS | Status: DC

## 2022-05-29 MED ORDER — DOCUSATE SODIUM 50 MG/5ML PO LIQD
100.0000 mg | Freq: Every day | ORAL | Status: DC | PRN
  Administered 2022-05-31: 100 mg
  Filled 2022-05-29: qty 10

## 2022-05-29 MED ORDER — ORAL CARE MOUTH RINSE: 15.0000 mL | OROMUCOSAL | Status: DC | PRN

## 2022-05-29 MED ORDER — SODIUM CHLORIDE 0.9 % IV SOLN
2.0000 g | Freq: Once | INTRAVENOUS | Status: AC
  Administered 2022-05-29: 2 g via INTRAVENOUS
  Filled 2022-05-29: qty 12.5

## 2022-05-29 MED ORDER — VANCOMYCIN HCL 1750 MG/350ML IV SOLN
1750.0000 mg | Freq: Once | INTRAVENOUS | Status: AC
  Administered 2022-05-29: 1750 mg via INTRAVENOUS
  Filled 2022-05-29: qty 350

## 2022-05-29 MED ORDER — ORAL CARE MOUTH RINSE
15.0000 mL | OROMUCOSAL | Status: DC
  Administered 2022-05-29 – 2022-06-14 (×187): 15 mL via OROMUCOSAL

## 2022-05-29 MED ORDER — SODIUM CHLORIDE 0.9 % IV SOLN
2.0000 g | Freq: Three times a day (TID) | INTRAVENOUS | Status: DC
  Administered 2022-05-30 (×2): 2 g via INTRAVENOUS
  Filled 2022-05-29 (×2): qty 12.5

## 2022-05-29 MED ORDER — LACTATED RINGERS IV SOLN: INTRAVENOUS | Status: AC

## 2022-05-29 MED ORDER — CHLORHEXIDINE GLUCONATE CLOTH 2 % EX PADS
6.0000 | MEDICATED_PAD | Freq: Every day | CUTANEOUS | Status: DC
  Administered 2022-05-29 – 2022-06-02 (×4): 6 via TOPICAL

## 2022-05-29 MED ORDER — FENTANYL CITRATE PF 50 MCG/ML IJ SOSY
PREFILLED_SYRINGE | INTRAMUSCULAR | Status: AC
  Administered 2022-05-29: 50 ug via INTRAVENOUS
  Filled 2022-05-29: qty 2

## 2022-05-29 MED ORDER — POLYETHYLENE GLYCOL 3350 17 G PO PACK
17.0000 g | PACK | Freq: Every day | ORAL | Status: DC
  Administered 2022-05-29 – 2022-05-31 (×3): 17 g
  Filled 2022-05-29 (×4): qty 1

## 2022-05-29 MED ORDER — FENTANYL CITRATE PF 50 MCG/ML IJ SOSY: 50.0000 ug | PREFILLED_SYRINGE | Freq: Once | INTRAMUSCULAR | Status: AC

## 2022-05-29 MED ORDER — LACTULOSE 10 GM/15ML PO SOLN
30.0000 g | Freq: Three times a day (TID) | ORAL | Status: DC
  Administered 2022-05-29 – 2022-06-06 (×23): 30 g
  Filled 2022-05-29 (×24): qty 45

## 2022-05-29 MED ORDER — ACETAMINOPHEN 650 MG RE SUPP
650.0000 mg | Freq: Once | RECTAL | Status: AC
  Administered 2022-05-29: 650 mg via RECTAL
  Filled 2022-05-29: qty 1

## 2022-05-29 MED ORDER — HEPARIN SODIUM (PORCINE) 5000 UNIT/ML IJ SOLN
5000.0000 [IU] | Freq: Three times a day (TID) | INTRAMUSCULAR | Status: DC
  Administered 2022-05-29 – 2022-07-04 (×107): 5000 [IU] via SUBCUTANEOUS
  Filled 2022-05-29 (×108): qty 1

## 2022-05-29 MED ORDER — ETOMIDATE 2 MG/ML IV SOLN
INTRAVENOUS | Status: AC | PRN
  Administered 2022-05-29: 30 mg via INTRAVENOUS

## 2022-05-29 MED ORDER — FAMOTIDINE 20 MG PO TABS
20.0000 mg | ORAL_TABLET | Freq: Two times a day (BID) | ORAL | Status: DC
  Administered 2022-05-29 – 2022-06-14 (×32): 20 mg
  Filled 2022-05-29 (×32): qty 1

## 2022-05-29 MED ORDER — METRONIDAZOLE 500 MG/100ML IV SOLN
500.0000 mg | Freq: Once | INTRAVENOUS | Status: AC
  Administered 2022-05-29: 500 mg via INTRAVENOUS
  Filled 2022-05-29: qty 100

## 2022-05-29 MED ORDER — PROPOFOL 1000 MG/100ML IV EMUL
5.0000 ug/kg/min | INTRAVENOUS | Status: DC
  Administered 2022-05-29: 20 ug/kg/min via INTRAVENOUS

## 2022-05-29 MED ORDER — VANCOMYCIN HCL IN DEXTROSE 1-5 GM/200ML-% IV SOLN: 1000.0000 mg | Freq: Once | INTRAVENOUS | Status: DC

## 2022-05-29 MED ORDER — FENTANYL CITRATE PF 50 MCG/ML IJ SOSY
50.0000 ug | PREFILLED_SYRINGE | INTRAMUSCULAR | Status: DC | PRN
  Administered 2022-05-29: 150 ug via INTRAVENOUS
  Filled 2022-05-29: qty 4
  Filled 2022-05-29: qty 2

## 2022-05-29 MED ORDER — DOCUSATE SODIUM 100 MG PO CAPS: 100.0000 mg | ORAL_CAPSULE | Freq: Two times a day (BID) | ORAL | Status: DC | PRN

## 2022-05-29 MED ORDER — PREDNISONE 20 MG PO TABS: 40.0000 mg | ORAL_TABLET | Freq: Every day | ORAL | Status: DC

## 2022-05-29 MED ORDER — FENTANYL CITRATE PF 50 MCG/ML IJ SOSY: 50.0000 ug | PREFILLED_SYRINGE | Freq: Once | INTRAMUSCULAR | Status: DC

## 2022-05-29 MED ORDER — VANCOMYCIN HCL 1250 MG/250ML IV SOLN
1250.0000 mg | INTRAVENOUS | Status: DC
  Administered 2022-05-31 – 2022-06-01 (×2): 1250 mg via INTRAVENOUS
  Filled 2022-05-29 (×3): qty 250

## 2022-05-29 MED ORDER — POLYETHYLENE GLYCOL 3350 17 G PO PACK
17.0000 g | PACK | Freq: Every day | ORAL | Status: DC | PRN
  Administered 2022-05-31: 17 g
  Filled 2022-05-29: qty 1

## 2022-05-29 MED ORDER — FENTANYL BOLUS VIA INFUSION
50.0000 ug | INTRAVENOUS | Status: DC | PRN
  Administered 2022-05-29 (×2): 100 ug via INTRAVENOUS
  Administered 2022-05-30: 50 ug via INTRAVENOUS
  Administered 2022-05-30: 100 ug via INTRAVENOUS
  Administered 2022-05-31 – 2022-06-01 (×3): 50 ug via INTRAVENOUS
  Administered 2022-06-02 – 2022-06-04 (×7): 100 ug via INTRAVENOUS
  Administered 2022-06-04 – 2022-06-05 (×2): 50 ug via INTRAVENOUS

## 2022-05-29 MED ORDER — PROPOFOL 1000 MG/100ML IV EMUL
0.0000 ug/kg/min | INTRAVENOUS | Status: DC
  Administered 2022-05-29 (×2): 50 ug/kg/min via INTRAVENOUS
  Administered 2022-05-30: 20 ug/kg/min via INTRAVENOUS
  Administered 2022-05-30: 40 ug/kg/min via INTRAVENOUS
  Administered 2022-05-30: 50 ug/kg/min via INTRAVENOUS
  Administered 2022-05-30: 40 ug/kg/min via INTRAVENOUS
  Administered 2022-05-30 – 2022-05-31 (×2): 30 ug/kg/min via INTRAVENOUS
  Administered 2022-05-31: 20 ug/kg/min via INTRAVENOUS
  Administered 2022-06-01: 25 ug/kg/min via INTRAVENOUS
  Administered 2022-06-01: 20 ug/kg/min via INTRAVENOUS
  Administered 2022-06-01: 25 ug/kg/min via INTRAVENOUS
  Administered 2022-06-01: 30 ug/kg/min via INTRAVENOUS
  Administered 2022-06-02: 10 ug/kg/min via INTRAVENOUS
  Filled 2022-05-29 (×13): qty 100

## 2022-05-29 MED ORDER — FENTANYL CITRATE PF 50 MCG/ML IJ SOSY
50.0000 ug | PREFILLED_SYRINGE | INTRAMUSCULAR | Status: DC | PRN
  Filled 2022-05-29: qty 1

## 2022-05-29 MED ORDER — LACTATED RINGERS IV BOLUS (SEPSIS)
500.0000 mL | Freq: Once | INTRAVENOUS | Status: AC
  Administered 2022-05-29: 500 mL via INTRAVENOUS

## 2022-05-29 MED ORDER — DOCUSATE SODIUM 50 MG/5ML PO LIQD
100.0000 mg | Freq: Two times a day (BID) | ORAL | Status: DC
  Administered 2022-05-29 – 2022-06-04 (×12): 100 mg
  Filled 2022-05-29 (×13): qty 10

## 2022-05-29 MED ORDER — FENTANYL 2500MCG IN NS 250ML (10MCG/ML) PREMIX INFUSION
50.0000 ug/h | INTRAVENOUS | Status: DC
  Administered 2022-05-29 – 2022-05-30 (×2): 50 ug/h via INTRAVENOUS
  Administered 2022-05-31 – 2022-06-01 (×2): 100 ug/h via INTRAVENOUS
  Administered 2022-06-03 – 2022-06-04 (×2): 50 ug/h via INTRAVENOUS
  Filled 2022-05-29 (×6): qty 250

## 2022-05-29 MED ORDER — ROCURONIUM BROMIDE 50 MG/5ML IV SOLN
INTRAVENOUS | Status: AC | PRN
  Administered 2022-05-29: 50 mg via INTRAVENOUS

## 2022-05-29 NOTE — Progress Notes (Signed)
Pharmacy Antibiotic Note  Deanna Mcmillan is a 52 y.o. female admitted on 06/09/2022 with sepsis.  Pharmacy has been consulted for vancomycin and cefepime dosing.  Patient presented with altered mental status and was unresponsive in ED. Began to experience respiratory distress and required intubation. On presentation, patient was febrile to 102.3, WBC elevated at 12.5. Team would like to start antibiotics for sepsis. Blood cultures have been collected.   Plan: Give vancomycin 1750mg  IV x1 Start vancomycin 1250mg  IV q24 hours (Scr 0.91, eAUC 440, Vd 0.5) Start cefepime 2g IV q8 hours Monitor clinical status, renal function, culture data, and LOT    Temp (24hrs), Avg:102.3 F (39.1 C), Min:102.3 F (39.1 C), Max:102.3 F (39.1 C)  Recent Labs  Lab 06/04/2022 1950  WBC 12.5*  LATICACIDVEN 2.9*    CrCl cannot be calculated (Unknown ideal weight.).    No Known Allergies  Antimicrobials this admission: vancomycin 11/7 >>  Cefepime 11/7 >>   Dose adjustments this admission: N/A  Microbiology results: 11/7 BCx:  11/7 RVP:   Thank you for allowing pharmacy to be a part of this patient's care.  Louanne Belton, PharmD, [facility] PGY1 Pharmacy Resident 05/24/2022 11:08 PM

## 2022-05-29 NOTE — ED Notes (Signed)
Critical care at bedside  

## 2022-05-29 NOTE — Code Documentation (Signed)
Timeout before intubation

## 2022-05-29 NOTE — H&P (Signed)
NAME:  Deanna Mcmillan, MRN:  [id number], DOB:  [birth year], LOS: 0 ADMISSION DATE:  05/30/2022, CONSULTATION DATE:  05/26/2022 REFERRING MD:  Lendon Colonel, CHIEF COMPLAINT:  altered mental status   History of Present Illness:  51 yo woman with hx of recent diagnosis of alcoholic hepatitis (dx 31/51), here with ams, fever.   History from [name].  Patient was in her normal state of health, until suddenly last night she developed a headache.  She went to sleep to rest.  Today she slept throughout the day, this evening her mother went to wake her and was unable to wake her up, so she called 911.    She has been doing well since her recent hospital discharge, taking all the meds as prescribed, no new meds.  Planned follow up with GI on 11/14.  LE edema had been improving.  No cough, sob no focal symptoms.  No known sick contacts.    In ED, febrile, tachycardic, lethargic, desaturating to 70s.   Intubated by ED physician.   Had been admitted earlier in October and started on course of prenisone for alc hepatitis.   Recently seen at [facility] 10/19: LE Edema. Lasix not helpful at home.  Admitted for diuresis.  Jaundice and abd distension also noted.  Pleural effusion.  GI saw: non enough ascites for tap  Started lasix and spironolactone.  28 day course of prednisolone, miralax, lactulose, miralax. Midodrine TID   In ED got cefepime, LR 1L, flagyl ordered, intubated, started on Propofol, vanc ordered  Pertinent  Medical History  Etoh Cirrhosis BLE edema recently worsening  S/p lap appy 2014   Echo: recent normal 76/1607  Home meds: folic ascid, acetaminohpen, lasix 40bid, lactulose 30 TID protonix, prednisone (04/29/22), prednisolone 04/28/22 three week course, thiamine, simethicone,   Per her mother she had been drinking at least one bottle of wine daily, she isnt clear exactly how much she was drinking but she did also drink [place].  She notes she had wanted to stop recently but been  unable to.  She thinks she was depressed after experiencing perimenopause.  No tobacco.  Significant Hospital Events: Including procedures, antibiotic start and stop dates in addition to other pertinent events     Interim History / Subjective:    Objective   Blood pressure (!) 163/83, pulse (!) 142, temperature (!) 102.3 F (39.1 C), temperature source Axillary, resp. rate (!) 43, SpO2 98 %.       No intake or output data in the 24 hours ending 06/20/2022 1947 There were no vitals filed for this visit.  Examination: General: Intubated tachypneic on vent HENT: Pupils pinpoint, scleral icterus, some conjunctival edema Lungs: CTAB anteriorly decreased breath sounds at L base Cardiovascular: tachycardic  Abdomen: NT, slightly distended, minimal bowel sounds  Extremities: 2+ pitting edema, wrinkled skin, patches of edema over anterior ankles, R lateral knee  Neuro: sedated, not following commands  GU: foley in place   Resolved Hospital Problem list     Assessment & Plan:  Acute encephalopathy:  CT head pending to r/o hemorrhage, given acuity of headache and ams.   Likely metabolic in the setting of sepsis.  CMP pending.  Ammonia stable.   Respiratory failure, hypoxemic:  L base effusion, consolidation vs atelectasis Cont mech vent.   Sedation and pain control prn for vent synchrony and comfort.  Avoid net positive fluid balance.  Monitor I/O.  Covid negative, flu negative.    Fever, sepsis:  Leukocytosis Source not  clear from symptoms and history.  Consider PNA.  CT abd pending - if adequate ascites consider paracentesis.  Consider LP.  Urine strep ag and legionella pending.  Cefepime, flagyl, vanc started.   Alcoholic hepatitis: diagnosed by GI on prior admission.   Hypoalbuminemia.  INR 1.4 today,.  Continue Steroids, will do IV to ensure adequate absorption.   Cont lactulose., protonix.   No etoh since early October.   Cont thiamine, folate., [facility] Best Practice  (right click and "Reselect all SmartList Selections" daily)   Diet/type: NPO DVT prophylaxis: prophylactic heparin  GI prophylaxis: PPI Lines: N/A Foley:  N/A Code Status:  full code Last date of multidisciplinary goals of care discussion []   Labs   CBC: No results for input(s): "WBC", "NEUTROABS", "HGB", "HCT", "MCV", "PLT" in the last 168 hours.  Basic Metabolic Panel: No results for input(s): "NA", "K", "CL", "CO2", "GLUCOSE", "BUN", "CREATININE", "CALCIUM", "MG", "PHOS" in the last 168 hours. GFR: CrCl cannot be calculated (Unknown ideal weight.). No results for input(s): "PROCALCITON", "WBC", "LATICACIDVEN" in the last 168 hours.  Liver Function Tests: No results for input(s): "AST", "ALT", "ALKPHOS", "BILITOT", "PROT", "ALBUMIN" in the last 168 hours. No results for input(s): "LIPASE", "AMYLASE" in the last 168 hours. No results for input(s): "AMMONIA" in the last 168 hours.  ABG    Component Value Date/Time   HCO3 24.2 04/23/2022 0453   O2SAT 66.8 04/23/2022 0453     Coagulation Profile: No results for input(s): "INR", "PROTIME" in the last 168 hours.  Cardiac Enzymes: No results for input(s): "CKTOTAL", "CKMB", "CKMBINDEX", "TROPONINI" in the last 168 hours.  HbA1C: Hgb A1c MFr Bld  Date/Time Value Ref Range Status  04/25/2022 03:41 AM <4.2 (L) 4.8 - 5.6 % Final    Comment:    (NOTE) **Verified by repeat analysis**         Prediabetes: 5.7 - 6.4         Diabetes: >6.4         Glycemic control for adults with diabetes: <7.0     CBG: No results for input(s): "GLUCAP" in the last 168 hours.  Review of Systems:   Unable to assess  Past Medical History:  She,  has no past medical history on file.   Surgical History:   Past Surgical History:  Procedure Laterality Date   KNEE ARTHROSCOPY W/ ACL RECONSTRUCTION Left 1994/5   Dr. Weston Anna   LAPAROSCOPIC APPENDECTOMY N/A 04/11/2013   Procedure: APPENDECTOMY LAPAROSCOPIC;  Surgeon: Stark Klein, MD;   Location: [place];  Service: General;  Laterality: N/A;   [place]   3 extractions     Social History:   reports that she has never smoked. She does not have any smokeless tobacco history on file. She reports current alcohol use. She reports that she does not use drugs.   Family History:  Her family history is not on file.   Allergies No Known Allergies   Home Medications  Prior to Admission medications   Medication Sig Start Date End Date Taking? Authorizing Provider  acetaminophen (TYLENOL) 500 MG tablet Take 500 mg by mouth every 6 (six) hours as needed for headache or moderate pain.    [provider]  folic acid (FOLVITE) 1 MG tablet Take 1 tablet (1 mg total) by mouth daily. 04/28/22   Hosie Poisson, MD  furosemide (LASIX) 40 MG tablet Take 1 tablet (40 mg total) by mouth daily. 05/12/22 05/12/23  Raiford Noble Latif, DO  lactulose (  CHRONULAC) 10 GM/15ML solution Take 30 mLs (20 g total) by mouth 3 (three) times daily. 04/27/22   Hosie Poisson, MD  Multiple Vitamin (MULTIVITAMIN WITH MINERALS) TABS tablet Take 1 tablet by mouth daily. 04/28/22   Hosie Poisson, MD  pantoprazole (PROTONIX) 40 MG tablet Take 1 tablet (40 mg total) by mouth daily. 04/27/22 04/27/23  Hosie Poisson, MD  polyethylene glycol (MIRALAX / GLYCOLAX) 17 g packet Take 17 g by mouth daily as needed for mild constipation. 05/12/22   Sheikh, Omair Latif, DO  predniSONE (DELTASONE) 20 MG tablet Take 40 mg by mouth daily. 04/29/22   [provider]  simethicone (MYLICON) 80 MG chewable tablet Chew 1 tablet (80 mg total) by mouth every 6 (six) hours as needed for flatulence. Patient not taking: Reported on 05/11/2022 04/27/22   Hosie Poisson, MD  spironolactone (ALDACTONE) 50 MG tablet Take 1 tablet (50 mg total) by mouth daily. 05/13/22   Raiford Noble Latif, DO  thiamine (VITAMIN B-1) 100 MG tablet Take 1 tablet (100 mg total) by mouth daily. 04/28/22   Hosie Poisson, MD     Critical  care time: 45 minutes

## 2022-05-29 NOTE — Progress Notes (Signed)
eLink Physician-Brief Progress Note Patient Name: Deanna Mcmillan DOB: 04-23-1971 MRN: [id number]   Date of Service  06/14/2022  HPI/Events of Note  Patient with known ETOH liver disease admitted with fever, altered mental status, sepsis, and acute respiratory failure requiring intubation and mechanical ventilation, work up is in progress.  eICU Interventions  New Patient Evaluation.        [name] [name] 06/10/2022, 11:25 PM

## 2022-05-29 NOTE — ED Triage Notes (Signed)
Pt bib [facility] from home after family called out reporting AMS. Per family pt altered since 109 last night after complaining of migraine. EMS noted pt to be covered in urine, tachypnic and tachycardic. Sats low 80s, 90s on NRB. GCS 5 upon arrival. Hx "liver isssues" per family.  BP 140/90 RR 50 HR 140s SPO2 90s NRB CBG 200

## 2022-05-29 NOTE — Sepsis Progress Note (Signed)
Elink monitoring for the code sepsis protocol.  

## 2022-05-29 NOTE — ED Provider Notes (Signed)
[facility] EMERGENCY DEPARTMENT Provider Note   CSN: [id number] Arrival date & time: 06/09/2022  1928     History  No chief complaint on file.   Deanna Mcmillan is a 51 y.o. female.  The history is provided by the patient and medical records. No language interpreter was used.  Altered Mental Status Presenting symptoms: unresponsiveness   Severity:  Severe Most recent episode:  More than 2 days ago Episode history:  Continuous Timing:  Unable to specify Progression:  Unable to specify Context: recent illness   Associated symptoms: fever    LVL 5 caveat for AMS and unresponsiveness     Home Medications Prior to Admission medications   Medication Sig Start Date End Date Taking? Authorizing Provider  acetaminophen (TYLENOL) 500 MG tablet Take 500 mg by mouth every 6 (six) hours as needed for headache or moderate pain.    [provider]  folic acid (FOLVITE) 1 MG tablet Take 1 tablet (1 mg total) by mouth daily. 04/28/22   Hosie Poisson, MD  furosemide (LASIX) 40 MG tablet Take 1 tablet (40 mg total) by mouth daily. 05/12/22 05/12/23  Raiford Noble Latif, DO  lactulose (CHRONULAC) 10 GM/15ML solution Take 30 mLs (20 g total) by mouth 3 (three) times daily. 04/27/22   Hosie Poisson, MD  Multiple Vitamin (MULTIVITAMIN WITH MINERALS) TABS tablet Take 1 tablet by mouth daily. 04/28/22   Hosie Poisson, MD  pantoprazole (PROTONIX) 40 MG tablet Take 1 tablet (40 mg total) by mouth daily. 04/27/22 04/27/23  Hosie Poisson, MD  polyethylene glycol (MIRALAX / GLYCOLAX) 17 g packet Take 17 g by mouth daily as needed for mild constipation. 05/12/22   Sheikh, Omair Latif, DO  predniSONE (DELTASONE) 20 MG tablet Take 40 mg by mouth daily. 04/29/22   [provider]  simethicone (MYLICON) 80 MG chewable tablet Chew 1 tablet (80 mg total) by mouth every 6 (six) hours as needed for flatulence. Patient not taking: Reported on 05/11/2022 04/27/22   Hosie Poisson, MD   spironolactone (ALDACTONE) 50 MG tablet Take 1 tablet (50 mg total) by mouth daily. 05/13/22   Raiford Noble Latif, DO  thiamine (VITAMIN B-1) 100 MG tablet Take 1 tablet (100 mg total) by mouth daily. 04/28/22   Hosie Poisson, MD      Allergies    Patient has no known allergies.    Review of Systems   Review of Systems  Unable to perform ROS: Patient unresponsive  Constitutional:  Positive for fever.    Physical Exam Updated Vital Signs BP (!) 167/69   Pulse (!) 144   Temp (!) 102.3 F (39.1 C) (Axillary)   Resp (!) 49   SpO2 98%  Physical Exam Vitals and nursing note reviewed.  Constitutional:      General: She is in acute distress.     Appearance: She is ill-appearing.     Interventions: Face mask in place.  HENT:     Head: Atraumatic.     Mouth/Throat:     Mouth: Mucous membranes are dry.  Eyes:     General: Scleral icterus present.  Cardiovascular:     Rate and Rhythm: Tachycardia present.     Heart sounds: No murmur heard. Pulmonary:     Effort: Respiratory distress present.     Breath sounds: Rhonchi present.  Chest:     Chest wall: No tenderness.  Abdominal:     General: There is distension.     Tenderness: There is no abdominal  tenderness. There is no guarding or rebound.  Musculoskeletal:        General: No tenderness.     Cervical back: No tenderness.     Right lower leg: Edema present.     Left lower leg: Edema present.  Skin:    Capillary Refill: Capillary refill takes less than 2 seconds.     Findings: Erythema present.  Neurological:     Mental Status: She is unresponsive.     GCS: GCS eye subscore is 1. GCS verbal subscore is 1. GCS motor subscore is 2.     ED Results / Procedures / Treatments   Labs (all labs ordered are listed, but only abnormal results are displayed) Labs Reviewed  LACTIC ACID, PLASMA - Abnormal; Notable for the following components:      Result Value   Lactic Acid, Venous 2.9 (*)    All other components within  normal limits  LACTIC ACID, PLASMA - Abnormal; Notable for the following components:   Lactic Acid, Venous 5.0 (*)    All other components within normal limits  CBC WITH DIFFERENTIAL/PLATELET - Abnormal; Notable for the following components:   WBC 12.5 (*)    RBC 3.10 (*)    Hemoglobin 11.8 (*)    HCT 34.3 (*)    MCV 110.6 (*)    MCH 38.1 (*)    RDW 16.3 (*)    Platelets 111 (*)    nRBC 0.3 (*)    Neutro Abs 11.2 (*)    Lymphs Abs 0.5 (*)    Abs Immature Granulocytes 0.20 (*)    All other components within normal limits  PROTIME-INR - Abnormal; Notable for the following components:   Prothrombin Time 17.2 (*)    INR 1.4 (*)    All other components within normal limits  URINALYSIS, ROUTINE W REFLEX MICROSCOPIC - Abnormal; Notable for the following components:   Color, Urine AMBER (*)    APPearance CLOUDY (*)    Hgb urine dipstick SMALL (*)    Protein, ur 100 (*)    Leukocytes,Ua SMALL (*)    Bacteria, UA MANY (*)    Non Squamous Epithelial 0-5 (*)    All other components within normal limits  AMMONIA - Abnormal; Notable for the following components:   Ammonia 91 (*)    All other components within normal limits  GLUCOSE, CAPILLARY - Abnormal; Notable for the following components:   Glucose-Capillary 176 (*)    All other components within normal limits  COMPREHENSIVE METABOLIC PANEL - Abnormal; Notable for the following components:   CO2 20 (*)    Glucose, Bld 185 (*)    Calcium 8.8 (*)    Total Protein 5.7 (*)    Albumin 2.3 (*)    AST 101 (*)    ALT 52 (*)    Total Bilirubin 5.0 (*)    All other components within normal limits  GLUCOSE, CAPILLARY - Abnormal; Notable for the following components:   Glucose-Capillary 167 (*)    All other components within normal limits  I-STAT BETA HCG BLOOD, ED ([facility], [facility], [facility] ONLY) - Abnormal; Notable for the following components:   I-stat hCG, quantitative 20.1 (*)    All other components within normal limits  I-STAT ARTERIAL BLOOD  GAS, ED - Abnormal; Notable for the following components:   pH, Arterial 7.321 (*)    pCO2 arterial 49.5 (*)    pO2, Arterial 151 (*)    HCT 32.0 (*)    Hemoglobin 10.9 (*)  All other components within normal limits  RESP PANEL BY RT-PCR (FLU A&B, COVID) ARPGX2  CULTURE, BLOOD (ROUTINE X 2)  CULTURE, BLOOD (ROUTINE X 2)  URINE CULTURE  SARS CORONAVIRUS 2 BY RT PCR  MRSA NEXT GEN BY PCR, NASAL  APTT  HCG, SERUM, QUALITATIVE  TRIGLYCERIDES  PROCALCITONIN  PROCALCITONIN  STREP PNEUMONIAE URINARY ANTIGEN  LEGIONELLA PNEUMOPHILA SEROGP 1 UR AG  CBC  COMPREHENSIVE METABOLIC PANEL  MAGNESIUM  PHOSPHORUS  PROTIME-INR  BLOOD GAS, ARTERIAL  AMMONIA  LACTIC ACID, PLASMA    EKG EKG Interpretation  Date/Time:  Tuesday May 29 2022 19:33:24 EST Ventricular Rate:  139 PR Interval:  89 QRS Duration: 82 QT Interval:  346 QTC Calculation: 527 R Axis:   82 Text Interpretation: Sinus tachycardia with irregular rate Borderline repolarization abnormality Prolonged QT interval when compared to prior, faster rate. No STEMI Confirmed by Antony Blackbird [phone]) on 06/05/2022 7:55:00 PM  Radiology CT ABDOMEN PELVIS WO CONTRAST  Result Date: 06/06/2022 CLINICAL DATA:  Cirrhosis. Altered mental status. EXAM: CT ABDOMEN AND PELVIS WITHOUT CONTRAST TECHNIQUE: Multidetector CT imaging of the abdomen and pelvis was performed following the standard protocol without IV contrast. RADIATION DOSE REDUCTION: This exam was performed according to the departmental dose-optimization program which includes automated exposure control, adjustment of the mA and/or kV according to patient size and/or use of iterative reconstruction technique. COMPARISON:  Ultrasound 04/22/2022 FINDINGS: Lower chest: Moderate bilateral pleural effusions and compressive atelectasis. Enteric tube decompresses the esophagus. Hepatobiliary: 7 mm hypodensity in the right hepatic dome series 3, image 12. Nodular hepatic contours typical  of cirrhosis. Motion artifact and arms down positioning limits detailed hepatic assessment. There is high-density material in the gallbladder may represent sludge. No gallstones were seen on recent exam. No biliary dilatation. Pancreas: No specific pancreatic inflammation. No ductal dilatation. Spleen: Enlarged spanning 14.2 x 5.2 x 12.8 cm (volume = 490 cm^3). No focal abnormality on this unenhanced exam. Adrenals/Urinary Tract: Normal adrenal glands. No hydronephrosis or renal calculi. No obvious focal renal lesion on this unenhanced exam. Decompressed ureters. Urinary bladder is decompressed by Foley catheter. Stomach/Bowel: Enteric tube tip in the stomach. Small amount of fluid in the gastric lumen. Small duodenal diverticulum. No small bowel obstruction. There are enteric sutures at the base of the cecum suggesting prior appendectomy. Mild low-density wall thickening of the cecum and ascending colon, likely portal colopathy. Small volume of formed stool in the colon. Descending and sigmoid colonic diverticulosis without diverticulitis. Vascular/Lymphatic: Normal caliber abdominal aorta. No portal venous or mesenteric gas. No bulky abdominopelvic adenopathy. Reproductive: Unremarkable appearance of the uterus. Limited adnexal assessment due to ascites, no evidence of mass. Other: Moderate volume abdominopelvic ascites measuring simple fluid density. Generalized mesenteric and body wall edema. No free intra-abdominal air. Musculoskeletal: L5-S1 degenerative disc disease. Lower lumbar facet hypertrophy. There are no acute or suspicious osseous abnormalities. IMPRESSION: 1. Cirrhosis with portal hypertension and splenomegaly. Moderate volume abdominopelvic ascites. 2. Moderate bilateral pleural effusions and compressive atelectasis. Generalized mesenteric and body wall edema. 3. High-density material in the gallbladder may represent sludge. No gallstones were seen on recent exam. 4. Mild low-density wall thickening  of the cecum and ascending colon, likely portal colopathy. 5. Colonic diverticulosis without diverticulitis. Electronically Signed   By: Keith Rake M.D.   On: 05/30/2022 22:12   CT HEAD WO CONTRAST (5MM)  Result Date: 06/16/2022 CLINICAL DATA:  Altered mental status; sudden severe headache EXAM: CT HEAD WITHOUT CONTRAST TECHNIQUE: Contiguous axial images were obtained from the base  of the skull through the vertex without intravenous contrast. RADIATION DOSE REDUCTION: This exam was performed according to the departmental dose-optimization program which includes automated exposure control, adjustment of the mA and/or kV according to patient size and/or use of iterative reconstruction technique. COMPARISON:  None Available. FINDINGS: Brain: No intracranial hemorrhage, mass effect, or evidence of acute infarct. No hydrocephalus. No extra-axial fluid collection. Vascular: No hyperdense vessel or unexpected calcification. Skull: No fracture or focal lesion. Sinuses/Orbits: No acute finding. Paranasal sinuses and mastoid air cells are well aerated. Other: None. IMPRESSION: No acute intracranial abnormality. Electronically Signed   By: Placido Sou M.D.   On: 06/07/2022 22:07   DG Chest Port 1 View  Result Date: 06/19/2022 CLINICAL DATA:  Endotracheal tube placement. Questionable sepsis. EXAM: PORTABLE CHEST 1 VIEW COMPARISON:  05/10/2022 FINDINGS: Endotracheal tube tip is 3 cm from the carina. Enteric tube tip is below the diaphragm not included in the field of view. There is increasing hazy opacity in the left lower hemithorax with associated basilar volume loss likely representing effusion and airspace disease/atelectasis. Patchy right infrahilar opacity. No pneumothorax. IMPRESSION: 1. Endotracheal tube tip 3 cm from the carina. Enteric tube tip below the diaphragm not included in the field of view. 2. Increasing hazy opacity in the left lower hemithorax with associated basilar volume loss likely  representing effusion and airspace disease/atelectasis. 3. Patchy right infrahilar opacity, atelectasis versus pneumonia. Electronically Signed   By: Keith Rake M.D.   On: 06/08/2022 20:49    Procedures Procedure Name: Intubation Date/Time: 06/04/2022 11:36 PM  Performed by: Courtney Paris, MDPre-anesthesia Checklist: Patient identified, Timeout performed, Emergency Drugs available, Suction available and Patient being monitored Oxygen Delivery Method: Non-rebreather mask Preoxygenation: Pre-oxygenation with 100% oxygen Induction Type: Rapid sequence Laryngoscope Size: Glidescope Grade View: Grade I Tube size: 7.5 mm Number of attempts: 1 Placement Confirmation: ETT inserted through vocal cords under direct vision, CO2 detector, Breath sounds checked- equal and bilateral and Positive ETCO2 Secured at: 23 cm Tube secured with: ETT holder Dental Injury: Teeth and Oropharynx as per pre-operative assessment         CRITICAL CARE Performed by: Gwenyth Allegra Domenic Schoenberger Total critical care time: 45 minutes Critical care time was exclusive of separately billable procedures and treating other patients. Critical care was necessary to treat or prevent imminent or life-threatening deterioration. Critical care was time spent personally by me on the following activities: development of treatment plan with patient and/or surrogate as well as nursing, discussions with consultants, evaluation of patient's response to treatment, examination of patient, obtaining history from patient or surrogate, ordering and performing treatments and interventions, ordering and review of laboratory studies, ordering and review of radiographic studies, pulse oximetry and re-evaluation of patient's condition.   Medications Ordered in ED Medications  lactated ringers infusion (has no administration in time range)  vancomycin (VANCOREADY) IVPB 1750 mg/350 mL (1,750 mg Intravenous New Bag/Given 05/25/2022 2324)   polyethylene glycol (MIRALAX / GLYCOLAX) packet 17 g (has no administration in time range)  heparin injection 5,000 Units (5,000 Units Subcutaneous Given 06/04/2022 2218)  famotidine (PEPCID) tablet 20 mg (20 mg Per Tube Given 05/23/2022 2218)  docusate (COLACE) 50 MG/5ML liquid 100 mg (has no administration in time range)  docusate (COLACE) 50 MG/5ML liquid 100 mg (100 mg Per Tube Given 06/02/2022 2218)  polyethylene glycol (MIRALAX / GLYCOLAX) packet 17 g (17 g Per Tube Given 05/28/2022 2218)  propofol (DIPRIVAN) 1000 MG/100ML infusion (50 mcg/kg/min  101 kg Intravenous New Bag/Given 05/23/2022  2235)  lactulose (CHRONULAC) 10 GM/15ML solution 30 g (30 g Per Tube Given 06/18/2022 2218)  methylPREDNISolone sodium succinate (SOLU-MEDROL) 40 mg/mL injection 40 mg (40 mg Intravenous Given 06/03/2022 2216)  fentaNYL (SUBLIMAZE) injection 50 mcg (has no administration in time range)  fentaNYL 2532mcg in NS 236mL (65mcg/ml) infusion-PREMIX (50 mcg/hr Intravenous New Bag/Given 06/06/2022 2133)  fentaNYL (SUBLIMAZE) bolus via infusion 50-100 mcg (100 mcg Intravenous Bolus from Bag 06/02/2022 2200)  Chlorhexidine Gluconate Cloth 2 % PADS 6 each (6 each Topical Given 05/23/2022 2218)  Oral care mouth rinse (15 mLs Mouth Rinse Given 06/19/2022 2319)  Oral care mouth rinse (has no administration in time range)  vancomycin (VANCOREADY) IVPB 1250 mg/250 mL (has no administration in time range)  ceFEPIme (MAXIPIME) 2 g in sodium chloride 0.9 % 100 mL IVPB (has no administration in time range)  lactated ringers bolus 1,000 mL (0 mLs Intravenous Stopped 06/12/2022 2321)    And  lactated ringers bolus 1,000 mL (0 mLs Intravenous Stopped 06/14/2022 2129)    And  lactated ringers bolus 1,000 mL (0 mLs Intravenous Stopped 05/31/2022 2222)    And  lactated ringers bolus 500 mL (500 mLs Intravenous New Bag/Given 06/18/2022 2321)  ceFEPIme (MAXIPIME) 2 g in sodium chloride 0.9 % 100 mL IVPB (0 g Intravenous Stopped 06/15/2022 2126)  metroNIDAZOLE (FLAGYL)  IVPB 500 mg (500 mg Intravenous New Bag/Given 06/07/2022 2221)  etomidate (AMIDATE) injection (30 mg Intravenous Given 06/04/2022 1948)  rocuronium (ZEMURON) injection (50 mg Intravenous Given 06/21/2022 1949)  fentaNYL (SUBLIMAZE) injection 50 mcg (50 mcg Intravenous Given 05/23/2022 2106)  acetaminophen (TYLENOL) suppository 650 mg (650 mg Rectal Given 06/06/2022 2138)    ED Course/ Medical Decision Making/ A&P                           Medical Decision Making Amount and/or Complexity of Data Reviewed Labs: ordered. Radiology: ordered. ECG/medicine tests: ordered.  Risk Prescription drug management. Decision regarding hospitalization.    DOMINIC RHOME is a 51 y.o. female with a past medical history significant for liver disease related to previous alcohol, previous appendicitis status post appendectomy, and previous anasarca who presents for altered mental status and respiratory distress.  According to EMS, patient was last normal last night and then today was unable to be woken up by family.  EMS reports he was warm to the touch but they did not get a temperature.  She was tachycardic and tachypneic and oxygen saturations were in the 80s on room air.  She is on nonrebreather sats are in the 90s.  They report her GCS is approximately 5 and patient is minimally responsive.  On arrival, airway was assessed and I am concerned about her airway.  She had a GCS of 4 and does not appear to be protecting her airway.  Breath sounds were extremely coarse bilaterally and she was tachycardic into the 150s and tachypneic into the 50s.  Clinically I do feel she needs to be intubated.  Code sepsis was activated after a temperature in her axilla was over 102.  Patient intubated without difficulty and critical care was called for admission and further management.  Many labs were ordered with a code sepsis protocol.  Family arrived and reports that she started having some headache yesterday that is new for her.   They did not report her having any neck pain or stiffness and she was not complaining of new abdominal pain, nausea, vomiting.  They did  not report any recent URI symptoms that her knowledge.  Patient received broad-spectrum antibiotics and will be admitted.  Given the altered mental status, it may be due to the hypoxia and sepsis and will defer to admitting team as to further diagnostic work-up to assess location of infection.  Considerations are broad from viral infection, URI, UTI, pneumonia with her coarse breath sounds or even intra-abdominal infection versus meningitis.  We will get CT head due to the headache to make sure there is not some bleeding from a coagulopathy from her liver.  Critical care agrees with admission plan and they will continue work-up.  Patient was admitted for further management in the ICU.         Final Clinical Impression(s) / ED Diagnoses Final diagnoses:  Sepsis with acute hypoxic respiratory failure, due to unspecified organism, unspecified whether septic shock present (HCC)    Clinical Impression: 1. Sepsis with acute hypoxic respiratory failure, due to unspecified organism, unspecified whether septic shock present [facility])     Disposition: Admit  This note was prepared with assistance of Dragon voice recognition software. Occasional wrong-word or sound-a-like substitutions may have occurred due to the inherent limitations of voice recognition software.           Estevon Fluke, Gwenyth Allegra, MD 05/28/2022 2342

## 2022-05-30 ENCOUNTER — Inpatient Hospital Stay (HOSPITAL_COMMUNITY): Payer: 59

## 2022-05-30 DIAGNOSIS — J9601 Acute respiratory failure with hypoxia: Secondary | ICD-10-CM | POA: Diagnosis not present

## 2022-05-30 LAB — COMPREHENSIVE METABOLIC PANEL
ALT: 52 U/L — ABNORMAL HIGH (ref 0–44)
AST: 90 U/L — ABNORMAL HIGH (ref 15–41)
Albumin: 2.3 g/dL — ABNORMAL LOW (ref 3.5–5.0)
Alkaline Phosphatase: 93 U/L (ref 38–126)
Anion gap: 13 (ref 5–15)
BUN: 18 mg/dL (ref 6–20)
CO2: 19 mmol/L — ABNORMAL LOW (ref 22–32)
Calcium: 8.7 mg/dL — ABNORMAL LOW (ref 8.9–10.3)
Chloride: 108 mmol/L (ref 98–111)
Creatinine, Ser: 0.79 mg/dL (ref 0.44–1.00)
GFR, Estimated: >60 mL/min (ref >60)
Glucose, Bld: 183 mg/dL — ABNORMAL HIGH (ref 70–99)
Potassium: 4 mmol/L (ref 3.5–5.1)
Sodium: 140 mmol/L (ref 135–145)
Total Bilirubin: 5 mg/dL — ABNORMAL HIGH (ref 0.3–1.2)
Total Protein: 5.6 g/dL — ABNORMAL LOW (ref 6.5–8.1)

## 2022-05-30 LAB — BLOOD CULTURE ID PANEL (REFLEXED) - BCID2
A.calcoaceticus-baumannii: NOT DETECTED
Bacteroides fragilis: NOT DETECTED
Candida albicans: NOT DETECTED
Candida auris: NOT DETECTED
Candida glabrata: NOT DETECTED
Candida krusei: NOT DETECTED
Candida parapsilosis: NOT DETECTED
Candida tropicalis: NOT DETECTED
Cryptococcus neoformans/gattii: NOT DETECTED
Enterobacter cloacae complex: NOT DETECTED
Enterobacterales: NOT DETECTED
Enterococcus Faecium: NOT DETECTED
Enterococcus faecalis: NOT DETECTED
Escherichia coli: NOT DETECTED
Haemophilus influenzae: NOT DETECTED
Klebsiella aerogenes: NOT DETECTED
Klebsiella oxytoca: NOT DETECTED
Klebsiella pneumoniae: NOT DETECTED
Listeria monocytogenes: DETECTED — AB
Meth resistant mecA/C and MREJ: NOT DETECTED
Methicillin resistance mecA/C: NOT DETECTED
Neisseria meningitidis: NOT DETECTED
Proteus species: NOT DETECTED
Pseudomonas aeruginosa: NOT DETECTED
Salmonella species: NOT DETECTED
Serratia marcescens: NOT DETECTED
Staphylococcus aureus (BCID): DETECTED — AB
Staphylococcus epidermidis: DETECTED — AB
Staphylococcus lugdunensis: NOT DETECTED
Staphylococcus species: DETECTED — AB
Stenotrophomonas maltophilia: NOT DETECTED
Streptococcus agalactiae: NOT DETECTED
Streptococcus pneumoniae: NOT DETECTED
Streptococcus pyogenes: NOT DETECTED
Streptococcus species: NOT DETECTED

## 2022-05-30 LAB — CBC
HCT: 32.9 % — ABNORMAL LOW (ref 36.0–46.0)
Hemoglobin: 10.8 g/dL — ABNORMAL LOW (ref 12.0–15.0)
MCH: 37.5 pg — ABNORMAL HIGH (ref 26.0–34.0)
MCHC: 32.8 g/dL (ref 30.0–36.0)
MCV: 114.2 fL — ABNORMAL HIGH (ref 80.0–100.0)
Platelets: 87 10*3/uL — ABNORMAL LOW (ref 150–400)
RBC: 2.88 MIL/uL — ABNORMAL LOW (ref 3.87–5.11)
RDW: 16.3 % — ABNORMAL HIGH (ref 11.5–15.5)
WBC: 11.4 10*3/uL — ABNORMAL HIGH (ref 4.0–10.5)
nRBC: 0 % (ref 0.0–0.2)

## 2022-05-30 LAB — GLUCOSE, CAPILLARY
Glucose-Capillary: 197 mg/dL — ABNORMAL HIGH (ref 70–99)
Glucose-Capillary: 149 mg/dL — ABNORMAL HIGH (ref 70–99)
Glucose-Capillary: 164 mg/dL — ABNORMAL HIGH (ref 70–99)

## 2022-05-30 LAB — BODY FLUID CELL COUNT WITH DIFFERENTIAL
Eos, Fluid: 0 %
Lymphs, Fluid: 13 %
Monocyte-Macrophage-Serous Fluid: 29 % — ABNORMAL LOW (ref 50–90)
Neutrophil Count, Fluid: 58 % — ABNORMAL HIGH (ref 0–25)
Total Nucleated Cell Count, Fluid: 213 cu mm (ref 0–1000)

## 2022-05-30 LAB — POCT I-STAT 7, (LYTES, BLD GAS, ICA,H+H)
Acid-base deficit: 4 mmol/L — ABNORMAL HIGH (ref 0.0–2.0)
Bicarbonate: 19.9 mmol/L — ABNORMAL LOW (ref 20.0–28.0)
Calcium, Ion: 1.22 mmol/L (ref 1.15–1.40)
HCT: 31 % — ABNORMAL LOW (ref 36.0–46.0)
Hemoglobin: 10.5 g/dL — ABNORMAL LOW (ref 12.0–15.0)
O2 Saturation: 96 %
Patient temperature: 38.6
Potassium: 3.5 mmol/L (ref 3.5–5.1)
Sodium: 138 mmol/L (ref 135–145)
TCO2: 21 mmol/L — ABNORMAL LOW (ref 22–32)
pCO2 arterial: 34.3 mmHg (ref 32–48)
pH, Arterial: 7.379 (ref 7.35–7.45)
pO2, Arterial: 90 mmHg (ref 83–108)

## 2022-05-30 LAB — RESPIRATORY PANEL BY PCR

## 2022-05-30 LAB — MAGNESIUM: Magnesium: 2 mg/dL (ref 1.7–2.4)

## 2022-05-30 LAB — PROCALCITONIN: Procalcitonin: 2.25 ng/mL

## 2022-05-30 LAB — MRSA NEXT GEN BY PCR, NASAL: MRSA by PCR Next Gen: NOT DETECTED

## 2022-05-30 LAB — PROTIME-INR
INR: 1.5 — ABNORMAL HIGH (ref 0.8–1.2)
Prothrombin Time: 17.5 seconds — ABNORMAL HIGH (ref 11.4–15.2)

## 2022-05-30 LAB — AMMONIA: Ammonia: 58 umol/L — ABNORMAL HIGH (ref 9–35)

## 2022-05-30 LAB — TRIGLYCERIDES: Triglycerides: 165 mg/dL — ABNORMAL HIGH (ref <150)

## 2022-05-30 LAB — SARS CORONAVIRUS 2 BY RT PCR: SARS Coronavirus 2 by RT PCR: NEGATIVE

## 2022-05-30 LAB — STREP PNEUMONIAE URINARY ANTIGEN: Strep Pneumo Urinary Antigen: NEGATIVE

## 2022-05-30 LAB — PROTEIN, PLEURAL OR PERITONEAL FLUID: Total protein, fluid: <3 g/dL

## 2022-05-30 LAB — LACTIC ACID, PLASMA: Lactic Acid, Venous: 5.1 mmol/L (ref 0.5–1.9)

## 2022-05-30 LAB — PHOSPHORUS: Phosphorus: 3.6 mg/dL (ref 2.5–4.6)

## 2022-05-30 LAB — ALBUMIN, PLEURAL OR PERITONEAL FLUID: Albumin, Fluid: <1.5 g/dL

## 2022-05-30 MED ORDER — AMIODARONE IV BOLUS ONLY 150 MG/100ML
150.0000 mg | Freq: Once | INTRAVENOUS | Status: AC
  Administered 2022-05-30: 150 mg via INTRAVENOUS

## 2022-05-30 MED ORDER — AMIODARONE IV BOLUS ONLY 150 MG/100ML
INTRAVENOUS | Status: AC
  Filled 2022-05-30: qty 100

## 2022-05-30 MED ORDER — ACETAMINOPHEN 325 MG PO TABS
650.0000 mg | ORAL_TABLET | Freq: Once | ORAL | Status: AC
  Administered 2022-05-30: 650 mg
  Filled 2022-05-30: qty 2

## 2022-05-30 MED ORDER — GENTAMICIN SULFATE 40 MG/ML IJ SOLN
5.0000 mg/kg/d | Freq: Three times a day (TID) | INTRAVENOUS | Status: DC
  Administered 2022-05-30 – 2022-06-01 (×7): 130 mg via INTRAVENOUS
  Filled 2022-05-30 (×9): qty 3.25

## 2022-05-30 MED ORDER — MAGNESIUM SULFATE 2 GM/50ML IV SOLN
2.0000 g | Freq: Once | INTRAVENOUS | Status: AC
  Administered 2022-05-30: 2 g via INTRAVENOUS
  Filled 2022-05-30: qty 50

## 2022-05-30 MED ORDER — FOLIC ACID 1 MG PO TABS
1.0000 mg | ORAL_TABLET | Freq: Every day | ORAL | Status: DC
  Administered 2022-05-30 – 2022-06-15 (×17): 1 mg
  Filled 2022-05-30 (×17): qty 1

## 2022-05-30 MED ORDER — POTASSIUM CHLORIDE 20 MEQ PO PACK
40.0000 meq | PACK | ORAL | Status: AC
  Administered 2022-05-30 (×2): 40 meq
  Filled 2022-05-30 (×2): qty 2

## 2022-05-30 MED ORDER — FUROSEMIDE 10 MG/ML IJ SOLN
80.0000 mg | Freq: Every day | INTRAMUSCULAR | Status: DC
  Administered 2022-05-30 – 2022-06-02 (×4): 80 mg via INTRAVENOUS
  Filled 2022-05-30 (×4): qty 8

## 2022-05-30 MED ORDER — METRONIDAZOLE 500 MG/100ML IV SOLN
500.0000 mg | Freq: Two times a day (BID) | INTRAVENOUS | Status: DC
  Administered 2022-05-30 – 2022-05-31 (×2): 500 mg via INTRAVENOUS
  Filled 2022-05-30 (×2): qty 100

## 2022-05-30 MED ORDER — SODIUM CHLORIDE 0.9 % IV SOLN
2.0000 g | Freq: Two times a day (BID) | INTRAVENOUS | Status: DC
  Administered 2022-05-30 – 2022-06-02 (×6): 2 g via INTRAVENOUS
  Filled 2022-05-30 (×6): qty 20

## 2022-05-30 MED ORDER — VITAL AF 1.2 CAL PO LIQD
1000.0000 mL | ORAL | Status: DC
  Administered 2022-05-30: 1000 mL

## 2022-05-30 MED ORDER — AMIODARONE HCL IN DEXTROSE 360-4.14 MG/200ML-% IV SOLN
30.0000 mg/h | INTRAVENOUS | Status: DC
  Administered 2022-05-31 – 2022-06-02 (×7): 30 mg/h via INTRAVENOUS
  Filled 2022-05-30 (×9): qty 200

## 2022-05-30 MED ORDER — THIAMINE MONONITRATE 100 MG PO TABS
100.0000 mg | ORAL_TABLET | Freq: Every day | ORAL | Status: DC
  Administered 2022-05-30 – 2022-06-15 (×17): 100 mg
  Filled 2022-05-30 (×17): qty 1

## 2022-05-30 MED ORDER — ACETAMINOPHEN 325 MG PO TABS
650.0000 mg | ORAL_TABLET | Freq: Three times a day (TID) | ORAL | Status: DC | PRN
  Administered 2022-05-30 – 2022-06-14 (×12): 650 mg
  Filled 2022-05-30 (×12): qty 2

## 2022-05-30 MED ORDER — AMIODARONE HCL IN DEXTROSE 360-4.14 MG/200ML-% IV SOLN
60.0000 mg/h | INTRAVENOUS | Status: AC
  Administered 2022-05-30: 60 mg/h via INTRAVENOUS

## 2022-05-30 MED ORDER — ALBUMIN HUMAN 25 % IV SOLN
25.0000 g | Freq: Four times a day (QID) | INTRAVENOUS | Status: AC
  Administered 2022-05-30 – 2022-05-31 (×4): 25 g via INTRAVENOUS
  Filled 2022-05-30 (×4): qty 100

## 2022-05-30 MED ORDER — SODIUM CHLORIDE 0.9 % IV SOLN: 2.0000 g | INTRAVENOUS | Status: DC

## 2022-05-30 MED ORDER — SODIUM CHLORIDE 0.9 % IV SOLN
2.0000 g | INTRAVENOUS | Status: DC
  Administered 2022-05-30 – 2022-06-16 (×100): 2 g via INTRAVENOUS
  Filled 2022-05-30 (×112): qty 2000

## 2022-05-30 NOTE — Plan of Care (Signed)

## 2022-05-30 NOTE — Progress Notes (Signed)
Initial Nutrition Assessment  DOCUMENTATION CODES:  Not applicable  INTERVENTION:  Initiate tube feeding via NGT: Start at trickle of 73mL/h. Advance as tolerated per CCM Goal: Vital AF at 50 ml/h (1200 ml per day) Provides 1440 kcal, 90 gm protein, 973 ml free water daily TF+propofol = [id number] Thiamine and folic acid daily for hx of EtOH abuse  NUTRITION DIAGNOSIS:  Inadequate oral intake related to inability to eat as evidenced by NPO status.  GOAL:  Patient will meet greater than or equal to 90% of their needs  MONITOR:  Vent status, Labs, TF tolerance  REASON FOR ASSESSMENT:  Ventilator    ASSESSMENT:   Pt with hx of EtOH abuse with cirrhosis presented to ED after being found unresponsive.   Noted that pt recently admitted at [facility] 10/1-10/6 and 10/19-10/21. Followed by RD and received diet education during earlier admission.    Patient is currently intubated on ventilator support. Family at bedside report that pt has been doing well at home since last admission. Has had a good appetite and feels that some weight has been lost due to management of fluid. Pitting edema noted to the extremities at this time.   Discussed with CCM NP, ok to initiate trickle today via OGT as pt is not going to be extubated. Discussed with RN. Propofol currently providing a significant source of kcal, will adjust rate of TF as propofol is able to be weaned.   MV: 8.4 L/min Temp (24hrs), Avg:101.4 F (38.6 C), Min:100.4 F (38 C), Max:103.3 F (39.6 C)  Propofol: 24.2 ml/hr (639 kcal/d)   Intake/Output Summary (Last 24 hours) at 05/30/2022 1516 Last data filed at 05/30/2022 1400 Gross per 24 hour  Intake 9685.23 ml  Output 3300 ml  Net 6385.23 ml  Net IO Since Admission: 6,385.23 mL [05/30/22 1516]   Nutritionally Relevant Medications: Scheduled Meds:  docusate  100 mg Per Tube BID   furosemide  80 mg Intravenous Daily   lactulose  30 g Per Tube TID   polyethylene glycol   17 g Per Tube Daily   Continuous Infusions:  albumin human 60 mL/hr at 05/30/22 1004   lactated ringers 150 mL/hr at 05/30/22 1000   propofol (DIPRIVAN) infusion 40 mcg/kg/min (05/30/22 1000)   vancomycin     PRN Meds: docusate, polyethylene glycol  Labs Reviewed: CBG ranges from 167-197 mg/dL over the last 24 hours Ammonia 58 Lab Results  Component Value Date   ALT 52 (H) 05/30/2022   AST 90 (H) 05/30/2022   ALKPHOS 93 05/30/2022   BILITOT 5.0 (H) 05/30/2022    NUTRITION - FOCUSED PHYSICAL EXAM: Flowsheet Row Most Recent Value  Orbital Region No depletion  Upper Arm Region No depletion  Thoracic and Lumbar Region No depletion  Buccal Region No depletion  Temple Region No depletion  Clavicle Bone Region No depletion  Clavicle and Acromion Bone Region No depletion  Scapular Bone Region No depletion  Dorsal Hand No depletion  Patellar Region No depletion  Anterior Thigh Region No depletion  Posterior Calf Region No depletion  Edema (RD Assessment) Moderate  [pitting to BLE and BUE]  Hair Reviewed  Eyes Reviewed  Mouth Reviewed  Skin Reviewed  Nails Reviewed   Diet Order:   Diet Order             Diet NPO time specified  Diet effective now                   EDUCATION NEEDS:  Not appropriate for education at this time  Skin:  Skin Assessment: Reviewed RN Assessment  Last BM:  unsure  Height:  Ht Readings from Last 1 Encounters:  05/10/22 5\' 4"  (1.626 m)    Weight:  Wt Readings from Last 1 Encounters:  05/30/22 77.1 kg    Ideal Body Weight:  54.5 kg  BMI:  Body mass index is 29.18 kg/m.  Estimated Nutritional Needs:  Kcal:  1800-2000 kcal/d Protein:  90-110 g/d Fluid:  2L/d    Ranell Patrick, RD, LDN Clinical Dietitian RD pager # available in [facility]  After hours/weekend pager # available in [facility]

## 2022-05-30 NOTE — Progress Notes (Signed)
NAME:  Deanna Mcmillan, MRN:  [id number], DOB:  [birth year], LOS: 1 ADMISSION DATE:  06/15/2022, CONSULTATION DATE:  11/7 REFERRING MD:  Lendon Colonel, EDP CHIEF COMPLAINT:  AMS   History of Present Illness:  51 yo woman with hx of recent diagnosis of alcoholic hepatitis (dx 28/36), here with ams, fever.   History from [name].  Patient was in her normal state of health, until suddenly last night she developed a headache.  She went to sleep to rest.  Today she slept throughout the day, this evening her mother went to wake her and was unable to wake her up, so she called 911.     She has been doing well since her recent hospital discharge, taking all the meds as prescribed, no new meds.  Planned follow up with GI on 11/14.  LE edema had been improving.  No cough, sob no focal symptoms.  No known sick contacts.     In ED, febrile, tachycardic, lethargic, desaturating to 70s.   Intubated by ED physician.    Had been admitted earlier in October and started on course of prenisone for alc hepatitis.    Recently seen at [facility] 10/19: LE Edema. Lasix not helpful at home.  Admitted for diuresis.  Jaundice and abd distension also noted.  Pleural effusion.  GI saw: non enough ascites for tap  Started lasix and spironolactone.  28 day course of prednisolone, miralax, lactulose, miralax. Midodrine TID    In ED got cefepime, LR 1L, flagyl ordered, intubated, started on Propofol, vanc ordered   Pertinent  Medical History  ETOH Cirrhosis BLE edema recently worsening  S/p lap appy 2014    Echo: recent normal [id number]/9476   Home meds: folic ascid, acetaminohpen, lasix 40bid, lactulose 30 TID protonix, prednisone (04/29/22), prednisolone 04/28/22 three week course, thiamine, simethicone,    Per her mother she had been drinking at least one bottle of wine daily, she isnt clear exactly how much she was drinking but she did also drink [place].  She notes she had wanted to stop recently but been unable to.   She thinks she was depressed after experiencing perimenopause.  No tobacco.   Significant Hospital Events: Including procedures, antibiotic start and stop dates in addition to other pertinent events   11/7: Intubated in ED and admitted to ICU 7/7: CTH neg, CT A/P cirrhosis with portal hypertension, splenomegaly, moderate volume ascites, moderate bilateral pleural effusion   Interim History / Subjective:  Patient remains sedated on propofol and fentanyl Mother updated at the bedside Still having fevers 100-101.  Objective   Blood pressure 115/62, pulse (!) 108, temperature (!) 100.6 F (38.1 C), resp. rate (!) 22, weight 77.1 kg, SpO2 98 %.    Vent Mode: PRVC FiO2 (%):  [40 %-100 %] 40 % Set Rate:  [18 bmp-20 bmp] 20 bmp Vt Set:  [420 mL] 420 mL PEEP:  [5 cmH20] 5 cmH20 Plateau Pressure:  [17 cmH20-22 cmH20] 17 cmH20   Intake/Output Summary (Last 24 hours) at 05/30/2022 0917 Last data filed at 05/30/2022 0600 Gross per 24 hour  Intake 7841.83 ml  Output 835 ml  Net 7006.83 ml   Filed Weights   05/26/2022 2000 06/05/2022 2215 05/30/22 0145  Weight: 101 kg 77.1 kg 77.1 kg    Examination: General: Acutely ill middle-aged woman laying in bed.  NAD. HENT: [place]/AT.  ET tube in place. Scleral icterus. Slight conjunctival edema. Lungs: Mechanical lung sounds.  Decreased breath sounds anteriorly.  Crackles at  the bases. Cardiovascular: Tachycardic.  Regular rhythm.  2+ BLE pitting edema. Abdomen: Mildly distended. No spider angiomata. No significant fluid wave. Extremities: Well perfused.  Warm and dry. Neuro: Sedated.  RASS -4.  GU: Foley in place  PT/INR 1.5/17.5, mag 2.0, Phos 3.6, ammonia downtrending to 58 Sodium 140, K+ 4.0, bicarb 19, sCr 0.79, bilirubin 5.0, AST/ALT trending down. ABG: 7.37/34.3/90/19.9  Resolved Hospital Problem list    Assessment & Plan:   Severe sepsis Acute metabolic encephalopathy Patient with undifferentiated sepsis intubated in the ER due to AMS  and inability to protect airway.  CT head negative.  Remains on full vent support.  BP has been stable without pressors. Sputum culture with rare gram- rods -Continue cefepime and Vanc  -F/u Bcx -Wean sedation as able -Check RVP -Trend CBC/fever curve    Acute hypoxic respiratory failure CAP versus aspiration pneumonia Compressive atelectasis Patient remains on full vent support.  Elevated Pro-Cal on admission.  Imaging with opacities concerning for possible pneumonia as well as moderate bilateral pleural effusion. -Continue full vent support -SBT when able -Continue antibiotics as above -Check RVP  Alcoholic cirrhosis Hepatic encephalopathy? Recently diagnosed cirrhosis during last hospitalization.  Concern for possible hepatic encephalopathy and possible SBP. MELD Na score of 17. Child Pugh Class C. FIB-4 score of 7.46 indicates advance fibrosis -Diagnostic Paracentesis, f/u labs -Continue abx -IV lasix and albumin -Cont thiamine, folate., MVI -Continue lactulose, goal 2-3 BM  Best Practice (right click and "Reselect all SmartList Selections" daily)   Diet/type: NPO DVT prophylaxis: prophylactic heparin  GI prophylaxis: PPI Lines: N/A Foley:  Yes, and it is still needed Code Status:  full code Last date of multidisciplinary goals of care discussion [updated mother at bedside]  Labs   CBC: Recent Labs  Lab 06/15/2022 1950 05/25/2022 2028 05/30/22 0040 05/30/22 0453  WBC 12.5*  --  11.4*  --   NEUTROABS 11.2*  --   --   --   HGB 11.8* 10.9* 10.8* 10.5*  HCT 34.3* 32.0* 32.9* 31.0*  MCV 110.6*  --  114.2*  --   PLT 111*  --  87*  --     Basic Metabolic Panel: Recent Labs  Lab 06/02/2022 2028 06/10/2022 2138 05/30/22 0040 05/30/22 0453  NA 139 140 140 138  K 3.9 4.0 4.0 3.5  CL  --  107 108  --   CO2  --  20* 19*  --   GLUCOSE  --  185* 183*  --   BUN  --  19 18  --   CREATININE  --  0.91 0.79  --   CALCIUM  --  8.8* 8.7*  --   MG  --   --  2.0  --   PHOS  --    --  3.6  --    GFR: Estimated Creatinine Clearance: 82.7 mL/min (by C-G formula based on SCr of 0.79 mg/dL). Recent Labs  Lab 05/24/2022 1950 06/16/2022 2138 05/30/22 0040  PROCALCITON  --  1.31 2.25  WBC 12.5*  --  11.4*  LATICACIDVEN 2.9* 5.0* 5.1*    Liver Function Tests: Recent Labs  Lab 06/21/2022 2138 05/30/22 0040  AST 101* 90*  ALT 52* 52*  ALKPHOS 100 93  BILITOT 5.0* 5.0*  PROT 5.7* 5.6*  ALBUMIN 2.3* 2.3*   No results for input(s): "LIPASE", "AMYLASE" in the last 168 hours. Recent Labs  Lab 06/04/2022 1937 05/30/22 0040  AMMONIA 91* 58*    ABG    Component Value  Date/Time   PHART 7.379 05/30/2022 0453   PCO2ART 34.3 05/30/2022 0453   PO2ART 90 05/30/2022 0453   HCO3 19.9 (L) 05/30/2022 0453   TCO2 21 (L) 05/30/2022 0453   ACIDBASEDEF 4.0 (H) 05/30/2022 0453   O2SAT 96 05/30/2022 0453     Coagulation Profile: Recent Labs  Lab 05/30/2022 1950 05/30/22 0040  INR 1.4* 1.5*    Cardiac Enzymes: No results for input(s): "CKTOTAL", "CKMB", "CKMBINDEX", "TROPONINI" in the last 168 hours.  HbA1C: Hgb A1c MFr Bld  Date/Time Value Ref Range Status  04/25/2022 03:41 AM <4.2 (L) 4.8 - 5.6 % Final    Comment:    (NOTE) **Verified by repeat analysis**         Prediabetes: 5.7 - 6.4         Diabetes: >6.4         Glycemic control for adults with diabetes: <7.0     CBG: Recent Labs  Lab 06/18/2022 2213 06/05/2022 2322 05/30/22 0326  GLUCAP 176* 167* 197*    Review of Systems:   As in HPI  Past Medical History:  She,  has no past medical history on file.   Surgical History:   Past Surgical History:  Procedure Laterality Date   KNEE ARTHROSCOPY W/ ACL RECONSTRUCTION Left 1994/5   Dr. Weston Anna   LAPAROSCOPIC APPENDECTOMY N/A 04/11/2013   Procedure: APPENDECTOMY LAPAROSCOPIC;  Surgeon: Stark Klein, MD;  Location: [place];  Service: General;  Laterality: N/A;   [place]   3 extractions     Social History:   reports that  she has never smoked. She does not have any smokeless tobacco history on file. She reports current alcohol use. She reports that she does not use drugs.   Family History:  Her family history is not on file.   Allergies No Known Allergies   Home Medications  Prior to Admission medications   Medication Sig Start Date End Date Taking? Authorizing Provider  acetaminophen (TYLENOL) 325 MG tablet Take 650 mg by mouth every 6 (six) hours as needed for headache or moderate pain.   Yes [provider]  folic acid (FOLVITE) 1 MG tablet Take 1 tablet (1 mg total) by mouth daily. 04/28/22  Yes Hosie Poisson, MD  furosemide (LASIX) 40 MG tablet Take 1 tablet (40 mg total) by mouth daily. 05/12/22 05/12/23 Yes Sheikh, Omair Latif, DO  ibuprofen (ADVIL) 200 MG tablet Take 400 mg by mouth every 6 (six) hours as needed for headache or moderate pain.   Yes [provider]  KLOR-CON M20 20 MEQ tablet Take 20 mEq by mouth daily. 05/14/22  Yes [provider]  lactulose (CHRONULAC) 10 GM/15ML solution Take 30 mLs (20 g total) by mouth 3 (three) times daily. 04/27/22  Yes Hosie Poisson, MD  Multiple Vitamin (MULTIVITAMIN WITH MINERALS) TABS tablet Take 1 tablet by mouth daily. 04/28/22  Yes Hosie Poisson, MD  pantoprazole (PROTONIX) 40 MG tablet Take 1 tablet (40 mg total) by mouth daily. 04/27/22 04/27/23 Yes Hosie Poisson, MD  polyethylene glycol (MIRALAX / GLYCOLAX) 17 g packet Take 17 g by mouth daily as needed for mild constipation. 05/12/22  Yes Sheikh, Omair Latif, DO  prednisoLONE 5 MG TABS tablet Take 40 mg by mouth daily. 05/14/22  Yes [provider]  predniSONE (DELTASONE) 20 MG tablet Take 40 mg by mouth daily. 04/29/22  Yes [provider]  simethicone (MYLICON) 80 MG chewable tablet Chew 1 tablet (80 mg total) by mouth  every 6 (six) hours as needed for flatulence. 04/27/22  Yes Hosie Poisson, MD  spironolactone (ALDACTONE) 50 MG tablet Take 1 tablet (50 mg total)  by mouth daily. 05/13/22  Yes Sheikh, Omair Latif, DO  thiamine (VITAMIN B-1) 100 MG tablet Take 1 tablet (100 mg total) by mouth daily. 04/28/22  Yes Hosie Poisson, MD     Critical care time: 22

## 2022-05-30 NOTE — Progress Notes (Signed)
Pharmacy Antibiotic Note  Deanna Mcmillan is a 51 y.o. female admitted on 06/08/2022 with bacteremia/possible concern for meningitis/sepsis/SBP. Blood cultures from 11/7 showing MSSA, MSSE and listeria in 1/2 sets. ID now adding listeria coverage with ampicillin/gentamicin. Scr- 1.22 with CrCl ~ 83 ml/min.     Plan: Continue Vancomycin 1250 mg every 24 hours  (Predicted AUC 522 with Scr 1.22 and Vd 0.72)  Ampicillin 2 gm every 4 hours added on for listeria  Gentamicin 130 mg q 8 hours ( ~ 5 mg/kg/day in 3 divided doses)  for listeria Ceftriaxone 2 g IV Q 12 hours  Monitor renal function /cultures/clinical status   Weight: 77.1 kg (169 lb 15.6 oz)  Temp (24hrs), Avg:101.4 F (38.6 C), Min:100.4 F (38 C), Max:103.3 F (39.6 C)  Recent Labs  Lab 06/05/2022 1950 05/26/2022 2138 05/30/22 0040  WBC 12.5*  --  11.4*  CREATININE  --  0.91 0.79  LATICACIDVEN 2.9* 5.0* 5.1*     Estimated Creatinine Clearance: 82.7 mL/min (by C-G formula based on SCr of 0.79 mg/dL).    No Known Allergies  Antimicrobials this admission: vancomycin 11/7 >>  Cefepime 11/7 >> 11/8 Norva Karvonen 11/8> Ampicillin 11/8> Ceftriaxone 11/8 >  Dose adjustments this admission: N/A  Microbiology results: 11/7 BCx: 1/4 MSSA/MSSE/Listeria  11/7 RVP: in process  Thank you for allowing pharmacy to be a part of this patient's care.  Jimmy Footman, PharmD, BCPS, BCIDP Infectious Diseases Clinical Pharmacist Phone: [phone] 05/30/2022 1:43 PM

## 2022-05-30 NOTE — Procedures (Signed)
Central Venous Catheter Insertion Procedure Note  ZORAYA FIORENZA  [id number]  22-Mar-1971  Date:05/30/22  Time:5:27 PM   Provider Performing:Lucille Witts Jerilynn Mages Coy Saunas   Procedure: Insertion of Non-tunneled Central Venous [phone]) with US guidance (01040)   Indication(s) Medication administration  Consent Risks of the procedure as well as the alternatives and risks of each were explained to the patient and/or caregiver.  Consent for the procedure was obtained and is signed in the bedside chart  Anesthesia Topical only with 1% lidocaine   Timeout Verified patient identification, verified procedure, site/side was marked, verified correct patient position, special equipment/implants available, medications/allergies/relevant history reviewed, required imaging and test results available.  Sterile Technique Maximal sterile technique including full sterile barrier drape, hand hygiene, sterile gown, sterile gloves, mask, hair covering, sterile ultrasound probe cover (if used).  Procedure Description Area of catheter insertion was cleaned with chlorhexidine and draped in sterile fashion.  With real-time ultrasound guidance a central venous catheter was placed into the right internal jugular vein. Nonpulsatile blood flow and easy flushing noted in all ports.  The catheter was sutured in place and sterile dressing applied.  Complications/Tolerance None; patient tolerated the procedure well. Chest X-ray is ordered to verify placement for internal jugular or subclavian cannulation.   Chest x-ray is not ordered for femoral cannulation.  EBL Minimal  Specimen(s) None

## 2022-05-30 NOTE — Progress Notes (Signed)
PHARMACY - PHYSICIAN COMMUNICATION CRITICAL VALUE ALERT - BLOOD CULTURE IDENTIFICATION (BCID)  Deanna Mcmillan is an 51 y.o. female who presented to [facility] on 06/19/2022 with a chief complaint of altered mental status  Assessment:  One positive blood culture so far - anaerobic with GPC in clusters and GPR.  BCID showing staph epi, staph aureus and listeria monocytogenes.    Name of physician (or Provider) Contacted: Dr. Coy Saunas and ID consult team  Current antibiotics: vancomycin and cefepime  Changes to prescribed antibiotics recommended: add ampicillin 2g IV q4h Recommendations accepted by provider  Results for orders placed or performed during the hospital encounter of 05/27/2022  Blood Culture ID Panel (Reflexed) (Collected: 05/24/2022  7:37 PM)  Result Value Ref Range   Enterococcus faecalis NOT DETECTED NOT DETECTED   Enterococcus Faecium NOT DETECTED NOT DETECTED   Listeria monocytogenes DETECTED (A) NOT DETECTED   Staphylococcus species DETECTED (A) NOT DETECTED   Staphylococcus aureus (BCID) DETECTED (A) NOT DETECTED   Staphylococcus epidermidis DETECTED (A) NOT DETECTED   Staphylococcus lugdunensis NOT DETECTED NOT DETECTED   Streptococcus species NOT DETECTED NOT DETECTED   Streptococcus agalactiae NOT DETECTED NOT DETECTED   Streptococcus pneumoniae NOT DETECTED NOT DETECTED   Streptococcus pyogenes NOT DETECTED NOT DETECTED   A.calcoaceticus-baumannii NOT DETECTED NOT DETECTED   Bacteroides fragilis NOT DETECTED NOT DETECTED   Enterobacterales NOT DETECTED NOT DETECTED   Enterobacter cloacae complex NOT DETECTED NOT DETECTED   Escherichia coli NOT DETECTED NOT DETECTED   Klebsiella aerogenes NOT DETECTED NOT DETECTED   Klebsiella oxytoca NOT DETECTED NOT DETECTED   Klebsiella pneumoniae NOT DETECTED NOT DETECTED   Proteus species NOT DETECTED NOT DETECTED   Salmonella species NOT DETECTED NOT DETECTED   Serratia marcescens NOT DETECTED NOT DETECTED   Haemophilus  influenzae NOT DETECTED NOT DETECTED   Neisseria meningitidis NOT DETECTED NOT DETECTED   Pseudomonas aeruginosa NOT DETECTED NOT DETECTED   Stenotrophomonas maltophilia NOT DETECTED NOT DETECTED   Candida albicans NOT DETECTED NOT DETECTED   Candida auris NOT DETECTED NOT DETECTED   Candida glabrata NOT DETECTED NOT DETECTED   Candida krusei NOT DETECTED NOT DETECTED   Candida parapsilosis NOT DETECTED NOT DETECTED   Candida tropicalis NOT DETECTED NOT DETECTED   Cryptococcus neoformans/gattii NOT DETECTED NOT DETECTED   Methicillin resistance mecA/C NOT DETECTED NOT DETECTED   Meth resistant mecA/C and MREJ NOT DETECTED NOT DETECTED    Candie Mile 05/30/2022  12:43 PM

## 2022-05-30 NOTE — Consult Note (Addendum)
[facility] for Infectious Disease    Date of Admission:  05/27/2022   Total days of inpatient antibiotics 2        Reason for Consult: Polymicrobial bacteremia    Principal Problem:   Acute respiratory failure with hypoxia [facility])   Assessment: 51 YF admitted with:   #Listeria monocytogenes, MSSA bacteremia #Fever #C/F meningitis - Blood cultures from 11/7 showing 1/2 staph epi, MSSA, Listeria monocytogenes.  We will do double coverage with ampicillin gentamicin administered. - Per mother at bedside patient had not been feeling well for last few weeks.  On Monday she had abdominal pain throughout the day followed by headache at night but states she was unarousable and presented to the ED.  She was intubated. - I think she would benefit from a LP given Listeria bacteremia and her history of headache along with fevers.  Unclear how long patient has been bacteremia, mother reports patient had not been feeling well since being hospitalized for alcoholic hepatitis 16/1 - 10/6.  She was discharged on steroids 40 mg daily for 28 days she was again hospitalized 10/20 - 10/21 for bilateral lower extremity edema suspected secondary to third spacing, discharged on midodrine. It is possible patient developed SBP then became bacteremic along with meningitis versus she had bacteremia due to multiple healthcare exposures and underlying cirrhosis developing SBP and bacteremia.  Cultures and studies are pending.  #Alcoholic cirrhosis with ascites SP paracentesis on 11/8 -50 cc of cloudy-yellowish fluid drained, Cx pending     Recommendations:  -D/C cefepime -Start ampicillin and gentamicin for Listeria bacteremia -Start CTX for SBP(meningitis dosing) -Recommend LP with bacterial, AFB, fungal Cx -Follow blood Cx, OK to continue metronidazole and vanc for 48 hrs form positive Cx as they are polymicrobial  -Repeat blood Cx -Follow peritoneal Cx -TTE   I have personally spent 85  minutes involved in face-to-face and non-face-to-face activities for this patient on the day of the visit. Professional time spent includes the following activities: Preparing to see the patient (review of tests), Obtaining and/or reviewing separately obtained history (admission/discharge record), Performing a medically appropriate examination and/or evaluation , Ordering medications/tests/procedures, referring and communicating with other health care professionals, Documenting clinical information in the EMR, Independently interpreting results (not separately reported), Communicating results to the patient/family/caregiver, Counseling and educating the patient/family/caregiver and Care coordination (not separately reported).   Microbiology:   Antibiotics: Vancomycin 11/7- Metronidazole 11/17- Cefepime 11/17-   Cultures: Blood 11/7 1/2 MSSA, staph epi, listeria monocytogenes  11/8 Resp Cx pending, RVP negative   HPI: Deanna Mcmillan is a 51 y.o. female with alcoholic hepatitis diagnosed 10/23 presented with altered mental status and fevers.  She was in her normal state of health until Monday night when she developed a headache.  Mother states that patient has had abdominal pain and that her stomach felt hard for the duration on Monday as well.  Next day she patient was unarousable and nonmovement called.  On arrival to the ED patient was tachycardic, desats in the 70s, lethargic.  She was started on broad-spectrum antibiotics, diuretics intubated.  ID engaged as blood cultures grew MSSA, staph epi and Listeria monocytogenes.   Review of Systems: Review of Systems  Unable to perform ROS: Acuity of condition    No past medical history on file.  Social History   Tobacco Use   Smoking status: Never  Substance Use Topics   Alcohol use: Yes    Comment: daily one  glass of wine   Drug use: No    No family history on file. Scheduled Meds:  Chlorhexidine Gluconate Cloth  6 each Topical  Q0600   docusate  100 mg Per Tube BID   famotidine  20 mg Per Tube BID   fentaNYL (SUBLIMAZE) injection  50 mcg Intravenous Once   furosemide  80 mg Intravenous Daily   heparin  5,000 Units Subcutaneous Q8H   lactulose  30 g Per Tube TID   mouth rinse  15 mL Mouth Rinse Q2H   polyethylene glycol  17 g Per Tube Daily   potassium chloride  40 mEq Per Tube Q2H   Continuous Infusions:  albumin human Stopped (05/30/22 1238)   ampicillin (OMNIPEN) IV Stopped (05/30/22 1358)   cefTRIAXone (ROCEPHIN)  IV     feeding supplement (VITAL AF 1.2 CAL)     fentaNYL infusion INTRAVENOUS 50 mcg/hr (05/30/22 1400)   gentamicin     lactated ringers 150 mL/hr at 05/30/22 1400   magnesium sulfate bolus IVPB 2 g (05/30/22 1435)   propofol (DIPRIVAN) infusion 20 mcg/kg/min (05/30/22 1400)   vancomycin     PRN Meds:.docusate, fentaNYL, mouth rinse, polyethylene glycol No Known Allergies  OBJECTIVE: Blood pressure (!) 121/59, pulse (!) 109, temperature (!) 100.9 F (38.3 C), resp. rate (!) 21, weight 77.1 kg, SpO2 98 %.  Physical Exam Constitutional:      Comments: Intubated and sedated   HENT:     Head: Normocephalic and atraumatic.     Right Ear: Tympanic membrane normal.     Left Ear: Tympanic membrane normal.     Nose: Nose normal.     Mouth/Throat:     Mouth: Mucous membranes are moist.  Eyes:     Extraocular Movements: Extraocular movements intact.     Conjunctiva/sclera: Conjunctivae normal.     Pupils: Pupils are equal, round, and reactive to light.  Cardiovascular:     Rate and Rhythm: Normal rate and regular rhythm.     Heart sounds: No murmur heard.    No friction rub. No gallop.  Pulmonary:     Effort: Pulmonary effort is normal.     Breath sounds: Normal breath sounds.  Abdominal:     General: There is distension (slight).  Musculoskeletal:        General: Normal range of motion.  Skin:    General: Skin is warm and dry.     Lab Results Lab Results  Component Value  Date   WBC 11.4 (H) 05/30/2022   HGB 10.5 (L) 05/30/2022   HCT 31.0 (L) 05/30/2022   MCV 114.2 (H) 05/30/2022   PLT 87 (L) 05/30/2022    Lab Results  Component Value Date   CREATININE 0.79 05/30/2022   BUN 18 05/30/2022   NA 138 05/30/2022   K 3.5 05/30/2022   CL 108 05/30/2022   CO2 19 (L) 05/30/2022    Lab Results  Component Value Date   ALT 52 (H) 05/30/2022   AST 90 (H) 05/30/2022   ALKPHOS 93 05/30/2022   BILITOT 5.0 (H) 05/30/2022       Laurice Record, [place] for Infectious [facility] 05/30/2022, 2:46 PM

## 2022-05-30 NOTE — Procedures (Signed)
Paracentesis Procedure Note  Deanna Mcmillan  [id number]  14-Aug-1970  Date:05/30/22  Time:12:45 PM   Provider Performing:Amalea Ottey Jerilynn Mages Coy Saunas    Procedure: Paracentesis with imaging guidance (38453)  Indication(s) Ascites  Consent Risks of the procedure as well as the alternatives and risks of each were explained to the patient and/or caregiver.  Consent for the procedure was obtained and is signed in the bedside chart  Anesthesia Topical only with 1% lidocaine    Time Out Verified patient identification, verified procedure, site/side was marked, verified correct patient position, special equipment/implants available, medications/allergies/relevant history reviewed, required imaging and test results available.   Sterile Technique Maximal sterile technique including full sterile barrier drape, hand hygiene, sterile gown, sterile gloves, mask, hair covering, sterile ultrasound probe cover (if used).   Procedure Description Ultrasound used to identify appropriate peritoneal anatomy for placement and overlying skin marked.  Area of drainage cleaned and draped in sterile fashion. Lidocaine was used to anesthetize the skin and subcutaneous tissue.  50 cc's of cloudy-yellowish appearing fluid was drained. Catheter then removed and bandaid applied to site.   Complications/Tolerance None; patient tolerated the procedure well.   EBL Minimal   Specimen(s) Peritoneal fluid

## 2022-05-31 ENCOUNTER — Inpatient Hospital Stay (HOSPITAL_COMMUNITY): Payer: 59

## 2022-05-31 DIAGNOSIS — R7881 Bacteremia: Secondary | ICD-10-CM | POA: Diagnosis not present

## 2022-05-31 DIAGNOSIS — J9601 Acute respiratory failure with hypoxia: Secondary | ICD-10-CM | POA: Diagnosis not present

## 2022-05-31 LAB — ECHOCARDIOGRAM COMPLETE BUBBLE STUDY
AR max vel: 2.98 cm2
AV Area VTI: 2.86 cm2
AV Area mean vel: 2.54 cm2
AV Mean grad: 10 mmHg
AV Peak grad: 16 mmHg
Ao pk vel: 2 m/s
Area-P 1/2: 5.31 cm2
S' Lateral: 3.5 cm

## 2022-05-31 LAB — CBC WITH DIFFERENTIAL/PLATELET
Abs Immature Granulocytes: 0.13 10*3/uL — ABNORMAL HIGH (ref 0.00–0.07)
Basophils Absolute: 0 10*3/uL (ref 0.0–0.1)
Basophils Relative: 0 %
Eosinophils Absolute: 0 10*3/uL (ref 0.0–0.5)
Eosinophils Relative: 0 %
HCT: 28.1 % — ABNORMAL LOW (ref 36.0–46.0)
Hemoglobin: 9.8 g/dL — ABNORMAL LOW (ref 12.0–15.0)
Immature Granulocytes: 1 %
Lymphocytes Relative: 7 %
Lymphs Abs: 0.9 10*3/uL (ref 0.7–4.0)
MCH: 38.3 pg — ABNORMAL HIGH (ref 26.0–34.0)
MCHC: 34.9 g/dL (ref 30.0–36.0)
MCV: 109.8 fL — ABNORMAL HIGH (ref 80.0–100.0)
Monocytes Absolute: 0.7 10*3/uL (ref 0.1–1.0)
Monocytes Relative: 5 %
Neutro Abs: 12.3 10*3/uL — ABNORMAL HIGH (ref 1.7–7.7)
Neutrophils Relative %: 87 %
Platelets: 87 10*3/uL — ABNORMAL LOW (ref 150–400)
RBC: 2.56 MIL/uL — ABNORMAL LOW (ref 3.87–5.11)
RDW: 16 % — ABNORMAL HIGH (ref 11.5–15.5)
WBC: 14 10*3/uL — ABNORMAL HIGH (ref 4.0–10.5)
nRBC: 0 % (ref 0.0–0.2)

## 2022-05-31 LAB — URINE CULTURE: Culture: >=100000 — AB

## 2022-05-31 LAB — GLUCOSE, CAPILLARY
Glucose-Capillary: 154 mg/dL — ABNORMAL HIGH (ref 70–99)
Glucose-Capillary: 159 mg/dL — ABNORMAL HIGH (ref 70–99)
Glucose-Capillary: 159 mg/dL — ABNORMAL HIGH (ref 70–99)
Glucose-Capillary: 157 mg/dL — ABNORMAL HIGH (ref 70–99)
Glucose-Capillary: 129 mg/dL — ABNORMAL HIGH (ref 70–99)
Glucose-Capillary: 159 mg/dL — ABNORMAL HIGH (ref 70–99)
Glucose-Capillary: 165 mg/dL — ABNORMAL HIGH (ref 70–99)

## 2022-05-31 LAB — BASIC METABOLIC PANEL
Anion gap: 8 (ref 5–15)
BUN: 15 mg/dL (ref 6–20)
CO2: 22 mmol/L (ref 22–32)
Calcium: 8.7 mg/dL — ABNORMAL LOW (ref 8.9–10.3)
Chloride: 111 mmol/L (ref 98–111)
Creatinine, Ser: 0.62 mg/dL (ref 0.44–1.00)
GFR, Estimated: >60 mL/min (ref >60)
Glucose, Bld: 173 mg/dL — ABNORMAL HIGH (ref 70–99)
Potassium: 3.2 mmol/L — ABNORMAL LOW (ref 3.5–5.1)
Sodium: 141 mmol/L (ref 135–145)

## 2022-05-31 LAB — PHOSPHORUS: Phosphorus: 1.7 mg/dL — ABNORMAL LOW (ref 2.5–4.6)

## 2022-05-31 LAB — MAGNESIUM: Magnesium: 2.4 mg/dL (ref 1.7–2.4)

## 2022-05-31 LAB — LACTIC ACID, PLASMA: Lactic Acid, Venous: 2.2 mmol/L (ref 0.5–1.9)

## 2022-05-31 LAB — PROCALCITONIN: Procalcitonin: 2.93 ng/mL

## 2022-05-31 MED ORDER — PERFLUTREN LIPID MICROSPHERE
1.0000 mL | INTRAVENOUS | Status: AC | PRN
  Administered 2022-05-31: 2 mL via INTRAVENOUS

## 2022-05-31 MED ORDER — MAGNESIUM SULFATE 2 GM/50ML IV SOLN
2.0000 g | Freq: Once | INTRAVENOUS | Status: DC
  Administered 2022-05-31: 2 g via INTRAVENOUS
  Filled 2022-05-31: qty 50

## 2022-05-31 MED ORDER — POTASSIUM CHLORIDE 20 MEQ PO PACK
20.0000 meq | PACK | ORAL | Status: AC
  Administered 2022-05-31 (×2): 20 meq
  Filled 2022-05-31 (×2): qty 1

## 2022-05-31 MED ORDER — POTASSIUM CHLORIDE 10 MEQ/50ML IV SOLN
10.0000 meq | INTRAVENOUS | Status: AC
  Administered 2022-05-31 (×4): 10 meq via INTRAVENOUS
  Filled 2022-05-31 (×4): qty 50

## 2022-05-31 MED ORDER — SPIRONOLACTONE 25 MG PO TABS
50.0000 mg | ORAL_TABLET | Freq: Every day | ORAL | Status: DC
  Administered 2022-05-31 – 2022-06-15 (×16): 50 mg
  Filled 2022-05-31 (×17): qty 2

## 2022-05-31 MED ORDER — SODIUM PHOSPHATES 45 MMOLE/15ML IV SOLN
30.0000 mmol | Freq: Once | INTRAVENOUS | Status: AC
  Administered 2022-05-31: 30 mmol via INTRAVENOUS
  Filled 2022-05-31 (×2): qty 10

## 2022-05-31 NOTE — Progress Notes (Signed)
Pt placed on SBT 7/5 30% on ventilator. Minute ventilation 9.0Lpm with volumes of 300-350cc and a RR of 28bpm.   Thick tan/yellow mucous removed from ETT, PRN CPT placed and performed for 15 minutes through bed. Pt lavaged and suctioned, seems cleared out after therapy.

## 2022-05-31 NOTE — Progress Notes (Signed)
[facility] ADULT ICU REPLACEMENT PROTOCOL   The patient does apply for the [facility] Adult ICU Electrolyte Replacment Protocol based on the criteria listed below:   1.Exclusion criteria: TCTS, ECMO, Dialysis, and Myasthenia Gravis patients 2. Is GFR >/= 30 ml/min? Yes.    Patient's GFR today is >60 3. Is SCr </= 2? Yes.   Patient's SCr is 0.62 mg/dL 4. Did SCr increase >/= 0.5 in 24 hours? No. 5.Pt's weight >40kg  Yes.   6. Abnormal electrolyte(s): k 3.2, mag 1.7  7. Electrolytes replaced per protocol 8.  Call MD STAT for K+ </= 2.5, Phos </= 1, or Mag </= 1 Physician:    Ronda Fairly A 05/31/2022 6:40 AM

## 2022-05-31 NOTE — Progress Notes (Signed)
Deanna Mcmillan for Infectious Disease  Date of Admission:  05/24/2022   Total days of inpatient antibiotics 3  Principal Problem:   Acute respiratory failure with hypoxia (HCC)          Assessment: #Listeria monocytogenes, MSSA bacteremia #Fever #C/F meningitis - Blood cultures from 11/7 showing 1/2 staph epi, MSSA, 2/2 Listeria monocytogenes.  We will do double coverage with ampicillin gentamicin administered. - Per mother at bedside patient had not been feeling well for last few weeks.  On Monday she had abdominal pain throughout the day followed by headache at night but states she was unarousable and presented to the ED.  She was intubated. - I think she would benefit from a LP given Listeria bacteremia and her history of headache along with fevers.  Unclear how long patient has been bacteremia, mother reports patient had not been feeling well since being hospitalized for alcoholic hepatitis 53/2 - 10/6.  She was discharged on steroids 40 mg daily for 28 days she was again hospitalized 10/20 - 10/21 for bilateral lower extremity edema suspected secondary to third spacing, discharged on midodrine. It is possible patient developed SBP then became bacteremic along with meningitis versus she had bacteremia due to multiple healthcare exposures and underlying cirrhosis developing SBP and bacteremia.  Cultures and studies are pending.   #Alcoholic cirrhosis with ascites SP paracentesis on 11/8 -50 cc of cloudy-yellowish fluid drained, Cx pending         Recommendations:  -D/c metronidazole -Continue ampicillin and gentamicin for Listeria bacteremia -Continue CTX for SBP(meningitis dosing) -Continue vancomycin for now, will order penicillinase testing(hopefully can use AMP) -Recommend LP with bacterial, AFB, fungal Cx, ME panel -Follow blood Cx -Follow peritoneal Cx -TTE  #Urine Cx + Ecoli - Abx as above Microbiology:   Antibiotics: Vancomycin 11/7- Metronidazole  11/17- Cefepime 11/17-11/8  Gent 11/8- Ampicillin 11/8-   Cultures: Blood 11/7 1/2 MSSA, staph epi, listeria monocytogenes   11/8 Resp Cx pending, RVP negative Urine Cx + UTI  SUBJECTIVE: Intubated, mother at bedside Interval: Tmax of 102  Review of Systems: Review of Systems  Unable to perform ROS: Intubated     Scheduled Meds:  Chlorhexidine Gluconate Cloth  6 each Topical Q0600   docusate  100 mg Per Tube BID   famotidine  20 mg Per Tube BID   fentaNYL (SUBLIMAZE) injection  50 mcg Intravenous Once   folic acid  1 mg Per Tube Daily   furosemide  80 mg Intravenous Daily   heparin  5,000 Units Subcutaneous Q8H   lactulose  30 g Per Tube TID   mouth rinse  15 mL Mouth Rinse Q2H   polyethylene glycol  17 g Per Tube Daily   spironolactone  50 mg Per Tube Daily   thiamine  100 mg Per Tube Daily   Continuous Infusions:  amiodarone 30 mg/hr (05/31/22 1015)   ampicillin (OMNIPEN) IV 2 g (05/31/22 1127)   cefTRIAXone (ROCEPHIN)  IV 2 g (05/31/22 0908)   feeding supplement (VITAL AF 1.2 CAL) 20 mL/hr at 05/31/22 0800   fentaNYL infusion INTRAVENOUS 50 mcg/hr (05/31/22 0800)   gentamicin 130 mg (05/31/22 0953)   potassium chloride 10 mEq (05/31/22 1126)   propofol (DIPRIVAN) infusion Stopped (05/31/22 0754)   sodium phosphate 30 mmol in dextrose 5 % 250 mL infusion 30 mmol (05/31/22 1152)   vancomycin Stopped (05/31/22 0150)   PRN Meds:.acetaminophen, docusate, fentaNYL, mouth rinse, polyethylene glycol No Known Allergies  OBJECTIVE:  Vitals:   05/31/22 0815 05/31/22 0838 05/31/22 0900 05/31/22 1109  BP: 126/83     Pulse: (!) 107     Resp: (!) 22     Temp: 100.2 F (37.9 C)   (!) 100.8 F (38.2 C)  TempSrc:      SpO2: 98% 96%    Weight:   84.1 kg    Body mass index is 31.83 kg/m.  Physical Exam Constitutional:      Comments: Intubated and sedated.   HENT:     Head: Normocephalic and atraumatic.     Right Ear: Tympanic membrane normal.     Left Ear:  Tympanic membrane normal.     Nose: Nose normal.     Mouth/Throat:     Mouth: Mucous membranes are moist.  Eyes:     Extraocular Movements: Extraocular movements intact.     Conjunctiva/sclera: Conjunctivae normal.     Pupils: Pupils are equal, round, and reactive to light.  Cardiovascular:     Rate and Rhythm: Normal rate and regular rhythm.     Heart sounds: No murmur heard.    No friction rub. No gallop.  Pulmonary:     Effort: Pulmonary effort is normal.     Breath sounds: Normal breath sounds.  Abdominal:     General: Abdomen is flat.     Palpations: Abdomen is soft.  Musculoskeletal:        General: Normal range of motion.  Skin:    General: Skin is warm and dry.  Psychiatric:        Mood and Affect: Mood normal.       Lab Results Lab Results  Component Value Date   WBC 14.0 (H) 05/31/2022   HGB 9.8 (L) 05/31/2022   HCT 28.1 (L) 05/31/2022   MCV 109.8 (H) 05/31/2022   PLT 87 (L) 05/31/2022    Lab Results  Component Value Date   CREATININE 0.62 05/31/2022   BUN 15 05/31/2022   NA 141 05/31/2022   K 3.2 (L) 05/31/2022   CL 111 05/31/2022   CO2 22 05/31/2022    Lab Results  Component Value Date   ALT 52 (H) 05/30/2022   AST 90 (H) 05/30/2022   ALKPHOS 93 05/30/2022   BILITOT 5.0 (H) 05/30/2022        Laurice Record, [place] [facility] Infectious [facility] 05/31/2022, 12:05 PM

## 2022-05-31 NOTE — Progress Notes (Signed)
NAME:  Deanna Mcmillan, MRN:  [id number], DOB:  04/16/1971, LOS: 2 ADMISSION DATE:  06/05/2022, CONSULTATION DATE:  11/7 REFERRING MD:  Lendon Colonel, EDP CHIEF COMPLAINT:  AMS   History of Present Illness:  51 yo woman with hx of recent diagnosis of alcoholic hepatitis (dx 84/13), here with ams, fever.   History from [name].  Patient was in her normal state of health, until suddenly last night she developed a headache.  She went to sleep to rest.  Today she slept throughout the day, this evening her mother went to wake her and was unable to wake her up, so she called 911.     She has been doing well since her recent hospital discharge, taking all the meds as prescribed, no new meds.  Planned follow up with GI on 11/14.  LE edema had been improving.  No cough, sob no focal symptoms.  No known sick contacts.     In ED, febrile, tachycardic, lethargic, desaturating to 70s.   Intubated by ED physician.    Had been admitted earlier in October and started on course of prenisone for alc hepatitis.    Recently seen at [facility] 10/19: LE Edema. Lasix not helpful at home.  Admitted for diuresis.  Jaundice and abd distension also noted.  Pleural effusion.  GI saw: non enough ascites for tap  Started lasix and spironolactone.  28 day course of prednisolone, miralax, lactulose, miralax. Midodrine TID    In ED got cefepime, LR 1L, flagyl ordered, intubated, started on Propofol, vanc ordered   Pertinent  Medical History  ETOH Cirrhosis BLE edema recently worsening  S/p lap appy 2014    Echo: recent normal 24/4010   Home meds: folic ascid, acetaminohpen, lasix 40bid, lactulose 30 TID protonix, prednisone (04/29/22), prednisolone 04/28/22 three week course, thiamine, simethicone,    Per her mother she had been drinking at least one bottle of wine daily, she isnt clear exactly how much she was drinking but she did also drink [place].  She notes she had wanted to stop recently but been unable to.   She thinks she was depressed after experiencing perimenopause.  No tobacco.   Significant Hospital Events: Including procedures, antibiotic start and stop dates in addition to other pertinent events   11/7: Intubated in ED and admitted to ICU 11/7: CTH neg, CT A/P cirrhosis with portal hypertension, splenomegaly, moderate volume ascites, moderate bilateral pleural effusion 11/9 KUB: No ileus  Interim History / Subjective:  No acute events overnight Weaning down sedation, pt slowly waking up Remain on pressure support this AM  Objective   Blood pressure 112/88, pulse (!) 115, temperature (!) 100.8 F (38.2 C), resp. rate (!) 23, weight 77.1 kg, SpO2 95 %.    Vent Mode: PRVC FiO2 (%):  [40 %] 40 % Set Rate:  [20 bmp] 20 bmp Vt Set:  [420 mL] 420 mL PEEP:  [5 cmH20] 5 cmH20 Plateau Pressure:  [16 cmH20-116 cmH20] 17 cmH20   Intake/Output Summary (Last 24 hours) at 05/31/2022 0734 Last data filed at 05/31/2022 0300 Gross per 24 hour  Intake 3411.59 ml  Output 3825 ml  Net -413.41 ml   Filed Weights   06/07/2022 2000 06/08/2022 2215 05/30/22 0145  Weight: 101 kg 77.1 kg 77.1 kg    Examination: General: Acutely ill middle-aged woman laying in bed.  NAD. HENT: [place]/AT. ET tube in place. Scleral icterus. Slight conjunctival edema. Lungs: Mechanical lung sounds. Decreased breath sounds anteriorly.  Cardiovascular: Tachycardic.  Regular rhythm.  2+ BLE pitting edema, improving Abdomen: Mildly distended. No spider angiomata. No significant fluid wave. Extremities: Well perfused.  Warm and dry. Mild erythema, blanchable on both foot.  Neuro: Sedated. RASS -2. Opening eyes spontaneously.  GU: Foley in place  K+ 3.2, sCr 0.62, Mag 2.4, phos 1.7 WBC 14.0, hgb 9.8, plt 87 CBGs 140s-160s KUB does not show ileus  Resolved Hospital Problem list    Assessment & Plan:   Severe sepsis Acute metabolic encephalopathy Listeria, MSSA bacteremia Patient with undifferentiated sepsis intubated  in the ER due to AMS and inability to protect airway.  CT head negative.  Blood cultures from 11/7 grew 1/2 staph epi, MSSA, Listeria monocytogenes. Remains on broad-spectrum abx. RVP neg. Maintaining BP w/o pressors. Remains febrile but fever curve slowing trending down. Plan for LP today to rule out meningitis. Endocarditis unlikely based on [facility]'s criteria.  -ID following, appreciate recs -Continue Ampicillin, gentamicin, vanc, flagyl and rocephin -Lumbar puncture today -F/u repeat TTE -F/u repeat blood culture -Wean sedation as able -Trend CBC/fever curve    Acute hypoxic respiratory failure CAP versus aspiration pneumonia Compressive atelectasis Patient remains on full vent support.  Elevated Pro-Cal on admission.  Imaging with opacities concerning for possible pneumonia as well as moderate bilateral pleural effusion. RVP negative. Tolerating pressure support this AM.  -Continue full vent support -SBT when able -Continue antibiotics as above  Alcoholic cirrhosis Hepatic encephalopathy? Recently diagnosed cirrhosis during last hospitalization.  Concern for possible hepatic encephalopathy and possible SBP. MELD Na score of 17. Child Pugh Class C. FIB-4 score of 7.46 indicates advance fibrosis . Peritoneal labs not consistent with SBP, however, patient received abx prior to tap. UOP of 3.3 L in last 24 hrs.  -Continue abx -Continue IV lasix 80 mg daily -Resume home spironolactone -Cont thiamine, folate., MVI -Continue lactulose, goal 2-3 BM  Hypokalemia Hypophosphatemia K+ down to 3.2 today after IV diuresing. Mag 2.4. Phos 1.7 -Repleting K= and phos -Trend electrolytes  Best Practice (right click and "Reselect all SmartList Selections" daily)   Diet/type: NPO DVT prophylaxis: prophylactic heparin  GI prophylaxis: PPI Lines: N/A Foley:  Yes, and it is still needed Code Status:  full code Last date of multidisciplinary goals of care discussion [updated mother at  bedside]  Labs   CBC: Recent Labs  Lab 06/11/2022 1950 05/27/2022 2028 05/30/22 0040 05/30/22 0453 05/31/22 0500  WBC 12.5*  --  11.4*  --  14.0*  NEUTROABS 11.2*  --   --   --  12.3*  HGB 11.8* 10.9* 10.8* 10.5* 9.8*  HCT 34.3* 32.0* 32.9* 31.0* 28.1*  MCV 110.6*  --  114.2*  --  109.8*  PLT 111*  --  87*  --  87*    Basic Metabolic Panel: Recent Labs  Lab 06/13/2022 2028 06/09/2022 2138 05/30/22 0040 05/30/22 0453 05/31/22 0500  NA 139 140 140 138 141  K 3.9 4.0 4.0 3.5 3.2*  CL  --  107 108  --  111  CO2  --  20* 19*  --  22  GLUCOSE  --  185* 183*  --  173*  BUN  --  19 18  --  15  CREATININE  --  0.91 0.79  --  0.62  CALCIUM  --  8.8* 8.7*  --  8.7*  MG  --   --  2.0  --  2.4  PHOS  --   --  3.6  --  1.7*   GFR: Estimated Creatinine  Clearance: 82.7 mL/min (by C-G formula based on SCr of 0.62 mg/dL). Recent Labs  Lab 06/09/2022 1950 06/16/2022 2138 05/30/22 0040 05/31/22 0500  PROCALCITON  --  1.31 2.25 2.93  WBC 12.5*  --  11.4* 14.0*  LATICACIDVEN 2.9* 5.0* 5.1*  --     Liver Function Tests: Recent Labs  Lab 05/31/2022 2138 05/30/22 0040  AST 101* 90*  ALT 52* 52*  ALKPHOS 100 93  BILITOT 5.0* 5.0*  PROT 5.7* 5.6*  ALBUMIN 2.3* 2.3*   No results for input(s): "LIPASE", "AMYLASE" in the last 168 hours. Recent Labs  Lab 06/06/2022 1937 05/30/22 0040  AMMONIA 91* 58*    ABG    Component Value Date/Time   PHART 7.379 05/30/2022 0453   PCO2ART 34.3 05/30/2022 0453   PO2ART 90 05/30/2022 0453   HCO3 19.9 (L) 05/30/2022 0453   TCO2 21 (L) 05/30/2022 0453   ACIDBASEDEF 4.0 (H) 05/30/2022 0453   O2SAT 96 05/30/2022 0453     Coagulation Profile: Recent Labs  Lab 06/06/2022 1950 05/30/22 0040  INR 1.4* 1.5*    Cardiac Enzymes: No results for input(s): "CKTOTAL", "CKMB", "CKMBINDEX", "TROPONINI" in the last 168 hours.  HbA1C: Hgb A1c MFr Bld  Date/Time Value Ref Range Status  04/25/2022 03:41 AM <4.2 (L) 4.8 - 5.6 % Final    Comment:     (NOTE) **Verified by repeat analysis**         Prediabetes: 5.7 - 6.4         Diabetes: >6.4         Glycemic control for adults with diabetes: <7.0     CBG: Recent Labs  Lab 06/13/2022 2322 05/30/22 0326 05/30/22 1940 05/30/22 2327 05/31/22 0329  GLUCAP 167* 197* 149* 164* 159*    Review of Systems:   As in HPI  Past Medical History:  She,  has no past medical history on file.   Surgical History:   Past Surgical History:  Procedure Laterality Date   KNEE ARTHROSCOPY W/ ACL RECONSTRUCTION Left 1994/5   Dr. Weston Anna   LAPAROSCOPIC APPENDECTOMY N/A 04/11/2013   Procedure: APPENDECTOMY LAPAROSCOPIC;  Surgeon: Stark Klein, MD;  Location: [facility];  Service: General;  Laterality: N/A;   [place]   3 extractions     Social History:   reports that she has never smoked. She does not have any smokeless tobacco history on file. She reports current alcohol use. She reports that she does not use drugs.   Family History:  Her family history is not on file.   Allergies No Known Allergies   Home Medications  Prior to Admission medications   Medication Sig Start Date End Date Taking? Authorizing Provider  acetaminophen (TYLENOL) 325 MG tablet Take 650 mg by mouth every 6 (six) hours as needed for headache or moderate pain.   Yes [provider]  folic acid (FOLVITE) 1 MG tablet Take 1 tablet (1 mg total) by mouth daily. 04/28/22  Yes Hosie Poisson, MD  furosemide (LASIX) 40 MG tablet Take 1 tablet (40 mg total) by mouth daily. 05/12/22 05/12/23 Yes Sheikh, Omair Latif, DO  ibuprofen (ADVIL) 200 MG tablet Take 400 mg by mouth every 6 (six) hours as needed for headache or moderate pain.   Yes [provider]  KLOR-CON M20 20 MEQ tablet Take 20 mEq by mouth daily. 05/14/22  Yes [provider]  lactulose (CHRONULAC) 10 GM/15ML solution Take 30 mLs (20 g total) by mouth 3 (three) times  daily. 04/27/22  Yes Hosie Poisson, MD  Multiple  Vitamin (MULTIVITAMIN WITH MINERALS) TABS tablet Take 1 tablet by mouth daily. 04/28/22  Yes Hosie Poisson, MD  pantoprazole (PROTONIX) 40 MG tablet Take 1 tablet (40 mg total) by mouth daily. 04/27/22 04/27/23 Yes Hosie Poisson, MD  polyethylene glycol (MIRALAX / GLYCOLAX) 17 g packet Take 17 g by mouth daily as needed for mild constipation. 05/12/22  Yes Sheikh, Omair Latif, DO  prednisoLONE 5 MG TABS tablet Take 40 mg by mouth daily. 05/14/22  Yes [provider]  predniSONE (DELTASONE) 20 MG tablet Take 40 mg by mouth daily. 04/29/22  Yes [provider]  simethicone (MYLICON) 80 MG chewable tablet Chew 1 tablet (80 mg total) by mouth every 6 (six) hours as needed for flatulence. 04/27/22  Yes Hosie Poisson, MD  spironolactone (ALDACTONE) 50 MG tablet Take 1 tablet (50 mg total) by mouth daily. 05/13/22  Yes Sheikh, Omair Latif, DO  thiamine (VITAMIN B-1) 100 MG tablet Take 1 tablet (100 mg total) by mouth daily. 04/28/22  Yes Hosie Poisson, MD     Critical care time: 38

## 2022-05-31 NOTE — Progress Notes (Incomplete)
Echocardiogram 2D Echocardiogram has been performed.  Ronny Flurry 05/31/2022, 1:52 PM

## 2022-05-31 NOTE — Progress Notes (Signed)
Pt w/ TL CVL, requiring multiple IV infusions. PIV access still needed. Obtained g 20 1.75 to LUA, recommended to RN to use Ivwatch for monitoring even w/ ABT infusion dt location of new PIV.

## 2022-05-31 NOTE — Progress Notes (Signed)
Pharmacy Electrolyte Replacement  Recent Labs:  Recent Labs    05/31/22 0500  K 3.2*  MG 2.4  PHOS 1.7*  CREATININE 0.62    Low Critical Values (K </= 2.5, Phos </= 1, Mg </= 1) Present: None  Plan: K already being repleted per E-Link overnight. Will replete phos with 31mmol of NaPhos. Re-check in AM per protocol.

## 2022-06-01 ENCOUNTER — Inpatient Hospital Stay (HOSPITAL_COMMUNITY): Payer: 59

## 2022-06-01 DIAGNOSIS — J9601 Acute respiratory failure with hypoxia: Secondary | ICD-10-CM | POA: Diagnosis not present

## 2022-06-01 DIAGNOSIS — A419 Sepsis, unspecified organism: Secondary | ICD-10-CM

## 2022-06-01 LAB — GENTAMICIN LEVEL, PEAK: Gentamicin Pk: 6.9 ug/mL (ref 5.0–10.0)

## 2022-06-01 LAB — COMPREHENSIVE METABOLIC PANEL
ALT: 32 U/L (ref 0–44)
AST: 47 U/L — ABNORMAL HIGH (ref 15–41)
Albumin: 2.6 g/dL — ABNORMAL LOW (ref 3.5–5.0)
Alkaline Phosphatase: 76 U/L (ref 38–126)
Anion gap: 8 (ref 5–15)
BUN: 16 mg/dL (ref 6–20)
CO2: 23 mmol/L (ref 22–32)
Calcium: 8.3 mg/dL — ABNORMAL LOW (ref 8.9–10.3)
Chloride: 113 mmol/L — ABNORMAL HIGH (ref 98–111)
Creatinine, Ser: 0.56 mg/dL (ref 0.44–1.00)
GFR, Estimated: >60 mL/min (ref >60)
Glucose, Bld: 171 mg/dL — ABNORMAL HIGH (ref 70–99)
Potassium: 2.7 mmol/L — CL (ref 3.5–5.1)
Sodium: 144 mmol/L (ref 135–145)
Total Bilirubin: 3.8 mg/dL — ABNORMAL HIGH (ref 0.3–1.2)
Total Protein: 5.5 g/dL — ABNORMAL LOW (ref 6.5–8.1)

## 2022-06-01 LAB — BASIC METABOLIC PANEL
Anion gap: 10 (ref 5–15)
BUN: 16 mg/dL (ref 6–20)
CO2: 23 mmol/L (ref 22–32)
Calcium: 8.3 mg/dL — ABNORMAL LOW (ref 8.9–10.3)
Chloride: 111 mmol/L (ref 98–111)
Creatinine, Ser: 0.64 mg/dL (ref 0.44–1.00)
GFR, Estimated: >60 mL/min (ref >60)
Glucose, Bld: 179 mg/dL — ABNORMAL HIGH (ref 70–99)
Potassium: 3.5 mmol/L (ref 3.5–5.1)
Sodium: 144 mmol/L (ref 135–145)

## 2022-06-01 LAB — CBC WITH DIFFERENTIAL/PLATELET
Abs Immature Granulocytes: 0.16 10*3/uL — ABNORMAL HIGH (ref 0.00–0.07)
Basophils Absolute: 0 10*3/uL (ref 0.0–0.1)
Basophils Relative: 0 %
Eosinophils Absolute: 0 10*3/uL (ref 0.0–0.5)
Eosinophils Relative: 0 %
HCT: 28.4 % — ABNORMAL LOW (ref 36.0–46.0)
Hemoglobin: 9.4 g/dL — ABNORMAL LOW (ref 12.0–15.0)
Immature Granulocytes: 1 %
Lymphocytes Relative: 9 %
Lymphs Abs: 1.1 10*3/uL (ref 0.7–4.0)
MCH: 37.5 pg — ABNORMAL HIGH (ref 26.0–34.0)
MCHC: 33.1 g/dL (ref 30.0–36.0)
MCV: 113.1 fL — ABNORMAL HIGH (ref 80.0–100.0)
Monocytes Absolute: 0.8 10*3/uL (ref 0.1–1.0)
Monocytes Relative: 6 %
Neutro Abs: 11 10*3/uL — ABNORMAL HIGH (ref 1.7–7.7)
Neutrophils Relative %: 84 %
Platelets: 71 10*3/uL — ABNORMAL LOW (ref 150–400)
RBC: 2.51 MIL/uL — ABNORMAL LOW (ref 3.87–5.11)
RDW: 16.1 % — ABNORMAL HIGH (ref 11.5–15.5)
WBC: 13.1 10*3/uL — ABNORMAL HIGH (ref 4.0–10.5)
nRBC: 0 % (ref 0.0–0.2)

## 2022-06-01 LAB — LEGIONELLA PNEUMOPHILA SEROGP 1 UR AG: L. pneumophila Serogp 1 Ur Ag: NEGATIVE

## 2022-06-01 LAB — CSF CELL COUNT WITH DIFFERENTIAL
Eosinophils, CSF: 1 % (ref 0–1)
Lymphs, CSF: 53 % (ref 40–80)
Monocyte-Macrophage-Spinal Fluid: 9 % — ABNORMAL LOW (ref 15–45)
RBC Count, CSF: 26400 /mm3 — ABNORMAL HIGH
Segmented Neutrophils-CSF: 37 % — ABNORMAL HIGH (ref 0–6)
Tube #: 1
WBC, CSF: 80 /mm3 (ref 0–5)

## 2022-06-01 LAB — MENINGITIS/ENCEPHALITIS PANEL (CSF)
Cryptococcus neoformans/gattii (CSF): NOT DETECTED
Cytomegalovirus (CSF): NOT DETECTED
Enterovirus (CSF): NOT DETECTED
Escherichia coli K1 (CSF): NOT DETECTED
Haemophilus influenzae (CSF): NOT DETECTED
Herpes simplex virus 1 (CSF): NOT DETECTED
Herpes simplex virus 2 (CSF): NOT DETECTED
Human herpesvirus 6 (CSF): NOT DETECTED
Human parechovirus (CSF): NOT DETECTED
Listeria monocytogenes (CSF): DETECTED — AB
Neisseria meningitis (CSF): NOT DETECTED
Streptococcus agalactiae (CSF): NOT DETECTED
Streptococcus pneumoniae (CSF): NOT DETECTED
Varicella zoster virus (CSF): NOT DETECTED

## 2022-06-01 LAB — POTASSIUM
Potassium: >7.5 mmol/L (ref 3.5–5.1)
Potassium: 4 mmol/L (ref 3.5–5.1)

## 2022-06-01 LAB — GENTAMICIN LEVEL, TROUGH: Gentamicin Trough: 4.3 ug/mL (ref 0.5–2.0)

## 2022-06-01 LAB — PHOSPHORUS: Phosphorus: 2.6 mg/dL (ref 2.5–4.6)

## 2022-06-01 LAB — CULTURE, RESPIRATORY W GRAM STAIN

## 2022-06-01 LAB — GLUCOSE, CAPILLARY
Glucose-Capillary: 144 mg/dL — ABNORMAL HIGH (ref 70–99)
Glucose-Capillary: 157 mg/dL — ABNORMAL HIGH (ref 70–99)
Glucose-Capillary: 131 mg/dL — ABNORMAL HIGH (ref 70–99)
Glucose-Capillary: 130 mg/dL — ABNORMAL HIGH (ref 70–99)
Glucose-Capillary: 165 mg/dL — ABNORMAL HIGH (ref 70–99)

## 2022-06-01 LAB — CYTOLOGY - NON PAP

## 2022-06-01 LAB — MAGNESIUM: Magnesium: 2.3 mg/dL (ref 1.7–2.4)

## 2022-06-01 LAB — PROTEIN AND GLUCOSE, CSF
Glucose, CSF: 86 mg/dL — ABNORMAL HIGH (ref 40–70)
Total  Protein, CSF: >600 mg/dL — ABNORMAL HIGH (ref 15–45)

## 2022-06-01 MED ORDER — VITAL AF 1.2 CAL PO LIQD
1000.0000 mL | ORAL | Status: DC
  Administered 2022-06-01: 1000 mL

## 2022-06-01 MED ORDER — POTASSIUM CHLORIDE 10 MEQ/50ML IV SOLN
10.0000 meq | INTRAVENOUS | Status: AC
  Administered 2022-06-01 (×4): 10 meq via INTRAVENOUS
  Filled 2022-06-01 (×4): qty 50

## 2022-06-01 MED ORDER — POTASSIUM CHLORIDE 20 MEQ PO PACK
40.0000 meq | PACK | Freq: Once | ORAL | Status: DC
  Filled 2022-06-01: qty 2

## 2022-06-01 MED ORDER — POLYETHYLENE GLYCOL 3350 17 G PO PACK
17.0000 g | PACK | Freq: Two times a day (BID) | ORAL | Status: DC
  Administered 2022-06-01 – 2022-06-03 (×6): 17 g
  Filled 2022-06-01 (×5): qty 1

## 2022-06-01 MED ORDER — POTASSIUM CHLORIDE 20 MEQ PO PACK
60.0000 meq | PACK | Freq: Once | ORAL | Status: AC
  Administered 2022-06-01: 60 meq
  Filled 2022-06-01: qty 3

## 2022-06-01 MED ORDER — BISACODYL 10 MG RE SUPP
10.0000 mg | Freq: Once | RECTAL | Status: AC
  Administered 2022-06-01: 10 mg via RECTAL
  Filled 2022-06-01: qty 1

## 2022-06-01 MED ORDER — FUROSEMIDE 10 MG/ML IJ SOLN
80.0000 mg | Freq: Once | INTRAMUSCULAR | Status: AC
  Administered 2022-06-01: 80 mg via INTRAVENOUS
  Filled 2022-06-01: qty 8

## 2022-06-01 MED ORDER — GENTAMICIN IN SALINE 1.6-0.9 MG/ML-% IV SOLN
80.0000 mg | Freq: Two times a day (BID) | INTRAVENOUS | Status: DC
  Administered 2022-06-01 – 2022-06-05 (×8): 80 mg via INTRAVENOUS
  Filled 2022-06-01 (×8): qty 50

## 2022-06-01 MED ORDER — LIDOCAINE HCL (PF) 1 % IJ SOLN
5.0000 mL | Freq: Once | INTRAMUSCULAR | Status: AC
  Administered 2022-06-01: 5 mL via INTRADERMAL

## 2022-06-01 MED ORDER — POTASSIUM CHLORIDE 20 MEQ PO PACK
60.0000 meq | PACK | ORAL | Status: AC
  Administered 2022-06-01 (×2): 60 meq
  Filled 2022-06-01 (×2): qty 3

## 2022-06-01 MED ORDER — METHYLNALTREXONE BROMIDE 12 MG/0.6ML ~~LOC~~ SOLN
12.0000 mg | Freq: Once | SUBCUTANEOUS | Status: AC
  Administered 2022-06-01: 12 mg via SUBCUTANEOUS
  Filled 2022-06-01: qty 0.6

## 2022-06-01 MED ORDER — VANCOMYCIN HCL IN DEXTROSE 1-5 GM/200ML-% IV SOLN
1000.0000 mg | Freq: Two times a day (BID) | INTRAVENOUS | Status: DC
  Administered 2022-06-01 – 2022-06-02 (×3): 1000 mg via INTRAVENOUS
  Filled 2022-06-01 (×4): qty 200

## 2022-06-01 NOTE — Progress Notes (Signed)
NAME:  Deanna Mcmillan, MRN:  [id number], DOB:  1971/02/03, LOS: 3 ADMISSION DATE:  05/24/2022, CONSULTATION DATE:  11/7 REFERRING MD:  Lendon Colonel, EDP CHIEF COMPLAINT:  AMS   History of Present Illness:  51 yo woman with hx of recent diagnosis of alcoholic hepatitis (dx 93/90), here with ams, fever.   History from her mother Deanna Mcmillan.  Patient was in her normal state of health, until suddenly last night she developed a headache.  She went to sleep to rest.  Today she slept throughout the day, this evening her mother went to wake her and was unable to wake her up, so she called 911.     She has been doing well since her recent hospital discharge, taking all the meds as prescribed, no new meds.  Planned follow up with GI on 11/14.  LE edema had been improving.  No cough, sob no focal symptoms.  No known sick contacts.     In ED, febrile, tachycardic, lethargic, desaturating to 70s.   Intubated by ED physician.    Had been admitted earlier in October and started on course of prenisone for alc hepatitis.    Recently seen at [facility] 10/19: LE Edema. Lasix not helpful at home.  Admitted for diuresis.  Jaundice and abd distension also noted.  Pleural effusion.  GI saw: non enough ascites for tap  Started lasix and spironolactone.  28 day course of prednisolone, miralax, lactulose, miralax. Midodrine TID    In ED got cefepime, LR 1L, flagyl ordered, intubated, started on Propofol, vanc ordered   Pertinent  Medical History  ETOH Cirrhosis BLE edema recently worsening  S/p lap appy 2014    Echo: recent normal 30/0923   Home meds: folic ascid, acetaminohpen, lasix 40bid, lactulose 30 TID protonix, prednisone (04/29/22), prednisolone 04/28/22 three week course, thiamine, simethicone,    Per her mother she had been drinking at least one bottle of wine daily, she isnt clear exactly how much she was drinking but she did also drink [place].  She notes she had wanted to stop recently but been unable to.   She thinks she was depressed after experiencing perimenopause.  No tobacco.   Significant Hospital Events: Including procedures, antibiotic start and stop dates in addition to other pertinent events   11/7: Intubated in ED and admitted to ICU 11/7: CTH neg, CT A/P cirrhosis with portal hypertension, splenomegaly, moderate volume ascites, moderate bilateral pleural effusion 11/9 KUB: No ileus  Interim History / Subjective:  No acute events overnight. Diuresed well, K+ low at 2.7 Remains sedated on full vent support this morning Reacts to noxious stimuli, eyes partially open Still no BMs this admission  Objective   Blood pressure 128/69, pulse 97, temperature 99.3 F (37.4 C), resp. rate (!) 21, weight 87.4 kg, SpO2 97 %. CVP:  [13 mmHg] 13 mmHg  Vent Mode: PRVC FiO2 (%):  [30 %-40 %] 30 % Set Rate:  [20 bmp] 20 bmp Vt Set:  [420 mL] 420 mL PEEP:  [5 cmH20] 5 cmH20 Pressure Support:  [5 cmH20-7 cmH20] 5 cmH20 Plateau Pressure:  [16 cmH20-18 cmH20] 16 cmH20   Intake/Output Summary (Last 24 hours) at 06/01/2022 0720 Last data filed at 06/01/2022 0600 Gross per 24 hour  Intake 3147.55 ml  Output 2765 ml  Net 382.55 ml   Filed Weights   05/30/22 0145 05/31/22 0900 06/01/22 0500  Weight: 77.1 kg 84.1 kg 87.4 kg    Examination: General: Acutely ill middle-aged woman laying in bed.  NAD. HENT: [place]/AT. ET tube in place. Scleral icterus. Slight conjunctival edema. Lungs: Mechanical lung sounds. Decreased breath sounds anteriorly.  Cardiovascular: Tachycardic. Regular rhythm.  2+ BLE pitting edema, improving Abdomen: Tight, mildly distended. No spider angiomata. No significant fluid wave. Extremities: Well perfused.  Warm and dry. Mild erythema, blanchable on both foot.  Neuro: Sedated. RASS -2.  Opens eyes and moves feet with noxious stimuli. GU: Foley in place  WBC 13.1, Hgb 9.4, platelets 71 K+ 2.7, creatinine 0.56, Phos 3.6, mag 2.3 CBG 120s to 160s  Resolved Hospital  Problem list    Assessment & Plan:   Severe sepsis Acute metabolic encephalopathy Listeria, MSSA bacteremia Patient with undifferentiated sepsis intubated in the ER due to AMS and inability to protect airway.  CT head negative.  Blood cultures from 11/7 grew 1/2 staph epi, MSSA, Listeria monocytogenes.  Repeat bcx on 11/08 NGTD. Remains on broad-spectrum abx. RVP neg. BP remained stable.  Remains sedated this morning.  Pending LP under fluoroscopy today.  Repeat TTE with poor acoustic window but no abnormalities or vegetation seen. -ID following, appreciate recs -Continue Ampicillin, gentamicin, vanc, and rocephin -F/u repeat blood culture -Wean sedation as able -Trend CBC/fever curve  -LP later today under fluoroscopy -Follow-up CSF labs   Acute hypoxic respiratory failure CAP versus aspiration pneumonia Compressive atelectasis Patient remains on full vent support.  Elevated Pro-Cal on admission.  Imaging with opacities concerning for possible pneumonia as well as moderate bilateral pleural effusion. RVP negative.  Remained on full vent support overnight. -Continue full vent support -SBT when able -Continue aggressive diuresing -Continue antibiotics as above  Alcoholic cirrhosis Hepatic encephalopathy? Recently diagnosed cirrhosis during last hospitalization.  Concern for possible hepatic encephalopathy and possible SBP. MELD Na score of 17. Child Pugh Class C. FIB-4 score of 7.46 indicates advance fibrosis . Peritoneal labs not consistent with SBP, however, patient received abx prior to tap.  UOP of 2.9 L in the last 24 hours. Net +6.6 L since admission. -Continue abx -IV Lasix 80 mg this morning, will consider redosing in the evening after repletion potassium -Continue home spironolactone -Cont thiamine, folate., MVI -Continue lactulose, goal 2-3 BM  Constipation Patient with no BM since admission.  Abdomen is mildly distended but recent KUB negative for ileus. -Give Dulcolax  suppository, Relistor injection -Increase MiraLAX to BID, continue Colace -Continue lactulose 30 mg TID  SVT A-fib? Sinus tach Patient found to have SVT 2 days ago requiring amnio bolus and infusion. Repeat EKG shows sinus tach with frequent PACs and occasional PVCs. -Repeat EKG -Continue Amio for now -Replete electrolytes  Hypokalemia Hypophosphatemia, resolved. Significant drop in K+ to 2.7 overnight. Phos 3.6, mag 2.3 -Replete K+ with IV and p.o. -Q4H potassium until normal x2 -Trend electrolytes  Best Practice (right click and "Reselect all SmartList Selections" daily)   Diet/type: tubefeeds and NPO DVT prophylaxis: prophylactic heparin  GI prophylaxis: PPI Lines: N/A Foley:  Yes, and it is still needed Code Status:  full code Last date of multidisciplinary goals of care discussion [updated mother at bedside]  Labs   CBC: Recent Labs  Lab 06/01/2022 1950 06/06/2022 2028 05/30/22 0040 05/30/22 0453 05/31/22 0500 06/01/22 0520  WBC 12.5*  --  11.4*  --  14.0* 13.1*  NEUTROABS 11.2*  --   --   --  12.3* 11.0*  HGB 11.8* 10.9* 10.8* 10.5* 9.8* 9.4*  HCT 34.3* 32.0* 32.9* 31.0* 28.1* 28.4*  MCV 110.6*  --  114.2*  --  109.8* 113.1*  PLT  111*  --  87*  --  87* 71*    Basic Metabolic Panel: Recent Labs  Lab 06/13/2022 2138 05/30/22 0040 05/30/22 0453 05/31/22 0500 06/01/22 0520  NA 140 140 138 141 144  K 4.0 4.0 3.5 3.2* 2.7*  CL 107 108  --  111 113*  CO2 20* 19*  --  22 23  GLUCOSE 185* 183*  --  173* 171*  BUN 19 18  --  15 16  CREATININE 0.91 0.79  --  0.62 0.56  CALCIUM 8.8* 8.7*  --  8.7* 8.3*  MG  --  2.0  --  2.4 2.3  PHOS  --  3.6  --  1.7* 2.6   GFR: Estimated Creatinine Clearance: 88 mL/min (by C-G formula based on SCr of 0.56 mg/dL). Recent Labs  Lab 05/27/2022 1950 06/15/2022 2138 05/30/22 0040 05/31/22 0500 05/31/22 1510 06/01/22 0520  PROCALCITON  --  1.31 2.25 2.93  --   --   WBC 12.5*  --  11.4* 14.0*  --  13.1*  LATICACIDVEN 2.9* 5.0*  5.1*  --  2.2*  --     Liver Function Tests: Recent Labs  Lab 06/10/2022 2138 05/30/22 0040 06/01/22 0520  AST 101* 90* 47*  ALT 52* 52* 32  ALKPHOS 100 93 76  BILITOT 5.0* 5.0* 3.8*  PROT 5.7* 5.6* 5.5*  ALBUMIN 2.3* 2.3* 2.6*   No results for input(s): "LIPASE", "AMYLASE" in the last 168 hours. Recent Labs  Lab 06/02/2022 1937 05/30/22 0040  AMMONIA 91* 58*    ABG    Component Value Date/Time   PHART 7.379 05/30/2022 0453   PCO2ART 34.3 05/30/2022 0453   PO2ART 90 05/30/2022 0453   HCO3 19.9 (L) 05/30/2022 0453   TCO2 21 (L) 05/30/2022 0453   ACIDBASEDEF 4.0 (H) 05/30/2022 0453   O2SAT 96 05/30/2022 0453     Coagulation Profile: Recent Labs  Lab 06/10/2022 1950 05/30/22 0040  INR 1.4* 1.5*    Cardiac Enzymes: No results for input(s): "CKTOTAL", "CKMB", "CKMBINDEX", "TROPONINI" in the last 168 hours.  HbA1C: Hgb A1c MFr Bld  Date/Time Value Ref Range Status  04/25/2022 03:41 AM <4.2 (L) 4.8 - 5.6 % Final    Comment:    (NOTE) **Verified by repeat analysis**         Prediabetes: 5.7 - 6.4         Diabetes: >6.4         Glycemic control for adults with diabetes: <7.0     CBG: Recent Labs  Lab 05/31/22 1109 05/31/22 1525 05/31/22 1948 05/31/22 2322 06/01/22 0332  GLUCAP 157* 129* 159* 165* 144*    Review of Systems:   As in HPI  Past Medical History:  She,  has no past medical history on file.   Surgical History:   Past Surgical History:  Procedure Laterality Date   KNEE ARTHROSCOPY W/ ACL RECONSTRUCTION Left 1994/5   Dr. Weston Anna   LAPAROSCOPIC APPENDECTOMY N/A 04/11/2013   Procedure: APPENDECTOMY LAPAROSCOPIC;  Surgeon: Stark Klein, MD;  Location: [facility];  Service: General;  Laterality: N/A;   [place]   3 extractions     Social History:   reports that she has never smoked. She does not have any smokeless tobacco history on file. She reports current alcohol use. She reports that she does not use drugs.    Family History:  Her family history is not on file.   Allergies No Known Allergies   Home  Medications  Prior to Admission medications   Medication Sig Start Date End Date Taking? Authorizing Provider  acetaminophen (TYLENOL) 325 MG tablet Take 650 mg by mouth every 6 (six) hours as needed for headache or moderate pain.   Yes [provider]  folic acid (FOLVITE) 1 MG tablet Take 1 tablet (1 mg total) by mouth daily. 04/28/22  Yes Hosie Poisson, MD  furosemide (LASIX) 40 MG tablet Take 1 tablet (40 mg total) by mouth daily. 05/12/22 05/12/23 Yes Sheikh, Omair Latif, DO  ibuprofen (ADVIL) 200 MG tablet Take 400 mg by mouth every 6 (six) hours as needed for headache or moderate pain.   Yes [provider]  KLOR-CON M20 20 MEQ tablet Take 20 mEq by mouth daily. 05/14/22  Yes [provider]  lactulose (CHRONULAC) 10 GM/15ML solution Take 30 mLs (20 g total) by mouth 3 (three) times daily. 04/27/22  Yes Hosie Poisson, MD  Multiple Vitamin (MULTIVITAMIN WITH MINERALS) TABS tablet Take 1 tablet by mouth daily. 04/28/22  Yes Hosie Poisson, MD  pantoprazole (PROTONIX) 40 MG tablet Take 1 tablet (40 mg total) by mouth daily. 04/27/22 04/27/23 Yes Hosie Poisson, MD  polyethylene glycol (MIRALAX / GLYCOLAX) 17 g packet Take 17 g by mouth daily as needed for mild constipation. 05/12/22  Yes Sheikh, Omair Latif, DO  prednisoLONE 5 MG TABS tablet Take 40 mg by mouth daily. 05/14/22  Yes [provider]  predniSONE (DELTASONE) 20 MG tablet Take 40 mg by mouth daily. 04/29/22  Yes [provider]  simethicone (MYLICON) 80 MG chewable tablet Chew 1 tablet (80 mg total) by mouth every 6 (six) hours as needed for flatulence. 04/27/22  Yes Hosie Poisson, MD  spironolactone (ALDACTONE) 50 MG tablet Take 1 tablet (50 mg total) by mouth daily. 05/13/22  Yes Sheikh, Omair Latif, DO  thiamine (VITAMIN B-1) 100 MG tablet Take 1 tablet (100 mg total) by mouth daily. 04/28/22  Yes  Hosie Poisson, MD     Critical care time: 26

## 2022-06-01 NOTE — Progress Notes (Signed)
[place] for Infectious Disease  Date of Admission:  05/24/2022   Total days of inpatient antibiotics 3  Principal Problem:   Acute respiratory failure with hypoxia (HCC)          Assessment: #Listeria monocytogenes, MSSA bacteremia #Fever #C/F meningitis - Blood cultures from 11/7 showing 1/2 staph epi, MSSA, 2/2 Listeria monocytogenes.  We will do double coverage with ampicillin gentamicin administered. - Per mother at bedside patient had not been feeling well for last few weeks.  On Monday she had abdominal pain throughout the day followed by headache at night but states she was unarousable and presented to the ED.  She was intubated. - I think she would benefit from a LP given Listeria bacteremia and her history of headache along with fevers.  Unclear how long patient has been bacteremia, mother reports patient had not been feeling well since being hospitalized for alcoholic hepatitis 32/9 - 10/6.  She was discharged on steroids 40 mg daily for 28 days she was again hospitalized 10/20 - 10/21 for bilateral lower extremity edema suspected secondary to third spacing, discharged on midodrine. It is possible patient developed SBP then became bacteremic along with meningitis versus she had bacteremia due to multiple healthcare exposures and underlying cirrhosis developing SBP and bacteremia.  Cultures and studies are pending.   #Alcoholic cirrhosis with ascites SP paracentesis on 11/8 -50 cc of cloudy-yellowish fluid drained, Cx pending         Recommendations:  -D/c metronidazole -Continue ampicillin and gentamicin for Listeria bacteremia -Continue CTX for SBP(meningitis dosing) -Continue vancomycin for now, will order penicillinase testing(hopefully can use AMP) -Recommend LP with bacterial, AFB, fungal Cx, ME panel -Follow blood Cx -Follow peritoneal Cx -TTE  #Urine Cx + Ecoli - Abx as above   Dr. Baxter Flattery will be covering this weekend Microbiology:    Antibiotics: Vancomycin 11/7- Metronidazole 11/17- Cefepime 11/17-11/8  Gent 11/8- Ampicillin 11/8-   Cultures: Blood 11/7 1/2 MSSA, staph epi, listeria monocytogenes   11/8 Resp Cx pending, RVP negative Urine Cx + UTI  SUBJECTIVE: Intubated, mother at bedside Interval: Tmax of 102  Review of Systems: Review of Systems  Unable to perform ROS: Intubated     Scheduled Meds:  Chlorhexidine Gluconate Cloth  6 each Topical Q0600   docusate  100 mg Per Tube BID   famotidine  20 mg Per Tube BID   fentaNYL (SUBLIMAZE) injection  50 mcg Intravenous Once   folic acid  1 mg Per Tube Daily   furosemide  80 mg Intravenous Daily   heparin  5,000 Units Subcutaneous Q8H   lactulose  30 g Per Tube TID   mouth rinse  15 mL Mouth Rinse Q2H   polyethylene glycol  17 g Per Tube Daily   spironolactone  50 mg Per Tube Daily   thiamine  100 mg Per Tube Daily   Continuous Infusions:  amiodarone 30 mg/hr (05/31/22 2119)   ampicillin (OMNIPEN) IV 2 g (06/01/22 0323)   cefTRIAXone (ROCEPHIN)  IV Stopped (05/31/22 2329)   feeding supplement (VITAL AF 1.2 CAL) 20 mL/hr at 06/01/22 0200   fentaNYL infusion INTRAVENOUS 100 mcg/hr (06/01/22 0200)   gentamicin Stopped (06/01/22 0101)   propofol (DIPRIVAN) infusion 20 mcg/kg/min (06/01/22 0216)   vancomycin Stopped (06/01/22 0140)   PRN Meds:.acetaminophen, docusate, fentaNYL, mouth rinse, polyethylene glycol No Known Allergies  OBJECTIVE: Vitals:   06/01/22 0330 06/01/22 0345 06/01/22 0400 06/01/22 0415  BP: 122/61 130/64 128/64 129/61  Pulse: 96 (!) 104 (!) 102 (!) 106  Resp: 19 (!) 21 (!) 21 19  Temp: 99.7 F (37.6 C) 99.5 F (37.5 C) 99.5 F (37.5 C) 99.5 F (37.5 C)  TempSrc:      SpO2: 96% 97% 97% 96%  Weight:       Body mass index is 31.83 kg/m.  Physical Exam Constitutional:      Comments: Intubated and sedated.   HENT:     Head: Normocephalic and atraumatic.     Right Ear: Tympanic membrane normal.     Left Ear:  Tympanic membrane normal.     Nose: Nose normal.     Mouth/Throat:     Mouth: Mucous membranes are moist.  Eyes:     Extraocular Movements: Extraocular movements intact.     Conjunctiva/sclera: Conjunctivae normal.     Pupils: Pupils are equal, round, and reactive to light.  Cardiovascular:     Rate and Rhythm: Normal rate and regular rhythm.     Heart sounds: No murmur heard.    No friction rub. No gallop.  Pulmonary:     Effort: Pulmonary effort is normal.     Breath sounds: Normal breath sounds.  Abdominal:     General: Abdomen is flat.     Palpations: Abdomen is soft.  Musculoskeletal:        General: Normal range of motion.  Skin:    General: Skin is warm and dry.  Psychiatric:        Mood and Affect: Mood normal.       Lab Results Lab Results  Component Value Date   WBC 14.0 (H) 05/31/2022   HGB 9.8 (L) 05/31/2022   HCT 28.1 (L) 05/31/2022   MCV 109.8 (H) 05/31/2022   PLT 87 (L) 05/31/2022    Lab Results  Component Value Date   CREATININE 0.62 05/31/2022   BUN 15 05/31/2022   NA 141 05/31/2022   K 3.2 (L) 05/31/2022   CL 111 05/31/2022   CO2 22 05/31/2022    Lab Results  Component Value Date   ALT 52 (H) 05/30/2022   AST 90 (H) 05/30/2022   ALKPHOS 93 05/30/2022   BILITOT 5.0 (H) 05/30/2022        Laurice Record, [facility] 06/01/2022, 5:34 AM

## 2022-06-01 NOTE — Progress Notes (Signed)
[facility] ADULT ICU REPLACEMENT PROTOCOL   The patient does apply for the [facility] Adult ICU Electrolyte Replacment Protocol based on the criteria listed below:   1.Exclusion criteria: TCTS, ECMO, Dialysis, and Myasthenia Gravis patients 2. Is GFR >/= 30 ml/min? Yes.    Patient's GFR today is >60 3. Is SCr </= 2? Yes.   Patient's SCr is 0.56 mg/dL 4. Did SCr increase >/= 0.5 in 24 hours? No. 5.Pt's weight >40kg  Yes.   6. Abnormal electrolyte(s): K+ 2.6  7. Electrolytes replaced per protocol 8.  Call MD STAT for K+ </= 2.5, Phos </= 1, or Mag </= 1 Physician:  Dr. Sande Brothers, Janace Hoard 06/01/2022 6:58 AM

## 2022-06-01 NOTE — Progress Notes (Signed)
PHARMACY - PHYSICIAN COMMUNICATION CRITICAL VALUE ALERT - BLOOD CULTURE IDENTIFICATION (BCID)  Deanna Mcmillan is an 51 y.o. female with bacteremia and meningitis  Assessment:  MSSA bacteremia. Blood and CSF cultures show listeria. ID is following  Current antibiotics: vancomycin, Ampicillin and gentamicin  Changes to prescribed antibiotics recommended:  Patient is on recommended antibiotics - No changes needed  Results for orders placed or performed during the hospital encounter of 06/10/2022  Blood Culture ID Panel (Reflexed) (Collected: 05/30/2022  7:37 PM)  Result Value Ref Range   Enterococcus faecalis NOT DETECTED NOT DETECTED   Enterococcus Faecium NOT DETECTED NOT DETECTED   Listeria monocytogenes DETECTED (A) NOT DETECTED   Staphylococcus species DETECTED (A) NOT DETECTED   Staphylococcus aureus (BCID) DETECTED (A) NOT DETECTED   Staphylococcus epidermidis DETECTED (A) NOT DETECTED   Staphylococcus lugdunensis NOT DETECTED NOT DETECTED   Streptococcus species NOT DETECTED NOT DETECTED   Streptococcus agalactiae NOT DETECTED NOT DETECTED   Streptococcus pneumoniae NOT DETECTED NOT DETECTED   Streptococcus pyogenes NOT DETECTED NOT DETECTED   A.calcoaceticus-baumannii NOT DETECTED NOT DETECTED   Bacteroides fragilis NOT DETECTED NOT DETECTED   Enterobacterales NOT DETECTED NOT DETECTED   Enterobacter cloacae complex NOT DETECTED NOT DETECTED   Escherichia coli NOT DETECTED NOT DETECTED   Klebsiella aerogenes NOT DETECTED NOT DETECTED   Klebsiella oxytoca NOT DETECTED NOT DETECTED   Klebsiella pneumoniae NOT DETECTED NOT DETECTED   Proteus species NOT DETECTED NOT DETECTED   Salmonella species NOT DETECTED NOT DETECTED   Serratia marcescens NOT DETECTED NOT DETECTED   Haemophilus influenzae NOT DETECTED NOT DETECTED   Neisseria meningitidis NOT DETECTED NOT DETECTED   Pseudomonas aeruginosa NOT DETECTED NOT DETECTED   Stenotrophomonas maltophilia NOT DETECTED NOT DETECTED    Candida albicans NOT DETECTED NOT DETECTED   Candida auris NOT DETECTED NOT DETECTED   Candida glabrata NOT DETECTED NOT DETECTED   Candida krusei NOT DETECTED NOT DETECTED   Candida parapsilosis NOT DETECTED NOT DETECTED   Candida tropicalis NOT DETECTED NOT DETECTED   Cryptococcus neoformans/gattii NOT DETECTED NOT DETECTED   Methicillin resistance mecA/C NOT DETECTED NOT DETECTED   Meth resistant mecA/C and MREJ NOT DETECTED NOT DETECTED    Hildred Laser, PharmD Clinical Pharmacist **Pharmacist phone directory can now be found on [email or web address] ([email or web address]).  Listed under [place].

## 2022-06-01 NOTE — Progress Notes (Addendum)
[name] for Infectious Disease  Date of Admission:  06/08/2022   Total days of inpatient antibiotics 3  Principal Problem:   Acute respiratory failure with hypoxia [facility])          Assessment: 51 YF admitted with:  #Listeria monocytogenes, MSSA bacteremia #Fever #C/F meningitis #Acute encephalopathy #Hx of Etoh abuse with cirrhosis - Blood cultures from 11/7 showing 1/2 staph epi, MSSA, 2/2 Listeria monocytogenes.  We will do double coverage with ampicillin gentamicin administered. - Per mother at bedside patient had not been feeling well for last few weeks.  On Monday she had abdominal pain throughout the day followed by headache at night but states she was unarousable and presented to the ED.  She was intubated. - I think she would benefit from a LP given Listeria bacteremia and her history of headache along with fevers.  Unclear how long patient has been bacteremia, mother reports patient had not been feeling well since being hospitalized for alcoholic hepatitis 52/7 - 10/6.  She was discharged on steroids 40 mg daily for 28 days she was again hospitalized 10/20 - 10/21 for bilateral lower extremity edema suspected secondary to third spacing, discharged on midodrine. It is possible patient developed SBP then became bacteremic along with meningitis versus she had bacteremia due to multiple healthcare exposures and underlying cirrhosis developing SBP and bacteremia.  Cultures and studies are pending. -TTE poor windows, will get TEE   #Alcoholic cirrhosis with ascites SP paracentesis on 11/8 -50 cc of cloudy-yellowish fluid drained, Cx pending         Recommendations:  -Continue ampicillin and gentamicin for Listeria bacteremia -Continue CTX for SBP(meningitis dosing) -Continue vancomycin for now, will order penicillinase testing(hopefully can use AMP ) -Recommend LP(attempt with IR today_ with bacterial, AFB, fungal Cx, ME panel -Follow blood Cx from 11/8 to ensure  clearance -Follow peritoneal Cx -TEE when able  #Urine Cx + Ecoli - Abx as above Microbiology:   Antibiotics: Vancomycin 11/7- Metronidazole 11/17- Cefepime 11/17-11/8  Gent 11/8- Ampicillin 11/8-   Cultures: Blood 11/7 1/2 MSSA, staph epi, listeria monocytogenes   11/8 Resp Cx pending, RVP negative Urine Cx + Ecoli  SUBJECTIVE: Intubated, mother at bedside Interval: Tmax of 100.9  Review of Systems: Review of Systems  Unable to perform ROS: Intubated     Scheduled Meds:  Chlorhexidine Gluconate Cloth  6 each Topical Q0600   docusate  100 mg Per Tube BID   famotidine  20 mg Per Tube BID   fentaNYL (SUBLIMAZE) injection  50 mcg Intravenous Once   folic acid  1 mg Per Tube Daily   furosemide  80 mg Intravenous Daily   heparin  5,000 Units Subcutaneous Q8H   lactulose  30 g Per Tube TID   mouth rinse  15 mL Mouth Rinse Q2H   polyethylene glycol  17 g Per Tube BID   spironolactone  50 mg Per Tube Daily   thiamine  100 mg Per Tube Daily   Continuous Infusions:  amiodarone 30 mg/hr (06/01/22 0914)   ampicillin (OMNIPEN) IV 2 g (06/01/22 1350)   cefTRIAXone (ROCEPHIN)  IV 2 g (06/01/22 0846)   feeding supplement (VITAL AF 1.2 CAL) 30 mL/hr at 06/01/22 0847   fentaNYL infusion INTRAVENOUS 100 mcg/hr (06/01/22 0700)   gentamicin     propofol (DIPRIVAN) infusion 25 mcg/kg/min (06/01/22 0913)   vancomycin 1,000 mg (06/01/22 1131)   PRN Meds:.acetaminophen, docusate, fentaNYL, mouth rinse, polyethylene glycol No Known  Allergies  OBJECTIVE: Vitals:   06/01/22 0800 06/01/22 0804 06/01/22 1000 06/01/22 1206  BP: 132/67  138/66   Pulse: 99  (!) 114   Resp: (!) 22  (!) 21   Temp: 99.1 F (37.3 C)  99.1 F (37.3 C)   TempSrc:      SpO2: 97% 97% 92% 92%  Weight:       Body mass index is 33.07 kg/m.  Physical Exam Constitutional:      Comments: Intubated and sedated.   HENT:     Head: Normocephalic and atraumatic.     Right Ear: Tympanic membrane normal.      Left Ear: Tympanic membrane normal.     Nose: Nose normal.     Mouth/Throat:     Mouth: Mucous membranes are moist.  Eyes:     Extraocular Movements: Extraocular movements intact.     Conjunctiva/sclera: Conjunctivae normal.     Pupils: Pupils are equal, round, and reactive to light.  Cardiovascular:     Rate and Rhythm: Normal rate and regular rhythm.     Heart sounds: No murmur heard.    No friction rub. No gallop.  Pulmonary:     Effort: Pulmonary effort is normal.     Breath sounds: Normal breath sounds.  Abdominal:     General: Abdomen is flat.     Palpations: Abdomen is soft.  Musculoskeletal:        General: Normal range of motion.  Skin:    General: Skin is warm and dry.  Psychiatric:        Mood and Affect: Mood normal.       Lab Results Lab Results  Component Value Date   WBC 13.1 (H) 06/01/2022   HGB 9.4 (L) 06/01/2022   HCT 28.4 (L) 06/01/2022   MCV 113.1 (H) 06/01/2022   PLT 71 (L) 06/01/2022    Lab Results  Component Value Date   CREATININE 0.56 06/01/2022   BUN 16 06/01/2022   NA 144 06/01/2022   K >7.5 (HH) 06/01/2022   CL 113 (H) 06/01/2022   CO2 23 06/01/2022    Lab Results  Component Value Date   ALT 32 06/01/2022   AST 47 (H) 06/01/2022   ALKPHOS 76 06/01/2022   BILITOT 3.8 (H) 06/01/2022        Laurice Record, [place] for Infectious Disease [name] 06/01/2022, 2:59 PM

## 2022-06-01 NOTE — Progress Notes (Signed)
Pharmacy Antibiotic Note  Deanna Mcmillan is a 51 y.o. female admitted on 05/23/2022 with bacteremia/possible concern for meningitis/sepsis/SBP. Blood cultures from 11/7 showing MSSA, MSSE and listeria in 1/2 sets. Pharmacy has been consulted for gentamicin and vancomycin dosing. SCr- 0.56 with CrCl ~ 88 ml/min today.   Gent levels drawn last night showed a peak of 6.9, drawn appropriately 30 minutes after end of infusion and a trough of 4.3, drawn about 2 hours early. Predicted true trough ~ 2.9, which is still above goal.    Plan: Adjust Vancomycin to 1000 mg every 12 hours to target CNS  Recommend Vanc trough over the weekend  Ampicillin 2 gm every 4 hours  Adjust Gentamicin to 80 mg Q 12 hours (Predicted peak 3.75 (Goal 3-4) and trough 0.8 (Goal < 1) )  Ceftriaxone 2 g IV Q 12 hours  Monitor renal function /cultures/clinical status    Weight: 87.4 kg (192 lb 10.9 oz)  Temp (24hrs), Avg:100.2 F (37.9 C), Min:99.1 F (37.3 C), Max:101.3 F (38.5 C)  Recent Labs  Lab 05/30/2022 1950 06/08/2022 2138 05/30/22 0040 05/31/22 0500 05/31/22 1510 06/01/22 0138 06/01/22 0520  WBC 12.5*  --  11.4* 14.0*  --   --  13.1*  CREATININE  --  0.91 0.79 0.62  --   --  0.56  LATICACIDVEN 2.9* 5.0* 5.1*  --  2.2*  --   --   GENTTROUGH  --   --   --   --   --   --  4.3*  GENTPEAK  --   --   --   --   --  6.9  --      Estimated Creatinine Clearance: 88 mL/min (by C-G formula based on SCr of 0.56 mg/dL).    No Known Allergies  Antimicrobials this admission: vancomycin 11/7 >>  Cefepime 11/7 >> 11/8 Gent 11/8> Ampicillin 11/8> Ceftriaxone 11/8 >  Dose adjustments this admission:  11/10: Gent peak : 6.9, Gent trough: 4.3 >>Supratherapeutic>>Adjust dose to 80 mg q 12 hours    Thank you for allowing pharmacy to be a part of this patient's care.  Jimmy Footman, PharmD, BCPS, BCIDP Infectious Diseases Clinical Pharmacist Phone: [phone] 06/01/2022 8:00 AM

## 2022-06-02 ENCOUNTER — Inpatient Hospital Stay (HOSPITAL_COMMUNITY): Payer: 59

## 2022-06-02 DIAGNOSIS — J9601 Acute respiratory failure with hypoxia: Secondary | ICD-10-CM | POA: Diagnosis not present

## 2022-06-02 LAB — CBC WITH DIFFERENTIAL/PLATELET
Abs Immature Granulocytes: 0.29 10*3/uL — ABNORMAL HIGH (ref 0.00–0.07)
Basophils Absolute: 0.1 10*3/uL (ref 0.0–0.1)
Basophils Relative: 1 %
Eosinophils Absolute: 0 10*3/uL (ref 0.0–0.5)
Eosinophils Relative: 0 %
HCT: 28.3 % — ABNORMAL LOW (ref 36.0–46.0)
Hemoglobin: 9.3 g/dL — ABNORMAL LOW (ref 12.0–15.0)
Immature Granulocytes: 2 %
Lymphocytes Relative: 15 %
Lymphs Abs: 1.9 10*3/uL (ref 0.7–4.0)
MCH: 36.6 pg — ABNORMAL HIGH (ref 26.0–34.0)
MCHC: 32.9 g/dL (ref 30.0–36.0)
MCV: 111.4 fL — ABNORMAL HIGH (ref 80.0–100.0)
Monocytes Absolute: 1 10*3/uL (ref 0.1–1.0)
Monocytes Relative: 8 %
Neutro Abs: 9.1 10*3/uL — ABNORMAL HIGH (ref 1.7–7.7)
Neutrophils Relative %: 74 %
Platelets: 88 10*3/uL — ABNORMAL LOW (ref 150–400)
RBC: 2.54 MIL/uL — ABNORMAL LOW (ref 3.87–5.11)
RDW: 16.1 % — ABNORMAL HIGH (ref 11.5–15.5)
WBC: 12.4 10*3/uL — ABNORMAL HIGH (ref 4.0–10.5)
nRBC: 0.2 % (ref 0.0–0.2)

## 2022-06-02 LAB — POTASSIUM: Potassium: 4 mmol/L (ref 3.5–5.1)

## 2022-06-02 LAB — COMPREHENSIVE METABOLIC PANEL
ALT: 31 U/L (ref 0–44)
AST: 42 U/L — ABNORMAL HIGH (ref 15–41)
Albumin: 2.4 g/dL — ABNORMAL LOW (ref 3.5–5.0)
Alkaline Phosphatase: 83 U/L (ref 38–126)
Anion gap: 6 (ref 5–15)
BUN: 16 mg/dL (ref 6–20)
CO2: 24 mmol/L (ref 22–32)
Calcium: 8.7 mg/dL — ABNORMAL LOW (ref 8.9–10.3)
Chloride: 116 mmol/L — ABNORMAL HIGH (ref 98–111)
Creatinine, Ser: 0.59 mg/dL (ref 0.44–1.00)
GFR, Estimated: >60 mL/min (ref >60)
Glucose, Bld: 151 mg/dL — ABNORMAL HIGH (ref 70–99)
Potassium: 3.9 mmol/L (ref 3.5–5.1)
Sodium: 146 mmol/L — ABNORMAL HIGH (ref 135–145)
Total Bilirubin: 3.3 mg/dL — ABNORMAL HIGH (ref 0.3–1.2)
Total Protein: 5.9 g/dL — ABNORMAL LOW (ref 6.5–8.1)

## 2022-06-02 LAB — GLUCOSE, CAPILLARY
Glucose-Capillary: 161 mg/dL — ABNORMAL HIGH (ref 70–99)
Glucose-Capillary: 135 mg/dL — ABNORMAL HIGH (ref 70–99)
Glucose-Capillary: 175 mg/dL — ABNORMAL HIGH (ref 70–99)
Glucose-Capillary: 199 mg/dL — ABNORMAL HIGH (ref 70–99)
Glucose-Capillary: 193 mg/dL — ABNORMAL HIGH (ref 70–99)
Glucose-Capillary: 167 mg/dL — ABNORMAL HIGH (ref 70–99)

## 2022-06-02 LAB — MAGNESIUM: Magnesium: 2.2 mg/dL (ref 1.7–2.4)

## 2022-06-02 LAB — TRIGLYCERIDES: Triglycerides: 148 mg/dL (ref <150)

## 2022-06-02 MED ORDER — SORBITOL 70 % SOLN
960.0000 mL | TOPICAL_OIL | Freq: Two times a day (BID) | ORAL | Status: DC
  Administered 2022-06-02 (×2): 960 mL via RECTAL
  Filled 2022-06-02 (×3): qty 240

## 2022-06-02 MED ORDER — NEOSTIGMINE METHYLSULFATE 10 MG/10ML IV SOLN
0.5000 mg | Freq: Four times a day (QID) | INTRAVENOUS | Status: DC
  Administered 2022-06-02 – 2022-06-03 (×4): 0.5 mg via SUBCUTANEOUS
  Filled 2022-06-02 (×6): qty 0.5

## 2022-06-02 MED ORDER — METOCLOPRAMIDE HCL 5 MG/ML IJ SOLN
5.0000 mg | Freq: Three times a day (TID) | INTRAMUSCULAR | Status: AC
  Administered 2022-06-02 – 2022-06-03 (×3): 5 mg via INTRAVENOUS
  Filled 2022-06-02 (×3): qty 2

## 2022-06-02 MED ORDER — CEFAZOLIN SODIUM-DEXTROSE 2-4 GM/100ML-% IV SOLN
2.0000 g | Freq: Three times a day (TID) | INTRAVENOUS | Status: AC
  Administered 2022-06-02 – 2022-06-13 (×35): 2 g via INTRAVENOUS
  Filled 2022-06-02 (×40): qty 100

## 2022-06-02 MED ORDER — METOLAZONE 2.5 MG PO TABS
5.0000 mg | ORAL_TABLET | Freq: Once | ORAL | Status: AC
  Administered 2022-06-02: 5 mg
  Filled 2022-06-02: qty 2

## 2022-06-02 MED ORDER — MIDAZOLAM HCL 2 MG/2ML IJ SOLN
2.0000 mg | Freq: Once | INTRAMUSCULAR | Status: AC
  Administered 2022-06-02: 4 mg via INTRAVENOUS
  Filled 2022-06-02: qty 4

## 2022-06-02 MED ORDER — ALBUMIN HUMAN 25 % IV SOLN
25.0000 g | Freq: Four times a day (QID) | INTRAVENOUS | Status: AC
  Administered 2022-06-02 (×2): 25 g via INTRAVENOUS
  Administered 2022-06-03: 12.5 g via INTRAVENOUS
  Administered 2022-06-03: 25 g via INTRAVENOUS
  Filled 2022-06-02 (×4): qty 100

## 2022-06-02 MED ORDER — VITAL AF 1.2 CAL PO LIQD
1000.0000 mL | ORAL | Status: DC
  Administered 2022-06-02: 1000 mL

## 2022-06-02 MED ORDER — FUROSEMIDE 10 MG/ML IJ SOLN
60.0000 mg | Freq: Three times a day (TID) | INTRAMUSCULAR | Status: AC
  Administered 2022-06-02 – 2022-06-03 (×3): 60 mg via INTRAVENOUS
  Filled 2022-06-02 (×3): qty 6

## 2022-06-02 MED ORDER — METHYLNALTREXONE BROMIDE 12 MG/0.6ML ~~LOC~~ SOLN
12.0000 mg | Freq: Once | SUBCUTANEOUS | Status: AC
  Administered 2022-06-02: 12 mg via SUBCUTANEOUS
  Filled 2022-06-02: qty 0.6

## 2022-06-02 NOTE — Progress Notes (Signed)
[place] for Infectious Disease    Date of Admission:  06/10/2022   Total days of antibiotics 5   ID: Deanna Mcmillan is a 51 y.o. female with hx of ETOH cirrhosis admitted for AMS, severe sepsis with respiratory failure requiring intubation. Found to have listeria meningitis with secondary bacteremia, but also possibly polymicrobial bacteremia with MSSA c/b afib on amiodarone. RF for listeria -- recently on long course of steroids in October, hx of cirrhosis. Principal Problem:   Acute respiratory failure with hypoxia (HCC)    Subjective: Afebrile, continues to require to be intubated. Sedated, not following commands  Medications:   Chlorhexidine Gluconate Cloth  6 each Topical Q0600   docusate  100 mg Per Tube BID   famotidine  20 mg Per Tube BID   fentaNYL (SUBLIMAZE) injection  50 mcg Intravenous Once   folic acid  1 mg Per Tube Daily   furosemide  60 mg Intravenous Q8H   heparin  5,000 Units Subcutaneous Q8H   lactulose  30 g Per Tube TID   methylnaltrexone  12 mg Subcutaneous Once   metoCLOPramide (REGLAN) injection  5 mg Intravenous Q8H   neostigmine  0.5 mg Subcutaneous Q6H   mouth rinse  15 mL Mouth Rinse Q2H   polyethylene glycol  17 g Per Tube BID   sorbitol, milk of mag, mineral oil, glycerin (SMOG) enema  960 mL Rectal BID   spironolactone  50 mg Per Tube Daily   thiamine  100 mg Per Tube Daily    Objective: Vital signs in last 24 hours: Temp:  [97.5 F (36.4 C)-98.8 F (37.1 C)] 98.8 F (37.1 C) (11/11 1215) Pulse Rate:  [92-117] 111 (11/11 1215) Resp:  [19-34] 29 (11/11 1215) BP: (102-159)/(53-105) 159/87 (11/11 1215) SpO2:  [90 %-99 %] 90 % (11/11 1215) FiO2 (%):  [30 %-40 %] 40 % (11/11 1209) Weight:  [87.1 kg] 87.1 kg (11/11 0405)  Physical Exam  Constitutional:  intubated/sedated. appears older than stated age, chronically ill and well-nourished. No distress.  HENT:[place]/AT, PERRLA, no scleral icterus Mouth/Throat: OETT in  place Cardiovascular: irreg,regular rhythm and normal heart sounds. Exam reveals no gallop and no friction rub.  No murmur heard.  Pulmonary/Chest: Effort normal and breath sounds normal. No respiratory distress.  has no wheezes.  Neck = supple, no nuchal rigidity Abdominal: Soft. Bowel sounds are normal.  exhibits no distension. There is no tenderness.  Neurological: alert and oriented to person, place, and time.  Skin: Skin is warm and dry. Mark edema with scattered echymosis Psychiatric: a normal mood and affect.  behavior is normal.   Lab Results Recent Labs    06/01/22 0520 06/01/22 1252 06/01/22 1453 06/01/22 1838 06/01/22 2330 06/02/22 0453  WBC 13.1*  --   --   --   --  12.4*  HGB 9.4*  --   --   --   --  9.3*  HCT 28.4*  --   --   --   --  28.3*  NA 144  --  144  --   --  146*  K 2.7*   < > 3.5   < > 4.0 3.9  CL 113*  --  111  --   --  116*  CO2 23  --  23  --   --  24  BUN 16  --  16  --   --  16  CREATININE 0.56  --  0.64  --   --  0.59   < > = values in this interval not displayed.   Liver Panel Recent Labs    06/01/22 0520 06/02/22 0453  PROT 5.5* 5.9*  ALBUMIN 2.6* 2.4*  AST 47* 42*  ALT 32 31  ALKPHOS 76 83  BILITOT 3.8* 3.3*   Microbiology: 11/10:CSF showing wbc of 80/glu 86/ elevated protein; GPR on gram stain; PCR + listeria 11/08 peritoneal fluid : wbc of 213 with 58%N; alb <1.5, TP <3; sTP 5.9 sAlb 2.4 11/08: peritoneal cx: NGTD 11/08 blood cx NGTD 11/08: trach aspirate: MSSA 11/07: blood cx: listeria 11/07: blood cx: listeria, MSSA, MSSE  Studies/Results: DG Abd 1 View  Result Date: 06/02/2022 CLINICAL DATA:  Follow-up of ileus EXAM: ABDOMEN - 1 VIEW COMPARISON:  05/31/2022 FINDINGS: Supine and semi-erect views. Nasogastric terminates at the body of the stomach. Cardiomegaly. Paucity of upper abdominal bowel gas. No gross free intraperitoneal air in the semi-erect view. On the supine views, gas-filled bowel within the right lower abdomen and  mid abdomen measures maximally 9.4 cm. The right lower abdominal gas is likely within the cecum. The mid abdominal gas is increased from 6.6 cm and could be within the transverse colon or less likely small bowel. Distal gas identified. IMPRESSION: Probable cecal and transverse colonic gaseous distension, favoring colonic ileus. Distal obstruction is felt less likely. Electronically Signed   By: Abigail Miyamoto M.D.   On: 06/02/2022 11:51   DG FL GUIDED LUMBAR PUNCTURE  Result Date: 06/01/2022 CLINICAL DATA:  51 year old female admitted for altered mental status and fever. History of alcoholic hepatitis. EXAM: DIAGNOSTIC LUMBAR PUNCTURE UNDER FLUOROSCOPIC GUIDANCE COMPARISON:  None Available. FLUOROSCOPY: Radiation Exposure Index (as provided by the fluoroscopic device): 43.3 mGy Kerma PROCEDURE: Informed consent was obtained from the patient prior to the procedure, including potential complications of headache, allergy, and pain. With the patient prone, the lower back was prepped with Betadine. 1% Lidocaine was used for local anesthesia. Lumbar puncture was performed at the L2-3 level (designating small ribs at L1) using a 20 gauge 6 inch needle with return of amber colored CSF with an opening pressure of 15 cm water. 9 mL of CSF were obtained for laboratory studies. The patient tolerated the procedure well and there were no apparent complications. This exam was performed by Rushie Nyhan, NP, and was supervised and interpreted by Logan Bores, MD. IMPRESSION: Technically successful fluoroscopically guided lumbar puncture. Electronically Signed   By: Logan Bores M.D.   On: 06/01/2022 14:09   ECHOCARDIOGRAM COMPLETE BUBBLE STUDY  Result Date: 05/31/2022    ECHOCARDIOGRAM REPORT   Patient Name:   Deanna Mcmillan Date of Exam: 05/31/2022 Medical Rec #:  [id number]        Height:       64.0 in Accession #:    [id number]       Weight:       185.4 lb Date of Birth:  01/29/1971        BSA:          1.894 m  Patient Age:    7 years         BP:           140/60 mmHg Patient Gender: F                HR:           112 bpm. Exam Location:  Inpatient Procedure: 2D Echo, Cardiac Doppler, Color Doppler and Intracardiac  Opacification Agent Indications:    Bacteremia [790.7.ICD-9-CM]  History:        Patient has prior history of Echocardiogram examinations, most                 recent 04/23/2022.  Sonographer:    Ronny Flurry Referring Phys: [id number] [facility]  Sonographer Comments: Suboptimal apical window, Technically difficult study due to poor echo windows, suboptimal subcostal window and echo performed with patient supine and on artificial respirator. Image acquisition challenging due to respiratory motion. IMPRESSIONS  1. Poor apical and subcostal images If suspicion for SBE high consider TEE.  2. Left ventricular ejection fraction, by estimation, is 60 to 65%. The left ventricle has normal function. The left ventricle has no regional wall motion abnormalities. Left ventricular diastolic parameters were normal.  3. Right ventricular systolic function is normal. The right ventricular size is normal.  4. Left atrial size was mildly dilated.  5. The mitral valve is normal in structure. No evidence of mitral valve regurgitation. No evidence of mitral stenosis.  6. The aortic valve is tricuspid. There is mild calcification of the aortic valve. Aortic valve regurgitation is not visualized. Aortic valve sclerosis is present, with no evidence of aortic valve stenosis.  7. The inferior vena cava is normal in size with greater than 50% respiratory variability, suggesting right atrial pressure of 3 mmHg. FINDINGS  Left Ventricle: Left ventricular ejection fraction, by estimation, is 60 to 65%. The left ventricle has normal function. The left ventricle has no regional wall motion abnormalities. Definity contrast agent was given IV to delineate the left ventricular  endocardial borders. The left ventricular internal  cavity size was normal in size. There is no left ventricular hypertrophy. Left ventricular diastolic parameters were normal. Right Ventricle: The right ventricular size is normal. No increase in right ventricular wall thickness. Right ventricular systolic function is normal. Left Atrium: Left atrial size was mildly dilated. Right Atrium: Right atrial size was normal in size. Pericardium: There is no evidence of pericardial effusion. Mitral Valve: The mitral valve is normal in structure. No evidence of mitral valve regurgitation. No evidence of mitral valve stenosis. Tricuspid Valve: The tricuspid valve is normal in structure. Tricuspid valve regurgitation is mild . No evidence of tricuspid stenosis. Aortic Valve: The aortic valve is tricuspid. There is mild calcification of the aortic valve. Aortic valve regurgitation is not visualized. Aortic valve sclerosis is present, with no evidence of aortic valve stenosis. Aortic valve mean gradient measures 10.0 mmHg. Aortic valve peak gradient measures 16.0 mmHg. Aortic valve area, by VTI measures 2.86 cm. Pulmonic Valve: The pulmonic valve was normal in structure. Pulmonic valve regurgitation is trivial. No evidence of pulmonic stenosis. Aorta: The aortic root is normal in size and structure. Venous: The inferior vena cava is normal in size with greater than 50% respiratory variability, suggesting right atrial pressure of 3 mmHg. IAS/Shunts: No atrial level shunt detected by color flow Doppler. Additional Comments: Poor apical and subcostal images If suspicion for SBE high consider TEE.  LEFT VENTRICLE PLAX 2D LVIDd:         5.50 cm   Diastology LVIDs:         3.50 cm   LV e' medial:    11.70 cm/s LV PW:         1.10 cm   LV E/e' medial:  9.4 LV IVS:        0.90 cm   LV e' lateral:   15.40 cm/s LVOT diam:  2.10 cm   LV E/e' lateral: 7.1 LV SV:         87 LV SV Index:   46 LVOT Area:     3.46 cm  RIGHT VENTRICLE             IVC RV S prime:     18.80 cm/s  IVC diam:  2.30 cm LEFT ATRIUM             Index LA diam:        4.00 cm 2.11 cm/m LA Vol (A2C):   45.9 ml 24.23 ml/m LA Vol (A4C):   47.6 ml 25.13 ml/m LA Biplane Vol: 47.4 ml 25.02 ml/m  AORTIC VALVE AV Area (Vmax):    2.98 cm AV Area (Vmean):   2.54 cm AV Area (VTI):     2.86 cm AV Vmax:           200.00 cm/s AV Vmean:          147.000 cm/s AV VTI:            0.304 m AV Peak Grad:      16.0 mmHg AV Mean Grad:      10.0 mmHg LVOT Vmax:         172.33 cm/s LVOT Vmean:        108.000 cm/s LVOT VTI:          0.251 m LVOT/AV VTI ratio: 0.83  AORTA Ao Root diam: 3.10 cm Ao Asc diam:  3.00 cm MITRAL VALVE MV Area (PHT): 5.31 cm     SHUNTS MV Decel Time: 143 msec     Systemic VTI:  0.25 m MV E velocity: 110.00 cm/s  Systemic Diam: 2.10 cm MV A velocity: 103.00 cm/s MV E/A ratio:  1.07 Jenkins Rouge MD Electronically signed by Jenkins Rouge MD Signature Date/Time: 05/31/2022/2:10:03 PM    Final      Assessment/Plan:  Listeria meningitis with bacteremia = continue on ampicillin plus gentamicin. Minimum of 3 wks. Would like to continue with this regimen as long as kidneys tolerates gentamicin. Therapeutic drug monitoring labs are in place  MSSA bacteremia - only isolated in one set, however, also found to have +trach aspirate in setting of acute hypoxic respiratory failure. Spoke with micro lab that confirmed the isolate is R to PCN. Recommend to discontinue vancomycin to minimize nephrotoxicity and can switch to cefazolin  MSSE bacteremia = likely contaminant  Encephalopathy = secondary to sepsis and underlying cirrhosis. Continue with plan per pccm. Does not appear to have SBP.  Afib = still irreg, irreg on exam. Continue with amiodarone  Respiratory failure requiring vent = continues on vent support in addition to attempting to do diuresis.   [facility] for Infectious Diseases Pager: [phone]  06/02/2022, 1:24 PM

## 2022-06-02 NOTE — Progress Notes (Signed)
NAME:  Deanna Mcmillan, MRN:  [id number], DOB:  20-Nov-1970, LOS: 4 ADMISSION DATE:  06/09/2022, CONSULTATION DATE:  11/7 REFERRING MD:  Lendon Colonel, EDP CHIEF COMPLAINT:  AMS   History of Present Illness:  51 yo woman with hx of recent diagnosis of alcoholic hepatitis (dx 38/18), here with ams, fever.   History from her mother Deanna Mcmillan.  Patient was in her normal state of health, until suddenly last night she developed a headache.  She went to sleep to rest.  Today she slept throughout the day, this evening her mother went to wake her and was unable to wake her up, so she called 911.     She has been doing well since her recent hospital discharge, taking all the meds as prescribed, no new meds.  Planned follow up with GI on 11/14.  LE edema had been improving.  No cough, sob no focal symptoms.  No known sick contacts.     In ED, febrile, tachycardic, lethargic, desaturating to 70s.   Intubated by ED physician.    Had been admitted earlier in October and started on course of prenisone for alc hepatitis.    Recently seen at [facility] 10/19: LE Edema. Lasix not helpful at home.  Admitted for diuresis.  Jaundice and abd distension also noted.  Pleural effusion.  GI saw: non enough ascites for tap  Started lasix and spironolactone.  28 day course of prednisolone, miralax, lactulose, miralax. Midodrine TID    In ED got cefepime, LR 1L, flagyl ordered, intubated, started on Propofol, vanc ordered   Pertinent  Medical History  ETOH Cirrhosis BLE edema recently worsening  S/p lap appy 2014    Echo: recent normal [id number]/9371   Home meds: folic ascid, acetaminohpen, lasix 40bid, lactulose 30 TID protonix, prednisone (04/29/22), prednisolone 04/28/22 three week course, thiamine, simethicone,    Per her mother she had been drinking at least one bottle of wine daily, she isnt clear exactly how much she was drinking but she did also drink [place].  She notes she had wanted to stop recently but been unable to.   She thinks she was depressed after experiencing perimenopause.  No tobacco.   Significant Hospital Events: Including procedures, antibiotic start and stop dates in addition to other pertinent events   11/7: Intubated in ED and admitted to ICU 11/7: CTH neg, CT A/P cirrhosis with portal hypertension, splenomegaly, moderate volume ascites, moderate bilateral pleural effusion 11/9 KUB: No ileus 11/10: LP confirms listeria meningitis 11/11 KUB: ileus  Interim History / Subjective:  Still having trouble pulling fluid off. No sign improvement in bowel movements.  Objective   Blood pressure (!) 159/87, pulse (!) 111, temperature 98.8 F (37.1 C), resp. rate (!) 29, weight 87.1 kg, SpO2 90 %.    Vent Mode: PRVC FiO2 (%):  [30 %-40 %] 40 % Set Rate:  [20 bmp] 20 bmp Vt Set:  [420 IR-67893 mL] 420 mL PEEP:  [5 cmH20] 5 cmH20 Pressure Support:  [12 cmH20] 12 cmH20 Plateau Pressure:  [15 cmH20-20 cmH20] 20 cmH20   Intake/Output Summary (Last 24 hours) at 06/02/2022 1305 Last data filed at 06/02/2022 1200 Gross per 24 hour  Intake 3159.24 ml  Output 3055 ml  Net 104.24 ml    Filed Weights   05/31/22 0900 06/01/22 0500 06/02/22 0405  Weight: 84.1 kg 87.4 kg 87.1 kg    Examination: Chronically ill appearing Marked anasarca Lungs diminished, triggers vent Withdraws x 4 Not following commands, sedated Agitated/tachypneic with SAT  Abd distended hypoactive BS, tympanic  Sodium drifting up CBC stable Will check cxr tomorrow  Resolved Hospital Problem list    Assessment & Plan:  Severe sepsis secondary to (A) Listeria meningitis/bacteremia, (B) E coli UTI, (C) MSSA bacteremia (D) staph epidermidis bacteremia.  EtOH cirrhosis with ascites  Probable HE, Acute metabolic encephalopathy   Acute resp failure with hypoxemia- multifactorial from volume overload, AMS, and inability to handle infectious metabolic burden  Volume overloaded state of heart  SVT/Afib, frequent pACs-  improved with amio gtt  Colonic ileus- not helping FRC nor HE  - Abx per ID - TEE planned for Monday - Push diuresis: lasix/albumin/metolazone - Enema, subQ neostigmine, relistor, reglan, lactulose - Needs TEE complete, more fluid off, return of bowel function, and mental status imrpovement prior to considering extubation - Amio gtt for now until gut starts working - Guarded prognosis  Best Practice (right click and "Reselect all SmartList Selections" daily)   Diet/type: tubefeeds and NPO DVT prophylaxis: prophylactic heparin  GI prophylaxis: PPI Lines: N/A Foley:  Yes, and it is still needed Code Status:  full code Last date of multidisciplinary goals of care discussion [updated mother at bedside]  32 min cc time independent of procedures  Erskine Emery MD PCCM

## 2022-06-03 ENCOUNTER — Inpatient Hospital Stay (HOSPITAL_COMMUNITY): Payer: 59

## 2022-06-03 DIAGNOSIS — J9601 Acute respiratory failure with hypoxia: Secondary | ICD-10-CM | POA: Diagnosis not present

## 2022-06-03 LAB — COMPREHENSIVE METABOLIC PANEL
ALT: 26 U/L (ref 0–44)
AST: 39 U/L (ref 15–41)
Albumin: 3.3 g/dL — ABNORMAL LOW (ref 3.5–5.0)
Alkaline Phosphatase: 91 U/L (ref 38–126)
Anion gap: 12 (ref 5–15)
BUN: 17 mg/dL (ref 6–20)
CO2: 30 mmol/L (ref 22–32)
Calcium: 9.6 mg/dL (ref 8.9–10.3)
Chloride: 108 mmol/L (ref 98–111)
Creatinine, Ser: 0.55 mg/dL (ref 0.44–1.00)
GFR, Estimated: >60 mL/min (ref >60)
Glucose, Bld: 160 mg/dL — ABNORMAL HIGH (ref 70–99)
Potassium: 2.8 mmol/L — ABNORMAL LOW (ref 3.5–5.1)
Sodium: 150 mmol/L — ABNORMAL HIGH (ref 135–145)
Total Bilirubin: 3.6 mg/dL — ABNORMAL HIGH (ref 0.3–1.2)
Total Protein: 6.2 g/dL — ABNORMAL LOW (ref 6.5–8.1)

## 2022-06-03 LAB — BODY FLUID CULTURE W GRAM STAIN: Culture: NO GROWTH

## 2022-06-03 LAB — CBC
HCT: 24.7 % — ABNORMAL LOW (ref 36.0–46.0)
Hemoglobin: 8.2 g/dL — ABNORMAL LOW (ref 12.0–15.0)
MCH: 36.6 pg — ABNORMAL HIGH (ref 26.0–34.0)
MCHC: 33.2 g/dL (ref 30.0–36.0)
MCV: 110.3 fL — ABNORMAL HIGH (ref 80.0–100.0)
Platelets: 88 10*3/uL — ABNORMAL LOW (ref 150–400)
RBC: 2.24 MIL/uL — ABNORMAL LOW (ref 3.87–5.11)
RDW: 16.1 % — ABNORMAL HIGH (ref 11.5–15.5)
WBC: 8.4 10*3/uL (ref 4.0–10.5)
nRBC: 0.2 % (ref 0.0–0.2)

## 2022-06-03 LAB — GLUCOSE, CAPILLARY
Glucose-Capillary: 154 mg/dL — ABNORMAL HIGH (ref 70–99)
Glucose-Capillary: 174 mg/dL — ABNORMAL HIGH (ref 70–99)
Glucose-Capillary: 148 mg/dL — ABNORMAL HIGH (ref 70–99)
Glucose-Capillary: 122 mg/dL — ABNORMAL HIGH (ref 70–99)
Glucose-Capillary: 133 mg/dL — ABNORMAL HIGH (ref 70–99)
Glucose-Capillary: 157 mg/dL — ABNORMAL HIGH (ref 70–99)

## 2022-06-03 LAB — MAGNESIUM: Magnesium: 2.2 mg/dL (ref 1.7–2.4)

## 2022-06-03 LAB — PHOSPHORUS: Phosphorus: 2.3 mg/dL — ABNORMAL LOW (ref 2.5–4.6)

## 2022-06-03 MED ORDER — SODIUM CHLORIDE 0.9 % IV SOLN: INTRAVENOUS | Status: DC | PRN

## 2022-06-03 MED ORDER — AMIODARONE HCL 200 MG PO TABS
200.0000 mg | ORAL_TABLET | Freq: Every day | ORAL | Status: DC
  Administered 2022-06-03 – 2022-06-04 (×2): 200 mg
  Filled 2022-06-03 (×2): qty 1

## 2022-06-03 MED ORDER — METOLAZONE 2.5 MG PO TABS
5.0000 mg | ORAL_TABLET | Freq: Once | ORAL | Status: AC
  Administered 2022-06-03: 5 mg
  Filled 2022-06-03: qty 2

## 2022-06-03 MED ORDER — POTASSIUM CHLORIDE 10 MEQ/50ML IV SOLN
10.0000 meq | INTRAVENOUS | Status: AC
  Administered 2022-06-03 (×2): 10 meq via INTRAVENOUS
  Filled 2022-06-03 (×2): qty 50

## 2022-06-03 MED ORDER — FUROSEMIDE 10 MG/ML IJ SOLN
60.0000 mg | Freq: Three times a day (TID) | INTRAMUSCULAR | Status: AC
  Administered 2022-06-03 – 2022-06-04 (×3): 60 mg via INTRAVENOUS
  Filled 2022-06-03 (×3): qty 6

## 2022-06-03 MED ORDER — POTASSIUM CHLORIDE 20 MEQ PO PACK
40.0000 meq | PACK | Freq: Once | ORAL | Status: AC
  Administered 2022-06-03: 40 meq
  Filled 2022-06-03: qty 2

## 2022-06-03 MED ORDER — CHLORHEXIDINE GLUCONATE CLOTH 2 % EX PADS
6.0000 | MEDICATED_PAD | Freq: Every day | CUTANEOUS | Status: DC
  Administered 2022-06-03 – 2022-06-27 (×26): 6 via TOPICAL

## 2022-06-03 MED ORDER — POTASSIUM PHOSPHATES 15 MMOLE/5ML IV SOLN
15.0000 mmol | Freq: Once | INTRAVENOUS | Status: AC
  Administered 2022-06-03: 15 mmol via INTRAVENOUS
  Filled 2022-06-03: qty 5

## 2022-06-03 NOTE — Progress Notes (Signed)
[facility]    Date of Admission:  05/26/2022   Total days of antibiotics 6           ID: Deanna Mcmillan is a 51 y.o. 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 and secondary mssa bacteremia and acute respiratory distress in the setting of ETOH cirrhosis,  Principal Problem:   Acute respiratory failure with hypoxia (HCC)    Subjective: Afebrile, no longer requires sedation, occassional tracks me in room  Leukocytosis improving,   Medications:   amiodarone  200 mg Per Tube Daily   Chlorhexidine Gluconate Cloth  6 each Topical Daily   docusate  100 mg Per Tube BID   famotidine  20 mg Per Tube BID   fentaNYL (SUBLIMAZE) injection  50 mcg Intravenous Once   folic acid  1 mg Per Tube Daily   furosemide  60 mg Intravenous Q8H   heparin  5,000 Units Subcutaneous Q8H   lactulose  30 g Per Tube TID   mouth rinse  15 mL Mouth Rinse Q2H   polyethylene glycol  17 g Per Tube BID   spironolactone  50 mg Per Tube Daily   thiamine  100 mg Per Tube Daily    Objective: Vital signs in last 24 hours: Temp:  [96.1 F (35.6 C)-98.8 F (37.1 C)] 96.4 F (35.8 C) (11/12 1215) Pulse Rate:  [87-113] 88 (11/12 1215) Resp:  [17-37] 17 (11/12 1215) BP: (97-155)/(45-107) 104/51 (11/12 1215) SpO2:  [90 %-98 %] 95 % (11/12 1215) FiO2 (%):  [40 %] 40 % (11/12 1200) Weight:  [69.8 kg] 69.8 kg (11/12 0500) Physical Exam  Constitutional:  oriented to person, place, and time. appears well-developed and well-nourished. No distress.  HENT: OETT, jaundice, conjunctival edema Mouth/Throat: OETT in place Cardiovascular: Normal rate, regular rhythm and normal heart sounds. Exam reveals no gallop and no friction rub.  No murmur heard.  Pulmonary/Chest: Effort normal and breath sounds normal. No respiratory distress.  has no wheezes.  Neck = supple, no nuchal rigidity Abdominal: Soft. Bowel sounds are normal.  exhibits no distension. There is no tenderness.   Ext: pitting edema Neurological: alert and oriented to person, place, and time.  Skin: Skin is warm and dry. Scattered echymosis Psychiatric: a normal mood and affect.  behavior is normal.    Lab Results Recent Labs    06/02/22 0453 06/03/22 0421  WBC 12.4* 8.4  HGB 9.3* 8.2*  HCT 28.3* 24.7*  NA 146* 150*  K 3.9 2.8*  CL 116* 108  CO2 24 30  BUN 16 17  CREATININE 0.59 0.55   Liver Panel Recent Labs    06/02/22 0453 06/03/22 0421  PROT 5.9* 6.2*  ALBUMIN 2.4* 3.3*  AST 42* 39  ALT 31 26  ALKPHOS 83 91  BILITOT 3.3* 3.6*   Sedimentation Rate No results for input(s): "ESRSEDRATE" in the last 72 hours. C-Reactive Protein No results for input(s): "CRP" in the last 72 hours.  Microbiology: Csf -GPR on gram stain and PCR+ Blood cx 11/8 NGTD Studies/Results: DG Chest Port 1 View  Result Date: 06/03/2022 CLINICAL DATA:  Pleural effusion EXAM: PORTABLE CHEST 1 VIEW COMPARISON:  Previous studies including the examination of 05/30/2022 FINDINGS: Transverse diameter of heart is increased. Increased density in left mid and left lower lung fields suggest layering of small to moderate left pleural effusion. Possibility of underlying atelectasis/pneumonia is not excluded. There is increased density in right lower lung field suggesting atelectasis/pneumonia and possibly  small effusion. Overall, no significant interval changes are noted in the lung fields. Tip of endotracheal tube is 1.6 cm above the carina. Distal portion of enteric tube is seen in the stomach. Tip of right IJ central venous catheter is seen in right atrium. IMPRESSION: Bilateral pleural effusions, more so on the left side. Possible atelectasis/pneumonia in both lower lung fields. Support devices as described in the body of the report. Electronically Signed   By: Elmer Picker M.D.   On: 06/03/2022 08:46   DG Abd 1 View  Result Date: 06/02/2022 CLINICAL DATA:  Follow-up of ileus EXAM: ABDOMEN - 1 VIEW  COMPARISON:  05/31/2022 FINDINGS: Supine and semi-erect views. Nasogastric terminates at the body of the stomach. Cardiomegaly. Paucity of upper abdominal bowel gas. No gross free intraperitoneal air in the semi-erect view. On the supine views, gas-filled bowel within the right lower abdomen and mid abdomen measures maximally 9.4 cm. The right lower abdominal gas is likely within the cecum. The mid abdominal gas is increased from 6.6 cm and could be within the transverse colon or less likely small bowel. Distal gas identified. IMPRESSION: Probable cecal and transverse colonic gaseous distension, favoring colonic ileus. Distal obstruction is felt less likely. Electronically Signed   By: Abigail Miyamoto M.D.   On: 06/02/2022 11:51   DG FL GUIDED LUMBAR PUNCTURE  Result Date: 06/01/2022 CLINICAL DATA:  51 year old female admitted for altered mental status and fever. History of alcoholic hepatitis. EXAM: DIAGNOSTIC LUMBAR PUNCTURE UNDER FLUOROSCOPIC GUIDANCE COMPARISON:  None Available. FLUOROSCOPY: Radiation Exposure Index (as provided by the fluoroscopic device): 43.3 mGy Kerma PROCEDURE: Informed consent was obtained from the patient prior to the procedure, including potential complications of headache, allergy, and pain. With the patient prone, the lower back was prepped with Betadine. 1% Lidocaine was used for local anesthesia. Lumbar puncture was performed at the L2-3 level (designating small ribs at L1) using a 20 gauge 6 inch needle with return of amber colored CSF with an opening pressure of 15 cm water. 9 mL of CSF were obtained for laboratory studies. The patient tolerated the procedure well and there were no apparent complications. This exam was performed by Rushie Nyhan, NP, and was supervised and interpreted by Logan Bores, MD. IMPRESSION: Technically successful fluoroscopically guided lumbar puncture. Electronically Signed   By: Logan Bores M.D.   On: 06/01/2022 14:09      Assessment/Plan: Listeria meningitis with bacteremia = continue on ampicillin plus gentamicin. Continues to tolerate gent without nephrotoxicity. If possible to get TEE to evaluate for endocarditis-it would be helpful. Plt are improving. At minimum needs 3 wk of treatment for meningitis  MSSA bacteremia = will continue to treat with cefazolin since also found on trach aspirate to treat for pneumonia.TTE did not suggest vegetation.   Ileus = improved, continue with lactulose  Hepatic encephalopathy = reportedly slightly improved. Plan to continue with optimizing cirrhosis management  Acute respiratory distress s/p vent = continues vent support per PCCM  I have personally spent 50 minutes involved in face-to-face and non-face-to-face activities for this patient on the day of the visit. Professional time spent includes the following activities: Preparing to see the patient (review of tests), Obtaining and/or reviewing separately obtained history (admission/discharge record), Performing a medically appropriate examination and/or evaluation , Ordering medications/tests/procedures, referring and communicating with other health care professionals, Documenting clinical information in the EMR, Independently interpreting results (not separately reported), Communicating results to the patient/family/caregiver, Counseling and educating the patient/family/caregiver and Care coordination (not separately reported).  [facility] Pager: [phone]  06/03/2022, 1:11 PM

## 2022-06-03 NOTE — Progress Notes (Signed)
NAME:  Deanna Mcmillan, MRN:  [id number], DOB:  [birth year], LOS: 5 ADMISSION DATE:  06/07/2022, CONSULTATION DATE:  11/7 REFERRING MD:  Lendon Colonel, EDP CHIEF COMPLAINT:  AMS   History of Present Illness:  51 yo woman with hx of recent diagnosis of alcoholic hepatitis (dx 35/46), here with ams, fever.   History from her mother Deanna Mcmillan.  Patient was in her normal state of health, until suddenly last night she developed a headache.  She went to sleep to rest.  Today she slept throughout the day, this evening her mother went to wake her and was unable to wake her up, so she called 911.     She has been doing well since her recent hospital discharge, taking all the meds as prescribed, no new meds.  Planned follow up with GI on 11/14.  LE edema had been improving.  No cough, sob no focal symptoms.  No known sick contacts.     In ED, febrile, tachycardic, lethargic, desaturating to 70s.   Intubated by ED physician.    Had been admitted earlier in October and started on course of prenisone for alc hepatitis.    Recently seen at [facility] 10/19: LE Edema. Lasix not helpful at home.  Admitted for diuresis.  Jaundice and abd distension also noted.  Pleural effusion.  GI saw: non enough ascites for tap  Started lasix and spironolactone.  28 day course of prednisolone, miralax, lactulose, miralax. Midodrine TID    In ED got cefepime, LR 1L, flagyl ordered, intubated, started on Propofol, vanc ordered   Pertinent  Medical History  ETOH Cirrhosis BLE edema recently worsening  S/p lap appy 2014    Echo: recent normal 56/8127   Home meds: folic ascid, acetaminohpen, lasix 40bid, lactulose 30 TID protonix, prednisone (04/29/22), prednisolone 04/28/22 three week course, thiamine, simethicone,    Per her mother she had been drinking at least one bottle of wine daily, she isnt clear exactly how much she was drinking but she did also drink [place].  She notes she had wanted to stop recently but been unable to.   She thinks she was depressed after experiencing perimenopause.  No tobacco.   Significant Hospital Events: Including procedures, antibiotic start and stop dates in addition to other pertinent events   11/7: Intubated in ED and admitted to ICU 11/7: CTH neg, CT A/P cirrhosis with portal hypertension, splenomegaly, moderate volume ascites, moderate bilateral pleural effusion 11/9 KUB: No ileus 11/10: LP confirms listeria meningitis 11/11 KUB: ileus 11/12 ileus resolved with aggressive bowel regimen  Interim History / Subjective:  Good BM finally Also able to start diuresing. More awake.  Objective   Blood pressure (!) 107/51, pulse 87, temperature (!) 96.1 F (35.6 C), resp. rate 18, weight 69.8 kg, SpO2 96 %.    Vent Mode: PSV;CPAP FiO2 (%):  [40 %] 40 % Set Rate:  [20 bmp] 20 bmp Vt Set:  [420 mL] 420 mL PEEP:  [5 cmH20] 5 cmH20 Pressure Support:  [12 cmH20] 12 cmH20 Plateau Pressure:  [15 cmH20-20 cmH20] 17 cmH20   Intake/Output Summary (Last 24 hours) at 06/03/2022 1139 Last data filed at 06/03/2022 1010 Gross per 24 hour  Intake 2734.58 ml  Output 6749 ml  Net -4014.42 ml    Filed Weights   06/01/22 0500 06/02/22 0405 06/03/22 0500  Weight: 87.4 kg 87.1 kg 69.8 kg    Examination: Chronically ill appearing Anasarca mildly improved Lungs diminished, triggers vent Withdraws x 4 Follows commands! Abd less  distended  Sodium up with loop diuretic CXR layering left effusion  Resolved Hospital Problem list    Assessment & Plan:  Severe sepsis secondary to (A) Listeria meningitis/bacteremia, (B) E coli UTI, (C) MSSA bacteremia (D) staph epidermidis bacteremia.  EtOH cirrhosis with ascites, pleural effusions, thrombocytopenia  Probable HE, Acute metabolic encephalopathy - improving finally  Acute resp failure with hypoxemia- multifactorial from volume overload, AMS, and inability to handle infectious metabolic burden  Volume overloaded state of heart-  tolerating diuresis so far  SVT/Afib, frequent pACs- improved with amio gtt  Colonic ileus- improved  - Prolonged abx per ID - TEE planned for Monday - Another day of aggressive diuresis - Back off on bowel regimen, continue lactulose - Needs TEE complete, more fluid off  prior to considering extubation: maybe tomorrow? - Amio to per tube - I'm still not convinced this is SVT, will get another EKG tomorrow and decide if we need to consider [facility]  Best Practice (right click and "Reselect all SmartList Selections" daily)   Diet/type: tubefeeds and NPO DVT prophylaxis: prophylactic heparin  GI prophylaxis: PPI Lines: N/A Foley:  Yes, and it is still needed Code Status:  full code Last date of multidisciplinary goals of care discussion [updated mother at bedside]  34 min cc time independent of procedures  Erskine Emery MD PCCM

## 2022-06-03 NOTE — Progress Notes (Signed)
[facility] ADULT ICU REPLACEMENT PROTOCOL   The patient does apply for the [facility] Adult ICU Electrolyte Replacment Protocol based on the criteria listed below:   1.Exclusion criteria: TCTS, ECMO, Dialysis, and Myasthenia Gravis patients 2. Is GFR >/= 30 ml/min? Yes.    Patient's GFR today is >60 3. Is SCr </= 2? Yes.   Patient's SCr is 0.55 mg/dL 4. Did SCr increase >/= 0.5 in 24 hours? No. 5.Pt's weight >40kg  Yes.   6. Abnormal electrolyte(s): Phos, K  7. Electrolytes replaced per protocol 8.  Call MD STAT for K+ </= 2.5, Phos </= 1, or Mag </= 1 Physician:  Gray Bernhardt Brent Taillon 06/03/2022 5:20 AM

## 2022-06-04 ENCOUNTER — Inpatient Hospital Stay (HOSPITAL_COMMUNITY): Payer: 59

## 2022-06-04 DIAGNOSIS — J9601 Acute respiratory failure with hypoxia: Secondary | ICD-10-CM | POA: Diagnosis not present

## 2022-06-04 LAB — COMPREHENSIVE METABOLIC PANEL
ALT: 23 U/L (ref 0–44)
AST: 52 U/L — ABNORMAL HIGH (ref 15–41)
Albumin: 3.3 g/dL — ABNORMAL LOW (ref 3.5–5.0)
Alkaline Phosphatase: 103 U/L (ref 38–126)
Anion gap: 14 (ref 5–15)
BUN: 19 mg/dL (ref 6–20)
CO2: 31 mmol/L (ref 22–32)
Calcium: 10 mg/dL (ref 8.9–10.3)
Chloride: 103 mmol/L (ref 98–111)
Creatinine, Ser: 0.58 mg/dL (ref 0.44–1.00)
GFR, Estimated: >60 mL/min (ref >60)
Glucose, Bld: 214 mg/dL — ABNORMAL HIGH (ref 70–99)
Potassium: 4.1 mmol/L (ref 3.5–5.1)
Sodium: 148 mmol/L — ABNORMAL HIGH (ref 135–145)
Total Bilirubin: 3.7 mg/dL — ABNORMAL HIGH (ref 0.3–1.2)
Total Protein: 6.5 g/dL (ref 6.5–8.1)

## 2022-06-04 LAB — CBC
HCT: 26.8 % — ABNORMAL LOW (ref 36.0–46.0)
Hemoglobin: 8.8 g/dL — ABNORMAL LOW (ref 12.0–15.0)
MCH: 36.8 pg — ABNORMAL HIGH (ref 26.0–34.0)
MCHC: 32.8 g/dL (ref 30.0–36.0)
MCV: 112.1 fL — ABNORMAL HIGH (ref 80.0–100.0)
Platelets: 125 10*3/uL — ABNORMAL LOW (ref 150–400)
RBC: 2.39 MIL/uL — ABNORMAL LOW (ref 3.87–5.11)
RDW: 16.5 % — ABNORMAL HIGH (ref 11.5–15.5)
WBC: 9.6 10*3/uL (ref 4.0–10.5)
nRBC: 0.8 % — ABNORMAL HIGH (ref 0.0–0.2)

## 2022-06-04 LAB — MAGNESIUM: Magnesium: 2.1 mg/dL (ref 1.7–2.4)

## 2022-06-04 LAB — CSF CULTURE W GRAM STAIN: Culture: NO GROWTH

## 2022-06-04 LAB — GLUCOSE, CAPILLARY
Glucose-Capillary: 136 mg/dL — ABNORMAL HIGH (ref 70–99)
Glucose-Capillary: 149 mg/dL — ABNORMAL HIGH (ref 70–99)
Glucose-Capillary: 142 mg/dL — ABNORMAL HIGH (ref 70–99)
Glucose-Capillary: 152 mg/dL — ABNORMAL HIGH (ref 70–99)
Glucose-Capillary: 147 mg/dL — ABNORMAL HIGH (ref 70–99)
Glucose-Capillary: 171 mg/dL — ABNORMAL HIGH (ref 70–99)

## 2022-06-04 LAB — CULTURE, BLOOD (ROUTINE X 2)
Culture: NO GROWTH
Culture: NO GROWTH
Special Requests: ADEQUATE

## 2022-06-04 LAB — GENTAMICIN LEVEL, PEAK: Gentamicin Pk: 5.2 ug/mL (ref 5.0–10.0)

## 2022-06-04 LAB — PHOSPHORUS: Phosphorus: 7.3 mg/dL — ABNORMAL HIGH (ref 2.5–4.6)

## 2022-06-04 LAB — AMMONIA: Ammonia: 24 umol/L (ref 9–35)

## 2022-06-04 MED ORDER — FUROSEMIDE 10 MG/ML IJ SOLN
80.0000 mg | Freq: Two times a day (BID) | INTRAMUSCULAR | Status: DC
  Administered 2022-06-04 – 2022-06-07 (×7): 80 mg via INTRAVENOUS
  Filled 2022-06-04 (×7): qty 8

## 2022-06-04 MED ORDER — VITAL AF 1.2 CAL PO LIQD
1000.0000 mL | ORAL | Status: DC
  Administered 2022-06-04 – 2022-06-05 (×2): 1000 mL

## 2022-06-04 MED ORDER — POLYETHYLENE GLYCOL 3350 17 G PO PACK: 17.0000 g | PACK | Freq: Every day | ORAL | Status: DC

## 2022-06-04 MED ORDER — POTASSIUM CHLORIDE 20 MEQ PO PACK
40.0000 meq | PACK | Freq: Two times a day (BID) | ORAL | Status: AC
  Administered 2022-06-04 (×2): 40 meq
  Filled 2022-06-04 (×2): qty 2

## 2022-06-04 NOTE — Progress Notes (Signed)
Pts daughter at [facility] and very concerned about patients red sclera. R eye worse than L eye

## 2022-06-04 NOTE — Progress Notes (Signed)
NAME:  Deanna Mcmillan, MRN:  [id number], DOB:  1971/01/27, LOS: 6 ADMISSION DATE:  06/13/2022, CONSULTATION DATE:  11/7 REFERRING MD:  Deanna Mcmillan, EDP CHIEF COMPLAINT:  AMS   History of Present Illness:  51 yo woman with hx of recent diagnosis of alcoholic hepatitis (dx 43/32), here with ams, fever.   History from her mother Deanna Mcmillan.  Patient was in her normal state of health, until suddenly last night she developed a headache.  She went to sleep to rest.  Today she slept throughout the day, this evening her mother went to wake her and was unable to wake her up, so she called 911.     She has been doing well since her recent hospital discharge, taking all the meds as prescribed, no new meds.  Planned follow up with GI on 11/14.  LE edema had been improving.  No cough, sob no focal symptoms.  No known sick contacts.     In ED, febrile, tachycardic, lethargic, desaturating to 70s.   Intubated by ED physician.    Had been admitted earlier in October and started on course of prenisone for alc hepatitis.    Recently seen at [facility] 10/19: LE Edema. Lasix not helpful at home.  Admitted for diuresis.  Jaundice and abd distension also noted.  Pleural effusion.  GI saw: non enough ascites for tap  Started lasix and spironolactone.  28 day course of prednisolone, miralax, lactulose, miralax. Midodrine TID    In ED got cefepime, LR 1L, flagyl ordered, intubated, started on Propofol, vanc ordered   Pertinent  Medical History  ETOH Cirrhosis BLE edema recently worsening  S/p lap appy 2014    Echo: recent normal 95/1884   Home meds: folic ascid, acetaminohpen, lasix 40bid, lactulose 30 TID protonix, prednisone (04/29/22), prednisolone 04/28/22 three week course, thiamine, simethicone,    Per her mother she had been drinking at least one bottle of wine daily, she isnt clear exactly how much she was drinking but she did also drink [place].  She notes she had wanted to stop recently but been unable to.   She thinks she was depressed after experiencing perimenopause.  No tobacco.   Significant Hospital Events: Including procedures, antibiotic start and stop dates in addition to other pertinent events   11/7: Intubated in ED and admitted to ICU 11/7: CTH neg, CT A/P cirrhosis with portal hypertension, splenomegaly, moderate volume ascites, moderate bilateral pleural effusion 11/9 KUB: No ileus 11/10: LP confirms listeria meningitis 11/11 KUB: ileus 11/12 ileus resolved with aggressive bowel regimen  Interim History / Subjective:  No acute events overnight Patient on light sedation, eyes open and tracking this morning Multiple BMs overnight Low-grade fever overnight  Objective   Blood pressure (!) 104/54, pulse 95, temperature 98.6 F (37 C), resp. rate 20, weight 75.2 kg, SpO2 96 %.    Vent Mode: PRVC FiO2 (%):  [40 %] 40 % Set Rate:  [20 bmp] 20 bmp Vt Set:  [420 mL] 420 mL PEEP:  [5 cmH20] 5 cmH20 Pressure Support:  [10 cmH20] 10 cmH20 Plateau Pressure:  [13 cmH20-18 cmH20] 16 cmH20   Intake/Output Summary (Last 24 hours) at 06/04/2022 0724 Last data filed at 06/04/2022 1660 Gross per 24 hour  Intake 1911.84 ml  Output 4190 ml  Net -2278.16 ml   Filed Weights   06/02/22 0405 06/03/22 0500 06/04/22 0500  Weight: 87.1 kg 69.8 kg 75.2 kg    Examination: General: Acutely ill middle-aged woman laying in bed. NAD.  HENT: [place]/AT. ET tube in place. Scleral icterus. Slight conjunctival edema now with conjunctiva injection worse on the right eye Lungs: Mechanical lung sounds. Decreased breath sounds anteriorly.  Cardiovascular: Tachycardic. Regular rhythm.  2+ pitting edema in both feet, improving. Abdomen: Less tight and distended today.  Normal bowel sounds. Extremities: Well perfused. Warm and dry. Mild erythema, blanchable on both foot.  Skin: Multiple bruises on the upper extremities. Neuro: Lightly sedated but awake and tracking.  Still not following commands but moving  all extremities. GU: Foley in place  Phos 7.3, mag 2.1, sodium improved to 148, K+ 4.1, creatinine 0.58. WBC 9.6, Hgb 8.8, platelet 125 CBGs 130s to 150s.  Resolved Hospital Problem list    Assessment & Plan:   Severe sepsis Acute metabolic encephalopathy Listeria, MSSA bacteremia Listeria meningitis Continues to be on broad-spectrum antibiotics.  Repeat blood culture on 11/8 no growth after 5 days. Leukocytosis has resolved. Still with intermittent fevers.  Cardiology unable to do TEE today due to inability to pass probe through ET tube. -ID following, appreciate recs -Continue Ampicillin, gentamicin and Ancef -Wean down sedation as able -Trend CBC/fever curve  -Pending TEE after extubation   Acute hypoxic respiratory failure CAP versus aspiration pneumonia Compressive atelectasis Patient remains on full vent support.  Tolerated weaning for multiple hours yesterday.  Per cardiology, patient will need to be extubated before TEE can be done. -Continue full vent support -SBT today -Continue aggressive diuresing -Continue antibiotics as above  Fluid overloaded TTE during this admission unremarkable.  Had over 4 L of UOP with aggressive IV diuresing.  Still net +3 L since admission. We will continue to diurese aggressively to help get off vent. -Repeat IV Lasix 80 mg twice daily today -Monitor BMP/UOP -Strict INO's/daily weights  Alcoholic cirrhosis Hepatic encephalopathy? Recently diagnosed cirrhosis during last hospitalization.  Concern for possible hepatic encephalopathy and possible SBP on admission. MELD Na score of 17. Child Pugh Class C. FIB-4 score of 7.46 indicates advance fibrosis . Peritoneal labs not consistent with SBP, however, patient received abx prior to tap.  Patient with multiple BMs overnight.  Mental status seems to be improving. -Continue IV Lasix as above -Continue home spironolactone -Cont thiamine, folate., MVI -Continue lactulose, goal 2-3  BM  Ileus Resolved. Patient with multiple bowel movements overnight.  Now with watery stools. -Continue MiraLAX, Colace -Continue lactulose 30 mg TID  SVT A-fib? Sinus tach Patient found to have SVT on 11/8 requiring amnio bolus and infusion. Repeat EKG today shows sinus tach with prolonged QTc.  No electrolyte abnormalities. -Discontinue amiodarone -Trend and replete electrolytes -Repeat EKG in a.m.  Hypokalemia, resolved Hypophosphatemia, resolved. K+ 4.1, mag 2.1, phos 7.3 -Daily BMP -Trend electrolytes  Best Practice (right click and "Reselect all SmartList Selections" daily)   Diet/type: tubefeeds and NPO DVT prophylaxis: prophylactic heparin  GI prophylaxis: PPI Lines: N/A Foley:  Yes, and it is still needed Code Status:  full code Last date of multidisciplinary goals of care discussion [updated mother at bedside]  Labs   CBC: Recent Labs  Lab 06/05/2022 1950 06/04/2022 2028 05/31/22 0500 06/01/22 0520 06/02/22 0453 06/03/22 0421 06/04/22 0329  WBC 12.5*   < > 14.0* 13.1* 12.4* 8.4 9.6  NEUTROABS 11.2*  --  12.3* 11.0* 9.1*  --   --   HGB 11.8*   < > 9.8* 9.4* 9.3* 8.2* 8.8*  HCT 34.3*   < > 28.1* 28.4* 28.3* 24.7* 26.8*  MCV 110.6*   < > 109.8* 113.1* 111.4* 110.3* 112.1*  PLT 111*   < > 87* 71* 88* 88* 125*   < > = values in this interval not displayed.    Basic Metabolic Panel: Recent Labs  Lab 05/30/22 0040 05/30/22 0453 05/31/22 0500 06/01/22 0520 06/01/22 1252 06/01/22 1453 06/01/22 1838 06/01/22 2330 06/02/22 0453 06/03/22 0421 06/04/22 0329  NA 140   < > 141 144  --  144  --   --  146* 150* 148*  K 4.0   < > 3.2* 2.7*   < > 3.5 4.0 4.0 3.9 2.8* 4.1  CL 108  --  111 113*  --  111  --   --  116* 108 103  CO2 19*  --  22 23  --  23  --   --  24 30 31   GLUCOSE 183*  --  173* 171*  --  179*  --   --  151* 160* 214*  BUN 18  --  15 16  --  16  --   --  16 17 19   CREATININE 0.79  --  0.62 0.56  --  0.64  --   --  0.59 0.55 0.58  CALCIUM 8.7*   --  8.7* 8.3*  --  8.3*  --   --  8.7* 9.6 10.0  MG 2.0  --  2.4 2.3  --   --   --   --  2.2 2.2 2.1  PHOS 3.6  --  1.7* 2.6  --   --   --   --   --  2.3* 7.3*   < > = values in this interval not displayed.   GFR: Estimated Creatinine Clearance: 81.7 mL/min (by C-G formula based on SCr of 0.58 mg/dL). Recent Labs  Lab 06/01/2022 1950 05/28/2022 2138 05/30/22 0040 05/31/22 0500 05/31/22 1510 06/01/22 0520 06/02/22 0453 06/03/22 0421 06/04/22 0329  PROCALCITON  --  1.31 2.25 2.93  --   --   --   --   --   WBC 12.5*  --  11.4* 14.0*  --  13.1* 12.4* 8.4 9.6  LATICACIDVEN 2.9* 5.0* 5.1*  --  2.2*  --   --   --   --     Liver Function Tests: Recent Labs  Lab 05/30/22 0040 06/01/22 0520 06/02/22 0453 06/03/22 0421 06/04/22 0329  AST 90* 47* 42* 39 52*  ALT 52* 32 31 26 23   ALKPHOS 93 76 83 91 103  BILITOT 5.0* 3.8* 3.3* 3.6* 3.7*  PROT 5.6* 5.5* 5.9* 6.2* 6.5  ALBUMIN 2.3* 2.6* 2.4* 3.3* 3.3*   No results for input(s): "LIPASE", "AMYLASE" in the last 168 hours. Recent Labs  Lab 06/20/2022 1937 05/30/22 0040 06/04/22 0329  AMMONIA 91* 58* 24    ABG    Component Value Date/Time   PHART 7.379 05/30/2022 0453   PCO2ART 34.3 05/30/2022 0453   PO2ART 90 05/30/2022 0453   HCO3 19.9 (L) 05/30/2022 0453   TCO2 21 (L) 05/30/2022 0453   ACIDBASEDEF 4.0 (H) 05/30/2022 0453   O2SAT 96 05/30/2022 0453     Coagulation Profile: Recent Labs  Lab 06/04/2022 1950 05/30/22 0040  INR 1.4* 1.5*    Cardiac Enzymes: No results for input(s): "CKTOTAL", "CKMB", "CKMBINDEX", "TROPONINI" in the last 168 hours.  HbA1C: Hgb A1c MFr Bld  Date/Time Value Ref Range Status  04/25/2022 03:41 AM <4.2 (L) 4.8 - 5.6 % Final    Comment:    (NOTE) **Verified by repeat analysis**  Prediabetes: 5.7 - 6.4         Diabetes: >6.4         Glycemic control for adults with diabetes: <7.0     CBG: Recent Labs  Lab 06/03/22 1119 06/03/22 1518 06/03/22 1921 06/03/22 2314 06/04/22 0321   GLUCAP 148* 122* 133* 157* 136*    Review of Systems:   As in HPI  Past Medical History:  She,  has no past medical history on file.   Surgical History:   Past Surgical History:  Procedure Laterality Date   KNEE ARTHROSCOPY W/ ACL RECONSTRUCTION Left 1994/5   Dr. Weston Anna   LAPAROSCOPIC APPENDECTOMY N/A 04/11/2013   Procedure: APPENDECTOMY LAPAROSCOPIC;  Surgeon: Stark Klein, MD;  Location: [facility];  Service: General;  Laterality: N/A;   [place]   3 extractions     Social History:   reports that she has never smoked. She does not have any smokeless tobacco history on file. She reports current alcohol use. She reports that she does not use drugs.   Family History:  Her family history is not on file.   Allergies No Known Allergies   Home Medications  Prior to Admission medications   Medication Sig Start Date End Date Taking? Authorizing Provider  acetaminophen (TYLENOL) 325 MG tablet Take 650 mg by mouth every 6 (six) hours as needed for headache or moderate pain.   Yes [provider]  folic acid (FOLVITE) 1 MG tablet Take 1 tablet (1 mg total) by mouth daily. 04/28/22  Yes Hosie Poisson, MD  furosemide (LASIX) 40 MG tablet Take 1 tablet (40 mg total) by mouth daily. 05/12/22 05/12/23 Yes Sheikh, Omair Latif, DO  ibuprofen (ADVIL) 200 MG tablet Take 400 mg by mouth every 6 (six) hours as needed for headache or moderate pain.   Yes [provider]  KLOR-CON M20 20 MEQ tablet Take 20 mEq by mouth daily. 05/14/22  Yes [provider]  lactulose (CHRONULAC) 10 GM/15ML solution Take 30 mLs (20 g total) by mouth 3 (three) times daily. 04/27/22  Yes Hosie Poisson, MD  Multiple Vitamin (MULTIVITAMIN WITH MINERALS) TABS tablet Take 1 tablet by mouth daily. 04/28/22  Yes Hosie Poisson, MD  pantoprazole (PROTONIX) 40 MG tablet Take 1 tablet (40 mg total) by mouth daily. 04/27/22 04/27/23 Yes Hosie Poisson, MD  polyethylene glycol (MIRALAX  / GLYCOLAX) 17 g packet Take 17 g by mouth daily as needed for mild constipation. 05/12/22  Yes Sheikh, Omair Latif, DO  prednisoLONE 5 MG TABS tablet Take 40 mg by mouth daily. 05/14/22  Yes [provider]  predniSONE (DELTASONE) 20 MG tablet Take 40 mg by mouth daily. 04/29/22  Yes [provider]  simethicone (MYLICON) 80 MG chewable tablet Chew 1 tablet (80 mg total) by mouth every 6 (six) hours as needed for flatulence. 04/27/22  Yes Hosie Poisson, MD  spironolactone (ALDACTONE) 50 MG tablet Take 1 tablet (50 mg total) by mouth daily. 05/13/22  Yes Sheikh, Omair Latif, DO  thiamine (VITAMIN B-1) 100 MG tablet Take 1 tablet (100 mg total) by mouth daily. 04/28/22  Yes Hosie Poisson, MD     Critical care time: 46

## 2022-06-04 NOTE — Progress Notes (Signed)
Deanna Mcmillan for Infectious Disease  Date of Admission:  05/28/2022   Total days of inpatient antibiotics 3  Principal Problem:   Acute respiratory failure with hypoxia (HCC)          Assessment: 51 YF admitted with:  #Listeria monocytogenes, MSSA bacteremia #Listeria meningitis #Fever #Acute encephalopathy #Hx of Etoh abuse with cirrhosis #Tracheal aspirates+ MSSA - Blood cultures from 11/7 showing 1/2 staph epi(contaminant), MSSA, 2/2 Listeria monocytogenes.  We will do double coverage with ampicillin gentamicin administered. 11/8 Blood Cx NG.  - Per mother at bedside patient had not been feeling well for last few weeks.  On Monday she had abdominal pain throughout the day followed by headache at night but states she was unarousable and presented to the ED.  She was intubated. - I think she would benefit from a LP given Listeria bacteremia and her history of headache along with fevers.  Unclear how long patient has been bacteremia, mother reports patient had not been feeling well since being hospitalized for alcoholic hepatitis 69/6 - 10/6.  She was discharged on steroids 40 mg daily for 28 days she was again hospitalized 10/20 - 10/21 for bilateral lower extremity edema suspected secondary to third spacing, discharged on midodrine. It is possible patient developed SBP then became bacteremic along with meningitis versus she had bacteremia due to multiple healthcare exposures and underlying cirrhosis developing SBP and bacteremia.  Cultures and studies are pending. -TTE poor windows, will get TEE  -CTX stopped as peritoneal fluid with no growth  - LP on 11/10 with ME panel+ listeria, CSF Cx NG, AFB cx pending. Cell count rbc 26.4K, wbc 80(37N, 53%L, 9% mono, 1% eos), >600 PTN, 86 GLC #Alcoholic cirrhosis with ascites SP paracentesis on 11/8 -50 cc of cloudy-yellowish fluid drained, Cx pending    Recommendations:  -Continue ampicillin and gentamicin for Listeria  bacteremia/meningitis -Cefazolin added for MSSA bacteremia/possible PNA -TEE after extubation per cards  #Urine Cx + Ecoli - Abx as above Microbiology:   Antibiotics: Vancomycin 11/7- Metronidazole 11/17- Cefepime 11/17-11/8  Gent 11/8- Ampicillin 11/8-   Cultures: Blood 11/7 1/2 MSSA, staph epi, listeria monocytogenes   11/8 Resp Cx pending, RVP negative Urine Cx + Ecoli  SUBJECTIVE: Intubated, mother at bedside Interval: Tmax of 100.4, wbc 9.6  Review of Systems: Review of Systems  Unable to perform ROS: Intubated  All other systems reviewed and are negative.    Scheduled Meds:  Chlorhexidine Gluconate Cloth  6 each Topical Daily   docusate  100 mg Per Tube BID   famotidine  20 mg Per Tube BID   fentaNYL (SUBLIMAZE) injection  50 mcg Intravenous Once   folic acid  1 mg Per Tube Daily   furosemide  80 mg Intravenous BID   heparin  5,000 Units Subcutaneous Q8H   lactulose  30 g Per Tube TID   mouth rinse  15 mL Mouth Rinse Q2H   polyethylene glycol  17 g Per Tube Daily   potassium chloride  40 mEq Per Tube BID   spironolactone  50 mg Per Tube Daily   thiamine  100 mg Per Tube Daily   Continuous Infusions:  sodium chloride Stopped (06/04/22 0550)   ampicillin (OMNIPEN) IV 2 g (06/04/22 1228)    ceFAZolin (ANCEF) IV Stopped (06/04/22 0704)   feeding supplement (VITAL AF 1.2 CAL) 50 mL/hr at 06/04/22 1200   fentaNYL infusion INTRAVENOUS 50 mcg/hr (06/04/22 1200)   gentamicin Stopped (06/04/22 0622)  propofol (DIPRIVAN) infusion Stopped (06/02/22 1448)   PRN Meds:.sodium chloride, acetaminophen, docusate, fentaNYL, mouth rinse, polyethylene glycol No Known Allergies  OBJECTIVE: Vitals:   06/04/22 0923 06/04/22 1000 06/04/22 1100 06/04/22 1124  BP: 135/68 123/62 132/68 (!) 135/58  Pulse: (!) 106 (!) 110 (!) 114 (!) 112  Resp: (!) 24 (!) 29 (!) 30 (!) 24  Temp: 99.3 F (37.4 C) 99.7 F (37.6 C) 100.2 F (37.9 C) (!) 100.4 F (38 C)  TempSrc:       SpO2: 94% 95% 96% 95%  Weight:       Body mass index is 28.46 kg/m.  Physical Exam Constitutional:      Comments: Intubated and sedated.   HENT:     Head: Normocephalic and atraumatic.     Right Ear: Tympanic membrane normal.     Left Ear: Tympanic membrane normal.     Nose: Nose normal.     Mouth/Throat:     Mouth: Mucous membranes are moist.  Eyes:     Extraocular Movements: Extraocular movements intact.     Conjunctiva/sclera: Conjunctivae normal.     Pupils: Pupils are equal, round, and reactive to light.  Cardiovascular:     Rate and Rhythm: Normal rate and regular rhythm.     Heart sounds: No murmur heard.    No friction rub. No gallop.  Pulmonary:     Effort: Pulmonary effort is normal.     Breath sounds: Normal breath sounds.  Abdominal:     General: Abdomen is flat.     Palpations: Abdomen is soft.  Musculoskeletal:        General: Normal range of motion.  Skin:    General: Skin is warm and dry.  Psychiatric:        Mood and Affect: Mood normal.       Lab Results Lab Results  Component Value Date   WBC 9.6 06/04/2022   HGB 8.8 (L) 06/04/2022   HCT 26.8 (L) 06/04/2022   MCV 112.1 (H) 06/04/2022   PLT 125 (L) 06/04/2022    Lab Results  Component Value Date   CREATININE 0.58 06/04/2022   BUN 19 06/04/2022   NA 148 (H) 06/04/2022   K 4.1 06/04/2022   CL 103 06/04/2022   CO2 31 06/04/2022    Lab Results  Component Value Date   ALT 23 06/04/2022   AST 52 (H) 06/04/2022   ALKPHOS 103 06/04/2022   BILITOT 3.7 (H) 06/04/2022        Laurice Record, [facility] 06/04/2022, 1:08 PM

## 2022-06-04 NOTE — Progress Notes (Signed)
Per MAR pt Tube Feeds were set to a rate of 21ml/hr with instructions to increase TF rate 59ml/hr every 8hrs to goal rate of 59ml/hr.   When this RN came on shift, TF were infusing at 50ml/hr however, previous documentation does not match. From 1900-0000, pt was getting 88ml/hr of TF. At 0000 (11/13) TF were increased to 35ml/hr (goal rate).

## 2022-06-04 NOTE — Progress Notes (Signed)
Asked to perform TEE today - attempted TEE probe intubation but could not intubate probe due to not enough space in her oropharynx to pass in presence of ET tube and ET tube sleeve - recommend attempting TEE once patient is extubated and respiratory status more stable.

## 2022-06-04 NOTE — Progress Notes (Addendum)
Pharmacy Antibiotic Note  Deanna Mcmillan is a 51 y.o. female admitted on 06/13/2022 with bacteremia/possible concern for meningitis/sepsis/SBP. Blood cultures from 11/7 showing MSSA, MSSE and listeria in 1/2 sets. Also with listeria meningitis. Pharmacy has been consulted for gentamicin dosing. SCr- 0.58 with CrCl ~ 80 ml/min.     Plan:  Ampicillin 2 gm every 4 hours  Gentamicin 80 mg Q 12 hours (Predicted peak 3.75 (Goal 3-4) and trough 0.8 (Goal < 1) )  Will repeat gent levels today/tomorrow  Cefazolin 2 gm IV Q 8 hours  Monitor renal function /cultures/clinical status    Weight: 75.2 kg (165 lb 12.6 oz)  Temp (24hrs), Avg:99.4 F (37.4 C), Min:97.7 F (36.5 C), Max:100.4 F (38 C)  Recent Labs  Lab 05/31/2022 1950 06/09/2022 1950 06/09/2022 2138 05/30/22 0040 05/31/22 0500 05/31/22 1510 06/01/22 0138 06/01/22 0520 06/01/22 1453 06/02/22 0453 06/03/22 0421 06/04/22 0329  WBC 12.5*  --   --  11.4* 14.0*  --   --  13.1*  --  12.4* 8.4 9.6  CREATININE  --    < > 0.91 0.79 0.62  --   --  0.56 0.64 0.59 0.55 0.58  LATICACIDVEN 2.9*  --  5.0* 5.1*  --  2.2*  --   --   --   --   --   --   GENTTROUGH  --   --   --   --   --   --   --  4.3*  --   --   --   --   GENTPEAK  --   --   --   --   --   --  6.9  --   --   --   --   --    < > = values in this interval not displayed.     Estimated Creatinine Clearance: 81.7 mL/min (by C-G formula based on SCr of 0.58 mg/dL).    No Known Allergies  Antimicrobials this admission: vancomycin 11/7 >> 11/11 Cefepime 11/7 >> 11/8 Gent 11/8> Ampicillin 11/8> Ceftriaxone 11/8 >11/11 Cefazolin 11/11>  Dose adjustments this admission:  11/10: Gent peak : 6.9, Gent trough: 4.3 >>Supratherapeutic>>Adjust dose to 80 mg q 12 hours    Thank you for allowing pharmacy to be a part of this patient's care.  Jimmy Footman, PharmD, BCPS, BCIDP Infectious Diseases Clinical Pharmacist Phone: [phone] 06/04/2022 1:56 PM

## 2022-06-05 ENCOUNTER — Inpatient Hospital Stay (HOSPITAL_COMMUNITY): Payer: 59

## 2022-06-05 DIAGNOSIS — J9601 Acute respiratory failure with hypoxia: Secondary | ICD-10-CM | POA: Diagnosis not present

## 2022-06-05 LAB — CBC
HCT: 27.5 % — ABNORMAL LOW (ref 36.0–46.0)
Hemoglobin: 9.1 g/dL — ABNORMAL LOW (ref 12.0–15.0)
MCH: 37 pg — ABNORMAL HIGH (ref 26.0–34.0)
MCHC: 33.1 g/dL (ref 30.0–36.0)
MCV: 111.8 fL — ABNORMAL HIGH (ref 80.0–100.0)
Platelets: 145 10*3/uL — ABNORMAL LOW (ref 150–400)
RBC: 2.46 MIL/uL — ABNORMAL LOW (ref 3.87–5.11)
RDW: 17.2 % — ABNORMAL HIGH (ref 11.5–15.5)
WBC: 12 10*3/uL — ABNORMAL HIGH (ref 4.0–10.5)
nRBC: 0.8 % — ABNORMAL HIGH (ref 0.0–0.2)

## 2022-06-05 LAB — COMPREHENSIVE METABOLIC PANEL
ALT: 21 U/L (ref 0–44)
AST: 63 U/L — ABNORMAL HIGH (ref 15–41)
Albumin: 3.2 g/dL — ABNORMAL LOW (ref 3.5–5.0)
Alkaline Phosphatase: 103 U/L (ref 38–126)
Anion gap: 12 (ref 5–15)
BUN: 31 mg/dL — ABNORMAL HIGH (ref 6–20)
CO2: 35 mmol/L — ABNORMAL HIGH (ref 22–32)
Calcium: 10.9 mg/dL — ABNORMAL HIGH (ref 8.9–10.3)
Chloride: 110 mmol/L (ref 98–111)
Creatinine, Ser: 0.71 mg/dL (ref 0.44–1.00)
GFR, Estimated: >60 mL/min (ref >60)
Glucose, Bld: 148 mg/dL — ABNORMAL HIGH (ref 70–99)
Potassium: 2.8 mmol/L — ABNORMAL LOW (ref 3.5–5.1)
Sodium: 157 mmol/L — ABNORMAL HIGH (ref 135–145)
Total Bilirubin: 3.5 mg/dL — ABNORMAL HIGH (ref 0.3–1.2)
Total Protein: 6.7 g/dL (ref 6.5–8.1)

## 2022-06-05 LAB — BASIC METABOLIC PANEL
Anion gap: 10 (ref 5–15)
BUN: 33 mg/dL — ABNORMAL HIGH (ref 6–20)
CO2: 36 mmol/L — ABNORMAL HIGH (ref 22–32)
Calcium: 10.7 mg/dL — ABNORMAL HIGH (ref 8.9–10.3)
Chloride: 111 mmol/L (ref 98–111)
Creatinine, Ser: 0.64 mg/dL (ref 0.44–1.00)
GFR, Estimated: >60 mL/min (ref >60)
Glucose, Bld: 135 mg/dL — ABNORMAL HIGH (ref 70–99)
Potassium: 3.7 mmol/L (ref 3.5–5.1)
Sodium: 157 mmol/L — ABNORMAL HIGH (ref 135–145)

## 2022-06-05 LAB — GLUCOSE, CAPILLARY
Glucose-Capillary: 139 mg/dL — ABNORMAL HIGH (ref 70–99)
Glucose-Capillary: 165 mg/dL — ABNORMAL HIGH (ref 70–99)
Glucose-Capillary: 169 mg/dL — ABNORMAL HIGH (ref 70–99)
Glucose-Capillary: 159 mg/dL — ABNORMAL HIGH (ref 70–99)
Glucose-Capillary: 134 mg/dL — ABNORMAL HIGH (ref 70–99)
Glucose-Capillary: 143 mg/dL — ABNORMAL HIGH (ref 70–99)

## 2022-06-05 LAB — GENTAMICIN LEVEL, TROUGH: Gentamicin Trough: 1.6 ug/mL (ref 0.5–2.0)

## 2022-06-05 LAB — TRIGLYCERIDES: Triglycerides: 99 mg/dL (ref <150)

## 2022-06-05 LAB — MAGNESIUM: Magnesium: 2.2 mg/dL (ref 1.7–2.4)

## 2022-06-05 LAB — PHOSPHORUS: Phosphorus: 4.2 mg/dL (ref 2.5–4.6)

## 2022-06-05 LAB — PATHOLOGIST SMEAR REVIEW

## 2022-06-05 MED ORDER — ONDANSETRON HCL 4 MG/2ML IJ SOLN
4.0000 mg | Freq: Four times a day (QID) | INTRAMUSCULAR | Status: AC | PRN
  Administered 2022-06-05: 4 mg via INTRAVENOUS
  Filled 2022-06-05: qty 2

## 2022-06-05 MED ORDER — POTASSIUM CHLORIDE 20 MEQ PO PACK
40.0000 meq | PACK | Freq: Once | ORAL | Status: AC
  Administered 2022-06-05: 40 meq
  Filled 2022-06-05: qty 2

## 2022-06-05 MED ORDER — INSULIN ASPART 100 UNIT/ML IJ SOLN
3.0000 [IU] | INTRAMUSCULAR | Status: DC
  Administered 2022-06-05: 2 [IU] via SUBCUTANEOUS

## 2022-06-05 MED ORDER — POTASSIUM CHLORIDE 20 MEQ PO PACK
60.0000 meq | PACK | Freq: Two times a day (BID) | ORAL | Status: DC
  Administered 2022-06-05 – 2022-06-06 (×3): 60 meq
  Filled 2022-06-05 (×3): qty 3

## 2022-06-05 MED ORDER — INSULIN ASPART 100 UNIT/ML IJ SOLN
2.0000 [IU] | INTRAMUSCULAR | Status: DC
  Administered 2022-06-05 (×2): 2 [IU] via SUBCUTANEOUS

## 2022-06-05 MED ORDER — FUROSEMIDE 10 MG/ML IJ SOLN: 80.0000 mg | Freq: Two times a day (BID) | INTRAMUSCULAR | Status: DC

## 2022-06-05 MED ORDER — GENTAMICIN SULFATE 40 MG/ML IJ SOLN
40.0000 mg | Freq: Two times a day (BID) | INTRAVENOUS | Status: DC
  Administered 2022-06-05 – 2022-06-08 (×6): 40 mg via INTRAVENOUS
  Filled 2022-06-05 (×8): qty 1

## 2022-06-05 MED ORDER — POTASSIUM CHLORIDE 10 MEQ/50ML IV SOLN
10.0000 meq | INTRAVENOUS | Status: AC
  Administered 2022-06-05 (×4): 10 meq via INTRAVENOUS
  Filled 2022-06-05 (×3): qty 50

## 2022-06-05 MED ORDER — INSULIN ASPART 100 UNIT/ML IJ SOLN
2.0000 [IU] | INTRAMUSCULAR | Status: DC
  Administered 2022-06-05 – 2022-06-14 (×52): 2 [IU] via SUBCUTANEOUS

## 2022-06-05 MED ORDER — INSULIN ASPART 100 UNIT/ML IJ SOLN
0.0000 [IU] | INTRAMUSCULAR | Status: DC
  Administered 2022-06-05: 2 [IU] via SUBCUTANEOUS
  Administered 2022-06-05 (×3): 3 [IU] via SUBCUTANEOUS
  Administered 2022-06-05: 2 [IU] via SUBCUTANEOUS
  Administered 2022-06-05: 3 [IU] via SUBCUTANEOUS
  Administered 2022-06-05: 2 [IU] via SUBCUTANEOUS
  Administered 2022-06-06: 3 [IU] via SUBCUTANEOUS
  Administered 2022-06-06 – 2022-06-07 (×5): 2 [IU] via SUBCUTANEOUS
  Administered 2022-06-07: 3 [IU] via SUBCUTANEOUS
  Administered 2022-06-07: 2 [IU] via SUBCUTANEOUS
  Administered 2022-06-07: 3 [IU] via SUBCUTANEOUS
  Administered 2022-06-08 (×4): 2 [IU] via SUBCUTANEOUS
  Administered 2022-06-08: 3 [IU] via SUBCUTANEOUS
  Administered 2022-06-09 (×3): 2 [IU] via SUBCUTANEOUS
  Administered 2022-06-09 – 2022-06-10 (×3): 3 [IU] via SUBCUTANEOUS
  Administered 2022-06-10 – 2022-06-11 (×8): 2 [IU] via SUBCUTANEOUS
  Administered 2022-06-11 (×2): 3 [IU] via SUBCUTANEOUS
  Administered 2022-06-11 – 2022-06-12 (×3): 2 [IU] via SUBCUTANEOUS
  Administered 2022-06-12 (×3): 3 [IU] via SUBCUTANEOUS
  Administered 2022-06-12: 2 [IU] via SUBCUTANEOUS
  Administered 2022-06-13 (×2): 3 [IU] via SUBCUTANEOUS
  Administered 2022-06-13: 2 [IU] via SUBCUTANEOUS
  Administered 2022-06-13 (×4): 3 [IU] via SUBCUTANEOUS
  Administered 2022-06-14 – 2022-06-15 (×7): 2 [IU] via SUBCUTANEOUS
  Administered 2022-06-15: 5 [IU] via SUBCUTANEOUS
  Administered 2022-06-15 (×2): 2 [IU] via SUBCUTANEOUS
  Administered 2022-06-15: 5 [IU] via SUBCUTANEOUS
  Administered 2022-06-15 – 2022-06-16 (×3): 3 [IU] via SUBCUTANEOUS
  Administered 2022-06-16 (×3): 2 [IU] via SUBCUTANEOUS
  Administered 2022-06-16: 3 [IU] via SUBCUTANEOUS
  Administered 2022-06-17: 2 [IU] via SUBCUTANEOUS
  Administered 2022-06-17 (×3): 3 [IU] via SUBCUTANEOUS
  Administered 2022-06-17 – 2022-06-18 (×4): 2 [IU] via SUBCUTANEOUS
  Administered 2022-06-18 (×2): 3 [IU] via SUBCUTANEOUS
  Administered 2022-06-18 (×2): 2 [IU] via SUBCUTANEOUS
  Administered 2022-06-19: 3 [IU] via SUBCUTANEOUS
  Administered 2022-06-19 – 2022-06-20 (×9): 2 [IU] via SUBCUTANEOUS
  Administered 2022-06-21: 3 [IU] via SUBCUTANEOUS
  Administered 2022-06-21: 2 [IU] via SUBCUTANEOUS

## 2022-06-05 MED ORDER — FREE WATER
300.0000 mL | Status: DC
  Administered 2022-06-05 – 2022-06-06 (×8): 300 mL

## 2022-06-05 NOTE — Progress Notes (Signed)
Patient had an episode of vomiting after turning and repositioning. Patient suctioned and tube feeds stopped. Patients Qtc 0.53. Clarence notified.

## 2022-06-05 NOTE — Progress Notes (Signed)
eLink Physician-Brief Progress Note Patient Name: SHANASIA IBRAHIM DOB: Aug 16, 1970 MRN: [id number]   Date of Service  06/05/2022  HPI/Events of Note  Patient is vomiting , tube feeds held.  eICU Interventions  Zofran 4 mg iv Q 4 hours PRN x 2 doses ordered.        [name] [place] 06/05/2022, 11:07 PM

## 2022-06-05 NOTE — Progress Notes (Signed)
[facility] ADULT ICU REPLACEMENT PROTOCOL   The patient does apply for the [facility] Adult ICU Electrolyte Replacment Protocol based on the criteria listed below:   1.Exclusion criteria: TCTS, ECMO, Dialysis, and Myasthenia Gravis patients 2. Is GFR >/= 30 ml/min? Yes.    Patient's GFR today is >60 3. Is SCr </= 2? Yes.   Patient's SCr is 0.71 mg/dL 4. Did SCr increase >/= 0.5 in 24 hours? No. 5.Pt's weight >40kg  Yes.   6. Abnormal electrolyte(s): K+ 2.8  7. Electrolytes replaced per protocol 8.  Call MD STAT for K+ </= 2.5, Phos </= 1, or Mag </= 1 Physician:  n/a  Deanna Mcmillan 06/05/2022 4:41 AM

## 2022-06-05 NOTE — Progress Notes (Signed)
NAME:  Deanna Mcmillan, MRN:  [id number], DOB:  19-Feb-1971, LOS: 7 ADMISSION DATE:  06/18/2022, CONSULTATION DATE:  11/7 REFERRING MD:  Lendon Colonel, EDP CHIEF COMPLAINT:  AMS   History of Present Illness:  51 yo woman with hx of recent diagnosis of alcoholic hepatitis (dx 10/17), here with ams, fever.   History from [name].  Patient was in her normal state of health, until suddenly last night she developed a headache.  She went to sleep to rest.  Today she slept throughout the day, this evening her mother went to wake her and was unable to wake her up, so she called 911.     She has been doing well since her recent hospital discharge, taking all the meds as prescribed, no new meds.  Planned follow up with GI on 11/14.  LE edema had been improving.  No cough, sob no focal symptoms.  No known sick contacts.     In ED, febrile, tachycardic, lethargic, desaturating to 70s.   Intubated by ED physician.    Had been admitted earlier in October and started on course of prenisone for alc hepatitis.    Recently seen at [facility] 10/19: LE Edema. Lasix not helpful at home.  Admitted for diuresis.  Jaundice and abd distension also noted.  Pleural effusion.  GI saw: non enough ascites for tap  Started lasix and spironolactone.  28 day course of prednisolone, miralax, lactulose, miralax. Midodrine TID    In ED got cefepime, LR 1L, flagyl ordered, intubated, started on Propofol, vanc ordered   Pertinent  Medical History  ETOH Cirrhosis BLE edema recently worsening  S/p lap appy 2014    Echo: recent normal 51/0258   Home meds: folic ascid, acetaminohpen, lasix 40bid, lactulose 30 TID protonix, prednisone (04/29/22), prednisolone 04/28/22 three week course, thiamine, simethicone,    Per her mother she had been drinking at least one bottle of wine daily, she isnt clear exactly how much she was drinking but she did also drink [place].  She notes she had wanted to stop recently but been unable to.   She thinks she was depressed after experiencing perimenopause.  No tobacco.   Significant Hospital Events: Including procedures, antibiotic start and stop dates in addition to other pertinent events   11/7: Intubated in ED and admitted to ICU 11/7: [place] neg, CT A/P cirrhosis with portal hypertension, splenomegaly, moderate volume ascites, moderate bilateral pleural effusion 11/9 KUB: No ileus 11/10: LP confirms listeria meningitis 11/11 KUB: ileus 11/12 ileus resolved with aggressive bowel regimen  Interim History / Subjective:  Remains on full vent support overnight Had multiple episodes of watery stool overnight Off of sedation since 730 this morning Opens eyes and tract but not following commands  Objective   Blood pressure 119/60, pulse 89, temperature (!) 97.5 F (36.4 C), resp. rate 20, weight 75.2 kg, SpO2 97 %.    Vent Mode: PRVC FiO2 (%):  [40 %] 40 % Set Rate:  [20 bmp] 20 bmp Vt Set:  [420 mL] 420 mL PEEP:  [5 cmH20] 5 cmH20 Pressure Support:  [10 NID78-24 cmH20] 12 cmH20 Plateau Pressure:  [13 cmH20-16 cmH20] 13 cmH20   Intake/Output Summary (Last 24 hours) at 06/05/2022 0726 Last data filed at 06/05/2022 0700 Gross per 24 hour  Intake 2893.99 ml  Output 5000 ml  Net -2106.01 ml   Filed Weights   06/02/22 0405 06/03/22 0500 06/04/22 0500  Weight: 87.1 kg 69.8 kg 75.2 kg    Examination:  General: Acutely ill middle-aged woman laying in bed. NAD. HENT: [place]/AT. ET tube in place. Scleral icterus. Slight conjunctival edema with conjunctiva injection worse on the right eye Lungs: Mechanical lung sounds. Decreased breath sounds anteriorly.  Cardiovascular: RRR. 2+ pitting edema in both feet, improving. Abdomen: Soft. Mildly distended. Normal bowel sounds. Extremities: Well perfused. Warm and dry. Skin: Multiple bruises on the upper extremities, improving Neuro: Awake and tracking. Not following commands.  GU: Foley in place  Sodium trended to 157, K+ low at 2.8,  creatinine 0.7, mag 2.2, Phos 4.2 WBC 12.0, Hgb 9.1, platelet 145  Resolved Hospital Problem list    Assessment & Plan:   Severe sepsis Acute metabolic encephalopathy Listeria, MSSA bacteremia Listeria meningitis Continues to be on broad-spectrum antibiotics.  Repeat blood culture on 11/8 no growth after 5 days.  Mild leukocytosis today but fever curve trending down.  Blood pressure remained stable.  More awake off sedation this morning.  Still not following commands. -ID following, appreciate recs -Continue Ampicillin, gentamicin and Ancef -CT head if mental status does not improve off sedation -Trend CBC/fever curve  -Pending TEE after extubation   Acute hypoxic respiratory failure CAP versus aspiration pneumonia Compressive atelectasis Remains on full vent support overnight.  Tolerating pressure support of 12/5 this morning.  Diuresing appropriately with IV Lasix. -Continue full vent support -SBT today -Continue aggressive diuresing -Continue antibiotics as above  Fluid overloaded TTE during this admission unremarkable.  UOP of 3.1 L in the last 24 hours.  Down 4 lbs since correct admission weight. -Repeat IV Lasix 80 mg twice daily today -Monitor BMP/UOP -Strict INO's/daily weights  Hypernatremia Sodium increased from 148->157 overnight.  Likely multifactorial in the setting of insensible losses, diuretic and GI losses. Free water deficit of 4.6 L. -Free water flushes, 300 cc q3h -Trend BMP  Alcoholic cirrhosis Hepatic encephalopathy? Recently diagnosed cirrhosis during last hospitalization.  Concern for possible hepatic encephalopathy and possible SBP on admission. MELD Na score of 17. Child Pugh Class C. FIB-4 score of 7.46 indicates advance fibrosis . Peritoneal labs not consistent with SBP, however, patient received abx prior to tap. Patient with multiple bowel movements and watery stools overnight. -Continue IV Lasix as above -Continue home spironolactone -Cont  thiamine, folate., MVI -Continue lactulose, goal 2-3 BM  Ileus Resolved. Patient now with diarrhea worse overnight with stool output of 600 cc via Flexi-Seal in the last 24 hours. -Hold MiraLAX, Colace -Continue lactulose 30 mg TID  SVT Sinus tach Prolonged QTc Patient found to have SVT on 11/8 requiring amnio bolus and infusion.  Heart rate stable in the 80s to 90s. -Trend and replete electrolytes -Telemetry, QTc monitoring  Hypokalemia Hypophosphatemia, resolved. Potassium now up to 2.8 in the setting of IV diuresing and diarrhea.  Mag and Foss within normal limits. -Replete K+ via Iv and per tube -F/u 2 PM BMP -Daily BMP -Trend electrolytes  Best Practice (right click and "Reselect all SmartList Selections" daily)   Diet/type: tubefeeds and NPO DVT prophylaxis: prophylactic heparin  GI prophylaxis: PPI Lines: N/A Foley:  Yes, and it is still needed Code Status:  full code Last date of multidisciplinary goals of care discussion [updated mother at bedside]  Labs   CBC: Recent Labs  Lab 05/28/2022 1950 06/16/2022 2028 05/31/22 0500 06/01/22 0520 06/02/22 0453 06/03/22 0421 06/04/22 0329 06/05/22 0345  WBC 12.5*   < > 14.0* 13.1* 12.4* 8.4 9.6 12.0*  NEUTROABS 11.2*  --  12.3* 11.0* 9.1*  --   --   --  HGB 11.8*   < > 9.8* 9.4* 9.3* 8.2* 8.8* 9.1*  HCT 34.3*   < > 28.1* 28.4* 28.3* 24.7* 26.8* 27.5*  MCV 110.6*   < > 109.8* 113.1* 111.4* 110.3* 112.1* 111.8*  PLT 111*   < > 87* 71* 88* 88* 125* 145*   < > = values in this interval not displayed.    Basic Metabolic Panel: Recent Labs  Lab 05/31/22 0500 06/01/22 0520 06/01/22 1252 06/01/22 1453 06/01/22 1838 06/01/22 2330 06/02/22 0453 06/03/22 0421 06/04/22 0329 06/05/22 0345  NA 141 144  --  144  --   --  146* 150* 148* 157*  K 3.2* 2.7*   < > 3.5   < > 4.0 3.9 2.8* 4.1 2.8*  CL 111 113*  --  111  --   --  116* 108 103 110  CO2 22 23  --  23  --   --  24 30 31  35*  GLUCOSE 173* 171*  --  179*  --    --  151* 160* 214* 148*  BUN 15 16  --  16  --   --  16 17 19  31*  CREATININE 0.62 0.56  --  0.64  --   --  0.59 0.55 0.58 0.71  CALCIUM 8.7* 8.3*  --  8.3*  --   --  8.7* 9.6 10.0 10.9*  MG 2.4 2.3  --   --   --   --  2.2 2.2 2.1 2.2  PHOS 1.7* 2.6  --   --   --   --   --  2.3* 7.3* 4.2   < > = values in this interval not displayed.   GFR: Estimated Creatinine Clearance: 81.7 mL/min (by C-G formula based on SCr of 0.71 mg/dL). Recent Labs  Lab 06/12/2022 1950 05/25/2022 2138 05/30/22 0040 05/31/22 0500 05/31/22 1510 06/01/22 0520 06/02/22 0453 06/03/22 0421 06/04/22 0329 06/05/22 0345  PROCALCITON  --  1.31 2.25 2.93  --   --   --   --   --   --   WBC 12.5*  --  11.4* 14.0*  --    < > 12.4* 8.4 9.6 12.0*  LATICACIDVEN 2.9* 5.0* 5.1*  --  2.2*  --   --   --   --   --    < > = values in this interval not displayed.    Liver Function Tests: Recent Labs  Lab 06/01/22 0520 06/02/22 0453 06/03/22 0421 06/04/22 0329 06/05/22 0345  AST 47* 42* 39 52* 63*  ALT 32 31 26 23 21   ALKPHOS 76 83 91 103 103  BILITOT 3.8* 3.3* 3.6* 3.7* 3.5*  PROT 5.5* 5.9* 6.2* 6.5 6.7  ALBUMIN 2.6* 2.4* 3.3* 3.3* 3.2*   No results for input(s): "LIPASE", "AMYLASE" in the last 168 hours. Recent Labs  Lab 05/31/2022 1937 05/30/22 0040 06/04/22 0329  AMMONIA 91* 58* 24    ABG    Component Value Date/Time   PHART 7.379 05/30/2022 0453   PCO2ART 34.3 05/30/2022 0453   PO2ART 90 05/30/2022 0453   HCO3 19.9 (L) 05/30/2022 0453   TCO2 21 (L) 05/30/2022 0453   ACIDBASEDEF 4.0 (H) 05/30/2022 0453   O2SAT 96 05/30/2022 0453     Coagulation Profile: Recent Labs  Lab 06/17/2022 1950 05/30/22 0040  INR 1.4* 1.5*    Cardiac Enzymes: No results for input(s): "CKTOTAL", "CKMB", "CKMBINDEX", "TROPONINI" in the last 168 hours.  HbA1C: Hgb A1c MFr Bld  Date/Time  Value Ref Range Status  04/25/2022 03:41 AM <4.2 (L) 4.8 - 5.6 % Final    Comment:    (NOTE) **Verified by repeat analysis**          Prediabetes: 5.7 - 6.4         Diabetes: >6.4         Glycemic control for adults with diabetes: <7.0     CBG: Recent Labs  Lab 06/04/22 1115 06/04/22 1524 06/04/22 1937 06/04/22 2318 06/05/22 0318  GLUCAP 142* 152* 147* 171* 139*    Review of Systems:   As in HPI  Past Medical History:  She,  has no past medical history on file.   Surgical History:   Past Surgical History:  Procedure Laterality Date   KNEE ARTHROSCOPY W/ ACL RECONSTRUCTION Left 1994/5   Dr. Weston Anna   LAPAROSCOPIC APPENDECTOMY N/A 04/11/2013   Procedure: APPENDECTOMY LAPAROSCOPIC;  Surgeon: Stark Klein, MD;  Location: [place];  Service: General;  Laterality: N/A;   [place]   3 extractions     Social History:   reports that she has never smoked. She does not have any smokeless tobacco history on file. She reports current alcohol use. She reports that she does not use drugs.   Family History:  Her family history is not on file.   Allergies No Known Allergies   Home Medications  Prior to Admission medications   Medication Sig Start Date End Date Taking? Authorizing Provider  acetaminophen (TYLENOL) 325 MG tablet Take 650 mg by mouth every 6 (six) hours as needed for headache or moderate pain.   Yes [provider]  folic acid (FOLVITE) 1 MG tablet Take 1 tablet (1 mg total) by mouth daily. 04/28/22  Yes Hosie Poisson, MD  furosemide (LASIX) 40 MG tablet Take 1 tablet (40 mg total) by mouth daily. 05/12/22 05/12/23 Yes Sheikh, Omair Latif, DO  ibuprofen (ADVIL) 200 MG tablet Take 400 mg by mouth every 6 (six) hours as needed for headache or moderate pain.   Yes [provider]  KLOR-CON M20 20 MEQ tablet Take 20 mEq by mouth daily. 05/14/22  Yes [provider]  lactulose (CHRONULAC) 10 GM/15ML solution Take 30 mLs (20 g total) by mouth 3 (three) times daily. 04/27/22  Yes Hosie Poisson, MD  Multiple Vitamin (MULTIVITAMIN WITH MINERALS) TABS tablet  Take 1 tablet by mouth daily. 04/28/22  Yes Hosie Poisson, MD  pantoprazole (PROTONIX) 40 MG tablet Take 1 tablet (40 mg total) by mouth daily. 04/27/22 04/27/23 Yes Hosie Poisson, MD  polyethylene glycol (MIRALAX / GLYCOLAX) 17 g packet Take 17 g by mouth daily as needed for mild constipation. 05/12/22  Yes Sheikh, Omair Latif, DO  prednisoLONE 5 MG TABS tablet Take 40 mg by mouth daily. 05/14/22  Yes [provider]  predniSONE (DELTASONE) 20 MG tablet Take 40 mg by mouth daily. 04/29/22  Yes [provider]  simethicone (MYLICON) 80 MG chewable tablet Chew 1 tablet (80 mg total) by mouth every 6 (six) hours as needed for flatulence. 04/27/22  Yes Hosie Poisson, MD  spironolactone (ALDACTONE) 50 MG tablet Take 1 tablet (50 mg total) by mouth daily. 05/13/22  Yes Sheikh, Omair Latif, DO  thiamine (VITAMIN B-1) 100 MG tablet Take 1 tablet (100 mg total) by mouth daily. 04/28/22  Yes Hosie Poisson, MD     Critical care time: 66

## 2022-06-05 NOTE — Progress Notes (Signed)
eLink Physician-Brief Progress Note Patient Name: Deanna Mcmillan DOB: [birth year] MRN: [id number]   Date of Service  06/05/2022  HPI/Events of Note  Patient is hyperglycemic, she is receiving enteral nutrition gtt.  eICU Interventions  SSI coverage ordered.        [name] Narciso Stoutenburg 06/05/2022, 12:28 AM

## 2022-06-05 NOTE — Progress Notes (Addendum)
[facility] for Infectious Disease  Date of Admission:  06/08/2022   Total days of inpatient antibiotics 4  Principal Problem:   Acute respiratory failure with hypoxia (HCC)          Assessment: 51 YF admitted with:  #Listeria monocytogenes, MSSA bacteremia #Listeria meningitis #Fever #Acute encephalopathy #Hx of Etoh abuse with cirrhosis #Tracheal aspirates+ MSSA - Blood cultures from 11/7 showing 1/2 staph epi(contaminant), MSSA, 2/2 Listeria monocytogenes.  We will do double coverage with ampicillin gentamicin administered. 11/8 Blood Cx NG.  - Per mother at bedside patient had not been feeling well for last few weeks.  On Monday she had abdominal pain throughout the day followed by headache at night but states she was unarousable and presented to the ED.  She was intubated. - I think she would benefit from a LP given Listeria bacteremia and her history of headache along with fevers.  Unclear how long patient has been bacteremia, mother reports patient had not been feeling well since being hospitalized for alcoholic hepatitis 06/2 - 10/6.  She was discharged on steroids 40 mg daily for 28 days she was again hospitalized 10/20 - 10/21 for bilateral lower extremity edema suspected secondary to third spacing, discharged on midodrine. It is possible patient developed SBP then became bacteremic along with meningitis versus she had bacteremia due to multiple healthcare exposures and underlying cirrhosis developing SBP and bacteremia.  Cultures and studies are pending. -TTE poor windows, will get TEE  -CTX stopped as peritoneal fluid with no growth  - LP on 11/10 with ME panel+ listeria, CSF Cx NG, AFB cx pending. Cell count rbc 26.4K, wbc 80(37N, 53%L, 9% mono, 1% eos), >600 PTN, 86 GLC #Alcoholic cirrhosis with ascites SP paracentesis on 11/8 -50 cc of cloudy-yellowish fluid drained, Cx pending    Recommendations:  -Continue ampicillin and gentamicin for Listeria  bacteremia/meningitis -Cefazolin added for MSSA bacteremia/possible PNA -TEE after extubation per cards  #Urine Cx + Ecoli - treated Microbiology:   Antibiotics: Vancomycin 11/7-11/11 Cefazolin 1/11- Metronidazole 11/17- Cefepime 11/17-11/8  Gent 11/8- Ampicillin 11/8-   Cultures: Blood 11/7 1/2 MSSA, staph epi, listeria monocytogenes   11/8 Resp Cx pending, RVP negative Urine Cx + Ecoli  SUBJECTIVE: Intubated, mother at bedside Interval: Tmax of 100.8, wbc   Review of Systems: Review of Systems  Unable to perform ROS: Intubated  All other systems reviewed and are negative.    Scheduled Meds:  Chlorhexidine Gluconate Cloth  6 each Topical Daily   docusate  100 mg Per Tube BID   famotidine  20 mg Per Tube BID   fentaNYL (SUBLIMAZE) injection  50 mcg Intravenous Once   folic acid  1 mg Per Tube Daily   furosemide  80 mg Intravenous BID   heparin  5,000 Units Subcutaneous Q8H   insulin aspart  0-15 Units Subcutaneous Q4H   insulin aspart  2 Units Subcutaneous Q4H   lactulose  30 g Per Tube TID   mouth rinse  15 mL Mouth Rinse Q2H   polyethylene glycol  17 g Per Tube Daily   spironolactone  50 mg Per Tube Daily   thiamine  100 mg Per Tube Daily   Continuous Infusions:  sodium chloride 5 mL/hr at 06/04/22 2342   ampicillin (OMNIPEN) IV Stopped (06/05/22 0849)    ceFAZolin (ANCEF) IV Stopped (06/05/22 0542)   feeding supplement (VITAL AF 1.2 CAL) 50 mL/hr at 06/05/22 0900   fentaNYL infusion INTRAVENOUS Stopped (06/05/22  4827)   gentamicin     propofol (DIPRIVAN) infusion Stopped (06/02/22 1448)   PRN Meds:.sodium chloride, acetaminophen, docusate, fentaNYL, mouth rinse, polyethylene glycol No Known Allergies  OBJECTIVE: Vitals:   06/05/22 0700 06/05/22 0800 06/05/22 0900 06/05/22 0933  BP: 119/60 124/61 119/60 126/62  Pulse: 89 91 90 92  Resp: 20 (!) 21 20 13   Temp: (!) 97.5 F (36.4 C) 98.2 F (36.8 C) 97.9 F (36.6 C) 97.9 F (36.6 C)  TempSrc:   Oral    SpO2: 97% 96% 97% 96%  Weight:       Body mass index is 28.46 kg/m.  Physical Exam Constitutional:      Comments: Intubated and sedated.   HENT:     Head: Normocephalic and atraumatic.     Right Ear: Tympanic membrane normal.     Left Ear: Tympanic membrane normal.     Nose: Nose normal.     Mouth/Throat:     Mouth: Mucous membranes are moist.  Eyes:     Extraocular Movements: Extraocular movements intact.     Conjunctiva/sclera: Conjunctivae normal.     Pupils: Pupils are equal, round, and reactive to light.  Cardiovascular:     Rate and Rhythm: Normal rate and regular rhythm.     Heart sounds: No murmur heard.    No friction rub. No gallop.  Pulmonary:     Effort: Pulmonary effort is normal.     Breath sounds: Normal breath sounds.  Abdominal:     General: Abdomen is flat.     Palpations: Abdomen is soft.  Musculoskeletal:        General: Normal range of motion.  Skin:    General: Skin is warm and dry.  Psychiatric:        Mood and Affect: Mood normal.       Lab Results Lab Results  Component Value Date   WBC 12.0 (H) 06/05/2022   HGB 9.1 (L) 06/05/2022   HCT 27.5 (L) 06/05/2022   MCV 111.8 (H) 06/05/2022   PLT 145 (L) 06/05/2022    Lab Results  Component Value Date   CREATININE 0.71 06/05/2022   BUN 31 (H) 06/05/2022   NA 157 (H) 06/05/2022   K 2.8 (L) 06/05/2022   CL 110 06/05/2022   CO2 35 (H) 06/05/2022    Lab Results  Component Value Date   ALT 21 06/05/2022   AST 63 (H) 06/05/2022   ALKPHOS 103 06/05/2022   BILITOT 3.5 (H) 06/05/2022        Laurice Record, [facility] 06/05/2022, 10:48 AM

## 2022-06-05 NOTE — Progress Notes (Signed)
Pharmacy Antibiotic Note  Deanna Mcmillan is a 51 y.o. female admitted on 05/28/2022 with bacteremia/possible concern for meningitis/sepsis/SBP. Blood cultures from 11/7 showing MSSA, MSSE and listeria in 1/2 sets. Also with listeria meningitis. Pharmacy has been consulted for gentamicin dosing. SCr- 0.71 with CrCl ~ 82 ml/min.   Patient has been receiving gentamicin at 80 mg Q12 hours after previous supratherapeutic levels. Gent peak 11/13 2011 @ 5.2. Gent trough 11/14 0611 1.6. Gentamicin levels remain supratherapeutic requiring dose adjustment. WBC elevated at 12.0 and tmax 101.1 in past 24 hours. Continue on same antibiotic regimen for now.   Plan:  Ampicillin 2 gm every 4 hours  Decrease to gentamicin 40 mg Q 12 hours (Predicted peak 4.2 (Goal 3-4) and trough 1.1 (Goal < 1) )  Cefazolin 2 gm IV Q 8 hours  Monitor renal function /cultures/clinical status  Obtain gentamicin peaks and troughs as clinically appropriate  Narrow abx as able and f/u with appropriate duration      Weight: 75.2 kg (165 lb 12.6 oz)  Temp (24hrs), Avg:99.8 F (37.7 C), Min:97.5 F (36.4 C), Max:101.1 F (38.4 C)  Recent Labs  Lab 06/20/2022 1950 05/27/2022 1950 06/03/2022 2138 05/30/22 0040 05/31/22 0500 05/31/22 1510 06/01/22 0138 06/01/22 0520 06/01/22 1453 06/02/22 0453 06/03/22 0421 06/04/22 0329 06/04/22 2011 06/05/22 0345 06/05/22 0611  WBC 12.5*  --   --  11.4*   < >  --   --  13.1*  --  12.4* 8.4 9.6  --  12.0*  --   CREATININE  --    < > 0.91 0.79   < >  --   --  0.56 0.64 0.59 0.55 0.58  --  0.71  --   LATICACIDVEN 2.9*  --  5.0* 5.1*  --  2.2*  --   --   --   --   --   --   --   --   --   GENTTROUGH  --   --   --   --   --   --   --  4.3*  --   --   --   --   --   --  1.6  GENTPEAK  --   --   --   --   --   --  6.9  --   --   --   --   --  5.2  --   --    < > = values in this interval not displayed.    Estimated Creatinine Clearance: 81.7 mL/min (by C-G formula based on SCr of 0.71  mg/dL).    No Known Allergies  Antimicrobials this admission: vancomycin 11/7 >> 11/11 Cefepime 11/7 >> 11/8 Gent 11/8> Ampicillin 11/8> Ceftriaxone 11/8 >11/11 Cefazolin 11/11>  Dose adjustments this admission:  11/10: Gent peak : 6.9, Gent trough: 4.3 >> adjusted to 80 mg q12h 11/14: Gent peak: 5.2, Gent trough: 1.6 >> adjusted to 40 mg q12h   Thank you for allowing pharmacy to be a part of this patient's care.   Gena Fray, PharmD PGY1 Pharmacy Resident   06/05/2022 7:57 AM

## 2022-06-06 ENCOUNTER — Inpatient Hospital Stay (HOSPITAL_COMMUNITY): Payer: 59

## 2022-06-06 DIAGNOSIS — J9601 Acute respiratory failure with hypoxia: Secondary | ICD-10-CM | POA: Diagnosis not present

## 2022-06-06 DIAGNOSIS — L8990 Pressure ulcer of unspecified site, unspecified stage: Secondary | ICD-10-CM | POA: Insufficient documentation

## 2022-06-06 LAB — COMPREHENSIVE METABOLIC PANEL
ALT: 17 U/L (ref 0–44)
AST: 52 U/L — ABNORMAL HIGH (ref 15–41)
Albumin: 3 g/dL — ABNORMAL LOW (ref 3.5–5.0)
Alkaline Phosphatase: 102 U/L (ref 38–126)
Anion gap: 12 (ref 5–15)
BUN: 36 mg/dL — ABNORMAL HIGH (ref 6–20)
CO2: 34 mmol/L — ABNORMAL HIGH (ref 22–32)
Calcium: 10.9 mg/dL — ABNORMAL HIGH (ref 8.9–10.3)
Chloride: 109 mmol/L (ref 98–111)
Creatinine, Ser: 0.64 mg/dL (ref 0.44–1.00)
GFR, Estimated: >60 mL/min (ref >60)
Glucose, Bld: 165 mg/dL — ABNORMAL HIGH (ref 70–99)
Potassium: 3.6 mmol/L (ref 3.5–5.1)
Sodium: 155 mmol/L — ABNORMAL HIGH (ref 135–145)
Total Bilirubin: 3.2 mg/dL — ABNORMAL HIGH (ref 0.3–1.2)
Total Protein: 6.5 g/dL (ref 6.5–8.1)

## 2022-06-06 LAB — CBC
HCT: 26.9 % — ABNORMAL LOW (ref 36.0–46.0)
Hemoglobin: 8.5 g/dL — ABNORMAL LOW (ref 12.0–15.0)
MCH: 36.6 pg — ABNORMAL HIGH (ref 26.0–34.0)
MCHC: 31.6 g/dL (ref 30.0–36.0)
MCV: 115.9 fL — ABNORMAL HIGH (ref 80.0–100.0)
Platelets: 134 10*3/uL — ABNORMAL LOW (ref 150–400)
RBC: 2.32 MIL/uL — ABNORMAL LOW (ref 3.87–5.11)
RDW: 17.2 % — ABNORMAL HIGH (ref 11.5–15.5)
WBC: 10.4 10*3/uL (ref 4.0–10.5)
nRBC: 0.8 % — ABNORMAL HIGH (ref 0.0–0.2)

## 2022-06-06 LAB — PHOSPHORUS: Phosphorus: 3.4 mg/dL (ref 2.5–4.6)

## 2022-06-06 LAB — GLUCOSE, CAPILLARY
Glucose-Capillary: 139 mg/dL — ABNORMAL HIGH (ref 70–99)
Glucose-Capillary: 119 mg/dL — ABNORMAL HIGH (ref 70–99)
Glucose-Capillary: 121 mg/dL — ABNORMAL HIGH (ref 70–99)
Glucose-Capillary: 132 mg/dL — ABNORMAL HIGH (ref 70–99)
Glucose-Capillary: 124 mg/dL — ABNORMAL HIGH (ref 70–99)
Glucose-Capillary: 152 mg/dL — ABNORMAL HIGH (ref 70–99)

## 2022-06-06 LAB — MAGNESIUM: Magnesium: 2.2 mg/dL (ref 1.7–2.4)

## 2022-06-06 MED ORDER — LACTULOSE 10 GM/15ML PO SOLN
20.0000 g | Freq: Three times a day (TID) | ORAL | Status: DC
  Administered 2022-06-06 – 2022-06-07 (×3): 20 g
  Filled 2022-06-06 (×3): qty 30

## 2022-06-06 MED ORDER — FENTANYL CITRATE PF 50 MCG/ML IJ SOSY
25.0000 ug | PREFILLED_SYRINGE | INTRAMUSCULAR | Status: DC | PRN
  Administered 2022-06-06: 50 ug via INTRAVENOUS
  Administered 2022-06-07: 25 ug via INTRAVENOUS
  Administered 2022-06-07: 50 ug via INTRAVENOUS
  Administered 2022-06-07: 25 ug via INTRAVENOUS
  Filled 2022-06-06 (×5): qty 1

## 2022-06-06 MED ORDER — POTASSIUM CHLORIDE 20 MEQ PO PACK
40.0000 meq | PACK | Freq: Once | ORAL | Status: AC
  Administered 2022-06-06: 40 meq
  Filled 2022-06-06: qty 2

## 2022-06-06 MED ORDER — VITAL AF 1.2 CAL PO LIQD
1000.0000 mL | ORAL | Status: DC
  Administered 2022-06-06 – 2022-06-14 (×8): 1000 mL

## 2022-06-06 MED ORDER — POTASSIUM CHLORIDE 20 MEQ PO PACK
40.0000 meq | PACK | Freq: Two times a day (BID) | ORAL | Status: DC
  Administered 2022-06-06 – 2022-06-15 (×18): 40 meq
  Filled 2022-06-06 (×19): qty 2

## 2022-06-06 MED ORDER — FREE WATER
300.0000 mL | Status: DC
  Administered 2022-06-06 – 2022-06-08 (×22): 300 mL

## 2022-06-06 NOTE — Progress Notes (Addendum)
Moderate tremors noted/shaking intermittently. Fully awake/eyes opened. Remains intubated. She has not been on sedation since this a.m.  She is not following commands to squeeze hands or move fingers but is tracking me with her eyes and looks at me when I speak to her. Mother is at bedside and said she has been noticing the tremors this evening. Notified Elink nurse.

## 2022-06-06 NOTE — Progress Notes (Signed)
Nutrition Follow-up  DOCUMENTATION CODES:  Not applicable  INTERVENTION:  Continue tube feeding via NGT, switch to cortrak tube today. Adjust to the following for loss of kcal from propofol: Vital AF at 60 ml/h (1440 ml per day) Advance by 10 q4h to goal of 60 Free water flush 300 q3h per CCM (2400mL/d) Provides 1728 kcal, 108gm protein, 1168 ml free water daily TF+flush = 3568mL/d Thiamine and folic acid daily for hx of EtOH abuse  NUTRITION DIAGNOSIS:  Inadequate oral intake related to inability to eat as evidenced by NPO status. - remains applicable  GOAL:  Patient will meet greater than or equal to 90% of their needs - progressing, being met with TF  MONITOR:  Vent status, Labs, TF tolerance  REASON FOR ASSESSMENT:  Ventilator    ASSESSMENT:   Pt with hx of EtOH abuse with cirrhosis presented to ED after being found unresponsive.   11/7 - intubated and admitted to ICU   Pt remains intubated, family at bedside. Pt vomited overnight after being repositioned. TF held and now infusing at a trickle of 20mL/h. Discussed in rounds. Possible extubation today, CCM requests tube be exchanged for cortrak. Placed consult. Also ok to begin to re-advance feeds. Pt able to have propofol weaned, will adjust orders to account for loss of kcal from propofol.   MV: 7.1 L/min Temp (24hrs), Avg:97.9 F (36.6 C), Min:97.2 F (36.2 C), Max:98.6 F (37 C)   Intake/Output Summary (Last 24 hours) at 06/06/2022 1138 Last data filed at 06/06/2022 1100 Gross per 24 hour  Intake 3411.96 ml  Output 4405 ml  Net -993.04 ml  Net IO Since Admission: -960.67 mL [06/06/22 1138]   Nutritionally Relevant Medications: Scheduled Meds:  famotidine  20 mg Per Tube BID   folic acid  1 mg Per Tube Daily   free water  300 mL Per Tube Q3H   furosemide  80 mg Intravenous BID   insulin aspart  0-15 Units Subcutaneous Q4H   insulin aspart  2 Units Subcutaneous Q4H   lactulose  30 g Per Tube TID    potassium chloride  60 mEq Per Tube BID   spironolactone  50 mg Per Tube Daily   thiamine  100 mg Per Tube Daily   Continuous Infusions:  feeding supplement (VITAL AF 1.2 CAL) 25 mL/hr at 06/06/22 0800   PRN Meds: docusate, ondansetron, polyethylene glycol  Labs Reviewed: Na 155 Calcium corrects to 11.7 for low albumin (high)  CBG ranges from 119-169 mg/dL over the last 24 hours Lab Results  Component Value Date   ALT 17 06/06/2022   AST 52 (H) 06/06/2022   ALKPHOS 102 06/06/2022   BILITOT 3.2 (H) 06/06/2022    NUTRITION - FOCUSED PHYSICAL EXAM: Flowsheet Row Most Recent Value  Orbital Region No depletion  Upper Arm Region No depletion  Thoracic and Lumbar Region No depletion  Buccal Region No depletion  Temple Region No depletion  Clavicle Bone Region No depletion  Clavicle and Acromion Bone Region No depletion  Scapular Bone Region No depletion  Dorsal Hand No depletion  Patellar Region No depletion  Anterior Thigh Region No depletion  Posterior Calf Region No depletion  Edema (RD Assessment) Moderate  [pitting to BLE and BUE]  Hair Reviewed  Eyes Reviewed  Mouth Reviewed  Skin Reviewed  Nails Reviewed   Diet Order:   Diet Order             Diet NPO time specified  Diet effective now                     EDUCATION NEEDS:  Not appropriate for education at this time  Skin:  Skin Assessment: Reviewed RN Assessment  Last BM:  11/15 - type 7, rectal pouch  Height:  Ht Readings from Last 1 Encounters:  05/10/22 5' 4" (1.626 m)    Weight:  Wt Readings from Last 1 Encounters:  06/06/22 67 kg    Ideal Body Weight:  54.5 kg  BMI:  Body mass index is 25.35 kg/m.  Estimated Nutritional Needs:  Kcal:  1800-2000 kcal/d Protein:  90-110 g/d Fluid:  2L/d   Ranell Patrick, RD, LDN Clinical Dietitian RD pager # available in [place]  After hours/weekend pager # available in [facility]

## 2022-06-06 NOTE — Procedures (Signed)
Cortrak  Person Inserting Tube:  Maylon Peppers C, RD Tube Type:  Cortrak - 43 inches Tube Size:  10 Tube Location:  Left nare Secured by: Bridle Technique Used to Measure Tube Placement:  Marking at nare/corner of mouth Cortrak Secured At:  72 cm   Cortrak Tube Team Note:  Consult received to place a Cortrak feeding tube.   X-ray is required, abdominal x-ray has been ordered by the Cortrak team. Please confirm tube placement before using the Cortrak tube.   If the tube becomes dislodged please keep the tube and contact the Cortrak team at [email or web address] for replacement.  If after hours and replacement cannot be delayed, place a NG tube and confirm placement with an abdominal x-ray.    Lockie Pares., RD, LDN, CNSC See AMiON for contact information

## 2022-06-06 NOTE — Progress Notes (Signed)
NAME:  Deanna Mcmillan, MRN:  [id number], DOB:  March 09, 1971, LOS: 22 ADMISSION DATE:  06/16/2022, CONSULTATION DATE:  11/7 REFERRING MD:  Deanna Mcmillan, EDP CHIEF COMPLAINT:  AMS   History of Present Illness:  51 yo woman with hx of recent diagnosis of alcoholic hepatitis (dx 57/26), here with ams, fever.   History from her mother Deanna Mcmillan.  Patient was in her normal state of health, until suddenly last night she developed a headache.  She went to sleep to rest.  Today she slept throughout the day, this evening her mother went to wake her and was unable to wake her up, so she called 911.     She has been doing well since her recent hospital discharge, taking all the meds as prescribed, no new meds.  Planned follow up with GI on 11/14.  LE edema had been improving.  No cough, sob no focal symptoms.  No known sick contacts.     In ED, febrile, tachycardic, lethargic, desaturating to 70s.   Intubated by ED physician.    Had been admitted earlier in October and started on course of prenisone for alc hepatitis.    Recently seen at [facility] 10/19: LE Edema. Lasix not helpful at home.  Admitted for diuresis.  Jaundice and abd distension also noted.  Pleural effusion.  GI saw: non enough ascites for tap  Started lasix and spironolactone.  28 day course of prednisolone, miralax, lactulose, miralax. Midodrine TID    In ED got cefepime, LR 1L, flagyl ordered, intubated, started on Propofol, vanc ordered   Pertinent  Medical History  ETOH Cirrhosis BLE edema recently worsening  S/p lap appy 2014    Echo: recent normal 20/3559   Home meds: folic ascid, acetaminohpen, lasix 40bid, lactulose 30 TID protonix, prednisone (04/29/22), prednisolone 04/28/22 three week course, thiamine, simethicone,    Per her mother she had been drinking at least one bottle of wine daily, she isnt clear exactly how much she was drinking but she did also drink [place].  She notes she had wanted to stop recently but been unable to.   She thinks she was depressed after experiencing perimenopause.  No tobacco.   Significant Hospital Events: Including procedures, antibiotic start and stop dates in addition to other pertinent events   11/7: Intubated in ED and admitted to ICU 11/7: [place] neg, CT A/P cirrhosis with portal hypertension, splenomegaly, moderate volume ascites, moderate bilateral pleural effusion 11/9 KUB: No ileus 11/10: LP confirms listeria meningitis 11/11 KUB: ileus 11/12 ileus resolved with aggressive bowel regimen 11/15 KUB: No ileus.  Interim History / Subjective:  Had episode of vomiting overnight, tube feed held Started on trickle feeds this morning Off sedation since this morning, eyes open and tracking but still not following commands Weaning on 10/5  Objective   Blood pressure 116/62, pulse 89, temperature 98.4 F (36.9 C), resp. rate (!) 22, weight 67 kg, SpO2 95 %.    Vent Mode: PRVC FiO2 (%):  [40 %] 40 % Set Rate:  [20 bmp] 20 bmp Vt Set:  [420 mL] 420 mL PEEP:  [5 cmH20] 5 cmH20 Pressure Support:  [10 RCB63-84 cmH20] 10 cmH20 Plateau Pressure:  [14 cmH20] 14 cmH20   Intake/Output Summary (Last 24 hours) at 06/06/2022 0747 Last data filed at 06/06/2022 0700 Gross per 24 hour  Intake 3478.1 ml  Output 4105 ml  Net -626.9 ml   Filed Weights   06/03/22 0500 06/04/22 0500 06/06/22 0500  Weight: 69.8 kg 75.2 kg  67 kg    Examination: General: Acutely ill middle-aged woman laying in bed. NAD. HENT: [place]/AT. ET tube in place. Scleral icterus. Conjunctival injection and edema improved. Lungs: Mechanical lung sounds.  No increased WOB. Cardiovascular: Mild tachycardia.  Regular rhythm.  1-2+ BLE pitting edema. Abdomen: Soft. Mildly distended.  Hypoactive bowel sounds Extremities: Well perfused. Warm and dry. Skin: Multiple bruises on the upper extremities, improving Neuro: Off sedation. Awake with eyes open. Tracking but not following any commands. GU: Foley and Flexi-Seal in  place  Sodium 155, K+ 3.6, creatinine 0.64, mag 2.4, Phos 3.4 WBC 10.4, Hgb 8.5, platelet 134 3.2 L of UOP last 24 hours  Resolved Hospital Problem list   Ileus  Assessment & Plan:   Severe sepsis Acute metabolic encephalopathy Listeria, MSSA bacteremia Listeria meningitis Continues to be on broad-spectrum antibiotics. Repeat blood culture on 11/8 no growth after 5 days. Leukocytosis has resolved, afebrile overnight. She remains critically ill and still not following commands off sedation. -ID following, appreciate recs -Continue Ampicillin, gentamicin and Ancef -Will consider CT head if mental status does not improve off sedation -Trend CBC/fever curve  -Pending TEE after extubation   Acute hypoxic respiratory failure CAP versus aspiration pneumonia Compressive atelectasis Remains on full vent support overnight. Briefly failed SBT this morning but currently weaning at 10/5. Volume status improving with IV diuresing.  -Continue low tidal volume ventilation -Continue SBT -Continue aggressive diuresing -Continue antibiotics as above  Fluid overloaded TTE during this admission unremarkable. UOP of 3.2 L over the last 24 hours. Potassium stable at 3.6. Patient still edematous in the extremities currently net negative 1 L.  -Continue IV Lasix 80 mg twice daily -Monitor BMP/UOP -Strict I&O's/daily weights  Hypernatremia Likely multifactorial in the setting of insensible losses, diuretic and GI losses. Free water deficit of 4.6 L. Minimal improvement with sodium down to 155 from 157. -Increase Free water flushes to 300 cc q2h -Trend BMP  Alcoholic cirrhosis Hepatic encephalopathy? Recently diagnosed cirrhosis during last hospitalization.  Concern for possible hepatic encephalopathy and possible SBP on admission. MELD Na score of 17. Child Pugh Class C. FIB-4 score of 7.46 indicates advance fibrosis . Peritoneal labs not consistent with SBP, however, patient received abx prior to  tap.  Patient continues to have diarrhea with a 25 cc of stool output in Flexi-Seal in the last 24 hours. -Continue IV Lasix as above -Continue home spironolactone -Cont thiamine, folate., MVI -Decrease lactulose to 20 mg TID, goal 2-3 BM  Diarrhea History of recent ileus. Patient continues to have watery stools with 825 cc of stool output the last 24 hours. WBC normal.  -Continue to hold bowel regiment -Decrease lactulose to 20 mg TID -Continue Flexi-Seal  Hx SVT Sinus tach Prolonged Qtc, improved Patient found to have SVT on 11/8 requiring amnio bolus and infusion. HR mains stable in the 80s to 90s. -Trend and replete electrolytes -Telemetry, QTc monitoring  Hypokalemia, resolved Hypophosphatemia, resolved. K+ improved to 3.6 with repletion. Mag and Phos within normal limits. -Start KCl 40 mEq BID with scheduled IV Lasix -Daily BMP -Trend electrolytes  Protein caloric malnutrition Tube feeds held due to nausea overnight.  Started on trickle feeds this AM with plan to advance today.  CBGs at goal. -RD following, appreciate assistance -Place cortrak -Advance tube feeds to goal -SSI, CBG monitoring  Best Practice (right click and "Reselect all SmartList Selections" daily)   Diet/type: tubefeeds and NPO DVT prophylaxis: prophylactic heparin  GI prophylaxis: PPI Lines: N/A Foley:  Yes, and  it is still needed Code Status:  full code Last date of multidisciplinary goals of care discussion [updated mother at bedside]  Labs   CBC: Recent Labs  Lab 05/31/22 0500 06/01/22 0520 06/02/22 0453 06/03/22 0421 06/04/22 0329 06/05/22 0345 06/06/22 0325  WBC 14.0* 13.1* 12.4* 8.4 9.6 12.0* 10.4  NEUTROABS 12.3* 11.0* 9.1*  --   --   --   --   HGB 9.8* 9.4* 9.3* 8.2* 8.8* 9.1* 8.5*  HCT 28.1* 28.4* 28.3* 24.7* 26.8* 27.5* 26.9*  MCV 109.8* 113.1* 111.4* 110.3* 112.1* 111.8* 115.9*  PLT 87* 71* 88* 88* 125* 145* 134*    Basic Metabolic Panel: Recent Labs  Lab  06/01/22 0520 06/01/22 1252 06/02/22 0453 06/03/22 0421 06/04/22 0329 06/05/22 0345 06/05/22 1430 06/06/22 0325  NA 144   < > 146* 150* 148* 157* 157* 155*  K 2.7*   < > 3.9 2.8* 4.1 2.8* 3.7 3.6  CL 113*   < > 116* 108 103 110 111 109  CO2 23   < > 24 30 31  35* 36* 34*  GLUCOSE 171*   < > 151* 160* 214* 148* 135* 165*  BUN 16   < > 16 17 19  31* 33* 36*  CREATININE 0.56   < > 0.59 0.55 0.58 0.71 0.64 0.64  CALCIUM 8.3*   < > 8.7* 9.6 10.0 10.9* 10.7* 10.9*  MG 2.3  --  2.2 2.2 2.1 2.2  --  2.2  PHOS 2.6  --   --  2.3* 7.3* 4.2  --  3.4   < > = values in this interval not displayed.   GFR: Estimated Creatinine Clearance: 77.4 mL/min (by C-G formula based on SCr of 0.64 mg/dL). Recent Labs  Lab 05/31/22 0500 05/31/22 1510 06/01/22 0520 06/03/22 0421 06/04/22 0329 06/05/22 0345 06/06/22 0325  PROCALCITON 2.93  --   --   --   --   --   --   WBC 14.0*  --    < > 8.4 9.6 12.0* 10.4  LATICACIDVEN  --  2.2*  --   --   --   --   --    < > = values in this interval not displayed.    Liver Function Tests: Recent Labs  Lab 06/02/22 0453 06/03/22 0421 06/04/22 0329 06/05/22 0345 06/06/22 0325  AST 42* 39 52* 63* 52*  ALT 31 26 23 21 17   ALKPHOS 83 91 103 103 102  BILITOT 3.3* 3.6* 3.7* 3.5* 3.2*  PROT 5.9* 6.2* 6.5 6.7 6.5  ALBUMIN 2.4* 3.3* 3.3* 3.2* 3.0*   No results for input(s): "LIPASE", "AMYLASE" in the last 168 hours. Recent Labs  Lab 06/04/22 0329  AMMONIA 24    ABG    Component Value Date/Time   PHART 7.379 05/30/2022 0453   PCO2ART 34.3 05/30/2022 0453   PO2ART 90 05/30/2022 0453   HCO3 19.9 (L) 05/30/2022 0453   TCO2 21 (L) 05/30/2022 0453   ACIDBASEDEF 4.0 (H) 05/30/2022 0453   O2SAT 96 05/30/2022 0453     Coagulation Profile: No results for input(s): "INR", "PROTIME" in the last 168 hours.   Cardiac Enzymes: No results for input(s): "CKTOTAL", "CKMB", "CKMBINDEX", "TROPONINI" in the last 168 hours.  HbA1C: Hgb A1c MFr Bld  Date/Time  Value Ref Range Status  04/25/2022 03:41 AM <4.2 (L) 4.8 - 5.6 % Final    Comment:    (NOTE) **Verified by repeat analysis**         Prediabetes: 5.7 -  6.4         Diabetes: >6.4         Glycemic control for adults with diabetes: <7.0     CBG: Recent Labs  Lab 06/05/22 1137 06/05/22 1554 06/05/22 1932 06/05/22 2319 06/06/22 0329  GLUCAP 169* 159* 134* 143* 139*    Review of Systems:   As in HPI  Past Medical History:  She,  has no past medical history on file.   Surgical History:   Past Surgical History:  Procedure Laterality Date   KNEE ARTHROSCOPY W/ ACL RECONSTRUCTION Left 1994/5   Dr. Weston Anna   LAPAROSCOPIC APPENDECTOMY N/A 04/11/2013   Procedure: APPENDECTOMY LAPAROSCOPIC;  Surgeon: Stark Klein, MD;  Location: [facility];  Service: General;  Laterality: N/A;   [place]   3 extractions     Social History:   reports that she has never smoked. She does not have any smokeless tobacco history on file. She reports current alcohol use. She reports that she does not use drugs.   Family History:  Her family history is not on file.   Allergies No Known Allergies   Home Medications  Prior to Admission medications   Medication Sig Start Date End Date Taking? Authorizing Provider  acetaminophen (TYLENOL) 325 MG tablet Take 650 mg by mouth every 6 (six) hours as needed for headache or moderate pain.   Yes [provider]  folic acid (FOLVITE) 1 MG tablet Take 1 tablet (1 mg total) by mouth daily. 04/28/22  Yes Hosie Poisson, MD  furosemide (LASIX) 40 MG tablet Take 1 tablet (40 mg total) by mouth daily. 05/12/22 05/12/23 Yes Sheikh, Omair Latif, DO  ibuprofen (ADVIL) 200 MG tablet Take 400 mg by mouth every 6 (six) hours as needed for headache or moderate pain.   Yes [provider]  KLOR-CON M20 20 MEQ tablet Take 20 mEq by mouth daily. 05/14/22  Yes [provider]  lactulose (CHRONULAC) 10 GM/15ML solution Take 30 mLs  (20 g total) by mouth 3 (three) times daily. 04/27/22  Yes Hosie Poisson, MD  Multiple Vitamin (MULTIVITAMIN WITH MINERALS) TABS tablet Take 1 tablet by mouth daily. 04/28/22  Yes Hosie Poisson, MD  pantoprazole (PROTONIX) 40 MG tablet Take 1 tablet (40 mg total) by mouth daily. 04/27/22 04/27/23 Yes Hosie Poisson, MD  polyethylene glycol (MIRALAX / GLYCOLAX) 17 g packet Take 17 g by mouth daily as needed for mild constipation. 05/12/22  Yes Sheikh, Omair Latif, DO  prednisoLONE 5 MG TABS tablet Take 40 mg by mouth daily. 05/14/22  Yes [provider]  predniSONE (DELTASONE) 20 MG tablet Take 40 mg by mouth daily. 04/29/22  Yes [provider]  simethicone (MYLICON) 80 MG chewable tablet Chew 1 tablet (80 mg total) by mouth every 6 (six) hours as needed for flatulence. 04/27/22  Yes Hosie Poisson, MD  spironolactone (ALDACTONE) 50 MG tablet Take 1 tablet (50 mg total) by mouth daily. 05/13/22  Yes Sheikh, Omair Latif, DO  thiamine (VITAMIN B-1) 100 MG tablet Take 1 tablet (100 mg total) by mouth daily. 04/28/22  Yes Hosie Poisson, MD     Critical care time: 42

## 2022-06-06 NOTE — Progress Notes (Signed)
[facility] ADULT ICU REPLACEMENT PROTOCOL   The patient does apply for the [facility] Adult ICU Electrolyte Replacment Protocol based on the criteria listed below:   1.Exclusion criteria: TCTS, ECMO, Dialysis, and Myasthenia Gravis patients 2. Is GFR >/= 30 ml/min? Yes.    Patient's GFR today is >30 3. Is SCr </= 2? Yes.   Patient's SCr is 0.64 mg/dL 4. Did SCr increase >/= 0.5 in 24 hours? No. 5.Pt's weight >40kg  Yes.   6. Abnormal electrolyte(s): K+ 3.6  7. Electrolytes replaced per protocol 8.  Call MD STAT for K+ </= 2.5, Phos </= 1, or Mag </= 1 Physician:  n/a  Deanna Mcmillan 06/06/2022 4:34 AM

## 2022-06-06 NOTE — Progress Notes (Signed)
[facility] for Infectious Disease  Date of Admission:  06/02/2022   Total days of inpatient antibiotics 4  Principal Problem:   Acute respiratory failure with hypoxia (HCC) Active Problems:   Pressure injury of skin          Assessment: 51 YF admitted with:  #Listeria monocytogenes, MSSA bacteremia #Listeria meningitis #Fever #Acute encephalopathy #Hx of Etoh abuse with cirrhosis #Tracheal aspirates+ MSSA - Blood cultures from 11/7 showing 1/2 staph epi(contaminant), MSSA, 2/2 Listeria monocytogenes.  We will do double coverage with ampicillin gentamicin administered. 11/8 Blood Cx NG.  - Per mother at bedside patient had not been feeling well for last few weeks.  On Monday she had abdominal pain throughout the day followed by headache at night but states she was unarousable and presented to the ED.  She was intubated. - I think she would benefit from a LP given Listeria bacteremia and her history of headache along with fevers.  Unclear how long patient has been bacteremia, mother reports patient had not been feeling well since being hospitalized for alcoholic hepatitis 81/0 - 10/6.  She was discharged on steroids 40 mg daily for 28 days she was again hospitalized 10/20 - 10/21 for bilateral lower extremity edema suspected secondary to third spacing, discharged on midodrine. It is possible patient developed SBP then became bacteremic along with meningitis versus she had bacteremia due to multiple healthcare exposures and underlying cirrhosis developing SBP and bacteremia.  Cultures and studies are pending. -TTE poor windows, will get TEE  -CTX stopped as peritoneal fluid with no growth  - LP on 11/10 with ME panel+ listeria, CSF Cx NG, AFB cx pending. Cell count rbc 26.4K, wbc 80(37N, 53%L, 9% mono, 1% eos), >600 PTN, 86 GLC - Eyes open today #Alcoholic cirrhosis with ascites SP paracentesis on 11/8 -50 cc of cloudy-yellowish fluid drained, Cx pending     Recommendations:  -Continue ampicillin and gentamicin for Listeria bacteremia/meningitis -Cefazolin added for MSSA bacteremia/possible PNA -TEE after extubation per cards  #Urine Cx + Ecoli - treated Microbiology:   Antibiotics: Vancomycin 11/7-11/11 Cefazolin 1/11- Metronidazole 11/17- Cefepime 11/17-11/8  Gent 11/8- Ampicillin 11/8-   Cultures: Blood 11/7 1/2 MSSA, staph epi, listeria monocytogenes   11/8 Resp Cx pending, RVP negative Urine Cx + Ecoli  SUBJECTIVE: Intubated, mother at bedside Interval: Afebrile overnight  Review of Systems: Review of Systems  Unable to perform ROS: Intubated  All other systems reviewed and are negative.    Scheduled Meds:  Chlorhexidine Gluconate Cloth  6 each Topical Daily   famotidine  20 mg Per Tube BID   folic acid  1 mg Per Tube Daily   free water  300 mL Per Tube Q2H   furosemide  80 mg Intravenous BID   heparin  5,000 Units Subcutaneous Q8H   insulin aspart  0-15 Units Subcutaneous Q4H   insulin aspart  2 Units Subcutaneous Q4H   lactulose  20 g Per Tube TID   mouth rinse  15 mL Mouth Rinse Q2H   potassium chloride  40 mEq Per Tube BID   spironolactone  50 mg Per Tube Daily   thiamine  100 mg Per Tube Daily   Continuous Infusions:  sodium chloride 5 mL/hr at 06/04/22 2342   ampicillin (OMNIPEN) IV 2 g (06/06/22 1224)    ceFAZolin (ANCEF) IV Stopped (06/06/22 0537)   feeding supplement (VITAL AF 1.2 CAL)     fentaNYL infusion INTRAVENOUS Stopped (06/06/22 0511)  gentamicin Stopped (06/06/22 0700)   propofol (DIPRIVAN) infusion Stopped (06/02/22 1448)   PRN Meds:.sodium chloride, acetaminophen, docusate, fentaNYL, ondansetron (ZOFRAN) IV, mouth rinse, polyethylene glycol No Known Allergies  OBJECTIVE: Vitals:   06/06/22 1200 06/06/22 1215 06/06/22 1230 06/06/22 1245  BP: 125/64  127/73   Pulse: 92 91 90 91  Resp: 18 17 17 20   Temp: 98.2 F (36.8 C) 98.2 F (36.8 C) 98.8 F (37.1 C) 98.6 F (37 C)   TempSrc:      SpO2: 92% 96% 95% 97%  Weight:       Body mass index is 25.35 kg/m.  Physical Exam Constitutional:      Comments: Intubated and sedated.   HENT:     Head: Normocephalic and atraumatic.     Right Ear: Tympanic membrane normal.     Left Ear: Tympanic membrane normal.     Nose: Nose normal.     Mouth/Throat:     Mouth: Mucous membranes are moist.  Eyes:     Extraocular Movements: Extraocular movements intact.     Conjunctiva/sclera: Conjunctivae normal.     Pupils: Pupils are equal, round, and reactive to light.  Cardiovascular:     Rate and Rhythm: Normal rate and regular rhythm.     Heart sounds: No murmur heard.    No friction rub. No gallop.  Pulmonary:     Effort: Pulmonary effort is normal.     Breath sounds: Normal breath sounds.  Abdominal:     General: Abdomen is flat.     Palpations: Abdomen is soft.  Musculoskeletal:        General: Normal range of motion.  Skin:    General: Skin is warm and dry.  Psychiatric:        Mood and Affect: Mood normal.       Lab Results Lab Results  Component Value Date   WBC 10.4 06/06/2022   HGB 8.5 (L) 06/06/2022   HCT 26.9 (L) 06/06/2022   MCV 115.9 (H) 06/06/2022   PLT 134 (L) 06/06/2022    Lab Results  Component Value Date   CREATININE 0.64 06/06/2022   BUN 36 (H) 06/06/2022   NA 155 (H) 06/06/2022   K 3.6 06/06/2022   CL 109 06/06/2022   CO2 34 (H) 06/06/2022    Lab Results  Component Value Date   ALT 17 06/06/2022   AST 52 (H) 06/06/2022   ALKPHOS 102 06/06/2022   BILITOT 3.2 (H) 06/06/2022        Laurice Record, [facility] 06/06/2022, 1:46 PM

## 2022-06-06 NOTE — Progress Notes (Signed)
eLink Physician-Brief Progress Note Patient Name: Deanna Mcmillan DOB: 06-08-1971 MRN: [id number]   Date of Service  06/06/2022  HPI/Events of Note  Nursing concern about moderate tremors - No tremor noted on video exam.  eICU Interventions  Plan: D/C Fentanyl IV infusion and Fentanyl IV bolus from infusion.  Fentanyl 25-50 mcg IV Q 2 hours PRN pain or sedation.      Intervention Category Major Interventions: Other:  Lysle Dingwall 06/06/2022, 9:38 PM

## 2022-06-07 DIAGNOSIS — J9601 Acute respiratory failure with hypoxia: Secondary | ICD-10-CM | POA: Diagnosis not present

## 2022-06-07 LAB — COMPREHENSIVE METABOLIC PANEL
ALT: 17 U/L (ref 0–44)
AST: 66 U/L — ABNORMAL HIGH (ref 15–41)
Albumin: 2.9 g/dL — ABNORMAL LOW (ref 3.5–5.0)
Alkaline Phosphatase: 111 U/L (ref 38–126)
Anion gap: 11 (ref 5–15)
BUN: 39 mg/dL — ABNORMAL HIGH (ref 6–20)
CO2: 31 mmol/L (ref 22–32)
Calcium: 10.6 mg/dL — ABNORMAL HIGH (ref 8.9–10.3)
Chloride: 111 mmol/L (ref 98–111)
Creatinine, Ser: 0.74 mg/dL (ref 0.44–1.00)
GFR, Estimated: >60 mL/min (ref >60)
Glucose, Bld: 129 mg/dL — ABNORMAL HIGH (ref 70–99)
Potassium: 4.3 mmol/L (ref 3.5–5.1)
Sodium: 153 mmol/L — ABNORMAL HIGH (ref 135–145)
Total Bilirubin: 3.3 mg/dL — ABNORMAL HIGH (ref 0.3–1.2)
Total Protein: 6.4 g/dL — ABNORMAL LOW (ref 6.5–8.1)

## 2022-06-07 LAB — CBC
HCT: 27.2 % — ABNORMAL LOW (ref 36.0–46.0)
Hemoglobin: 8.6 g/dL — ABNORMAL LOW (ref 12.0–15.0)
MCH: 37.1 pg — ABNORMAL HIGH (ref 26.0–34.0)
MCHC: 31.6 g/dL (ref 30.0–36.0)
MCV: 117.2 fL — ABNORMAL HIGH (ref 80.0–100.0)
Platelets: 139 10*3/uL — ABNORMAL LOW (ref 150–400)
RBC: 2.32 MIL/uL — ABNORMAL LOW (ref 3.87–5.11)
RDW: 18.1 % — ABNORMAL HIGH (ref 11.5–15.5)
WBC: 11.3 10*3/uL — ABNORMAL HIGH (ref 4.0–10.5)
nRBC: 0.8 % — ABNORMAL HIGH (ref 0.0–0.2)

## 2022-06-07 LAB — GENTAMICIN LEVEL, PEAK: Gentamicin Pk: 6.3 ug/mL (ref 5.0–10.0)

## 2022-06-07 LAB — GLUCOSE, CAPILLARY
Glucose-Capillary: 114 mg/dL — ABNORMAL HIGH (ref 70–99)
Glucose-Capillary: 151 mg/dL — ABNORMAL HIGH (ref 70–99)
Glucose-Capillary: 120 mg/dL — ABNORMAL HIGH (ref 70–99)
Glucose-Capillary: 144 mg/dL — ABNORMAL HIGH (ref 70–99)
Glucose-Capillary: 117 mg/dL — ABNORMAL HIGH (ref 70–99)
Glucose-Capillary: 157 mg/dL — ABNORMAL HIGH (ref 70–99)

## 2022-06-07 LAB — MAGNESIUM: Magnesium: 2.3 mg/dL (ref 1.7–2.4)

## 2022-06-07 LAB — PHOSPHORUS: Phosphorus: 3.9 mg/dL (ref 2.5–4.6)

## 2022-06-07 MED ORDER — FUROSEMIDE 10 MG/ML IJ SOLN
80.0000 mg | Freq: Three times a day (TID) | INTRAMUSCULAR | Status: DC
  Administered 2022-06-07 – 2022-06-09 (×6): 80 mg via INTRAVENOUS
  Filled 2022-06-07 (×6): qty 8

## 2022-06-07 MED ORDER — FUROSEMIDE 10 MG/ML IJ SOLN: 80.0000 mg | Freq: Four times a day (QID) | INTRAMUSCULAR | Status: DC

## 2022-06-07 MED ORDER — LACTULOSE 10 GM/15ML PO SOLN
10.0000 g | Freq: Three times a day (TID) | ORAL | Status: DC
  Administered 2022-06-07 – 2022-06-14 (×21): 10 g
  Filled 2022-06-07 (×20): qty 15

## 2022-06-07 NOTE — Progress Notes (Signed)
PT Cancellation Note  Patient Details Name: Deanna Mcmillan MRN: [id number] DOB: [birth year]   Cancelled Treatment:    Reason Eval/Treat Not Completed: Patient not medically ready  - intubated, sedated.   Stacie Glaze, PT DPT Acute Rehabilitation Services Pager [phone]  Office [phone]   [place] 06/07/2022, 8:06 AM

## 2022-06-07 NOTE — Consult Note (Signed)
Referring Provider: [name] Primary Care Physician:  Mariel Sleet Primary Gastroenterologist:  unassigned  Reason for Consultation: Cirrhosis  HPI: Deanna Mcmillan is a 51 y.o. female medical history significant for alcoholic cirrhosis alcohol abuse, presents for evaluation of cirrhosis  Patient was set to follow-up with [place] but no showed visit.  Has only been seen as a lab visit.  Recently admitted for alcoholic hepatitis 35/70 treated with 28-day course of prednisone.  Bilirubin returned to normal. admitted 11/7 for altered mental status, fever.  Mom stated patient complained of a headache and then went to bed and when she tried to wake her up next morning she would not wake up.  Patient was diagnosed with sepsis, intubated and admitted to the ICU.  CT abdomen pelvis showed cirrhosis with portal hypertension, splenomegaly, moderate volume ascites, moderate bilateral pleural effusion.  KUB showed ileus which resolved with aggressive bowel regimen.  Patient's mother also states that in between the previous admission and this admission patient complained of "looking pregnant."  History of drinking 1 bottle of wine every 2 days for multiple years, no tobacco use.  Family history of fatty liver.  Denies history of NSAIDs  No past medical history on file.  Past Surgical History:  Procedure Laterality Date   KNEE ARTHROSCOPY W/ ACL RECONSTRUCTION Left 1994/5   Dr. Weston Anna   LAPAROSCOPIC APPENDECTOMY N/A 04/11/2013   Procedure: APPENDECTOMY LAPAROSCOPIC;  Surgeon: Stark Klein, MD;  Location: [place];  Service: General;  Laterality: N/A;   [place]   3 extractions    Prior to Admission medications   Medication Sig Start Date End Date Taking? Authorizing Provider  acetaminophen (TYLENOL) 325 MG tablet Take 650 mg by mouth every 6 (six) hours as needed for headache or moderate pain.   Yes [provider]  folic acid (FOLVITE) 1 MG tablet Take 1 tablet  (1 mg total) by mouth daily. 04/28/22  Yes Hosie Poisson, MD  furosemide (LASIX) 40 MG tablet Take 1 tablet (40 mg total) by mouth daily. 05/12/22 05/12/23 Yes Sheikh, Omair Latif, DO  ibuprofen (ADVIL) 200 MG tablet Take 400 mg by mouth every 6 (six) hours as needed for headache or moderate pain.   Yes [provider]  KLOR-CON M20 20 MEQ tablet Take 20 mEq by mouth daily. 05/14/22  Yes [provider]  lactulose (CHRONULAC) 10 GM/15ML solution Take 30 mLs (20 g total) by mouth 3 (three) times daily. 04/27/22  Yes Hosie Poisson, MD  Multiple Vitamin (MULTIVITAMIN WITH MINERALS) TABS tablet Take 1 tablet by mouth daily. 04/28/22  Yes Hosie Poisson, MD  pantoprazole (PROTONIX) 40 MG tablet Take 1 tablet (40 mg total) by mouth daily. 04/27/22 04/27/23 Yes Hosie Poisson, MD  polyethylene glycol (MIRALAX / GLYCOLAX) 17 g packet Take 17 g by mouth daily as needed for mild constipation. 05/12/22  Yes Sheikh, Omair Latif, DO  prednisoLONE 5 MG TABS tablet Take 40 mg by mouth daily. 05/14/22  Yes [provider]  predniSONE (DELTASONE) 20 MG tablet Take 40 mg by mouth daily. 04/29/22  Yes [provider]  simethicone (MYLICON) 80 MG chewable tablet Chew 1 tablet (80 mg total) by mouth every 6 (six) hours as needed for flatulence. 04/27/22  Yes Hosie Poisson, MD  spironolactone (ALDACTONE) 50 MG tablet Take 1 tablet (50 mg total) by mouth daily. 05/13/22  Yes Sheikh, Omair Latif, DO  thiamine (VITAMIN B-1) 100 MG tablet Take 1 tablet (100 mg total) by mouth  daily. 04/28/22  Yes Hosie Poisson, MD    Scheduled Meds:  Chlorhexidine Gluconate Cloth  6 each Topical Daily   famotidine  20 mg Per Tube BID   folic acid  1 mg Per Tube Daily   free water  300 mL Per Tube Q2H   furosemide  80 mg Intravenous Q8H   heparin  5,000 Units Subcutaneous Q8H   insulin aspart  0-15 Units Subcutaneous Q4H   insulin aspart  2 Units Subcutaneous Q4H   lactulose  10 g Per Tube TID   mouth rinse   15 mL Mouth Rinse Q2H   potassium chloride  40 mEq Per Tube BID   spironolactone  50 mg Per Tube Daily   thiamine  100 mg Per Tube Daily   Continuous Infusions:  sodium chloride 10 mL/hr at 06/07/22 1300   ampicillin (OMNIPEN) IV 300 mL/hr at 06/07/22 1300    ceFAZolin (ANCEF) IV 2 g (06/07/22 0452)   feeding supplement (VITAL AF 1.2 CAL) 30 mL/hr at 06/07/22 1300   gentamicin 40 mg (06/07/22 0449)   propofol (DIPRIVAN) infusion Stopped (06/02/22 1448)   PRN Meds:.sodium chloride, acetaminophen, docusate, fentaNYL (SUBLIMAZE) injection, mouth rinse, polyethylene glycol  Allergies as of 05/27/2022   (No Known Allergies)    No family history on file.  Social History   Socioeconomic History   Marital status: Divorced    Spouse name: Not on file   Number of children: Not on file   Years of education: Not on file   Highest education level: Not on file  Occupational History   Not on file  Tobacco Use   Smoking status: Never   Smokeless tobacco: Not on file  Substance and Sexual Activity   Alcohol use: Yes    Comment: daily one glass of wine   Drug use: No   Sexual activity: Not on file  Other Topics Concern   Not on file  Social History Narrative   Not on file   Social Determinants of Health   Financial Resource Strain: Not on file  Food Insecurity: No Food Insecurity (05/11/2022)   Hunger Vital Sign    Worried About Running Out of Food in the Last Year: Never true    Ran Out of Food in the Last Year: Never true  Transportation Needs: No Transportation Needs (05/11/2022)   PRAPARE - Hydrologist (Medical): No    Lack of Transportation (Non-Medical): No  Physical Activity: Not on file  Stress: Not on file  Social Connections: Not on file  Intimate Partner Violence: Not At Risk (05/11/2022)   Humiliation, Afraid, Rape, and Kick questionnaire    Fear of Current or Ex-Partner: No    Emotionally Abused: No    Physically Abused: No     Sexually Abused: No    Review of Systems: Review of Systems  Unable to perform ROS: Critical illness     Physical Exam:Physical Exam Constitutional:      Appearance: She is ill-appearing.     Comments: Intubated, sedated  HENT:     Head: Normocephalic and atraumatic.     Nose: Nose normal. No congestion.     Mouth/Throat:     Comments: EENT Cardiovascular:     Rate and Rhythm: Normal rate and regular rhythm.  Pulmonary:     Comments: Ventilated breath sounds Abdominal:     General: Bowel sounds are normal.     Comments: Ascites noted  Musculoskeletal:     Cervical  back: Normal range of motion and neck supple.  Skin:    General: Skin is warm.     Coloration: Skin is not jaundiced.     Comments: ecchymoses  Neurological:     Comments: Tracks and seems purposeful, but does not follow commands. Very weak.      Vital signs: Vitals:   06/07/22 1200 06/07/22 1300  BP: 112/62 120/61  Pulse: 95 94  Resp: (!) 22 17  Temp: 99.7 F (37.6 C) 99.5 F (37.5 C)  SpO2: 96% 91%   Last BM Date : 06/05/22    GI:  Lab Results: Recent Labs    06/05/22 0345 06/06/22 0325 06/07/22 0320  WBC 12.0* 10.4 11.3*  HGB 9.1* 8.5* 8.6*  HCT 27.5* 26.9* 27.2*  PLT 145* 134* 139*   BMET Recent Labs    06/05/22 1430 06/06/22 0325 06/07/22 0320  NA 157* 155* 153*  K 3.7 3.6 4.3  CL 111 109 111  CO2 36* 34* 31  GLUCOSE 135* 165* 129*  BUN 33* 36* 39*  CREATININE 0.64 0.64 0.74  CALCIUM 10.7* 10.9* 10.6*   LFT Recent Labs    06/07/22 0320  PROT 6.4*  ALBUMIN 2.9*  AST 66*  ALT 17  ALKPHOS 111  BILITOT 3.3*   PT/INR No results for input(s): "LABPROT", "INR" in the last 72 hours.   Studies/Results: DG Abd Portable 1V  Result Date: 06/06/2022 CLINICAL DATA:  Feeding tube placement. EXAM: PORTABLE ABDOMEN - 1 VIEW COMPARISON:  None Available. FINDINGS: Distal tip of feeding tube is seen in expected position of distal stomach. IMPRESSION: Distal tip of feeding tube  seen in expected position of distal stomach. Electronically Signed   By: Marijo Conception M.D.   On: 06/06/2022 15:11   DG Abd 1 View  Result Date: 06/06/2022 CLINICAL DATA:  Nausea and vomiting. Assess nasogastric tube placement. EXAM: ABDOMEN - 1 VIEW COMPARISON:  Radiographs 06/02/2022 and 05/31/2022.  CT 06/06/2022. FINDINGS: At 0801 hours. Enteric tube has been slightly advanced, tip overlying the left mid abdomen, likely in the mid stomach. Previously demonstrated bowel distension has improved. Persistent bibasilar atelectasis and a probable small left pleural effusion. IMPRESSION: Enteric tube tip is in the mid stomach. Electronically Signed   By: Richardean Sale M.D.   On: 06/06/2022 08:13    Impression: Cirrhosis AST 66 ALT 17  Alkphos 111 TBili 3.3 INR 05/30/2022 1.5  MELD 3.0: 19 at 06/01/2022  2:53 PM MELD-Na: 16 at 06/01/2022  2:53 PM Calculated from: Serum Creatinine: 0.64 mg/dL (Using min of 1 mg/dL) at 06/01/2022  2:53 PM Serum Sodium: 144 mmol/L (Using max of 137 mmol/L) at 06/01/2022  2:53 PM Total Bilirubin: 3.8 mg/dL at 06/01/2022  5:20 AM Serum Albumin: 2.6 g/dL at 06/01/2022  5:20 AM INR(ratio): 1.5 at 05/30/2022 12:40 AM Age at listing (hypothetical): 51 years Sex: Female at 06/01/2022  2:53 PM Hgb 8.6, stable Leukocytosis 11.3    Plan: Could consider diagnostic and therapeutic paracentesis with presence of ascites, however, with patient's current condition unsure if this is something that needs to be done right now.  However, if paracentesis is done would recommend fluid testing to rule out SBP. Continue spironolactone 50 Mg Continue lactulose 10 G3 times daily Continue Lasix 80 mg every 8 hours Continue supportive care [facility] will follow    LOS: 9 days   Debrah Granderson Radford Pax  PA-C 06/07/2022, 2:26 PM  Contact #  [phone]

## 2022-06-07 NOTE — Progress Notes (Signed)
NAME:  Deanna Mcmillan, MRN:  [id number], DOB:  1970-09-26, LOS: 9 ADMISSION DATE:  06/16/2022, CONSULTATION DATE:  11/7 REFERRING MD:  Lendon Colonel, EDP CHIEF COMPLAINT:  AMS   History of Present Illness:  51 yo woman with hx of recent diagnosis of alcoholic hepatitis (dx 51/88), here with ams, fever.   History from [name].  Patient was in her normal state of health, until suddenly last night she developed a headache.  She went to sleep to rest.  Today she slept throughout the day, this evening her mother went to wake her and was unable to wake her up, so she called 911.     She has been doing well since her recent hospital discharge, taking all the meds as prescribed, no new meds.  Planned follow up with GI on 11/14.  LE edema had been improving.  No cough, sob no focal symptoms.  No known sick contacts.     In ED, febrile, tachycardic, lethargic, desaturating to 70s.   Intubated by ED physician.    Had been admitted earlier in October and started on course of prenisone for alc hepatitis.    Recently seen at [facility] 10/19: LE Edema. Lasix not helpful at home.  Admitted for diuresis.  Jaundice and abd distension also noted.  Pleural effusion.  GI saw: non enough ascites for tap  Started lasix and spironolactone.  28 day course of prednisolone, miralax, lactulose, miralax. Midodrine TID    In ED got cefepime, LR 1L, flagyl ordered, intubated, started on Propofol, vanc ordered   Pertinent  Medical History  ETOH Cirrhosis BLE edema recently worsening  S/p lap appy 2014    Echo: recent normal 41/6606   Home meds: folic ascid, acetaminohpen, lasix 40bid, lactulose 30 TID protonix, prednisone (04/29/22), prednisolone 04/28/22 three week course, thiamine, simethicone,    Per her mother she had been drinking at least one bottle of wine daily, she isnt clear exactly how much she was drinking but she did also drink [place].  She notes she had wanted to stop recently but been unable to.   She thinks she was depressed after experiencing perimenopause.  No tobacco.   Significant Hospital Events: Including procedures, antibiotic start and stop dates in addition to other pertinent events   11/7: Intubated in ED and admitted to ICU 11/7: [place] neg, CT A/P cirrhosis with portal hypertension, splenomegaly, moderate volume ascites, moderate bilateral pleural effusion 11/9 KUB: No ileus 11/10: LP confirms listeria meningitis 11/11 KUB: ileus 11/12 ileus resolved with aggressive bowel regimen 11/15 KUB: No ileus.  Interim History / Subjective:  RN notice patient having some intermittent tremors overnight. Fentanyl infusion discontinued and started on as needed fentanyl Remains awake, tracking but still not following commands to squeeze fingers  Objective   Blood pressure 114/61, pulse 93, temperature 99.9 F (37.7 C), resp. rate 20, weight 70.3 kg, SpO2 94 %.    Vent Mode: PRVC FiO2 (%):  [40 %] 40 % Set Rate:  [20 bmp] 20 bmp Vt Set:  [420 mL] 420 mL PEEP:  [5 cmH20] 5 cmH20 Pressure Support:  [10 cmH20] 10 cmH20 Plateau Pressure:  [14 cmH20-16 cmH20] 15 cmH20   Intake/Output Summary (Last 24 hours) at 06/07/2022 0741 Last data filed at 06/07/2022 0700 Gross per 24 hour  Intake 4835.02 ml  Output 3665 ml  Net 1170.02 ml   Filed Weights   06/04/22 0500 06/06/22 0500 06/07/22 0454  Weight: 75.2 kg 67 kg 70.3 kg  Examination: General: Acutely ill middle-aged woman laying in bed. NAD. HENT: [place]/AT. ET tube in place. Scleral icterus. Conjunctival injection and edema improved.  PERRLA. Lungs: Mechanical lung sounds.  No increased WOB. Cardiovascular: Mild tachycardia. Regular rhythm. 2+ BLE pitting edema, worse today Abdomen: Soft. Mildly distended. Hypoactive bowel sounds Extremities: Well perfused. Warm and dry. Skin: Multiple bruises on the upper extremities, improving Neuro: Off sedation. Awake with eyes open. Tracking but not following any commands. GU: Foley  and Flexi-Seal in place  Sodium 157->155->153, K+ 4.3, creatinine 0.74, calcium 10.6 WBC 11.3, Hgb 8.6, platelet 139 CBGs 110s to 150s  2.4 L of [facility] Problem list   Ileus  Assessment & Plan:   Severe sepsis Acute metabolic encephalopathy Listeria, MSSA bacteremia Listeria meningitis Continues to be on broad-spectrum antibiotics. Repeat blood culture on 11/8 no growth after 5 days.  Mild leukocytosis, afebrile overnight. Remains critically ill however awake with eyes open.  Follows commands to take deep breaths but unable to squeeze fingers. -ID following, appreciate recs -Continue Ampicillin, gentamicin and Ancef -Will consider CT head if mental status does not improve off sedation -Trend CBC/fever curve  -Pending TEE after extubation   Acute hypoxic respiratory failure CAP versus aspiration pneumonia Compressive atelectasis Remains on full vent support overnight. Briefly failed SBT this morning but currently weaning at 10/5.  Patient net positive in the last 24 hours.  We will increase IV diuresing today and monitor closely.  Weaning on 12/5 this morning.  If unable to extubate by tomorrow, will consider trach. -Continue low tidal volume ventilation -Continue SBT -Continue aggressive diuresing -Continue antibiotics as above  Fluid overloaded TTE during this admission unremarkable.  UOP of 2.4 L in last 24 hours.  However patient net +1 L in the last 24 hours. Looks more edematous this morning. -Increase IV Lasix to 80 mg Q8H -Monitor BMP/UOP -Strict I&O's/daily weights  Hypernatremia Likely multifactorial in the setting of insensible losses, diuretic and GI losses. Free water deficit of 4.6 L.  Sodium trending down to 153. -Continue Free water flushes 300 cc q2h -Trend BMP  Alcoholic cirrhosis Hepatic encephalopathy? Recently diagnosed cirrhosis during last hospitalization.  Concern for possible hepatic encephalopathy and possible SBP on admission.  MELD Na score of 17. Child Pugh Class C. FIB-4 score of 7.46 indicates advance fibrosis . Peritoneal labs not consistent with SBP, however, patient received abx prior to tap.  Patient continues to have watery stools 1.2 L of stool output in last 24 hours. -Continue IV Lasix as above -Continue home spironolactone -Cont thiamine, folate., MVI -Decrease lactulose to 10 mg TID, goal 2-3 BM  Diarrhea History of recent ileus.  Patient continues to have watery stools up to 1.2 L in the last 24 hours.  We will decrease dose of lactulose and monitor closely. -Continue to hold bowel regiment -Decrease lactulose to 10 mg TID -Continue Flexi-Seal  Hx SVT Sinus tach Prolonged Qtc, improved Patient found to have SVT on 11/8 requiring amnio bolus and infusion. HR mains stable in the 80s to 90s. -Trend and replete electrolytes -Telemetry, QTc monitoring  Hypokalemia, resolved Hypophosphatemia, resolved. K+ improved to 4.5.. Mag and Phos within normal limits. -Continue KCl 40 mEq BID with scheduled IV Lasix -Daily BMP -Trend electrolytes  Protein caloric malnutrition Cortrak placed yesterday.  CBGs stable in the 110s to 150s.  Tube feed remains at goal. -RD following, appreciate assistance -Continue tube feeds -SSI, CBG monitoring  Best Practice (right click and "Reselect all SmartList Selections" daily)  Diet/type: tubefeeds and NPO DVT prophylaxis: prophylactic heparin  GI prophylaxis: PPI Lines: N/A Foley:  Yes, and it is still needed Code Status:  full code Last date of multidisciplinary goals of care discussion [updated mother at bedside]  Labs   CBC: Recent Labs  Lab 06/01/22 0520 06/02/22 0453 06/03/22 0421 06/04/22 0329 06/05/22 0345 06/06/22 0325 06/07/22 0320  WBC 13.1* 12.4* 8.4 9.6 12.0* 10.4 11.3*  NEUTROABS 11.0* 9.1*  --   --   --   --   --   HGB 9.4* 9.3* 8.2* 8.8* 9.1* 8.5* 8.6*  HCT 28.4* 28.3* 24.7* 26.8* 27.5* 26.9* 27.2*  MCV 113.1* 111.4* 110.3* 112.1*  111.8* 115.9* 117.2*  PLT 71* 88* 88* 125* 145* 134* 139*    Basic Metabolic Panel: Recent Labs  Lab 06/03/22 0421 06/04/22 0329 06/05/22 0345 06/05/22 1430 06/06/22 0325 06/07/22 0320  NA 150* 148* 157* 157* 155* 153*  K 2.8* 4.1 2.8* 3.7 3.6 4.3  CL 108 103 110 111 109 111  CO2 30 31 35* 36* 34* 31  GLUCOSE 160* 214* 148* 135* 165* 129*  BUN 17 19 31* 33* 36* 39*  CREATININE 0.55 0.58 0.71 0.64 0.64 0.74  CALCIUM 9.6 10.0 10.9* 10.7* 10.9* 10.6*  MG 2.2 2.1 2.2  --  2.2 2.3  PHOS 2.3* 7.3* 4.2  --  3.4 3.9   GFR: Estimated Creatinine Clearance: 79.1 mL/min (by C-G formula based on SCr of 0.74 mg/dL). Recent Labs  Lab 05/31/22 1510 06/01/22 0520 06/04/22 0329 06/05/22 0345 06/06/22 0325 06/07/22 0320  WBC  --    < > 9.6 12.0* 10.4 11.3*  LATICACIDVEN 2.2*  --   --   --   --   --    < > = values in this interval not displayed.    Liver Function Tests: Recent Labs  Lab 06/03/22 0421 06/04/22 0329 06/05/22 0345 06/06/22 0325 06/07/22 0320  AST 39 52* 63* 52* 66*  ALT 26 23 21 17 17   ALKPHOS 91 103 103 102 111  BILITOT 3.6* 3.7* 3.5* 3.2* 3.3*  PROT 6.2* 6.5 6.7 6.5 6.4*  ALBUMIN 3.3* 3.3* 3.2* 3.0* 2.9*   No results for input(s): "LIPASE", "AMYLASE" in the last 168 hours. Recent Labs  Lab 06/04/22 0329  AMMONIA 24    ABG    Component Value Date/Time   PHART 7.379 05/30/2022 0453   PCO2ART 34.3 05/30/2022 0453   PO2ART 90 05/30/2022 0453   HCO3 19.9 (L) 05/30/2022 0453   TCO2 21 (L) 05/30/2022 0453   ACIDBASEDEF 4.0 (H) 05/30/2022 0453   O2SAT 96 05/30/2022 0453     Coagulation Profile: No results for input(s): "INR", "PROTIME" in the last 168 hours.   Cardiac Enzymes: No results for input(s): "CKTOTAL", "CKMB", "CKMBINDEX", "TROPONINI" in the last 168 hours.  HbA1C: Hgb A1c MFr Bld  Date/Time Value Ref Range Status  04/25/2022 03:41 AM <4.2 (L) 4.8 - 5.6 % Final    Comment:    (NOTE) **Verified by repeat analysis**          Prediabetes: 5.7 - 6.4         Diabetes: >6.4         Glycemic control for adults with diabetes: <7.0     CBG: Recent Labs  Lab 06/06/22 1544 06/06/22 1933 06/06/22 2319 06/07/22 0317 06/07/22 0720  GLUCAP 132* 124* 152* 114* 151*    Review of Systems:   As in HPI  Past Medical History:  She,  has no  past medical history on file.   Surgical History:   Past Surgical History:  Procedure Laterality Date   KNEE ARTHROSCOPY W/ ACL RECONSTRUCTION Left 1994/5   Dr. Weston Anna   LAPAROSCOPIC APPENDECTOMY N/A 04/11/2013   Procedure: APPENDECTOMY LAPAROSCOPIC;  Surgeon: Stark Klein, MD;  Location: [place];  Service: General;  Laterality: N/A;   [place]   3 extractions     Social History:   reports that she has never smoked. She does not have any smokeless tobacco history on file. She reports current alcohol use. She reports that she does not use drugs.   Family History:  Her family history is not on file.   Allergies No Known Allergies   Home Medications  Prior to Admission medications   Medication Sig Start Date End Date Taking? Authorizing Provider  acetaminophen (TYLENOL) 325 MG tablet Take 650 mg by mouth every 6 (six) hours as needed for headache or moderate pain.   Yes [provider]  folic acid (FOLVITE) 1 MG tablet Take 1 tablet (1 mg total) by mouth daily. 04/28/22  Yes Hosie Poisson, MD  furosemide (LASIX) 40 MG tablet Take 1 tablet (40 mg total) by mouth daily. 05/12/22 05/12/23 Yes Sheikh, Omair Latif, DO  ibuprofen (ADVIL) 200 MG tablet Take 400 mg by mouth every 6 (six) hours as needed for headache or moderate pain.   Yes [provider]  KLOR-CON M20 20 MEQ tablet Take 20 mEq by mouth daily. 05/14/22  Yes [provider]  lactulose (CHRONULAC) 10 GM/15ML solution Take 30 mLs (20 g total) by mouth 3 (three) times daily. 04/27/22  Yes Hosie Poisson, MD  Multiple Vitamin (MULTIVITAMIN WITH MINERALS) TABS tablet  Take 1 tablet by mouth daily. 04/28/22  Yes Hosie Poisson, MD  pantoprazole (PROTONIX) 40 MG tablet Take 1 tablet (40 mg total) by mouth daily. 04/27/22 04/27/23 Yes Hosie Poisson, MD  polyethylene glycol (MIRALAX / GLYCOLAX) 17 g packet Take 17 g by mouth daily as needed for mild constipation. 05/12/22  Yes Sheikh, Omair Latif, DO  prednisoLONE 5 MG TABS tablet Take 40 mg by mouth daily. 05/14/22  Yes [provider]  predniSONE (DELTASONE) 20 MG tablet Take 40 mg by mouth daily. 04/29/22  Yes [provider]  simethicone (MYLICON) 80 MG chewable tablet Chew 1 tablet (80 mg total) by mouth every 6 (six) hours as needed for flatulence. 04/27/22  Yes Hosie Poisson, MD  spironolactone (ALDACTONE) 50 MG tablet Take 1 tablet (50 mg total) by mouth daily. 05/13/22  Yes Sheikh, Omair Latif, DO  thiamine (VITAMIN B-1) 100 MG tablet Take 1 tablet (100 mg total) by mouth daily. 04/28/22  Yes Hosie Poisson, MD     Critical care time: 72

## 2022-06-08 ENCOUNTER — Inpatient Hospital Stay (HOSPITAL_COMMUNITY): Payer: 59

## 2022-06-08 DIAGNOSIS — J9601 Acute respiratory failure with hypoxia: Secondary | ICD-10-CM | POA: Diagnosis not present

## 2022-06-08 DIAGNOSIS — G928 Other toxic encephalopathy: Secondary | ICD-10-CM

## 2022-06-08 LAB — CBC WITH DIFFERENTIAL/PLATELET
Abs Immature Granulocytes: 0.18 10*3/uL — ABNORMAL HIGH (ref 0.00–0.07)
Basophils Absolute: 0 10*3/uL (ref 0.0–0.1)
Basophils Relative: 0 %
Eosinophils Absolute: 0.1 10*3/uL (ref 0.0–0.5)
Eosinophils Relative: 1 %
HCT: 25.5 % — ABNORMAL LOW (ref 36.0–46.0)
Hemoglobin: 8.4 g/dL — ABNORMAL LOW (ref 12.0–15.0)
Immature Granulocytes: 2 %
Lymphocytes Relative: 15 %
Lymphs Abs: 1.6 10*3/uL (ref 0.7–4.0)
MCH: 37.8 pg — ABNORMAL HIGH (ref 26.0–34.0)
MCHC: 32.9 g/dL (ref 30.0–36.0)
MCV: 114.9 fL — ABNORMAL HIGH (ref 80.0–100.0)
Monocytes Absolute: 0.6 10*3/uL (ref 0.1–1.0)
Monocytes Relative: 6 %
Neutro Abs: 8.2 10*3/uL — ABNORMAL HIGH (ref 1.7–7.7)
Neutrophils Relative %: 76 %
Platelets: 125 10*3/uL — ABNORMAL LOW (ref 150–400)
RBC: 2.22 MIL/uL — ABNORMAL LOW (ref 3.87–5.11)
RDW: 18 % — ABNORMAL HIGH (ref 11.5–15.5)
WBC: 10.7 10*3/uL — ABNORMAL HIGH (ref 4.0–10.5)
nRBC: 0.6 % — ABNORMAL HIGH (ref 0.0–0.2)

## 2022-06-08 LAB — GLUCOSE, CAPILLARY
Glucose-Capillary: 128 mg/dL — ABNORMAL HIGH (ref 70–99)
Glucose-Capillary: 142 mg/dL — ABNORMAL HIGH (ref 70–99)
Glucose-Capillary: 121 mg/dL — ABNORMAL HIGH (ref 70–99)
Glucose-Capillary: 151 mg/dL — ABNORMAL HIGH (ref 70–99)
Glucose-Capillary: 118 mg/dL — ABNORMAL HIGH (ref 70–99)
Glucose-Capillary: 134 mg/dL — ABNORMAL HIGH (ref 70–99)

## 2022-06-08 LAB — COMPREHENSIVE METABOLIC PANEL
ALT: 18 U/L (ref 0–44)
AST: 69 U/L — ABNORMAL HIGH (ref 15–41)
Albumin: 2.7 g/dL — ABNORMAL LOW (ref 3.5–5.0)
Alkaline Phosphatase: 105 U/L (ref 38–126)
Anion gap: 9 (ref 5–15)
BUN: 37 mg/dL — ABNORMAL HIGH (ref 6–20)
CO2: 29 mmol/L (ref 22–32)
Calcium: 9.5 mg/dL (ref 8.9–10.3)
Chloride: 105 mmol/L (ref 98–111)
Creatinine, Ser: 0.68 mg/dL (ref 0.44–1.00)
GFR, Estimated: >60 mL/min (ref >60)
Glucose, Bld: 160 mg/dL — ABNORMAL HIGH (ref 70–99)
Potassium: 3.2 mmol/L — ABNORMAL LOW (ref 3.5–5.1)
Sodium: 143 mmol/L (ref 135–145)
Total Bilirubin: 3 mg/dL — ABNORMAL HIGH (ref 0.3–1.2)
Total Protein: 6.1 g/dL — ABNORMAL LOW (ref 6.5–8.1)

## 2022-06-08 LAB — TRIGLYCERIDES: Triglycerides: 86 mg/dL (ref <150)

## 2022-06-08 LAB — GENTAMICIN LEVEL, TROUGH: Gentamicin Trough: 1.1 ug/mL (ref 0.5–2.0)

## 2022-06-08 MED ORDER — GENTAMICIN SULFATE 40 MG/ML IJ SOLN
20.0000 mg | Freq: Two times a day (BID) | INTRAVENOUS | Status: DC
  Administered 2022-06-08 – 2022-06-12 (×8): 20 mg via INTRAVENOUS
  Filled 2022-06-08 (×9): qty 0.5

## 2022-06-08 MED ORDER — POTASSIUM CHLORIDE 10 MEQ/100ML IV SOLN
10.0000 meq | INTRAVENOUS | Status: AC
  Administered 2022-06-08 (×4): 10 meq via INTRAVENOUS
  Filled 2022-06-08 (×4): qty 100

## 2022-06-08 NOTE — Progress Notes (Signed)
[facility]  Date of Admission:  06/05/2022   Total days of inpatient antibiotics 10  Principal Problem:   Acute respiratory failure with hypoxia (HCC) Active Problems:   Pressure injury of skin          Assessment: 51 YF admitted with:  #Listeria monocytogenes, MSSA bacteremia #Listeria meningitis #Fever #Acute encephalopathy #Hx of Etoh abuse with cirrhosis #Tracheal aspirates+ MSSA - Blood cultures from 11/7 showing 1/2 staph epi(contaminant), MSSA, 2/2 Listeria monocytogenes.  We will do double coverage with ampicillin gentamicin administered. 11/8 Blood Cx NG.  - Per mother at bedside patient had not been feeling well for last few weeks.  On Monday she had abdominal pain throughout the day followed by headache at night but states she was unarousable and presented to the ED.  She was intubated. - I think she would benefit from a LP given Listeria bacteremia and her history of headache along with fevers.  Unclear how long patient has been bacteremia, mother reports patient had not been feeling well since being hospitalized for alcoholic hepatitis 38/1 - 10/6.  She was discharged on steroids 40 mg daily for 28 days she was again hospitalized 10/20 - 10/21 for bilateral lower extremity edema suspected secondary to third spacing, discharged on midodrine. It is possible patient developed SBP then became bacteremic along with meningitis versus she had bacteremia due to multiple healthcare exposures and underlying cirrhosis developing SBP and bacteremia.  Cultures and studies are pending. -TTE poor windows  -CTX stopped as peritoneal fluid with no growth  - LP on 11/10 with ME panel+ listeria, CSF Cx NG, AFB cx pending. Cell count rbc 26.4K, wbc 80(37N, 53%L, 9% mono, 1% eos), >600 PTN, 49 GLC -Possible trach as pt continues to require mechanical ventilation   #Alcoholic cirrhosis with ascites SP paracentesis on 11/8 -50 cc of cloudy-yellowish fluid  drained, Cx NG    Recommendations:  -Continue ampicillin and gentamicin for Listeria bacteremia/meningitis -Cefazolin added for MSSA bacteremia/possible PNA -TEE after extubation per cards, (Awaiting TEE for antibiotic duration/further management) -As a side note, mother states [facility] contacted per Listeria. Mother notes that she had sushi at [employer] in [place] 2 weeks prior to admission.   #Urine Cx + Ecoli - treated Microbiology:   Antibiotics: Vancomycin 11/7-11/11 Cefazolin 1/11- Metronidazole 11/17- Cefepime 11/17-11/8  Gent 11/8- Ampicillin 11/8-   Cultures: Blood 11/7 1/2 MSSA, staph epi, listeria monocytogenes   11/8 Resp Cx pending, RVP negative Urine Cx + Ecoli  SUBJECTIVE: Intubated, mother at bedside Interval: Afebrile overnight  Review of Systems: Review of Systems  Unable to perform ROS: Intubated  All other systems reviewed and are negative.    Scheduled Meds:  Chlorhexidine Gluconate Cloth  6 each Topical Daily   famotidine  20 mg Per Tube BID   folic acid  1 mg Per Tube Daily   furosemide  80 mg Intravenous Q8H   heparin  5,000 Units Subcutaneous Q8H   insulin aspart  0-15 Units Subcutaneous Q4H   insulin aspart  2 Units Subcutaneous Q4H   lactulose  10 g Per Tube TID   mouth rinse  15 mL Mouth Rinse Q2H   potassium chloride  40 mEq Per Tube BID   spironolactone  50 mg Per Tube Daily   thiamine  100 mg Per Tube Daily   Continuous Infusions:  sodium chloride Stopped (06/08/22 0544)   ampicillin (OMNIPEN) IV 2 g (06/08/22 0834)  ceFAZolin (ANCEF) IV Stopped (06/08/22 0555)   feeding supplement (VITAL AF 1.2 CAL) 30 mL/hr at 06/08/22 0600   gentamicin     potassium chloride 10 mEq (06/08/22 0843)   propofol (DIPRIVAN) infusion Stopped (06/02/22 1448)   PRN Meds:.sodium chloride, acetaminophen, docusate, fentaNYL (SUBLIMAZE) injection, mouth rinse, polyethylene glycol No Known Allergies  OBJECTIVE: Vitals:   06/08/22 0430  06/08/22 0500 06/08/22 0530 06/08/22 0753  BP: (!) 102/54 (!) 95/54 (!) 98/58   Pulse: 78 76 74 80  Resp: 20 20 16 19   Temp:    98.3 F (36.8 C)  TempSrc:    Oral  SpO2: 100% 100% 100% 100%  Weight:  73.4 kg     Body mass index is 27.78 kg/m.  Physical Exam Constitutional:      Comments: Intubated and sedated.   HENT:     Head: Normocephalic and atraumatic.     Right Ear: Tympanic membrane normal.     Left Ear: Tympanic membrane normal.     Nose: Nose normal.     Mouth/Throat:     Mouth: Mucous membranes are moist.  Eyes:     Extraocular Movements: Extraocular movements intact.     Conjunctiva/sclera: Conjunctivae normal.     Pupils: Pupils are equal, round, and reactive to light.  Cardiovascular:     Rate and Rhythm: Normal rate and regular rhythm.     Heart sounds: No murmur heard.    No friction rub. No gallop.  Pulmonary:     Effort: Pulmonary effort is normal.     Breath sounds: Normal breath sounds.  Abdominal:     General: Abdomen is flat.     Palpations: Abdomen is soft.  Musculoskeletal:        General: Normal range of motion.  Skin:    General: Skin is warm and dry.  Psychiatric:        Mood and Affect: Mood normal.       Lab Results Lab Results  Component Value Date   WBC 10.7 (H) 06/08/2022   HGB 8.4 (L) 06/08/2022   HCT 25.5 (L) 06/08/2022   MCV 114.9 (H) 06/08/2022   PLT 125 (L) 06/08/2022    Lab Results  Component Value Date   CREATININE 0.68 06/08/2022   BUN 37 (H) 06/08/2022   NA 143 06/08/2022   K 3.2 (L) 06/08/2022   CL 105 06/08/2022   CO2 29 06/08/2022    Lab Results  Component Value Date   ALT 18 06/08/2022   AST 69 (H) 06/08/2022   ALKPHOS 105 06/08/2022   BILITOT 3.0 (H) 06/08/2022        Laurice Record, [facility] 06/08/2022, 10:02 AM

## 2022-06-08 NOTE — Progress Notes (Signed)
NAME:  Deanna Mcmillan, MRN:  [id number], DOB:  [birth year], LOS: 88 ADMISSION DATE:  05/28/2022, CONSULTATION DATE:  11/7 REFERRING MD:  Lendon Colonel, EDP CHIEF COMPLAINT:  AMS   History of Present Illness:  51 yo woman with hx of recent diagnosis of alcoholic hepatitis (dx 62/03), here with ams, fever.   History from her mother Deanna Mcmillan.  Patient was in her normal state of health, until suddenly last night she developed a headache.  She went to sleep to rest.  Today she slept throughout the day, this evening her mother went to wake her and was unable to wake her up, so she called 911.     She has been doing well since her recent hospital discharge, taking all the meds as prescribed, no new meds.  Planned follow up with GI on 11/14.  LE edema had been improving.  No cough, sob no focal symptoms.  No known sick contacts.     In ED, febrile, tachycardic, lethargic, desaturating to 70s.   Intubated by ED physician.    Had been admitted earlier in October and started on course of prenisone for alc hepatitis.    Recently seen at [facility] 10/19: LE Edema. Lasix not helpful at home.  Admitted for diuresis.  Jaundice and abd distension also noted.  Pleural effusion.  GI saw: non enough ascites for tap  Started lasix and spironolactone.  28 day course of prednisolone, miralax, lactulose, miralax. Midodrine TID    In ED got cefepime, LR 1L, flagyl ordered, intubated, started on Propofol, vanc ordered   Pertinent  Medical History  ETOH Cirrhosis BLE edema recently worsening  S/p lap appy 2014    Echo: recent normal [id number]/9741   Home meds: folic ascid, acetaminohpen, lasix 40bid, lactulose 30 TID protonix, prednisone (04/29/22), prednisolone 04/28/22 three week course, thiamine, simethicone,    Per her mother she had been drinking at least one bottle of wine daily, she isnt clear exactly how much she was drinking but she did also drink [place].  She notes she had wanted to stop recently but been unable to.   She thinks she was depressed after experiencing perimenopause.  No tobacco.   Significant Hospital Events: Including procedures, antibiotic start and stop dates in addition to other pertinent events   11/7: Intubated in ED and admitted to ICU 11/7: [name] neg, CT A/P cirrhosis with portal hypertension, splenomegaly, moderate volume ascites, moderate bilateral pleural effusion 11/9 KUB: No ileus 11/10: LP confirms listeria meningitis 11/11 KUB: ileus 11/12 ileus resolved with aggressive bowel regimen 11/15 KUB: No ileus.  Interim History / Subjective:  No acute events overnight Patient evaluated by GI yesterday Remains on full vent support last night  Objective   Blood pressure (!) 98/58, pulse 80, temperature 98.3 F (36.8 C), temperature source Oral, resp. rate 19, weight 73.4 kg, SpO2 100 %.    Vent Mode: PSV;CPAP FiO2 (%):  [40 %] 40 % Set Rate:  [20 bmp] 20 bmp Vt Set:  [420 mL] 420 mL PEEP:  [5 cmH20] 5 cmH20 Pressure Support:  [10 ULA45-36 cmH20] 10 cmH20 Plateau Pressure:  [14 cmH20] 14 cmH20   Intake/Output Summary (Last 24 hours) at 06/08/2022 0830 Last data filed at 06/08/2022 0600 Gross per 24 hour  Intake 5011.54 ml  Output 3775 ml  Net 1236.54 ml   Filed Weights   06/06/22 0500 06/07/22 0454 06/08/22 0500  Weight: 67 kg 70.3 kg 73.4 kg   Examination: General: Acutely ill middle-aged woman laying in  bed. NAD. HENT: [place]/AT. ET tube in place. Scleral icterus. Conjunctival injection and edema improved. PERRLA. Lungs: Mechanical lung sounds. No increased WOB. Cardiovascular: Mild tachycardia. Regular rhythm. 2+ BLE pitting edema Abdomen: Soft. Mildly distended. Hypoactive bowel sounds Extremities: Well perfused. Warm and dry. Skin: Multiple bruises on the upper extremities, improving Neuro: Off sedation. Awake with eyes open. Tracking but not following any commands. GU: Foley and Flexi-Seal in place  Sodium 157->155->153->143, K+ 3.2, creatinine 0.68 WBC  10.7, Hgb 8.4, platelet 125 CBGs 110s to 150s  UOP of 2.5 L Stool output of 1.5 L  Resolved Hospital Problem list   Ileus  Assessment & Plan:   Severe sepsis Acute metabolic encephalopathy Listeria, MSSA bacteremia Listeria meningitis Continues to be on broad-spectrum antibiotics. Repeat blood culture on 11/8 no growth after 5 days. Remains critically ill however awake with eyes open. Follows commands to take deep breaths but unable to squeeze fingers. She remains afebrile with stable WBC.  -ID following, appreciate recs -Continue Ampicillin, gentamicin and Ancef -Will consider CT head/EEG if mental status does not improve off sedation -Trend CBC/fever curve  -Pending TEE, will discuss with cards concerning timing   Acute hypoxic respiratory failure CAP versus aspiration pneumonia Compressive atelectasis Remains on full vent support overnight. Still fluid overloaded. Weaning on 12/5 this morning.   -Continue low tidal volume ventilation -Continue SBT -Continue aggressive diuresing -Continue antibiotics as above -Trach if unable to extubate in the next few days  Fluid overloaded TTE during this admission unremarkable. UOP of 2.5 L in the last 24 hrs but net +1.6 L. Will continue aggressive diuresing and stop free water.  -Continue IV Lasix to 80 mg Q8H -Monitor BMP/UOP -Strict I&O's/daily weights -Ted Hose  Hypernatremia, resolved Likely multifactorial in the setting of insensible losses, diuretic and GI losses. Free water deficit of 4.6 L.  Sodium improved to 143 today. -Discontinue free water flushes -Trend BMP  Alcoholic cirrhosis Hepatic encephalopathy? Recently diagnosed cirrhosis during last hospitalization.  Concern for possible hepatic encephalopathy and possible SBP on admission. MELD Na score of 17. Child Pugh Class C. FIB-4 score of 7.46 indicates advance fibrosis . Peritoneal labs not consistent with SBP, however, patient received abx prior to tap. Patient  continues to have watery stools 1.5 L of stool output in last 24 hours. LFTs have been stable.  -Continue IV Lasix as above -Continue home spironolactone -Cont thiamine, folate., MVI -Continue lactulose to 10 mg TID, goal 2-3 BM -Outpatient GI follow up  Diarrhea History of recent ileus. Patient continues to have watery stools up to 1.5 L in the last 24 hours. Mild leukocytosis, but low concern for Cdiff.  -Continue to hold bowel regimen -Continue lactulose to 10 mg TID, consider holding or decreasing frequency if stool output does not decrease tmr -Continue Flexi-Seal  Hx SVT Sinus tach Prolonged Qtc, improved Patient found to have SVT on 11/8 requiring amnio bolus and infusion. HR mains stable in the 80s to 90s. -Trend and replete electrolytes -Telemetry, QTc monitoring  Hypokalemia, resolved Hypophosphatemia, resolved. K+ dropped to 3.2 overnight. Mag and Phos within normal limits. -IV Kcl 10 mEq x4 -Continue KCl 40 mEq BID with scheduled IV Lasix -Daily BMP -Trend electrolytes  Protein caloric malnutrition Cortrak placed yesterday. CBGs stable in the 120s to 150s. Tube feed remains at goal. -RD following, appreciate assistance -Continue tube feeds -SSI, CBG monitoring  Best Practice (right click and "Reselect all SmartList Selections" daily)   Diet/type: tubefeeds and NPO DVT prophylaxis: prophylactic heparin  GI prophylaxis: PPI Lines: N/A Foley:  Yes, and it is still needed Code Status:  full code Last date of multidisciplinary goals of care discussion [updated mother at bedside]  Labs   CBC: Recent Labs  Lab 06/02/22 0453 06/03/22 0421 06/04/22 0329 06/05/22 0345 06/06/22 0325 06/07/22 0320 06/08/22 0518  WBC 12.4*   < > 9.6 12.0* 10.4 11.3* 10.7*  NEUTROABS 9.1*  --   --   --   --   --  8.2*  HGB 9.3*   < > 8.8* 9.1* 8.5* 8.6* 8.4*  HCT 28.3*   < > 26.8* 27.5* 26.9* 27.2* 25.5*  MCV 111.4*   < > 112.1* 111.8* 115.9* 117.2* 114.9*  PLT 88*   < > 125*  145* 134* 139* 125*   < > = values in this interval not displayed.    Basic Metabolic Panel: Recent Labs  Lab 06/03/22 0421 06/04/22 0329 06/05/22 0345 06/05/22 1430 06/06/22 0325 06/07/22 0320 06/08/22 0518  NA 150* 148* 157* 157* 155* 153* 143  K 2.8* 4.1 2.8* 3.7 3.6 4.3 3.2*  CL 108 103 110 111 109 111 105  CO2 30 31 35* 36* 34* 31 29  GLUCOSE 160* 214* 148* 135* 165* 129* 160*  BUN 17 19 31* 33* 36* 39* 37*  CREATININE 0.55 0.58 0.71 0.64 0.64 0.74 0.68  CALCIUM 9.6 10.0 10.9* 10.7* 10.9* 10.6* 9.5  MG 2.2 2.1 2.2  --  2.2 2.3  --   PHOS 2.3* 7.3* 4.2  --  3.4 3.9  --    GFR: Estimated Creatinine Clearance: 80.8 mL/min (by C-G formula based on SCr of 0.68 mg/dL). Recent Labs  Lab 06/05/22 0345 06/06/22 0325 06/07/22 0320 06/08/22 0518  WBC 12.0* 10.4 11.3* 10.7*    Liver Function Tests: Recent Labs  Lab 06/04/22 0329 06/05/22 0345 06/06/22 0325 06/07/22 0320 06/08/22 0518  AST 52* 63* 52* 66* 69*  ALT 23 21 17 17 18   ALKPHOS 103 103 102 111 105  BILITOT 3.7* 3.5* 3.2* 3.3* 3.0*  PROT 6.5 6.7 6.5 6.4* 6.1*  ALBUMIN 3.3* 3.2* 3.0* 2.9* 2.7*   No results for input(s): "LIPASE", "AMYLASE" in the last 168 hours. Recent Labs  Lab 06/04/22 0329  AMMONIA 24    ABG    Component Value Date/Time   PHART 7.379 05/30/2022 0453   PCO2ART 34.3 05/30/2022 0453   PO2ART 90 05/30/2022 0453   HCO3 19.9 (L) 05/30/2022 0453   TCO2 21 (L) 05/30/2022 0453   ACIDBASEDEF 4.0 (H) 05/30/2022 0453   O2SAT 96 05/30/2022 0453     Coagulation Profile: No results for input(s): "INR", "PROTIME" in the last 168 hours.   Cardiac Enzymes: No results for input(s): "CKTOTAL", "CKMB", "CKMBINDEX", "TROPONINI" in the last 168 hours.  HbA1C: Hgb A1c MFr Bld  Date/Time Value Ref Range Status  04/25/2022 03:41 AM <4.2 (L) 4.8 - 5.6 % Final    Comment:    (NOTE) **Verified by repeat analysis**         Prediabetes: 5.7 - 6.4         Diabetes: >6.4         Glycemic  control for adults with diabetes: <7.0     CBG: Recent Labs  Lab 06/07/22 1604 06/07/22 1933 06/07/22 2329 06/08/22 0346 06/08/22 0804  GLUCAP 144* 117* 157* 128* 142*    Review of Systems:   As in HPI  Past Medical History:  She,  has no past medical history on file.  Surgical History:   Past Surgical History:  Procedure Laterality Date   KNEE ARTHROSCOPY W/ ACL RECONSTRUCTION Left 1994/5   Dr. Weston Anna   LAPAROSCOPIC APPENDECTOMY N/A 04/11/2013   Procedure: APPENDECTOMY LAPAROSCOPIC;  Surgeon: Stark Klein, MD;  Location: [facility];  Service: General;  Laterality: N/A;   [place]   3 extractions     Social History:   reports that she has never smoked. She does not have any smokeless tobacco history on file. She reports current alcohol use. She reports that she does not use drugs.   Family History:  Her family history is not on file.   Allergies No Known Allergies   Home Medications  Prior to Admission medications   Medication Sig Start Date End Date Taking? Authorizing Provider  acetaminophen (TYLENOL) 325 MG tablet Take 650 mg by mouth every 6 (six) hours as needed for headache or moderate pain.   Yes [provider]  folic acid (FOLVITE) 1 MG tablet Take 1 tablet (1 mg total) by mouth daily. 04/28/22  Yes Hosie Poisson, MD  furosemide (LASIX) 40 MG tablet Take 1 tablet (40 mg total) by mouth daily. 05/12/22 05/12/23 Yes Sheikh, Omair Latif, DO  ibuprofen (ADVIL) 200 MG tablet Take 400 mg by mouth every 6 (six) hours as needed for headache or moderate pain.   Yes [provider]  KLOR-CON M20 20 MEQ tablet Take 20 mEq by mouth daily. 05/14/22  Yes [provider]  lactulose (CHRONULAC) 10 GM/15ML solution Take 30 mLs (20 g total) by mouth 3 (three) times daily. 04/27/22  Yes Hosie Poisson, MD  Multiple Vitamin (MULTIVITAMIN WITH MINERALS) TABS tablet Take 1 tablet by mouth daily. 04/28/22  Yes Hosie Poisson, MD   pantoprazole (PROTONIX) 40 MG tablet Take 1 tablet (40 mg total) by mouth daily. 04/27/22 04/27/23 Yes Hosie Poisson, MD  polyethylene glycol (MIRALAX / GLYCOLAX) 17 g packet Take 17 g by mouth daily as needed for mild constipation. 05/12/22  Yes Sheikh, Omair Latif, DO  prednisoLONE 5 MG TABS tablet Take 40 mg by mouth daily. 05/14/22  Yes [provider]  predniSONE (DELTASONE) 20 MG tablet Take 40 mg by mouth daily. 04/29/22  Yes [provider]  simethicone (MYLICON) 80 MG chewable tablet Chew 1 tablet (80 mg total) by mouth every 6 (six) hours as needed for flatulence. 04/27/22  Yes Hosie Poisson, MD  spironolactone (ALDACTONE) 50 MG tablet Take 1 tablet (50 mg total) by mouth daily. 05/13/22  Yes Sheikh, Omair Latif, DO  thiamine (VITAMIN B-1) 100 MG tablet Take 1 tablet (100 mg total) by mouth daily. 04/28/22  Yes Hosie Poisson, MD     Critical care time: 100

## 2022-06-08 NOTE — Progress Notes (Signed)
PT Cancellation Note  Patient Details Name: Deanna Mcmillan MRN: [id number] DOB: 13-Apr-1971   Cancelled Treatment:    Reason Eval/Treat Not Completed: Patient not medically ready (Pt intubated, sedated and not appropriate for therapy at this time with lack of command following)   Kasir Hallenbeck B Lynx Goodrich 06/08/2022, 7:53 AM [place] Office: [phone]

## 2022-06-08 NOTE — Progress Notes (Signed)
Pharmacy Antibiotic Note  Deanna Mcmillan is a 51 y.o. female admitted on 06/05/2022 with bacteremia/possible concern for meningitis/sepsis/SBP. Blood cultures from 11/7 showing MSSA, MSSE and listeria in 1/2 sets. Also with listeria meningitis. Repeat blood cultures 11/8 no growth at 5 days. Pharmacy has been consulted for gentamicin dosing. SCr- 0.68 with CrCl ~ 81 ml/min.   Patient has been receiving gentamicin at 40 mg Q12 hours after previous supratherapeutic levels. Gent peak 11/16 2040 @ 6.3. Gent trough 11/17 0518 1.1. Gentamicin levels remain supratherapeutic requiring dose adjustment. WBC elevated at 10.7 and afebrile in past 24 hours. Continue on same antibiotic regimen for now.   Plan:  Ampicillin 2 gm IV every 4 hours  Cefazolin 2 gm IV every 8 hours Decrease to gentamicin 20 mg Q 12 hours (Predicted peak 3.8 (Goal 3-4) and trough 0.4 (Goal < 1) )   Monitor renal function /cultures/clinical status  Obtain gentamicin peaks and troughs as clinically appropriate  Narrow abx as able and f/u with appropriate duration      Weight: 73.4 kg (161 lb 13.1 oz)  Temp (24hrs), Avg:99.2 F (37.3 C), Min:97.9 F (36.6 C), Max:99.9 F (37.7 C)  Recent Labs  Lab 06/04/22 0329 06/04/22 2011 06/05/22 0345 06/05/22 0611 06/05/22 1430 06/06/22 0325 06/07/22 0320 06/07/22 2040 06/08/22 0518  WBC 9.6  --  12.0*  --   --  10.4 11.3*  --  10.7*  CREATININE 0.58  --  0.71  --  0.64 0.64 0.74  --  0.68  GENTTROUGH  --   --   --  1.6  --   --   --   --  1.1  GENTPEAK  --  5.2  --   --   --   --   --  6.3  --      Estimated Creatinine Clearance: 80.8 mL/min (by C-G formula based on SCr of 0.68 mg/dL).    No Known Allergies  Antimicrobials this admission: vancomycin 11/7 >> 11/11 Cefepime 11/7 >> 11/8 Ceftriaxone 11/8 >11/11 Gent 11/8> Ampicillin 11/8> Cefazolin 11/11>  Dose adjustments this admission:  11/10: Gent peak : 6.9, Gent trough: 4.3 >> adjusted to 80 mg q12h 11/14: Gent  peak: 5.2, Gent trough: 1.6 >> adjusted to 40 mg q12h  11/17: Gent peak: 6.3, Gent trough: 1.1 >> adjusted to 20 mg q12h  Thank you for allowing pharmacy to be a part of this patient's care.  Jeneen Rinks, Pharm.D PGY1 Pharmacy Resident 06/08/2022 7:58 AM

## 2022-06-09 DIAGNOSIS — J9601 Acute respiratory failure with hypoxia: Secondary | ICD-10-CM | POA: Diagnosis not present

## 2022-06-09 LAB — PROTIME-INR
INR: 1.2 (ref 0.8–1.2)
Prothrombin Time: 15.5 seconds — ABNORMAL HIGH (ref 11.4–15.2)

## 2022-06-09 LAB — GLUCOSE, CAPILLARY
Glucose-Capillary: 130 mg/dL — ABNORMAL HIGH (ref 70–99)
Glucose-Capillary: 148 mg/dL — ABNORMAL HIGH (ref 70–99)
Glucose-Capillary: 151 mg/dL — ABNORMAL HIGH (ref 70–99)
Glucose-Capillary: 139 mg/dL — ABNORMAL HIGH (ref 70–99)
Glucose-Capillary: 156 mg/dL — ABNORMAL HIGH (ref 70–99)
Glucose-Capillary: 142 mg/dL — ABNORMAL HIGH (ref 70–99)

## 2022-06-09 MED ORDER — ZINC SULFATE 220 (50 ZN) MG PO CAPS
220.0000 mg | ORAL_CAPSULE | Freq: Every day | ORAL | Status: DC
  Administered 2022-06-09 – 2022-06-15 (×7): 220 mg
  Filled 2022-06-09 (×7): qty 1

## 2022-06-09 MED ORDER — POTASSIUM CHLORIDE 20 MEQ PO PACK
60.0000 meq | PACK | Freq: Once | ORAL | Status: AC
  Administered 2022-06-09: 60 meq
  Filled 2022-06-09: qty 3

## 2022-06-09 MED ORDER — MAGNESIUM SULFATE 2 GM/50ML IV SOLN
2.0000 g | Freq: Once | INTRAVENOUS | Status: AC
  Administered 2022-06-09: 2 g via INTRAVENOUS
  Filled 2022-06-09: qty 50

## 2022-06-09 MED ORDER — K PHOS MONO-SOD PHOS DI & MONO 155-852-130 MG PO TABS
250.0000 mg | ORAL_TABLET | Freq: Every day | ORAL | Status: DC
  Filled 2022-06-09: qty 1

## 2022-06-09 MED ORDER — FUROSEMIDE 10 MG/ML IJ SOLN
80.0000 mg | Freq: Four times a day (QID) | INTRAMUSCULAR | Status: AC
  Administered 2022-06-09 – 2022-06-10 (×3): 80 mg via INTRAVENOUS
  Filled 2022-06-09 (×2): qty 8

## 2022-06-09 MED ORDER — ZINC SULFATE 220 (50 ZN) MG PO CAPS: 220.0000 mg | ORAL_CAPSULE | Freq: Every day | ORAL | Status: DC

## 2022-06-09 MED ORDER — K PHOS MONO-SOD PHOS DI & MONO 155-852-130 MG PO TABS
250.0000 mg | ORAL_TABLET | Freq: Every day | ORAL | Status: DC
  Administered 2022-06-09 – 2022-06-15 (×7): 250 mg
  Filled 2022-06-09 (×7): qty 1

## 2022-06-09 MED ORDER — POTASSIUM CHLORIDE 20 MEQ PO PACK: 60.0000 meq | PACK | Freq: Two times a day (BID) | ORAL | Status: DC

## 2022-06-09 MED ORDER — METOLAZONE 2.5 MG PO TABS
5.0000 mg | ORAL_TABLET | Freq: Once | ORAL | Status: AC
  Administered 2022-06-09: 5 mg
  Filled 2022-06-09: qty 2

## 2022-06-09 MED ORDER — FUROSEMIDE 10 MG/ML IJ SOLN: 40.0000 mg | Freq: Four times a day (QID) | INTRAMUSCULAR | Status: DC

## 2022-06-09 NOTE — Evaluation (Addendum)
Physical Therapy Evaluation Patient Details Name: Deanna Mcmillan MRN: [id number] DOB: 15-Feb-1971 Today's Date: 06/09/2022  History of Present Illness  51 yo female admitted 11/7 after unable to arouse at home. Intubated 11/7. 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 PMhx: ETOH hepatitis  Clinical Impression  Pt with eyes open and able to visually track when name called. Pt with only limited response of squeezing left hand without further movement noted in trunk, neck or extremities. Mom present to provide PLOF and home setup. Pt was independent, living alone and working PTA. Mom is available to provide assist at D/C as she works from home and provided setup for her house. Pt with significant muscle weakness without command following and currently requires total assist for all mobility. Pt will benefit from acute therapy to maximize mobility, function, safety and strength to decrease burden of care. Mom present and educated for PROM all extremities. Pt would benefit from PRAFOs  PRVC 40% with SPO2 96% HR 82        Recommendations for follow up therapy are one component of a multi-disciplinary discharge planning process, led by the attending physician.  Recommendations may be updated based on patient status, additional functional criteria and insurance authorization.  Follow Up Recommendations PT at [facility]term acute care hospital      Assistance Recommended at Discharge Frequent or constant Supervision/Assistance  Patient can return home with the following  Two people to help with walking and/or transfers;Two people to help with bathing/dressing/bathroom;Direct supervision/assist for financial management;Direct supervision/assist for medications management;Assistance with feeding;Assistance with cooking/housework;Assist for transportation    Equipment Recommendations Wheelchair (measurements PT);Hospital bed;Wheelchair cushion (measurements PT);Other (comment) (hoyer)  Recommendations for Other  Services       Functional Status Assessment Patient has had a recent decline in their functional status and demonstrates the ability to make significant improvements in function in a reasonable and predictable amount of time.     Precautions / Restrictions Precautions Precautions: Fall;Other (comment) Precaution Comments: vent, ETT, cortrak      Mobility  Bed Mobility Overal bed mobility: Needs Assistance Bed Mobility: Supine to Sit     Supine to sit: Total assist     General bed mobility comments: total assist with use of foot egress function to transition to sitting EOB. Total assist for anterior translation off of surface, pt with tendency for right lean in sitting    Transfers                   General transfer comment: unable    Ambulation/Gait                  Stairs            Wheelchair Mobility    Modified Rankin (Stroke Patients Only)       Balance                                             Pertinent Vitals/Pain Pain Assessment Pain Assessment: CPOT Facial Expression: Relaxed, neutral Body Movements: Absence of movements Muscle Tension: Relaxed Compliance with ventilator (intubated pts.): Tolerating ventilator or movement Vocalization (extubated pts.): N/A CPOT Total: 0    Home Living Family/patient expects to be discharged to:: Private residence Living Arrangements: Alone Available Help at Discharge: Family;Available 24 hours/day Type of Home: House Home Access: Stairs to enter Entrance Stairs-Rails: None Entrance Stairs-Number of Steps: 2  Alternate Level Stairs-Number of Steps: 14 Home Layout: Able to live on main level with bedroom/bathroom;Two level;1/2 bath on main level;Bed/bath upstairs Home Equipment: Rolling Walker (2 wheels);Cane - single point;Shower seat      Prior Function Prior Level of Function : Independent/Modified Independent                     Hand Dominance         Extremity/Trunk Assessment   Upper Extremity Assessment Upper Extremity Assessment: RUE deficits/detail;LUE deficits/detail RUE Deficits / Details: PROM WFL for shoulder, elbow and wrist with edema and UE elevated end of session. very minimal squeeze 1/5 to command LUE Deficits / Details: PROM WFL for shoulder, elbow and wrist with edema and UE elevated end of session    Lower Extremity Assessment Lower Extremity Assessment: RLE deficits/detail;LLE deficits/detail RLE Deficits / Details: PROM WFL with edema bil feet and tendency for plantarflexion. No AROM or withdrawal LLE Deficits / Details: PROM WFL with edema bil feet and tendency for plantarflexion. No AROM or withdrawal       Communication   Communication: Other (comment) (intubated)  Cognition Arousal/Alertness: Awake/alert Behavior During Therapy: Flat affect Overall Cognitive Status: Impaired/Different from baseline                                 General Comments: pt with eyes open, tracking when name called, able to perform slight squeeze on command 2/4 trials with left hand only. Pt closing eyes when back scratched and slight eyebrow movement with noxious stimuli. No other active attempts at command following or communication        General Comments      Exercises General Exercises - Upper Extremity Shoulder Flexion: PROM, Both, Seated, 10 reps Elbow Flexion: PROM, Both, 10 reps, Seated Elbow Extension: PROM, Both, 10 reps, Seated General Exercises - Lower Extremity Ankle Circles/Pumps: PROM, Both, 10 reps, Seated Long Arc Quad: PROM, Both, 10 reps, Seated Hip Flexion/Marching: PROM, Both, 10 reps, Seated   Assessment/Plan    PT Assessment Patient needs continued PT services  PT Problem List Decreased strength;Decreased mobility;Decreased coordination;Decreased activity tolerance;Decreased cognition;Cardiopulmonary status limiting activity;Decreased balance       PT Treatment Interventions  Therapeutic activities;Functional mobility training;Neuromuscular re-education;Balance training;Therapeutic exercise;Patient/family education    PT Goals (Current goals can be found in the Care Plan section)  Acute Rehab PT Goals Patient Stated Goal: return to independence, walking, working PT Goal Formulation: With family Time For Goal Achievement: 06/22/22 Potential to Achieve Goals: Fair    Frequency Min 2X/week     Co-evaluation               AM-PAC PT "6 Clicks" Mobility  Outcome Measure Help needed turning from your back to your side while in a flat bed without using bedrails?: Total Help needed moving from lying on your back to sitting on the side of a flat bed without using bedrails?: Total Help needed moving to and from a bed to a chair (including a wheelchair)?: Total Help needed standing up from a chair using your arms (e.g., wheelchair or bedside chair)?: Total Help needed to walk in hospital room?: Total Help needed climbing 3-5 steps with a railing? : Total 6 Click Score: 6    End of Session   Activity Tolerance: Patient tolerated treatment well Patient left: in bed;with call bell/phone within reach;with family/visitor present Nurse Communication: Mobility status;Need for lift equipment PT  Visit Diagnosis: Other abnormalities of gait and mobility (R26.89);Muscle weakness (generalized) (M62.81);Other symptoms and signs involving the nervous system (R29.898)    Time: 6147-0929 PT Time Calculation (min) (ACUTE ONLY): 19 min   Charges:   PT Evaluation $PT Eval High Complexity: 1 High          Radha Coggins P, PT Acute Rehabilitation Services Office: [phone]   Sandy Salaam Yen Wandell 06/09/2022, 10:59 AM

## 2022-06-09 NOTE — Progress Notes (Signed)
NAME:  Deanna Mcmillan, MRN:  [id number], DOB:  05-21-1971, LOS: 24 ADMISSION DATE:  06/09/2022, CONSULTATION DATE:  11/7 REFERRING MD:  Lendon Colonel, EDP CHIEF COMPLAINT:  AMS   History of Present Illness:  51 yo woman with hx of recent diagnosis of alcoholic hepatitis (dx 32/35), here with ams, fever.   History from her mother Deanna Mcmillan.  Patient was in her normal state of health, until suddenly last night she developed a headache.  She went to sleep to rest.  Today she slept throughout the day, this evening her mother went to wake her and was unable to wake her up, so she called 911.     She has been doing well since her recent hospital discharge, taking all the meds as prescribed, no new meds.  Planned follow up with GI on 11/14.  LE edema had been improving.  No cough, sob no focal symptoms.  No known sick contacts.     In ED, febrile, tachycardic, lethargic, desaturating to 70s.   Intubated by ED physician.    Had been admitted earlier in October and started on course of prenisone for alc hepatitis.    Recently seen at [facility] 10/19: LE Edema. Lasix not helpful at home.  Admitted for diuresis.  Jaundice and abd distension also noted.  Pleural effusion.  GI saw: non enough ascites for tap  Started lasix and spironolactone.  28 day course of prednisolone, miralax, lactulose, miralax. Midodrine TID    In ED got cefepime, LR 1L, flagyl ordered, intubated, started on Propofol, vanc ordered   Pertinent  Medical History  ETOH Cirrhosis BLE edema recently worsening  S/p lap appy 2014    Echo: recent normal 57/3220   Home meds: folic ascid, acetaminohpen, lasix 40bid, lactulose 30 TID protonix, prednisone (04/29/22), prednisolone 04/28/22 three week course, thiamine, simethicone,    Per her mother she had been drinking at least one bottle of wine daily, she isnt clear exactly how much she was drinking but she did also drink [place].  She notes she had wanted to stop recently but been unable to.   She thinks she was depressed after experiencing perimenopause.  No tobacco.   Significant Hospital Events: Including procedures, antibiotic start and stop dates in addition to other pertinent events   11/7: Intubated in ED and admitted to ICU 11/7: [place] neg, CT A/P cirrhosis with portal hypertension, splenomegaly, moderate volume ascites, moderate bilateral pleural effusion 11/9 KUB: No ileus 11/10: LP confirms listeria meningitis 11/11 KUB: ileus 11/12 ileus resolved with aggressive bowel regimen 11/15 KUB: No ileus. 11/18 more awake, following commands   Interim History / Subjective:   Remains critically ill intubated on mechanical life support much more alert this morning.  Able to set areas itself on my related exam.  Objective   Blood pressure (!) 99/58, pulse 83, temperature 99.6 F (37.6 C), temperature source Axillary, resp. rate 18, weight 72.7 kg, SpO2 97 %.    Vent Mode: PSV;CPAP FiO2 (%):  [40 %] 40 % Set Rate:  [20 bmp] 20 bmp Vt Set:  [420 mL] 420 mL PEEP:  [5 cmH20] 5 cmH20 Pressure Support:  [10 cmH20] 10 cmH20 Plateau Pressure:  [9 cmH20-15 cmH20] 14 cmH20   Intake/Output Summary (Last 24 hours) at 06/09/2022 1049 Last data filed at 06/09/2022 0600 Gross per 24 hour  Intake 2602.95 ml  Output 2700 ml  Net -97.05 ml   Filed Weights   06/07/22 0454 06/08/22 0500 06/09/22 0500  Weight: 70.3 kg  73.4 kg 72.7 kg   Examination: General: Chronically ill-appearing female.  Critically ill.  Intubated on mechanical life support HENT: NCAT, scleral icterus, endotracheal tube in place, tracking appropriately Lungs: Bilateral mechanically ventilated breath sounds Cardiovascular: Tachycardic, regular rhythm, S1-S2 Abdomen: Soft, nontender nondistended Extremities: Well-perfused, dependent edema, scattered bruising Skin: Still limited as, multiple bruising Neuro: Alert opens eyes tracks following commands GU: Pure wick and Flexi-Seal in place   Resolved Hospital  Problem list   Ileus  Assessment & Plan:   Severe sepsis, resolving  Acute metabolic encephalopathy, improving  Listeria, MSSA bacteremia Listeria meningitis Plan: Remains on broad-spectrum antibiotics White count improving Appreciate infectious disease input. Continue ampicillin gentamicin and Ancef.   Acute hypoxic respiratory failure CAP versus aspiration pneumonia Compressive atelectasis Remains on full vent support overnight. Still fluid overloaded. Weaning on 12/5 this morning.   Plan: Continue low tidal volume elation strategy Continue SAT SBT as tolerated Continue diuresis 40 mg Lasix every 6 hours plus metolazone today. Repeat electrolytes this afternoon  Fluid overloaded, +CFB  TTE during this admission unremarkable. UOP of 2.5 L in the last 24 hrs but net +1.6 L. Will continue aggressive diuresing and stop free water.  Plan: Continue IV Lasix 80 mg every 6 hours Metolazone 5 mg x 1 Recheck electrolytes later today  Hypernatremia, resolved Likely multifactorial in the setting of insensible losses, diuretic and GI losses. Free water deficit of 4.6 L.  Sodium improved to 143 today. Plan: Free water stopped Sodium better. We will follow with diuresis.  Alcoholic cirrhosis Hepatic encephalopathy Recently diagnosed cirrhosis during last hospitalization.  Concern for possible hepatic encephalopathy and possible SBP on admission. MELD Na score of 17. Child Pugh Class C. FIB-4 score of 7.46 indicates advance fibrosis . Peritoneal labs not consistent with SBP, however, patient received abx prior to tap. Patient continues to have watery stools 1.5 L of stool output in last 24 hours. LFTs have been stable.  Plan: Continue spironolactone and Lasix Thiamine folate multivitamin Lactulose 10 mg 3 times daily  Hx SVT Sinus tach Prolonged Qtc, improved Patient found to have SVT on 11/8 requiring amnio bolus and infusion. HR mains stable in the 80s to 90s. Plan: Continue  electrolyte management Remains on telemetry  Hypokalemia,  Hypophosphatemia, resolved. Plan: Continue to replete to maintain goal potassium greater than  Protein caloric malnutrition Cortrak placed yesterday. CBGs stable in the 120s to 150s. Tube feed remains at goal. P:  Continue Tfs   Best Practice (right click and "Reselect all SmartList Selections" daily)   Diet/type: tubefeeds and NPO DVT prophylaxis: prophylactic heparin  GI prophylaxis: PPI Lines: N/A Foley:  Yes, and it is still needed Code Status:  full code Last date of multidisciplinary goals of care discussion [updated mother at bedside]  Labs   CBC: Recent Labs  Lab 06/04/22 0329 06/05/22 0345 06/06/22 0325 06/07/22 0320 06/08/22 0518  WBC 9.6 12.0* 10.4 11.3* 10.7*  NEUTROABS  --   --   --   --  8.2*  HGB 8.8* 9.1* 8.5* 8.6* 8.4*  HCT 26.8* 27.5* 26.9* 27.2* 25.5*  MCV 112.1* 111.8* 115.9* 117.2* 114.9*  PLT 125* 145* 134* 139* 125*    Basic Metabolic Panel: Recent Labs  Lab 06/03/22 0421 06/04/22 0329 06/05/22 0345 06/05/22 1430 06/06/22 0325 06/07/22 0320 06/08/22 0518  NA 150* 148* 157* 157* 155* 153* 143  K 2.8* 4.1 2.8* 3.7 3.6 4.3 3.2*  CL 108 103 110 111 109 111 105  CO2 30 31 35* 36*  34* 31 29  GLUCOSE 160* 214* 148* 135* 165* 129* 160*  BUN 17 19 31* 33* 36* 39* 37*  CREATININE 0.55 0.58 0.71 0.64 0.64 0.74 0.68  CALCIUM 9.6 10.0 10.9* 10.7* 10.9* 10.6* 9.5  MG 2.2 2.1 2.2  --  2.2 2.3  --   PHOS 2.3* 7.3* 4.2  --  3.4 3.9  --    GFR: Estimated Creatinine Clearance: 80.4 mL/min (by C-G formula based on SCr of 0.68 mg/dL). Recent Labs  Lab 06/05/22 0345 06/06/22 0325 06/07/22 0320 06/08/22 0518  WBC 12.0* 10.4 11.3* 10.7*    Liver Function Tests: Recent Labs  Lab 06/04/22 0329 06/05/22 0345 06/06/22 0325 06/07/22 0320 06/08/22 0518  AST 52* 63* 52* 66* 69*  ALT 23 21 17 17 18   ALKPHOS 103 103 102 111 105  BILITOT 3.7* 3.5* 3.2* 3.3* 3.0*  PROT 6.5 6.7 6.5 6.4*  6.1*  ALBUMIN 3.3* 3.2* 3.0* 2.9* 2.7*   No results for input(s): "LIPASE", "AMYLASE" in the last 168 hours. Recent Labs  Lab 06/04/22 0329  AMMONIA 24    ABG    Component Value Date/Time   PHART 7.379 05/30/2022 0453   PCO2ART 34.3 05/30/2022 0453   PO2ART 90 05/30/2022 0453   HCO3 19.9 (L) 05/30/2022 0453   TCO2 21 (L) 05/30/2022 0453   ACIDBASEDEF 4.0 (H) 05/30/2022 0453   O2SAT 96 05/30/2022 0453     Coagulation Profile: No results for input(s): "INR", "PROTIME" in the last 168 hours.   Cardiac Enzymes: No results for input(s): "CKTOTAL", "CKMB", "CKMBINDEX", "TROPONINI" in the last 168 hours.  HbA1C: Hgb A1c MFr Bld  Date/Time Value Ref Range Status  04/25/2022 03:41 AM <4.2 (L) 4.8 - 5.6 % Final    Comment:    (NOTE) **Verified by repeat analysis**         Prediabetes: 5.7 - 6.4         Diabetes: >6.4         Glycemic control for adults with diabetes: <7.0     CBG: Recent Labs  Lab 06/08/22 1540 06/08/22 1929 06/08/22 2317 06/09/22 0321 06/09/22 0751  GLUCAP 151* 118* 134* 130* 148*    Review of Systems:   As in HPI  Past Medical History:  She,  has no past medical history on file.   Surgical History:   Past Surgical History:  Procedure Laterality Date   KNEE ARTHROSCOPY W/ ACL RECONSTRUCTION Left 1994/5   Dr. Weston Anna   LAPAROSCOPIC APPENDECTOMY N/A 04/11/2013   Procedure: APPENDECTOMY LAPAROSCOPIC;  Surgeon: Stark Klein, MD;  Location: [facility];  Service: General;  Laterality: N/A;   [place]   3 extractions     Social History:   reports that she has never smoked. She does not have any smokeless tobacco history on file. She reports current alcohol use. She reports that she does not use drugs.   Family History:  Her family history is not on file.   Allergies No Known Allergies   Home Medications  Prior to Admission medications   Medication Sig Start Date End Date Taking? Authorizing Provider   acetaminophen (TYLENOL) 325 MG tablet Take 650 mg by mouth every 6 (six) hours as needed for headache or moderate pain.   Yes [provider]  folic acid (FOLVITE) 1 MG tablet Take 1 tablet (1 mg total) by mouth daily. 04/28/22  Yes Hosie Poisson, MD  furosemide (LASIX) 40 MG tablet Take 1 tablet (40 mg total) by  mouth daily. 05/12/22 05/12/23 Yes Sheikh, Omair Latif, DO  ibuprofen (ADVIL) 200 MG tablet Take 400 mg by mouth every 6 (six) hours as needed for headache or moderate pain.   Yes [provider]  KLOR-CON M20 20 MEQ tablet Take 20 mEq by mouth daily. 05/14/22  Yes [provider]  lactulose (CHRONULAC) 10 GM/15ML solution Take 30 mLs (20 g total) by mouth 3 (three) times daily. 04/27/22  Yes Hosie Poisson, MD  Multiple Vitamin (MULTIVITAMIN WITH MINERALS) TABS tablet Take 1 tablet by mouth daily. 04/28/22  Yes Hosie Poisson, MD  pantoprazole (PROTONIX) 40 MG tablet Take 1 tablet (40 mg total) by mouth daily. 04/27/22 04/27/23 Yes Hosie Poisson, MD  polyethylene glycol (MIRALAX / GLYCOLAX) 17 g packet Take 17 g by mouth daily as needed for mild constipation. 05/12/22  Yes Sheikh, Omair Latif, DO  prednisoLONE 5 MG TABS tablet Take 40 mg by mouth daily. 05/14/22  Yes [provider]  predniSONE (DELTASONE) 20 MG tablet Take 40 mg by mouth daily. 04/29/22  Yes [provider]  simethicone (MYLICON) 80 MG chewable tablet Chew 1 tablet (80 mg total) by mouth every 6 (six) hours as needed for flatulence. 04/27/22  Yes Hosie Poisson, MD  spironolactone (ALDACTONE) 50 MG tablet Take 1 tablet (50 mg total) by mouth daily. 05/13/22  Yes Sheikh, Omair Latif, DO  thiamine (VITAMIN B-1) 100 MG tablet Take 1 tablet (100 mg total) by mouth daily. 04/28/22  Yes Hosie Poisson, MD     This patient is critically ill with multiple organ system failure; which, requires frequent high complexity decision making, assessment, support, evaluation, and titration of therapies.  This was completed through the application of advanced monitoring technologies and extensive interpretation of multiple databases. During this encounter critical care time was devoted to patient care services described in this note for 34 minutes.   Garner Nash, DO [facility] 06/09/2022 10:51 AM

## 2022-06-10 DIAGNOSIS — Z9911 Dependence on respirator [ventilator] status: Secondary | ICD-10-CM

## 2022-06-10 DIAGNOSIS — J9601 Acute respiratory failure with hypoxia: Secondary | ICD-10-CM | POA: Diagnosis not present

## 2022-06-10 LAB — BASIC METABOLIC PANEL
Anion gap: 13 (ref 5–15)
BUN: 31 mg/dL — ABNORMAL HIGH (ref 6–20)
CO2: 27 mmol/L (ref 22–32)
Calcium: 9.9 mg/dL (ref 8.9–10.3)
Chloride: 102 mmol/L (ref 98–111)
Creatinine, Ser: 0.58 mg/dL (ref 0.44–1.00)
GFR, Estimated: >60 mL/min (ref >60)
Glucose, Bld: 140 mg/dL — ABNORMAL HIGH (ref 70–99)
Potassium: 4.1 mmol/L (ref 3.5–5.1)
Sodium: 142 mmol/L (ref 135–145)

## 2022-06-10 LAB — GLUCOSE, CAPILLARY
Glucose-Capillary: 136 mg/dL — ABNORMAL HIGH (ref 70–99)
Glucose-Capillary: 140 mg/dL — ABNORMAL HIGH (ref 70–99)
Glucose-Capillary: 131 mg/dL — ABNORMAL HIGH (ref 70–99)
Glucose-Capillary: 160 mg/dL — ABNORMAL HIGH (ref 70–99)
Glucose-Capillary: 161 mg/dL — ABNORMAL HIGH (ref 70–99)
Glucose-Capillary: 123 mg/dL — ABNORMAL HIGH (ref 70–99)
Glucose-Capillary: 147 mg/dL — ABNORMAL HIGH (ref 70–99)

## 2022-06-10 LAB — CBC
HCT: 27 % — ABNORMAL LOW (ref 36.0–46.0)
Hemoglobin: 8.8 g/dL — ABNORMAL LOW (ref 12.0–15.0)
MCH: 38.1 pg — ABNORMAL HIGH (ref 26.0–34.0)
MCHC: 32.6 g/dL (ref 30.0–36.0)
MCV: 116.9 fL — ABNORMAL HIGH (ref 80.0–100.0)
Platelets: 155 10*3/uL (ref 150–400)
RBC: 2.31 MIL/uL — ABNORMAL LOW (ref 3.87–5.11)
RDW: 18.1 % — ABNORMAL HIGH (ref 11.5–15.5)
WBC: 10.3 10*3/uL (ref 4.0–10.5)
nRBC: 0.4 % — ABNORMAL HIGH (ref 0.0–0.2)

## 2022-06-10 LAB — MAGNESIUM: Magnesium: 2.5 mg/dL — ABNORMAL HIGH (ref 1.7–2.4)

## 2022-06-10 LAB — PHOSPHORUS: Phosphorus: 3.1 mg/dL (ref 2.5–4.6)

## 2022-06-10 MED ORDER — FUROSEMIDE 10 MG/ML IJ SOLN
INTRAMUSCULAR | Status: AC
  Filled 2022-06-10: qty 2

## 2022-06-10 MED ORDER — FUROSEMIDE 10 MG/ML IJ SOLN
80.0000 mg | Freq: Four times a day (QID) | INTRAMUSCULAR | Status: AC
  Administered 2022-06-10 – 2022-06-11 (×4): 80 mg via INTRAVENOUS
  Filled 2022-06-10 (×4): qty 8

## 2022-06-10 MED ORDER — METOLAZONE 2.5 MG PO TABS
5.0000 mg | ORAL_TABLET | Freq: Once | ORAL | Status: AC
  Administered 2022-06-10: 5 mg
  Filled 2022-06-10: qty 2

## 2022-06-10 NOTE — Progress Notes (Signed)
NAME:  Deanna Mcmillan, MRN:  [id number], DOB:  12/09/1970, LOS: 25 ADMISSION DATE:  05/26/2022, CONSULTATION DATE:  11/7 REFERRING MD:  Lendon Colonel, EDP CHIEF COMPLAINT:  AMS   History of Present Illness:  51 yo woman with hx of recent diagnosis of alcoholic hepatitis (dx 37/62), here with ams, fever.   History from [name].  Patient was in her normal state of health, until suddenly last night she developed a headache.  She went to sleep to rest.  Today she slept throughout the day, this evening her mother went to wake her and was unable to wake her up, so she called 911.     She has been doing well since her recent hospital discharge, taking all the meds as prescribed, no new meds.  Planned follow up with GI on 11/14.  LE edema had been improving.  No cough, sob no focal symptoms.  No known sick contacts.     In ED, febrile, tachycardic, lethargic, desaturating to 70s.   Intubated by ED physician.    Had been admitted earlier in October and started on course of prenisone for alc hepatitis.    Recently seen at [facility] 10/19: LE Edema. Lasix not helpful at home.  Admitted for diuresis.  Jaundice and abd distension also noted.  Pleural effusion.  GI saw: non enough ascites for tap  Started lasix and spironolactone.  28 day course of prednisolone, miralax, lactulose, miralax. Midodrine TID    In ED got cefepime, LR 1L, flagyl ordered, intubated, started on Propofol, vanc ordered   Pertinent  Medical History  ETOH Cirrhosis BLE edema recently worsening  S/p lap appy 2014    Echo: recent normal 83/1517   Home meds: folic ascid, acetaminohpen, lasix 40bid, lactulose 30 TID protonix, prednisone (04/29/22), prednisolone 04/28/22 three week course, thiamine, simethicone,    Per her mother she had been drinking at least one bottle of wine daily, she isnt clear exactly how much she was drinking but she did also drink [place].  She notes she had wanted to stop recently but been unable to.   She thinks she was depressed after experiencing perimenopause.  No tobacco.   Significant Hospital Events: Including procedures, antibiotic start and stop dates in addition to other pertinent events   11/7: Intubated in ED and admitted to ICU 11/7: [place] neg, CT A/P cirrhosis with portal hypertension, splenomegaly, moderate volume ascites, moderate bilateral pleural effusion 11/9 KUB: No ileus 11/10: LP confirms listeria meningitis 11/11 KUB: ileus 11/12 ileus resolved with aggressive bowel regimen 11/15 KUB: No ileus. 11/18 more awake, following commands   Interim History / Subjective:  No acute events overnight. Tolerated vent weaning yesterday on PS 12 for about 8 hrs.  Afebrile overnight.   Objective   Blood pressure (!) 106/55, pulse 90, temperature 98.9 F (37.2 C), temperature source Oral, resp. rate 16, weight 75.1 kg, SpO2 96 %.    Vent Mode: PRVC FiO2 (%):  [40 %] 40 % Set Rate:  [20 bmp] 20 bmp Vt Set:  [420 mL] 420 mL PEEP:  [5 cmH20] 5 cmH20 Pressure Support:  [10 cmH20] 10 cmH20 Plateau Pressure:  [13 cmH20-15 cmH20] 15 cmH20   Intake/Output Summary (Last 24 hours) at 06/10/2022 1024 Last data filed at 06/10/2022 0900 Gross per 24 hour  Intake 3039.91 ml  Output 3600 ml  Net -560.09 ml    Filed Weights   06/08/22 0500 06/09/22 0500 06/10/22 0424  Weight: 73.4 kg 72.7 kg 75.1 kg  Examination: General: critically ill appearing woman lying in bed in NAD HENT: [place]/AT, eyes anicteric, ETT in place.  Lungs: rhonchi cleared with suctioning Cardiovascular: S1S2, RRR Abdomen: soft, NT Extremities: bruising on arms, +edema, compression stockings Skin: bruising, no diffuse rashes Neuro: awake, not tracking, +cough   BUN 31 Cr 0.58 WBC 10.3 H/H 8.8/27 Platelets 155  Resolved Hospital Problem list   Ileus Hypokalemia Hypophosphatemia hypernatremia  Assessment & Plan:   Severe sepsis, resolving  Acute metabolic encephalopathy, improving  Listeria &  MSSA bacteremia Listeria meningitis -con't antibiotics- ampicillin, gent, cefazolin  -Appreciate ID's management -Waiting on TEE to help with antibiotic duration per ID> does not appear cardiology has been consulted previously for this. Will consult today since we are hopefully nearing an extubation trial in the next few days.   Acute hypoxic respiratory failure CAP versus aspiration pneumonia Compressive atelectasis -LTVV -VAP prevention protocol -PAD protocol for sedation; goal RASS 0 to -1 -con't antibiotics -Mobility as able -con't aggressive diuresis  Fluid overloaded -con't diuresis + empiric potassium repletion -strict I/O  Hypernatremia, resolved -monitor off free water  Alcoholic cirrhosis Hepatic encephalopathy Recently diagnosed cirrhosis during last hospitalization.  Concern for possible hepatic encephalopathy and possible SBP on admission. MELD Na score of 17. Child Pugh Class C. FIB-4 score of 7.46 indicates advance fibrosis . Peritoneal labs not consistent with SBP, however, patient received abx prior to tap. Patient continues to have watery stools 1.5 L of stool output in last 24 hours. LFTs have been stable.  Plan: -lasix + spironolactone -thiamine, MVI, folate -zinc -lactulose   Hx SVT Sinus tach Prolonged Qtc, improved -monitor electrolytes and replete PRN -monitor on tele  Protein caloric malnutrition, mild -TF  Hyperglycemia, controlled -con't SSI PRN -TF coverage 2 units q4h -goal BG 140-180   Sister updated at bedside during rounds.  Best Practice (right click and "Reselect all SmartList Selections" daily)   Diet/type: tubefeeds DVT prophylaxis: prophylactic heparin  GI prophylaxis: PPI Lines: Central line Foley:  Yes, and it is still needed Code Status:  full code Last date of multidisciplinary goals of care discussion [updated mother at bedside]  Labs   CBC: Recent Labs  Lab 06/05/22 0345 06/06/22 0325 06/07/22 0320  06/08/22 0518 06/10/22 0425  WBC 12.0* 10.4 11.3* 10.7* 10.3  NEUTROABS  --   --   --  8.2*  --   HGB 9.1* 8.5* 8.6* 8.4* 8.8*  HCT 27.5* 26.9* 27.2* 25.5* 27.0*  MCV 111.8* 115.9* 117.2* 114.9* 116.9*  PLT 145* 134* 139* 125* 155     Basic Metabolic Panel: Recent Labs  Lab 06/04/22 0329 06/05/22 0345 06/05/22 1430 06/06/22 0325 06/07/22 0320 06/08/22 0518 06/10/22 0425  NA 148* 157* 157* 155* 153* 143 142  K 4.1 2.8* 3.7 3.6 4.3 3.2* 4.1  CL 103 110 111 109 111 105 102  CO2 31 35* 36* 34* 31 29 27   GLUCOSE 214* 148* 135* 165* 129* 160* 140*  BUN 19 31* 33* 36* 39* 37* 31*  CREATININE 0.58 0.71 0.64 0.64 0.74 0.68 0.58  CALCIUM 10.0 10.9* 10.7* 10.9* 10.6* 9.5 9.9  MG 2.1 2.2  --  2.2 2.3  --  2.5*  PHOS 7.3* 4.2  --  3.4 3.9  --  3.1    GFR: Estimated Creatinine Clearance: 81.7 mL/min (by C-G formula based on SCr of 0.58 mg/dL). Recent Labs  Lab 06/06/22 0325 06/07/22 0320 06/08/22 0518 06/10/22 0425  WBC 10.4 11.3* 10.7* 10.3  This patient is critically ill with multiple organ system failure which requires frequent high complexity decision making, assessment, support, evaluation, and titration of therapies. This was completed through the application of advanced monitoring technologies and extensive interpretation of multiple databases. During this encounter critical care time was devoted to patient care services described in this note for 36 minutes.  Julian Hy, DO 06/10/22 12:08 PM [facility]  For contact information, see Amion. If no response to pager, please call PCCM consult pager. After hours, 7PM- 7AM, please call Elink.

## 2022-06-10 NOTE — Progress Notes (Signed)
I spoke to Cardiology regarding TEE. Due to crowded mouth with ETT in place, she was previously unable to have TEE performed. Plan has been to complete this after extubation. We will reconsult at that time.   Julian Hy, DO 06/10/22 3:04 PM [facility]

## 2022-06-11 ENCOUNTER — Other Ambulatory Visit (HOSPITAL_COMMUNITY): Payer: 59

## 2022-06-11 DIAGNOSIS — J810 Acute pulmonary edema: Secondary | ICD-10-CM | POA: Diagnosis not present

## 2022-06-11 DIAGNOSIS — A329 Listeriosis, unspecified: Secondary | ICD-10-CM | POA: Insufficient documentation

## 2022-06-11 DIAGNOSIS — A419 Sepsis, unspecified organism: Secondary | ICD-10-CM | POA: Insufficient documentation

## 2022-06-11 DIAGNOSIS — Z9911 Dependence on respirator [ventilator] status: Secondary | ICD-10-CM

## 2022-06-11 DIAGNOSIS — A3211 Listerial meningitis: Secondary | ICD-10-CM

## 2022-06-11 DIAGNOSIS — R6520 Severe sepsis without septic shock: Secondary | ICD-10-CM | POA: Insufficient documentation

## 2022-06-11 LAB — FOLATE: Folate: 31.2 ng/mL (ref >5.9)

## 2022-06-11 LAB — GLUCOSE, CAPILLARY
Glucose-Capillary: 121 mg/dL — ABNORMAL HIGH (ref 70–99)
Glucose-Capillary: 123 mg/dL — ABNORMAL HIGH (ref 70–99)
Glucose-Capillary: 140 mg/dL — ABNORMAL HIGH (ref 70–99)
Glucose-Capillary: 144 mg/dL — ABNORMAL HIGH (ref 70–99)
Glucose-Capillary: 153 mg/dL — ABNORMAL HIGH (ref 70–99)
Glucose-Capillary: 171 mg/dL — ABNORMAL HIGH (ref 70–99)

## 2022-06-11 LAB — BASIC METABOLIC PANEL
Anion gap: 12 (ref 5–15)
BUN: 35 mg/dL — ABNORMAL HIGH (ref 6–20)
CO2: 27 mmol/L (ref 22–32)
Calcium: 9.7 mg/dL (ref 8.9–10.3)
Chloride: 105 mmol/L (ref 98–111)
Creatinine, Ser: 0.61 mg/dL (ref 0.44–1.00)
GFR, Estimated: >60 mL/min (ref >60)
Glucose, Bld: 144 mg/dL — ABNORMAL HIGH (ref 70–99)
Potassium: 3.9 mmol/L (ref 3.5–5.1)
Sodium: 144 mmol/L (ref 135–145)

## 2022-06-11 LAB — CBC
HCT: 27.7 % — ABNORMAL LOW (ref 36.0–46.0)
Hemoglobin: 8.7 g/dL — ABNORMAL LOW (ref 12.0–15.0)
MCH: 37.5 pg — ABNORMAL HIGH (ref 26.0–34.0)
MCHC: 31.4 g/dL (ref 30.0–36.0)
MCV: 119.4 fL — ABNORMAL HIGH (ref 80.0–100.0)
Platelets: 176 10*3/uL (ref 150–400)
RBC: 2.32 MIL/uL — ABNORMAL LOW (ref 3.87–5.11)
RDW: 18.4 % — ABNORMAL HIGH (ref 11.5–15.5)
WBC: 9.6 10*3/uL (ref 4.0–10.5)
nRBC: 0.3 % — ABNORMAL HIGH (ref 0.0–0.2)

## 2022-06-11 LAB — GENTAMICIN LEVEL, PEAK: Gentamicin Pk: 1.4 ug/mL — ABNORMAL LOW (ref 5.0–10.0)

## 2022-06-11 LAB — VITAMIN B12: Vitamin B-12: 1454 pg/mL — ABNORMAL HIGH (ref 180–914)

## 2022-06-11 LAB — MAGNESIUM: Magnesium: 2.1 mg/dL (ref 1.7–2.4)

## 2022-06-11 LAB — PHOSPHORUS: Phosphorus: 3.7 mg/dL (ref 2.5–4.6)

## 2022-06-11 MED ORDER — FUROSEMIDE 10 MG/ML IJ SOLN
80.0000 mg | Freq: Four times a day (QID) | INTRAMUSCULAR | Status: DC
  Administered 2022-06-11 – 2022-06-14 (×12): 80 mg via INTRAVENOUS
  Filled 2022-06-11 (×11): qty 8

## 2022-06-11 MED ORDER — FUROSEMIDE 10 MG/ML IJ SOLN
80.0000 mg | Freq: Two times a day (BID) | INTRAMUSCULAR | Status: DC
  Filled 2022-06-11: qty 8

## 2022-06-11 MED ORDER — RIFAXIMIN 550 MG PO TABS
550.0000 mg | ORAL_TABLET | Freq: Two times a day (BID) | ORAL | Status: DC
  Administered 2022-06-11 – 2022-06-15 (×9): 550 mg
  Filled 2022-06-11 (×9): qty 1

## 2022-06-11 NOTE — Progress Notes (Signed)
[facility]ious [facility]    Date of Admission:  06/20/2022   Total days of antibiotics 13   ID: Deanna Mcmillan is a 51 y.o. female with cirrhosis with sepsis and respiratory distress with listeria meningitis/bacteremia but also found to have concurrent mssa bacterea Principal Problem:   Acute respiratory failure with hypoxia (HCC) Active Problems:   Pressure injury of skin   Toxic metabolic encephalopathy   On mechanically assisted ventilation (HCC)   Sepsis with acute hypoxic respiratory failure (HCC)   Listeria meningitis   Acute pulmonary edema (HCC)    Subjective: Afebrile but can open eyes to verbal stimuli, does not track nor follow commands  Less peripheral edema  Medications:   Chlorhexidine Gluconate Cloth  6 each Topical Daily   famotidine  20 mg Per Tube BID   folic acid  1 mg Per Tube Daily   furosemide  80 mg Intravenous Q6H   heparin  5,000 Units Subcutaneous Q8H   insulin aspart  0-15 Units Subcutaneous Q4H   insulin aspart  2 Units Subcutaneous Q4H   lactulose  10 g Per Tube TID   mouth rinse  15 mL Mouth Rinse Q2H   phosphorus  250 mg Per Tube Daily   potassium chloride  40 mEq Per Tube BID   rifaximin  550 mg Per Tube BID   spironolactone  50 mg Per Tube Daily   thiamine  100 mg Per Tube Daily   zinc sulfate  220 mg Per Tube Daily    Objective: Vital signs in last 24 hours: Temp:  [98.1 F (36.7 C)-98.6 F (37 C)] 98.4 F (36.9 C) (11/20 1532) Pulse Rate:  [71-92] 91 (11/20 1600) Resp:  [14-24] 19 (11/20 1600) BP: (93-122)/(47-84) 112/56 (11/20 1600) SpO2:  [91 %-99 %] 96 % (11/20 1600) FiO2 (%):  [40 %] 40 % (11/20 1546) Weight:  [73.6 kg] 73.6 kg (11/20 0500)  Physical Exam  Constitutional:  sedated. appears well-developed and well-nourished. No distress.  HENT: [place]/AT, PERRLA, no scleral icterus Mouth/Throat: OETT in place. No oropharyngeal exudate.  Cardiovascular: Normal rate, regular rhythm and normal heart sounds.  Exam reveals no gallop and no friction rub.  No murmur heard.  Pulmonary/Chest: Effort normal and breath sounds normal. No respiratory distress.  has no wheezes.  Neck = supple, no nuchal rigidity Abdominal: Soft. Bowel sounds are normal.  exhibits no distension. There is no tenderness.  Skin: scattered echymosis. +1 pitting edema   Lab Results Recent Labs    06/10/22 0425 06/11/22 0336  WBC 10.3 9.6  HGB 8.8* 8.7*  HCT 27.0* 27.7*  NA 142 144  K 4.1 3.9  CL 102 105  CO2 27 27  BUN 31* 35*  CREATININE 0.58 0.61   Liver Panel No results for input(s): "PROT", "ALBUMIN", "AST", "ALT", "ALKPHOS", "BILITOT", "BILIDIR", "IBILI" in the last 72 hours. Sedimentation Rate No results for input(s): "ESRSEDRATE" in the last 72 hours. C-Reactive Protein No results for input(s): "CRP" in the last 72 hours.  Microbiology:  Studies/Results: No results found.   Assessment/Plan: Listeria meningitis and bacteremia = continue on gent and ampicillin with plan of minimum of 21 days of treatment, longer depending on overall improvement  Mssa bacteremia = currently day 13 of treatment since negative culture. Unable to get TEE while intubated. Continue with cefazolin  Cirrhosis and encephalopathy = may benefit from rifaximin    [facility] Pager: [phone]  06/11/2022, 5:45 PM

## 2022-06-11 NOTE — Progress Notes (Signed)
NAME:  Deanna Mcmillan, MRN:  [id number], DOB:  1970/11/27, LOS: 24 ADMISSION DATE:  06/16/2022, CONSULTATION DATE:  11/7 REFERRING MD:  Lendon Colonel, EDP CHIEF COMPLAINT:  AMS   History of Present Illness:  51 yo woman with hx of recent diagnosis of alcoholic hepatitis (dx 40/34), here with ams, fever.   History from [name].  Patient was in her normal state of health, until suddenly last night she developed a headache.  She went to sleep to rest.  Today she slept throughout the day, this evening her mother went to wake her and was unable to wake her up, so she called 911.     She has been doing well since her recent hospital discharge, taking all the meds as prescribed, no new meds.  Planned follow up with GI on 11/14.  LE edema had been improving.  No cough, sob no focal symptoms.  No known sick contacts.     In ED, febrile, tachycardic, lethargic, desaturating to 70s.   Intubated by ED physician.    Had been admitted earlier in October and started on course of prenisone for alc hepatitis.    Recently seen at [facility] 10/19: LE Edema. Lasix not helpful at home.  Admitted for diuresis.  Jaundice and abd distension also noted.  Pleural effusion.  GI saw: non enough ascites for tap  Started lasix and spironolactone.  28 day course of prednisolone, miralax, lactulose, miralax. Midodrine TID    In ED got cefepime, LR 1L, flagyl ordered, intubated, started on Propofol, vanc ordered   Pertinent  Medical History  ETOH Cirrhosis BLE edema recently worsening  S/p lap appy 2014    Echo: recent normal 74/2595   Home meds: folic ascid, acetaminohpen, lasix 40bid, lactulose 30 TID protonix, prednisone (04/29/22), prednisolone 04/28/22 three week course, thiamine, simethicone,    Per her mother she had been drinking at least one bottle of wine daily, she isnt clear exactly how much she was drinking but she did also drink [place].  She notes she had wanted to stop recently but been unable to.   She thinks she was depressed after experiencing perimenopause.  No tobacco.   Significant Hospital Events: Including procedures, antibiotic start and stop dates in addition to other pertinent events   11/7: Intubated in ED and admitted to ICU 11/7: [place] neg, CT A/P cirrhosis with portal hypertension, splenomegaly, moderate volume ascites, moderate bilateral pleural effusion 11/9 KUB: No ileus 11/10: LP confirms listeria meningitis 11/11 KUB: ileus 11/12 ileus resolved with aggressive bowel regimen 11/15 KUB: No ileus. 11/18 more awake, following commands   Interim History / Subjective:  No acute overnight events, remained afebrile. Continuing to attempt vent weaning as tolerated. Plan for TEE once fully extubated given narrow oral cavity.   Objective   Blood pressure (!) 110/51, pulse 92, temperature 98.6 F (37 C), temperature source Oral, resp. rate (!) 23, weight 73.6 kg, SpO2 95 %.    Vent Mode: PSV;CPAP FiO2 (%):  [40 %] 40 % Set Rate:  [20 bmp] 20 bmp Vt Set:  [420 mL] 420 mL PEEP:  [5 cmH20] 5 cmH20 Pressure Support:  [12 cmH20] 12 cmH20 Plateau Pressure:  [14 cmH20] 14 cmH20   Intake/Output Summary (Last 24 hours) at 06/11/2022 1007 Last data filed at 06/11/2022 0700 Gross per 24 hour  Intake 2271.75 ml  Output 3700 ml  Net -1428.25 ml   Filed Weights   06/09/22 0500 06/10/22 0424 06/11/22 0500  Weight: 72.7 kg  75.1 kg 73.6 kg   Examination: General: critically ill-appearing middle aged woman, lying in bed, NAD. HENT: [place]/AT, anicteric sclera, ETT in place.  CV: normal rate and regular rhythm, no m/r/g. Pulm: coarse anterior breath sounds but no overt adventitious sounds noted. Abdomen: soft, mildly distended, nontender to palpation. Extremities: 1+ edema in bilateral LE, compression stockings in place. Skin: warm and dry, no diffuse rashes noted.  Neuro: Spontaneous opens eyes intermittently, unable to consistently follow commands.    Hgb 8.7,  stable Leukocytosis and thrombocytopenia resolved since yesterday BUN 35 Cr 0.61  Resolved Hospital Problem list   Ileus Hypokalemia Hypophosphatemia Hypernatremia Thrombocytopenia  Assessment & Plan:   Severe sepsis, resolving  Acute metabolic encephalopathy, improving  Listeria & MSSA bacteremia Listeria meningitis -appreciate ID assistance -currently on ampicillin, gentamicin, cefazolin -given narrow oral cavity and crowding with ETT in place, will await for extubation prior to re-consulting cardiology for TEE   Acute hypoxic respiratory failure CAP versus aspiration pneumonia Compressive atelectasis -LTVV -VAP prevention protocol -PAD protocol for sedation; goal RASS 0 to -1 -antibiotics as above -Mobility as able -likely some component from volume up state, will continue diuresis  Fluid overloaded -continue aggressive diuresis + empiric potassium repletion -strict I/O  Alcoholic cirrhosis Hepatic encephalopathy Recently diagnosed cirrhosis during last hospitalization.  Concern for possible hepatic encephalopathy and possible SBP on admission. MELD Na score of 17. Child Pugh Class C. FIB-4 score of 7.46 indicates advance fibrosis. Peritoneal labs not consistent with SBP, however, patient received abx prior to tap. Having liquid stools so will insert flexiseal. Plan: -lasix + spironolactone -thiamine, MVI, folate -zinc -lactulose -added rifaximin today  Hx SVT Sinus tach Prolonged Qtc, improved -monitor electrolytes and replete PRN -monitor on tele  Protein caloric malnutrition, mild -TF  Hyperglycemia, controlled -con't SSI PRN -TF coverage 2 units q4h -goal BG 140-180  Hypernatremia, resolved -monitor off free water  Mother updated at bedside during rounds.  Best Practice (right click and "Reselect all SmartList Selections" daily)   Diet/type: tubefeeds DVT prophylaxis: prophylactic heparin  GI prophylaxis: PPI Lines: Central line Foley:   Yes, and it is still needed Code Status:  full code Last date of multidisciplinary goals of care discussion [updated mother at bedside]  Labs   CBC: Recent Labs  Lab 06/06/22 0325 06/07/22 0320 06/08/22 0518 06/10/22 0425 06/11/22 0336  WBC 10.4 11.3* 10.7* 10.3 9.6  NEUTROABS  --   --  8.2*  --   --   HGB 8.5* 8.6* 8.4* 8.8* 8.7*  HCT 26.9* 27.2* 25.5* 27.0* 27.7*  MCV 115.9* 117.2* 114.9* 116.9* 119.4*  PLT 134* 139* 125* 155 409    Basic Metabolic Panel: Recent Labs  Lab 06/05/22 0345 06/05/22 1430 06/06/22 0325 06/07/22 0320 06/08/22 0518 06/10/22 0425 06/11/22 0336  NA 157*   < > 155* 153* 143 142 144  K 2.8*   < > 3.6 4.3 3.2* 4.1 3.9  CL 110   < > 109 111 105 102 105  CO2 35*   < > 34* 31 29 27 27   GLUCOSE 148*   < > 165* 129* 160* 140* 144*  BUN 31*   < > 36* 39* 37* 31* 35*  CREATININE 0.71   < > 0.64 0.74 0.68 0.58 0.61  CALCIUM 10.9*   < > 10.9* 10.6* 9.5 9.9 9.7  MG 2.2  --  2.2 2.3  --  2.5* 2.1  PHOS 4.2  --  3.4 3.9  --  3.1 3.7   < > =  values in this interval not displayed.   GFR: Estimated Creatinine Clearance: 80.9 mL/min (by C-G formula based on SCr of 0.61 mg/dL). Recent Labs  Lab 06/07/22 0320 06/08/22 0518 06/10/22 0425 06/11/22 0336  WBC 11.3* 10.7* 10.3 9.6      Virl Axe, MD 06/11/2022, 10:07 AM

## 2022-06-12 ENCOUNTER — Inpatient Hospital Stay: Payer: Self-pay

## 2022-06-12 DIAGNOSIS — J9601 Acute respiratory failure with hypoxia: Secondary | ICD-10-CM | POA: Diagnosis not present

## 2022-06-12 DIAGNOSIS — Z9911 Dependence on respirator [ventilator] status: Secondary | ICD-10-CM | POA: Diagnosis not present

## 2022-06-12 DIAGNOSIS — Z7189 Other specified counseling: Secondary | ICD-10-CM | POA: Diagnosis not present

## 2022-06-12 DIAGNOSIS — R5381 Other malaise: Secondary | ICD-10-CM

## 2022-06-12 LAB — CBC WITH DIFFERENTIAL/PLATELET
Abs Immature Granulocytes: 0.14 10*3/uL — ABNORMAL HIGH (ref 0.00–0.07)
Basophils Absolute: 0 10*3/uL (ref 0.0–0.1)
Basophils Relative: 0 %
Eosinophils Absolute: 0.1 10*3/uL (ref 0.0–0.5)
Eosinophils Relative: 1 %
HCT: 26.8 % — ABNORMAL LOW (ref 36.0–46.0)
Hemoglobin: 8.9 g/dL — ABNORMAL LOW (ref 12.0–15.0)
Immature Granulocytes: 1 %
Lymphocytes Relative: 13 %
Lymphs Abs: 1.4 10*3/uL (ref 0.7–4.0)
MCH: 38.5 pg — ABNORMAL HIGH (ref 26.0–34.0)
MCHC: 33.2 g/dL (ref 30.0–36.0)
MCV: 116 fL — ABNORMAL HIGH (ref 80.0–100.0)
Monocytes Absolute: 1.2 10*3/uL — ABNORMAL HIGH (ref 0.1–1.0)
Monocytes Relative: 11 %
Neutro Abs: 8.1 10*3/uL — ABNORMAL HIGH (ref 1.7–7.7)
Neutrophils Relative %: 74 %
Platelets: 207 10*3/uL (ref 150–400)
RBC: 2.31 MIL/uL — ABNORMAL LOW (ref 3.87–5.11)
RDW: 18.3 % — ABNORMAL HIGH (ref 11.5–15.5)
WBC: 11 10*3/uL — ABNORMAL HIGH (ref 4.0–10.5)
nRBC: 0.2 % (ref 0.0–0.2)

## 2022-06-12 LAB — COMPREHENSIVE METABOLIC PANEL
ALT: 33 U/L (ref 0–44)
AST: 103 U/L — ABNORMAL HIGH (ref 15–41)
Albumin: 2.7 g/dL — ABNORMAL LOW (ref 3.5–5.0)
Alkaline Phosphatase: 124 U/L (ref 38–126)
Anion gap: 14 (ref 5–15)
BUN: 42 mg/dL — ABNORMAL HIGH (ref 6–20)
CO2: 26 mmol/L (ref 22–32)
Calcium: 9 mg/dL (ref 8.9–10.3)
Chloride: 105 mmol/L (ref 98–111)
Creatinine, Ser: 0.75 mg/dL (ref 0.44–1.00)
GFR, Estimated: >60 mL/min (ref >60)
Glucose, Bld: 164 mg/dL — ABNORMAL HIGH (ref 70–99)
Potassium: 3.7 mmol/L (ref 3.5–5.1)
Sodium: 145 mmol/L (ref 135–145)
Total Bilirubin: 2.7 mg/dL — ABNORMAL HIGH (ref 0.3–1.2)
Total Protein: 6.5 g/dL (ref 6.5–8.1)

## 2022-06-12 LAB — GLUCOSE, CAPILLARY
Glucose-Capillary: 147 mg/dL — ABNORMAL HIGH (ref 70–99)
Glucose-Capillary: 144 mg/dL — ABNORMAL HIGH (ref 70–99)
Glucose-Capillary: 154 mg/dL — ABNORMAL HIGH (ref 70–99)
Glucose-Capillary: 155 mg/dL — ABNORMAL HIGH (ref 70–99)
Glucose-Capillary: 159 mg/dL — ABNORMAL HIGH (ref 70–99)

## 2022-06-12 LAB — GENTAMICIN LEVEL, TROUGH: Gentamicin Trough: <0.5 ug/mL — ABNORMAL LOW (ref 0.5–2.0)

## 2022-06-12 MED ORDER — SODIUM CHLORIDE 0.9% FLUSH
10.0000 mL | Freq: Two times a day (BID) | INTRAVENOUS | Status: DC
  Administered 2022-06-12: 30 mL
  Administered 2022-06-13 – 2022-06-25 (×23): 10 mL
  Administered 2022-06-25 – 2022-06-26 (×2): 30 mL
  Administered 2022-06-26: 10 mL
  Administered 2022-06-27: 40 mL
  Administered 2022-06-27 – 2022-07-03 (×13): 10 mL

## 2022-06-12 MED ORDER — GENTAMICIN IN SALINE 1.2-0.9 MG/ML-% IV SOLN
60.0000 mg | INTRAVENOUS | Status: AC
  Administered 2022-06-12 – 2022-06-15 (×4): 60 mg via INTRAVENOUS
  Filled 2022-06-12 (×4): qty 50

## 2022-06-12 MED ORDER — METOLAZONE 2.5 MG PO TABS
5.0000 mg | ORAL_TABLET | Freq: Once | ORAL | Status: AC
  Administered 2022-06-12: 5 mg
  Filled 2022-06-12: qty 2

## 2022-06-12 MED ORDER — SODIUM CHLORIDE 0.9% FLUSH: 10.0000 mL | INTRAVENOUS | Status: DC | PRN

## 2022-06-12 MED ORDER — POTASSIUM CHLORIDE 10 MEQ/50ML IV SOLN
10.0000 meq | INTRAVENOUS | Status: AC
  Administered 2022-06-12 (×4): 10 meq via INTRAVENOUS
  Filled 2022-06-12 (×4): qty 50

## 2022-06-12 NOTE — Progress Notes (Signed)
Physical Therapy Treatment Patient Details Name: Deanna Mcmillan MRN: [id number] DOB: [birth year] Today's Date: 06/12/2022   History of Present Illness 51 yo female admitted 11/7 after unable to arouse at home. Intubated 11/7. 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 PMhx: ETOH hepatitis    PT Comments    Pt sleeping on arrival. Remains intubated. Facial grimacing and eye flickering, but no eye opening or command follow. Bed placed in chair position, but pt unble to tolerate due to orthostasis. BP 123/58 prior to session. Shaking/tremulous movement noted while in chair position with BP 102/33. Improved to 127/65 after return to previous position. PROM BUE/LE.    Recommendations for follow up therapy are one component of a multi-disciplinary discharge planning process, led by the attending physician.  Recommendations may be updated based on patient status, additional functional criteria and insurance authorization.  Follow Up Recommendations  PT at Long-term acute care hospital     Assistance Recommended at Discharge Frequent or constant Supervision/Assistance  Patient can return home with the following Two people to help with walking and/or transfers;Two people to help with bathing/dressing/bathroom;Direct supervision/assist for financial management;Direct supervision/assist for medications management;Assistance with feeding;Assistance with cooking/housework;Assist for transportation   Equipment Recommendations  Wheelchair (measurements PT);Hospital bed;Wheelchair cushion (measurements PT);Other (comment) (hoyer)    Recommendations for Other Services       Precautions / Restrictions Precautions Precautions: Fall;Other (comment) Precaution Comments: vent, ETT, cortrak Restrictions Weight Bearing Restrictions: No     Mobility  Bed Mobility Overal bed mobility: Needs Assistance             General bed mobility comments: total assist. Bed egress to chair position. Required return  to supine (HOB 30 degrees) due to +orthostatis    Transfers                        Ambulation/Gait                   Stairs             Wheelchair Mobility    Modified Rankin (Stroke Patients Only)       Balance                                            Cognition Arousal/Alertness: Lethargic Behavior During Therapy: Flat affect Overall Cognitive Status: Difficult to assess                                 General Comments: Very lethargic. No eye opening this session. Facial grimacing during PROM.        Exercises Other Exercises Other Exercises: PROM BUE/LE. Clonus noted BLE    General Comments General comments (skin integrity, edema, etc.): BP 123/58 prior to session. After 1 minute with bed in chair position, pt with shaking/tremulous movements BUE and trunk. BP 102/33. Returned to [facility] at 30 degress. Shaking ceased and BP 127/65      Pertinent Vitals/Pain Pain Assessment Pain Assessment: CPOT Facial Expression: Relaxed, neutral Body Movements: Absence of movements Muscle Tension: Relaxed Compliance with ventilator (intubated pts.): Tolerating ventilator or movement Vocalization (extubated pts.): N/A CPOT Total: 0    Home Living  Prior Function            PT Goals (current goals can now be found in the care plan section) Acute Rehab PT Goals Patient Stated Goal: not stated Progress towards PT goals: Not progressing toward goals - comment (lethargic)    Frequency    Min 2X/week      PT Plan Current plan remains appropriate    Co-evaluation              AM-PAC PT "6 Clicks" Mobility   Outcome Measure  Help needed turning from your back to your side while in a flat bed without using bedrails?: Total Help needed moving from lying on your back to sitting on the side of a flat bed without using bedrails?: Total Help needed moving to and from a bed  to a chair (including a wheelchair)?: Total Help needed standing up from a chair using your arms (e.g., wheelchair or bedside chair)?: Total Help needed to walk in hospital room?: Total Help needed climbing 3-5 steps with a railing? : Total 6 Click Score: 6    End of Session Equipment Utilized During Treatment: Oxygen (vent, ETT) Activity Tolerance: Patient limited by lethargy Patient left: in bed;with family/visitor present;with call bell/phone within reach Nurse Communication: Mobility status PT Visit Diagnosis: Other abnormalities of gait and mobility (R26.89);Muscle weakness (generalized) (M62.81);Other symptoms and signs involving the nervous system (Y18.563)     Time: 1497-0263 PT Time Calculation (min) (ACUTE ONLY): 20 min  Charges:  $Therapeutic Activity: 8-22 mins                     Lorrin Goodell, PT  Office # [phone] Pager [phone]    Lorriane Shire 06/12/2022, 8:48 AM

## 2022-06-12 NOTE — Progress Notes (Signed)
NAME:  Deanna Mcmillan, MRN:  [id number], DOB:  [birth year], LOS: 27 ADMISSION DATE:  06/12/2022, CONSULTATION DATE:  11/7 REFERRING MD:  Lendon Colonel, EDP CHIEF COMPLAINT:  AMS   History of Present Illness:  51 yo woman with hx of recent diagnosis of alcoholic hepatitis (dx 79/15), here with ams, fever.   History from her mother Deanna Mcmillan.  Patient was in her normal state of health, until suddenly the night PTA she developed a headache.  She went to sleep to rest.  The following day she slept throughout the day, that evening her mother went to wake her and was unable to wake her up, so she called 911.     She has been doing well since her recent hospital discharge, taking all the meds as prescribed, no new meds.  Planned follow up with GI on 11/14.  LE edema had been improving.  No cough, sob no focal symptoms.  No known sick contacts.     In ED, febrile, tachycardic, lethargic, desaturating to 70s.   Intubated by ED physician.    Had been admitted earlier in October and started on course of prenisone for alc hepatitis.    Recently seen at [facility] 10/19: LE Edema. Lasix not helpful at home.  Admitted for diuresis.  Jaundice and abd distension also noted.  Pleural effusion.  GI saw: non enough ascites for tap  Started lasix and spironolactone.  28 day course of prednisolone, miralax, lactulose, miralax. Midodrine TID    In ED got cefepime, LR 1L, flagyl ordered, intubated, started on Propofol, vanc ordered   Pertinent  Medical History  ETOH Cirrhosis BLE edema recently worsening  S/p lap appy 2014    Echo: recent normal 11/6977   Home meds: folic ascid, acetaminohpen, lasix 40bid, lactulose 30 TID protonix, prednisone (04/29/22), prednisolone 04/28/22 three week course, thiamine, simethicone,    Per her mother she had been drinking at least one bottle of wine daily, she isnt clear exactly how much she was drinking but she did also drink [place].  She notes she had wanted to stop recently but  been unable to.  She thinks she was depressed after experiencing perimenopause.  No tobacco.   Significant Hospital Events: Including procedures, antibiotic start and stop dates in addition to other pertinent events   11/7: Intubated in ED and admitted to ICU 11/7: [place] neg, CT A/P cirrhosis with portal hypertension, splenomegaly, moderate volume ascites, moderate bilateral pleural effusion 11/9 KUB: No ileus 11/10: LP confirms listeria meningitis 11/11 KUB: ileus 11/12 ileus resolved with aggressive bowel regimen 11/15 KUB: No ileus. 11/18 more awake, following commands   Interim History / Subjective:  No acute overnight events. Eyes were crusted shut but upon opening, she did track. No purposeful movement this AM, but mother notes that she is more responsive in the afternoons as she is more drowsy in the mornings.   Objective   Blood pressure (!) 131/55, pulse (!) 103, temperature 98.4 F (36.9 C), temperature source Axillary, resp. rate (!) 22, weight 73.6 kg, SpO2 97 %.    Vent Mode: PSV;CPAP FiO2 (%):  [40 %] 40 % Set Rate:  [20 bmp] 20 bmp Vt Set:  [420 mL] 420 mL PEEP:  [5 cmH20] 5 cmH20 Pressure Support:  [12 cmH20] 12 cmH20 Plateau Pressure:  [15 cmH20-16 cmH20] 15 cmH20   Intake/Output Summary (Last 24 hours) at 06/12/2022 1426 Last data filed at 06/12/2022 1100 Gross per 24 hour  Intake 2216.29 ml  Output 3200 ml  Net -983.71 ml   Filed Weights   06/09/22 0500 06/10/22 0424 06/11/22 0500  Weight: 72.7 kg 75.1 kg 73.6 kg   Examination: General: critically ill-appearing middle aged woman, laying in bed on mech vent, NAD. HENT: [place]/AT, ETT in place. CV: normal rate and regular rhythm, normal S1/S2. Pulm: coarse anterior breath sounds, no adventitious sounds noted. Tolerating PS 12.  Abdomen: soft, mildly distended, nontender. Extremities: 1-2+ pitting edema in bilateral LE, compression stockings in place. Skin: thin skin with bruising noted on UE bilaterally.  Jaundiced. Neuro: drowsy, unable to follow commands, moving UE spontaneously.       Latest Ref Rng & Units 06/12/2022    5:41 AM 06/11/2022    3:36 AM 06/10/2022    4:25 AM  CBC  WBC 4.0 - 10.5 K/uL 11.0  9.6  10.3   Hemoglobin 12.0 - 15.0 g/dL 8.9  8.7  8.8   Hematocrit 36.0 - 46.0 % 26.8  27.7  27.0   Platelets 150 - 400 K/uL 207  176  155       Latest Ref Rng & Units 06/12/2022    5:41 AM 06/11/2022    3:36 AM 06/10/2022    4:25 AM  CMP  Glucose 70 - 99 mg/dL 164  144  140   BUN 6 - 20 mg/dL 42  35  31   Creatinine 0.44 - 1.00 mg/dL 0.75  0.61  0.58   Sodium 135 - 145 mmol/L 145  144  142   Potassium 3.5 - 5.1 mmol/L 3.7  3.9  4.1   Chloride 98 - 111 mmol/L 105  105  102   CO2 22 - 32 mmol/L 26  27  27    Calcium 8.9 - 10.3 mg/dL 9.0  9.7  9.9   Total Protein 6.5 - 8.1 g/dL 6.5     Total Bilirubin 0.3 - 1.2 mg/dL 2.7     Alkaline Phos 38 - 126 U/L 124     AST 15 - 41 U/L 103     ALT 0 - 44 U/L 33        Resolved Hospital Problem list   Ileus Hypokalemia Hypophosphatemia Hypernatremia Thrombocytopenia  Assessment & Plan:   Severe sepsis, resolving  Acute metabolic encephalopathy, improving  Listeria & MSSA bacteremia Listeria meningitis -appreciate ID assistance -currently on ampicillin, gentamicin, cefazolin (per ID, will need at least 21 days of amp/gent) -given narrow oral cavity and crowding with ETT in place, will await for extubation prior to re-consulting cardiology for TEE   Acute hypoxic respiratory failure CAP versus aspiration pneumonia Compressive atelectasis Failed SBT on PS 10 today, likely from neuromuscular weakness. Will continue aggressive weaning efforts, but discussed with family that if she continues to fail these attempts, may need to consider long-term airway management. -LTVV -VAP prevention protocol -PAD protocol for sedation; goal RASS 0 to -1 -antibiotics as above -Mobility as able -likely some component from volume up state,  will continue diuresis -palliative care consulted to help clarify goals and provide support  Fluid overloaded Net -700cc overnight, still is hypervolemic on exam. Will provide dose of metolazone to enhance diuresis. -continue aggressive diuresis + empiric potassium repletion -metolazone -strict I/O  Alcoholic cirrhosis Hepatic encephalopathy Recently diagnosed cirrhosis during last hospitalization.  Concern for possible hepatic encephalopathy and possible SBP on admission. MELD Na score of 17. Child Pugh Class C. FIB-4 score of 7.46 indicates advance fibrosis. Peritoneal labs not consistent with SBP, however, patient received abx prior to tap. Having  liquid stools so will insert flexiseal. Plan: -lasix + spironolactone -thiamine, MVI, folate, zinc -lactulose and rifaximin -avoid sedation as able  Hx SVT Sinus tach Prolonged Qtc, improved -monitor electrolytes and replete PRN -monitor on tele  Protein caloric malnutrition, mild -TF  Hyperglycemia, controlled -con't SSI PRN -TF coverage 2 units q4h -goal BG 140-180  Hypernatremia, resolved -monitor off free water  Mother updated at bedside during rounds.  Best Practice (right click and "Reselect all SmartList Selections" daily)   Diet/type: tubefeeds DVT prophylaxis: prophylactic heparin  GI prophylaxis: PPI Lines: Central line Foley:  Yes, and it is still needed Code Status:  full code Last date of multidisciplinary goals of care discussion [updated mother at bedside]  Labs   CBC: Recent Labs  Lab 06/07/22 0320 06/08/22 0518 06/10/22 0425 06/11/22 0336 06/12/22 0541  WBC 11.3* 10.7* 10.3 9.6 11.0*  NEUTROABS  --  8.2*  --   --  8.1*  HGB 8.6* 8.4* 8.8* 8.7* 8.9*  HCT 27.2* 25.5* 27.0* 27.7* 26.8*  MCV 117.2* 114.9* 116.9* 119.4* 116.0*  PLT 139* 125* 155 176 235    Basic Metabolic Panel: Recent Labs  Lab 06/06/22 0325 06/07/22 0320 06/08/22 0518 06/10/22 0425 06/11/22 0336 06/12/22 0541  NA 155*  153* 143 142 144 145  K 3.6 4.3 3.2* 4.1 3.9 3.7  CL 109 111 105 102 105 105  CO2 34* 31 29 27 27 26   GLUCOSE 165* 129* 160* 140* 144* 164*  BUN 36* 39* 37* 31* 35* 42*  CREATININE 0.64 0.74 0.68 0.58 0.61 0.75  CALCIUM 10.9* 10.6* 9.5 9.9 9.7 9.0  MG 2.2 2.3  --  2.5* 2.1  --   PHOS 3.4 3.9  --  3.1 3.7  --    GFR: Estimated Creatinine Clearance: 80.9 mL/min (by C-G formula based on SCr of 0.75 mg/dL). Recent Labs  Lab 06/08/22 0518 06/10/22 0425 06/11/22 0336 06/12/22 0541  WBC 10.7* 10.3 9.6 11.0Virl Axe, MD 06/12/2022, 2:26 PM

## 2022-06-12 NOTE — Progress Notes (Signed)
Peripherally Inserted Central Catheter Placement  The IV Nurse has discussed with the patient and/or persons authorized to consent for the patient, the purpose of this procedure and the potential benefits and risks involved with this procedure.  The benefits include less needle sticks, lab draws from the catheter, and the patient may be discharged home with the catheter. Risks include, but not limited to, infection, bleeding, blood clot (thrombus formation), and puncture of an artery; nerve damage and irregular heartbeat and possibility to perform a PICC exchange if needed/ordered by physician.  Alternatives to this procedure were also discussed.  Bard Power PICC patient education guide, fact sheet on infection prevention and patient information card has been provided to patient /or left at bedside.    PICC Placement Documentation  PICC Triple Lumen 06/12/22 Right Brachial 38 cm 0 cm (Active)  Indication for Insertion or Continuance of Line Vasoactive infusions 06/12/22 1849  Exposed Catheter (cm) 0 cm 06/12/22 1849  Site Assessment Clean, Dry, Intact 06/12/22 1849  Lumen #1 Status Flushed;Saline locked;Blood return noted 06/12/22 1849  Lumen #2 Status Flushed;Saline locked;Blood return noted 06/12/22 1849  Lumen #3 Status Flushed;Saline locked;Blood return noted 06/12/22 1849  Dressing Type Transparent;Securing device 06/12/22 1849  Dressing Status Antimicrobial disc in place;Clean, Dry, Intact 06/12/22 1849  Safety Lock Not Applicable 07/15/81 5003  Line Adjustment (NICU/IV Team Only) No 06/12/22 1849  Dressing Intervention Other (Comment) 06/12/22 1849  Dressing Change Due 06/19/22 06/12/22 1849    Patient's mother, Sharmon Leyden, signed PICC consent.   Enos Fling 06/12/2022, 6:50 PM

## 2022-06-12 NOTE — Progress Notes (Signed)
[facility] ADULT ICU REPLACEMENT PROTOCOL   The patient does apply for the [facility] Adult ICU Electrolyte Replacment Protocol based on the criteria listed below:   1.Exclusion criteria: TCTS, ECMO, Dialysis, and Myasthenia Gravis patients 2. Is GFR >/= 30 ml/min? Yes.    Patient's GFR today is >60 3. Is SCr </= 2? Yes.   Patient's SCr is 0.75 mg/dL 4. Did SCr increase >/= 0.5 in 24 hours? No. 5.Pt's weight >40kg  Yes.   6. Abnormal electrolyte(s): K+ 3.7  7. Electrolytes replaced per protocol 8.  Call MD STAT for K+ </= 2.5, Phos </= 1, or Mag </= 1 Physician:  Theador Hawthorne 06/12/2022 6:39 AM

## 2022-06-12 NOTE — Evaluation (Signed)
Occupational Therapy Evaluation Patient Details Name: Deanna Mcmillan MRN: [id number] DOB: July 05, 1971 Today's Date: 06/12/2022   History of Present Illness 51 yo female admitted 11/7 after unable to arouse at home. Intubated 11/7. 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 PMhx: ETOH hepatitis   Clinical Impression   PTA, pt lives alone and typically independent in all daily tasks including full time work. Pt presents now intubated and lethargic. Pt demo some slight responses to external stimuli (eye flickering, eyebrow furrowing) and PROM though inconsistent. Pt with low tolerance to chair position attempts due to orthostasis (see PT note for BP readings). Pt's mother present, supportive - encouraged continued efforts to awaken pt and normalize sleep/wake cycle. Based on current deficits, rec consideration of LTACH at DC. Will continue to follow acutely.     Recommendations for follow up therapy are one component of a multi-disciplinary discharge planning process, led by the attending physician.  Recommendations may be updated based on patient status, additional functional criteria and insurance authorization.   Follow Up Recommendations  OT at Long-term acute care hospital     Assistance Recommended at Discharge Frequent or constant Supervision/Assistance  Patient can return home with the following Two people to help with walking and/or transfers;Two people to help with bathing/dressing/bathroom    Functional Status Assessment  Patient has had a recent decline in their functional status and demonstrates the ability to make significant improvements in function in a reasonable and predictable amount of time.  Equipment Recommendations  Other (comment) (TBD pending progress)    Recommendations for Other Services       Precautions / Restrictions Precautions Precautions: Fall;Other (comment) Precaution Comments: vent, ETT, cortrak, watch BP Restrictions Weight Bearing Restrictions: No       Mobility Bed Mobility Overal bed mobility: Needs Assistance             General bed mobility comments: total assist. Bed egress to chair position. Required return to supine (HOB 30 degrees) due to +orthostatis    Transfers                          Balance                                           ADL either performed or assessed with clinical judgement   ADL Overall ADL's : Needs assistance/impaired                                       General ADL Comments: Total A for all ADLs     Vision Ability to See in Adequate Light:  (to be further assessed) Patient Visual Report: Other (comment) (to be further assessed) Vision Assessment?: No apparent visual deficits     Perception     Praxis      Pertinent Vitals/Pain Pain Assessment Pain Assessment: Faces Faces Pain Scale: No hurt     Hand Dominance Right   Extremity/Trunk Assessment Upper Extremity Assessment Upper Extremity Assessment: RUE deficits/detail;LUE deficits/detail RUE Deficits / Details: PROM WFL for shoulder, elbow and wrist with edema and UE elevated end of session. did demo slight bending of elbow to command though other R UE movement likely automatic responses LUE Deficits / Details: PROM WFL for shoulder, elbow and wrist with edema and  UE elevated end of session   Lower Extremity Assessment Lower Extremity Assessment: Defer to PT evaluation   Cervical / Trunk Assessment Cervical / Trunk Assessment: Normal   Communication Communication Communication: Other (comment) (intubated)   Cognition Arousal/Alertness: Lethargic Behavior During Therapy: Flat affect Overall Cognitive Status: Difficult to assess                                 General Comments: Very lethargic. No eye opening this session. Facial grimacing during PROM.     General Comments  BP 123/58 prior to session. After 1 minute with bed in chair position, pt with  shaking/tremulous movements BUE and trunk. BP 102/33. Returned to [facility] at 30 degress. Shaking ceased and BP 127/65    Exercises     Shoulder Instructions      Home Living Family/patient expects to be discharged to:: Private residence Living Arrangements: Alone Available Help at Discharge: Family;Available 24 hours/day Type of Home: House Home Access: Stairs to enter [employer] of Steps: 2 Entrance Stairs-Rails: None Home Layout: Able to live on main level with bedroom/bathroom;Two level;1/2 bath on main level;Bed/bath upstairs Alternate Level Stairs-Number of Steps: 14   Bathroom Shower/Tub: Walk-in Psychologist, prison and probation services: Handicapped height     Home Equipment: Conservation officer, nature (2 wheels);Cane - single point;Shower seat          Prior Functioning/Environment Prior Level of Function : Independent/Modified Independent;Working/employed;Driving               ADLs Comments: works in IT trainer Problem List: Decreased strength;Decreased activity tolerance;Impaired balance (sitting and/or standing)      OT Treatment/Interventions: Self-care/ADL training;Therapeutic exercise;Energy conservation;DME and/or AE instruction;Therapeutic activities;Patient/family education;Balance training    OT Goals(Current goals can be found in the care plan section) Acute Rehab OT Goals Patient Stated Goal: for pt to wake up, get better OT Goal Formulation: With family Time For Goal Achievement: 06/26/22 Potential to Achieve Goals: Fair  OT Frequency: Min 2X/week    Co-evaluation              AM-PAC OT "6 Clicks" Daily Activity     Outcome Measure Help from another person eating meals?: Total Help from another person taking care of personal grooming?: Total Help from another person toileting, which includes using toliet, bedpan, or urinal?: Total Help from another person bathing (including washing, rinsing, drying)?: Total Help from  another person to put on and taking off regular upper body clothing?: Total Help from another person to put on and taking off regular lower body clothing?: Total 6 Click Score: 6   End of Session Nurse Communication: Mobility status  Activity Tolerance: Patient limited by lethargy Patient left: in bed;with call bell/phone within reach;with family/visitor present  OT Visit Diagnosis: Muscle weakness (generalized) (M62.81);Other abnormalities of gait and mobility (R26.89)                Time: 5361-4431 OT Time Calculation (min): 16 min Charges:  OT General Charges $OT Visit: 1 Visit OT Evaluation $OT Eval High Complexity: 1 High  Deanna Mcmillan, OTR/L Acute Rehab Services Office: [phone]   Deanna Mcmillan 06/12/2022, 9:00 AM

## 2022-06-12 NOTE — Progress Notes (Signed)
Nutrition Follow-up  DOCUMENTATION CODES:  Not applicable  INTERVENTION:  Continue tube feeding cortrak tube Vital AF at 60 ml/h (1440 ml per day) Provides 1728 kcal, 108gm protein, 1168 ml free water daily Thiamine and folic acid daily for hx of EtOH abuse Added end date for zinc after 14 days of administration to avoid copper deficiencies  NUTRITION DIAGNOSIS:  Inadequate oral intake related to inability to eat as evidenced by NPO status. - remains applicable  GOAL:  Patient will meet greater than or equal to 90% of their needs - progressing, being met with TF  MONITOR:  Vent status, Labs, TF tolerance  REASON FOR ASSESSMENT:  Ventilator    ASSESSMENT:  Pt with hx of EtOH abuse with cirrhosis presented to ED after being found unresponsive.   11/7 - intubated and admitted to ICU   Pt remains on the vent at this time. Discussed in rounds. Tolerating TF at goal. No problems with feeds. MD reports profound weakness and secretions being a barrier to extubation. No changes to nutrition plan at this time.   MV: 8.8 L/min Temp (24hrs), Avg:99.1 F (37.3 C), Min:98.4 F (36.9 C), Max:100 F (37.8 C)   Intake/Output Summary (Last 24 hours) at 06/12/2022 0924 Last data filed at 06/12/2022 0700 Gross per 24 hour  Intake 1675.86 ml  Output 2400 ml  Net -724.14 ml  Net IO Since Admission: -197.13 mL [06/12/22 0924]   Nutritionally Relevant Medications: Scheduled Meds:  famotidine  20 mg Per Tube BID   folic acid  1 mg Per Tube Daily   furosemide  80 mg Intravenous Q6H   insulin aspart  0-15 Units Subcutaneous Q4H   insulin aspart  2 Units Subcutaneous Q4H   lactulose  10 g Per Tube TID   phosphorus  250 mg Per Tube Daily   potassium chloride  40 mEq Per Tube BID   rifaximin  550 mg Per Tube BID   spironolactone  50 mg Per Tube Daily   thiamine  100 mg Per Tube Daily   zinc sulfate  220 mg Per Tube Daily   Continuous Infusions:  ampicillin (OMNIPEN) IV 2 g  (06/12/22 0728)   feeding supplement (VITAL AF 1.2 CAL) 60 mL/hr at 06/12/22 0700   gentamicin 20 mg (06/12/22 0832)   potassium chloride 10 mEq (06/12/22 0831)   PRN Meds: polyethylene glycol  Labs Reviewed: BUN 42 CBG ranges from 121-171 mg/dL over the last 24 hours  Lab Results  Component Value Date   ALT 33 06/12/2022   AST 103 (H) 06/12/2022   ALKPHOS 124 06/12/2022   BILITOT 2.7 (H) 06/12/2022    NUTRITION - FOCUSED PHYSICAL EXAM: Flowsheet Row Most Recent Value  Orbital Region No depletion  Upper Arm Region No depletion  Thoracic and Lumbar Region No depletion  Buccal Region No depletion  Temple Region No depletion  Clavicle Bone Region No depletion  Clavicle and Acromion Bone Region No depletion  Scapular Bone Region No depletion  Dorsal Hand No depletion  Patellar Region No depletion  Anterior Thigh Region No depletion  Posterior Calf Region No depletion  Edema (RD Assessment) Moderate  [pitting to BLE and BUE]  Hair Reviewed  Eyes Reviewed  Mouth Reviewed  Skin Reviewed  Nails Reviewed   Diet Order:   Diet Order             Diet NPO time specified  Diet effective now  EDUCATION NEEDS:  Not appropriate for education at this time  Skin:  Skin Assessment: Reviewed RN Assessment  Last BM:  11/21 - type 7  Height:  Ht Readings from Last 1 Encounters:  05/10/22 _0  (1.626 m)   Weight:  Wt Readings from Last 1 Encounters:  06/11/22 73.6 kg    Ideal Body Weight:  54.5 kg  BMI:  Body mass index is 27.85 kg/m.  Estimated Nutritional Needs:  Kcal:  1800-2000 kcal/d Protein:  90-110 g/d Fluid:  2L/d   Ranell Patrick, RD, LDN Clinical Dietitian RD pager # available in [place]  After hours/weekend pager # available in [facility]

## 2022-06-12 NOTE — Progress Notes (Signed)
Pharmacy Antibiotic Note  Deanna Mcmillan is a 51 y.o. female admitted on 06/18/2022 with bacteremia/meningitis. Blood cultures from 11/7 showing MSSA, MSSE and listeria in 1/2 sets. Also with listeria meningitis. Repeat blood cultures 11/8 no growth at 5 days. Pharmacy has been consulted for gentamicin dosing. SCr- 0.75 with CrCl ~ 81 ml/min.   Patient has been receiving gentamicin at 20 mg Q12 hours after previous supratherapeutic levels. Gent peak 11/20 2053 @ 1.4. Gent trough 11/21 0802 <0.5. Gentamicin levels subtherapeutic requiring dose adjustment. WBC elevated at 11 and afebrile in past 24 hours. Continue on same antibiotic regimen for now.   Plan:  Ampicillin 2 gm IV every 4 hours  Cefazolin 2 gm IV every 8 hours Increase to gentamicin 60 mg Q 24 hours (Predicted peak 3.5 (Goal 3-4) and trough 0.4 (Goal < 1) )   Monitor renal function /cultures/clinical status  Obtain gentamicin peaks and troughs as clinically appropriate  Narrow abx as able and f/u with appropriate duration     Weight: 73.6 kg (162 lb 4.1 oz)  Temp (24hrs), Avg:99.1 F (37.3 C), Min:98.4 F (36.9 C), Max:100 F (37.8 C)  Recent Labs  Lab 06/07/22 0320 06/07/22 2040 06/08/22 0518 06/10/22 0425 06/11/22 0336 06/11/22 2053 06/12/22 0541 06/12/22 0802  WBC 11.3*  --  10.7* 10.3 9.6  --  11.0*  --   CREATININE 0.74  --  0.68 0.58 0.61  --  0.75  --   GENTTROUGH  --   --  1.1  --   --   --   --  <0.5*  GENTPEAK  --  6.3  --   --   --  1.4*  --   --      Estimated Creatinine Clearance: 80.9 mL/min (by C-G formula based on SCr of 0.75 mg/dL).    No Known Allergies  Antimicrobials this admission: vancomycin 11/7 >> 11/11 Cefepime 11/7 >> 11/8 Ceftriaxone 11/8 >11/11 Gent 11/8> Ampicillin 11/8> Cefazolin 11/11>  Dose adjustments this admission:  11/10: Gent peak : 6.9, Gent trough: 4.3 >> adjusted to 80 mg q12h 11/14: Gent peak: 5.2, Gent trough: 1.6 >> adjusted to 40 mg q12h  11/17: Gent peak:  6.3, Gent trough: 1.1 >> adjusted to 20 mg q12h 11/20-11/21 GP/GT 1.4/<0.5 >> inc to 60 mg q24h   Thank you for allowing pharmacy to be a part of this patient's care.  Jeneen Rinks, Pharm.D PGY1 Pharmacy Resident 06/12/2022 11:39 AM

## 2022-06-12 NOTE — Progress Notes (Signed)
Deanna Mcmillan is a 51 y/o woman with a history of cirrhosis who presented with encephalopathy and listeria meningitis, bacteremia and MSSA bacteremia. Remains confused.  BP 120/65   Pulse (!) 106   Temp 98.4 F (36.9 C) (Axillary)   Resp (!) 26   Wt 73.6 kg   SpO2 99%   BMI 27.85 kg/m   Critically ill appearing woman lying in bed intubated, sleeping, off sedation [place]/AT, ETT Rhonchi improved with suctioning, thin secretions. Only tolerating PS 12. S1S2, tachycardic Abd soft, NT  Edema improving, petechiae on arms Confused, not following commands, but moving arms spontaneously. Jaundiced.   BUN 42 Cr 0.45 WBC 11 H/H 8.9/26.8  Assessmnet & plan: Sepsis due to MSSA bacteremia, Listeria bacteremia & meningitis -cefazolin, gent, ampicillin per ID; appreciate their recommendations -needs TEE after extubation  Cirrhosis with HE -rifaximin, lactulose -spironolactone, lasix -zinc -supportive care -keep off sedation as able -correct electrolytes  Acute respiratory failure with hypoxia requiring MV -LTVV -VAP prevention protocol -PAD protocol for sedation -Daily SAT & SBT; con't aggressive weaning efforts. May need trach if not able to extubate soon. -con't aggressive diuresis- adding back metolazone today -failed SBT on PS 10 today; anticipate she will need trach in the future 2/2 NM weakness  Profound deconditioning -PT, OT -anticipate she will need trach  Hyperglycemia -TF coverage, SSI PRN -goal BG 140-180  Anemia -transfuse for Hb <7 or hemodynamically significant bleeding -monitor  H/o ETOH abuse -vitamins -recommend cessation  I had an extensive discussion with her mother today about her overall care and the likely months of recovery ahead of her. She was surprised to hear how long recovery after critical care often is. Palliative care consulted to help clarify goals and provide support.   This patient is critically ill with multiple organ system failure  which requires frequent high complexity decision making, assessment, support, evaluation, and titration of therapies. This was completed through the application of advanced monitoring technologies and extensive interpretation of multiple databases. During this encounter critical care time was devoted to patient care services described in this note for 50 minutes.  Deanna Hy, DO 06/12/22 11:31 AM  Pulmonary & Critical Care  For contact information, see Amion. If no response to pager, please call PCCM consult pager. After hours, 7PM- 7AM, please call Elink.

## 2022-06-12 NOTE — Progress Notes (Signed)
[facility] for Infectious Disease    Date of Admission:  06/12/2022   Total days of antibiotics14      ID: Deanna Mcmillan is a 51 y.o. female with   Principal Problem:   Acute respiratory failure with hypoxia (HCC) Active Problems:   Pressure injury of skin   Toxic metabolic encephalopathy   On mechanically assisted ventilation (HCC)   Sepsis with acute hypoxic respiratory failure (HCC)   Listeria meningitis   Acute pulmonary edema (HCC)    Subjective: Afebrile, family member reports she tolerated vent wean, and also more alert in the afternoon. This morning still slow to respond to opening eyes to verbal stimuli  Medications:   Chlorhexidine Gluconate Cloth  6 each Topical Daily   famotidine  20 mg Per Tube BID   folic acid  1 mg Per Tube Daily   furosemide  80 mg Intravenous Q6H   heparin  5,000 Units Subcutaneous Q8H   insulin aspart  0-15 Units Subcutaneous Q4H   insulin aspart  2 Units Subcutaneous Q4H   lactulose  10 g Per Tube TID   mouth rinse  15 mL Mouth Rinse Q2H   phosphorus  250 mg Per Tube Daily   potassium chloride  40 mEq Per Tube BID   rifaximin  550 mg Per Tube BID   spironolactone  50 mg Per Tube Daily   thiamine  100 mg Per Tube Daily   zinc sulfate  220 mg Per Tube Daily    Objective: Vital signs in last 24 hours: Temp:  [98.4 F (36.9 C)-100 F (37.8 C)] 98.4 F (36.9 C) (11/21 1125) Pulse Rate:  [85-106] 103 (11/21 1138) Resp:  [14-28] 22 (11/21 1138) BP: (110-131)/(54-65) 131/55 (11/21 1138) SpO2:  [95 %-99 %] 97 % (11/21 1138) FiO2 (%):  [40 %] 40 % (11/21 1138)  Physical Exam  Constitutional:  oriented to person, place, and time. appears well-developed and well-nourished. No distress.  HENT: [place]/AT, PERRLA, no scleral icterus Mouth/Throat: OETT in place Cardiovascular: Normal rate, regular rhythm and normal heart sounds. Exam reveals no gallop and no friction rub.  No murmur heard.  Pulmonary/Chest: Effort normal and breath  sounds normal. No respiratory distress.  has no wheezes.  Neck = supple, no nuchal rigidity Abdominal: Soft. Bowel sounds are normal.  exhibits no distension. There is no tenderness.  Lymphadenopathy: no cervical adenopathy. No axillary adenopathy Ext: trace pitting edema Skin: Skin is warm and dry. No rash noted. No erythema.    Lab Results Recent Labs    06/11/22 0336 06/12/22 0541  WBC 9.6 11.0*  HGB 8.7* 8.9*  HCT 27.7* 26.8*  NA 144 145  K 3.9 3.7  CL 105 105  CO2 27 26  BUN 35* 42*  CREATININE 0.61 0.75   Liver Panel Recent Labs    06/12/22 0541  PROT 6.5  ALBUMIN 2.7*  AST 103*  ALT 33  ALKPHOS 124  BILITOT 2.7*    Microbiology: reviewed Studies/Results: [place] EKG SITE RITE  Result Date: 06/12/2022 If Site Rite image not attached, placement could not be confirmed due to current cardiac rhythm.    Assessment/Plan: Listeria meningitis with secondary bacteremia = continue with amp/gent-today is day 14. Anticipate to continue for a few more days until extubation. Concern that she may get ototoxicity from gent if goes to 3 weeks.  Mssa bacteremia = continue with cefazolin  Respiratory distress = continues to wean. Toleralting PS  Encephalopathy in setting of cirrhosis =  continue with rifaximin and diuretics for management of cirrhosis  [facility] for Infectious Diseases Pager: [phone]  06/12/2022, 3:15 PM

## 2022-06-13 DIAGNOSIS — J9601 Acute respiratory failure with hypoxia: Secondary | ICD-10-CM | POA: Diagnosis not present

## 2022-06-13 DIAGNOSIS — Z9911 Dependence on respirator [ventilator] status: Secondary | ICD-10-CM | POA: Diagnosis not present

## 2022-06-13 DIAGNOSIS — K7682 Hepatic encephalopathy: Secondary | ICD-10-CM

## 2022-06-13 LAB — CBC WITH DIFFERENTIAL/PLATELET
Abs Immature Granulocytes: 0.1 10*3/uL — ABNORMAL HIGH (ref 0.00–0.07)
Basophils Absolute: 0 10*3/uL (ref 0.0–0.1)
Basophils Relative: 0 %
Eosinophils Absolute: 0.1 10*3/uL (ref 0.0–0.5)
Eosinophils Relative: 1 %
HCT: 29.2 % — ABNORMAL LOW (ref 36.0–46.0)
Hemoglobin: 8.9 g/dL — ABNORMAL LOW (ref 12.0–15.0)
Immature Granulocytes: 1 %
Lymphocytes Relative: 14 %
Lymphs Abs: 1.3 10*3/uL (ref 0.7–4.0)
MCH: 36.6 pg — ABNORMAL HIGH (ref 26.0–34.0)
MCHC: 30.5 g/dL (ref 30.0–36.0)
MCV: 120.2 fL — ABNORMAL HIGH (ref 80.0–100.0)
Monocytes Absolute: 1.1 10*3/uL — ABNORMAL HIGH (ref 0.1–1.0)
Monocytes Relative: 12 %
Neutro Abs: 6.9 10*3/uL (ref 1.7–7.7)
Neutrophils Relative %: 72 %
Platelets: 229 10*3/uL (ref 150–400)
RBC: 2.43 MIL/uL — ABNORMAL LOW (ref 3.87–5.11)
RDW: 17.8 % — ABNORMAL HIGH (ref 11.5–15.5)
WBC: 9.5 10*3/uL (ref 4.0–10.5)
nRBC: 0 % (ref 0.0–0.2)

## 2022-06-13 LAB — COMPREHENSIVE METABOLIC PANEL
ALT: 23 U/L (ref 0–44)
AST: 72 U/L — ABNORMAL HIGH (ref 15–41)
Albumin: 2.6 g/dL — ABNORMAL LOW (ref 3.5–5.0)
Alkaline Phosphatase: 111 U/L (ref 38–126)
Anion gap: 12 (ref 5–15)
BUN: 47 mg/dL — ABNORMAL HIGH (ref 6–20)
CO2: 26 mmol/L (ref 22–32)
Calcium: 8.4 mg/dL — ABNORMAL LOW (ref 8.9–10.3)
Chloride: 109 mmol/L (ref 98–111)
Creatinine, Ser: 0.7 mg/dL (ref 0.44–1.00)
GFR, Estimated: >60 mL/min (ref >60)
Glucose, Bld: 191 mg/dL — ABNORMAL HIGH (ref 70–99)
Potassium: 3.2 mmol/L — ABNORMAL LOW (ref 3.5–5.1)
Sodium: 147 mmol/L — ABNORMAL HIGH (ref 135–145)
Total Bilirubin: 2.7 mg/dL — ABNORMAL HIGH (ref 0.3–1.2)
Total Protein: 6.5 g/dL (ref 6.5–8.1)

## 2022-06-13 LAB — GLUCOSE, CAPILLARY
Glucose-Capillary: 135 mg/dL — ABNORMAL HIGH (ref 70–99)
Glucose-Capillary: 156 mg/dL — ABNORMAL HIGH (ref 70–99)
Glucose-Capillary: 157 mg/dL — ABNORMAL HIGH (ref 70–99)
Glucose-Capillary: 164 mg/dL — ABNORMAL HIGH (ref 70–99)
Glucose-Capillary: 177 mg/dL — ABNORMAL HIGH (ref 70–99)
Glucose-Capillary: 181 mg/dL — ABNORMAL HIGH (ref 70–99)

## 2022-06-13 MED ORDER — POTASSIUM CHLORIDE 10 MEQ/50ML IV SOLN
10.0000 meq | INTRAVENOUS | Status: AC
  Administered 2022-06-13 (×3): 10 meq via INTRAVENOUS
  Filled 2022-06-13 (×4): qty 50

## 2022-06-13 MED ORDER — METOLAZONE 5 MG PO TABS
5.0000 mg | ORAL_TABLET | Freq: Once | ORAL | Status: AC
  Administered 2022-06-13: 5 mg
  Filled 2022-06-13: qty 2
  Filled 2022-06-13: qty 1

## 2022-06-13 MED ORDER — POTASSIUM CHLORIDE 10 MEQ/50ML IV SOLN
10.0000 meq | INTRAVENOUS | Status: AC
  Administered 2022-06-13 (×4): 10 meq via INTRAVENOUS
  Filled 2022-06-13 (×4): qty 50

## 2022-06-13 MED ORDER — POTASSIUM CHLORIDE 20 MEQ PO PACK
20.0000 meq | PACK | ORAL | Status: AC
  Administered 2022-06-13 (×2): 20 meq
  Filled 2022-06-13 (×2): qty 1

## 2022-06-13 MED ORDER — WHITE PETROLATUM EX OINT
TOPICAL_OINTMENT | CUTANEOUS | Status: DC | PRN
  Administered 2022-06-13: 1 via TOPICAL
  Filled 2022-06-13 (×2): qty 28.35

## 2022-06-13 NOTE — Progress Notes (Signed)
[facility] for Infectious Disease    Date of Admission:  06/18/2022             ID: Deanna Mcmillan is a 51 y.o. 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 Continues on vent trials  Medications:   Chlorhexidine Gluconate Cloth  6 each Topical Daily   famotidine  20 mg Per Tube BID   folic acid  1 mg Per Tube Daily   furosemide  80 mg Intravenous Q6H   heparin  5,000 Units Subcutaneous Q8H   insulin aspart  0-15 Units Subcutaneous Q4H   insulin aspart  2 Units Subcutaneous Q4H   lactulose  10 g Per Tube TID   mouth rinse  15 mL Mouth Rinse Q2H   phosphorus  250 mg Per Tube Daily   potassium chloride  20 mEq Per Tube Q4H   potassium chloride  40 mEq Per Tube BID   rifaximin  550 mg Per Tube BID   sodium chloride flush  10-40 mL Intracatheter Q12H   spironolactone  50 mg Per Tube Daily   thiamine  100 mg Per Tube Daily   zinc sulfate  220 mg Per Tube Daily    Objective: Vital signs in last 24 hours: Temp:  [98.3 F (36.8 C)-98.7 F (37.1 C)] 98.4 F (36.9 C) (11/22 0750) Pulse Rate:  [81-104] 88 (11/22 0700) Resp:  [16-40] 21 (11/22 0700) BP: (100-134)/(49-68) 121/65 (11/22 0700) SpO2:  [96 %-98 %] 97 % (11/22 0700) FiO2 (%):  [40 %] 40 % (11/22 0742)  Physical Exam  Constitutional:  oriented to person. appears well-developed and well-nourished. No distress.  HENT: [place]/AT, PERRLA, no scleral icterus Mouth/Throat: OETT in place Cardiovascular: Normal rate, regular rhythm and normal heart sounds. Exam reveals no gallop and no friction rub.   No murmur heard.  Pulmonary/Chest: Effort normal and breath sounds normal. No respiratory distress.  has no wheezes.  Neck = supple, no nuchal rigidity Abdominal: Soft. Bowel sounds are normal.  exhibits no distension. There is no tenderness.  Lymphadenopathy: no cervical adenopathy. No axillary adenopathy Neurological: alert and oriented to person, place, and time.  Skin: Skin is warm and dry. Scattered echymosis to arms.     Lab Results Recent Labs    06/12/22 0541 06/13/22 0450  WBC 11.0* 9.5  HGB 8.9* 8.9*  HCT 26.8* 29.2*  NA 145 147*  K 3.7 3.2*  CL 105 109  CO2 26 26  BUN 42* 47*  CREATININE 0.75 0.70   Liver Panel Recent Labs    06/12/22 0541 06/13/22 0450  PROT 6.5 6.5  ALBUMIN 2.7* 2.6*  AST 103* 72*  ALT 33 23  ALKPHOS 124 111  BILITOT 2.7* 2.7*   Sedimentation Rate No results for input(s): "ESRSEDRATE" in the last 72 hours. C-Reactive Protein No results for input(s): "CRP" in the last 72 hours.  Microbiology: reviewed Studies/Results: [place] EKG SITE RITE  Result Date: 06/12/2022 If Site Rite image not attached, placement could not be confirmed due to current cardiac rhythm.    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 Plan to continue gent through 11/24. Then continue ampicillin for additional week.  MSSA/MSSE bacteremia = has completed 14 days of therapy as of today. Plan to stop cefazolin after tomorrow  Respiratory failure on vent =undergoing weaning trial but still is diffusely weak and may ultimately need trach per PCCM  Cirrhosis = continue on rifaxamin/lactulose to prevent HE and management for ascites with lasix/spironolactone  Chronic anemia = likely from hospitalization and underlying liver disease. Continue to monitor and transfuse if hgb <7.   Dr Prudencio Pair available for questions over the next 4 days   [facility] for Infectious  Diseases Pager: [phone]  06/13/2022, 10:27 AM

## 2022-06-13 NOTE — Progress Notes (Signed)
[facility] ADULT ICU REPLACEMENT PROTOCOL   The patient does apply for the [facility] Adult ICU Electrolyte Replacment Protocol based on the criteria listed below:   1.Exclusion criteria: TCTS, ECMO, Dialysis, and Myasthenia Gravis patients 2. Is GFR >/= 30 ml/min? Yes.    Patient's GFR today is >60 3. Is SCr </= 2? Yes.   Patient's SCr is 0.70 mg/dL 4. Did SCr increase >/= 0.5 in 24 hours? No. 5.Pt's weight >40kg  Yes.   6. Abnormal electrolyte(s): K+ 3.2  7. Electrolytes replaced per protocol 8.  Call MD STAT for K+ </= 2.5, Phos </= 1, or Mag </= 1 Physician:  n/a  Darlys Gales 06/13/2022 6:21 AM

## 2022-06-13 NOTE — Progress Notes (Signed)
Deanna Mcmillan is a 51 y/o woman with a history of cirrhosis who presented with encephalopathy and listeria meningitis, bacteremia and MSSA bacteremia. Failed SBT on PS 10 yesterday within minutes per RT. Denies complaints today.  BP 121/65   Pulse 88   Temp 98.4 F (36.9 C) (Oral)   Resp (!) 21   Wt 73.6 kg   SpO2 97%   BMI 27.85 kg/m  Chronically ill appearing woman, more awake today [place]/AT, eyes anicteric Breathing comfortably on PS 8/CPAP 5. Can exhale 900cc with forced exhalation.  S1S2, RRR Abd soft, NT, mildly distended but tympanitic Improving peripheral edema, no cyanosis Skin warm, dry. Petechiae diffusely Globally weak, but moving all extremities, trying to follow commands. Can lift her head off the pillow. Maintaining alertness longer today.  BUN 47 Cr 0.7 WBC 9.5 H/H 8.9/29.2  Assessmnet & plan: Sepsis due to MSSA bacteremia, Listeria bacteremia & meningitis -Cefazolin, gent, ampicillin per ID; appreciate their recommendations> needs 3 weeks of coverage minimum -Needs TEE after extubation> could not be completed while intubated due to mouth crowding. Cardiology aware of her.  Cirrhosis with HE -rifaximin, lactulose -con't spirononactone, lasix  -zinc  -remain off sedation, con't supportive care  -correct electrolytes to facilitate diuresis  Acute respiratory failure with hypoxia requiring MV -LTVV -VAP prevention protocol -PAD protocol -daily SAT & SBT> passed, extubation today. Would want reintubation and trach if she fails.   Profound deconditioning -PT, OT -still worry about needing about potentially needing a trach  Hyperglycemia -TF coverage, 2 units Q4h -goal BG 140-180  Hypokalemia -replete -monitor  Anemia -transfuse for Hb <7 or hemodynamically significant bleeding -monitor  H/o ETOH abuse -recommend total cessation -vitamins  The patient was able to indicate she would want a trach for prolonged time for recovery if she fails extubation.   She and her mother are aware she is in for a months-long recovery process.  This patient is critically ill with multiple organ system failure which requires frequent high complexity decision making, assessment, support, evaluation, and titration of therapies. This was completed through the application of advanced monitoring technologies and extensive interpretation of multiple databases. During this encounter critical care time was devoted to patient care services described in this note for 40 minutes.  Julian Hy, DO 06/13/22 1:35 PM [facility]  For contact information, see Amion. If no response to pager, please call PCCM consult pager. After hours, 7PM- 7AM, please call Elink.

## 2022-06-13 NOTE — Progress Notes (Signed)
NAME:  Deanna Mcmillan, MRN:  [id number], DOB:  [birth year], LOS: 28 ADMISSION DATE:  06/14/2022, CONSULTATION DATE:  11/7 REFERRING MD:  Lendon Colonel, EDP CHIEF COMPLAINT:  AMS   History of Present Illness:  51 yo woman with hx of recent diagnosis of alcoholic hepatitis (dx 92/42), here with ams, fever.   History from her mother Deanna Mcmillan.  Patient was in her normal state of health, until suddenly the night PTA she developed a headache.  She went to sleep to rest.  The following day she slept throughout the day, that evening her mother went to wake her and was unable to wake her up, so she called 911.     She has been doing well since her recent hospital discharge, taking all the meds as prescribed, no new meds.  Planned follow up with GI on 11/14.  LE edema had been improving.  No cough, sob no focal symptoms.  No known sick contacts.     In ED, febrile, tachycardic, lethargic, desaturating to 70s.   Intubated by ED physician.    Had been admitted earlier in October and started on course of prenisone for alc hepatitis.    Recently seen at [facility] 10/19: LE Edema. Lasix not helpful at home.  Admitted for diuresis.  Jaundice and abd distension also noted.  Pleural effusion.  GI saw: non enough ascites for tap  Started lasix and spironolactone.  28 day course of prednisolone, miralax, lactulose, miralax. Midodrine TID    In ED got cefepime, LR 1L, flagyl ordered, intubated, started on Propofol, vanc ordered   Pertinent  Medical History  ETOH Cirrhosis BLE edema recently worsening  S/p lap appy 2014    Echo: recent normal 68/3419   Home meds: folic ascid, acetaminohpen, lasix 40bid, lactulose 30 TID protonix, prednisone (04/29/22), prednisolone 04/28/22 three week course, thiamine, simethicone,    Per her mother she had been drinking at least one bottle of wine daily, she isnt clear exactly how much she was drinking but she did also drink [place].  She notes she had wanted to stop recently but  been unable to.  She thinks she was depressed after experiencing perimenopause.  No tobacco.   Significant Hospital Events: Including procedures, antibiotic start and stop dates in addition to other pertinent events   11/7: Intubated in ED and admitted to ICU 11/7: [place] neg, CT A/P cirrhosis with portal hypertension, splenomegaly, moderate volume ascites, moderate bilateral pleural effusion 11/9 KUB: No ileus 11/10: LP confirms listeria meningitis 11/11 KUB: ileus 11/12 ileus resolved with aggressive bowel regimen 11/15 KUB: No ileus. 11/18 more awake, following commands   Interim History / Subjective:  No acute overnight events. Afebrile overnight.   More responsive this morning. Her eyes were open upon arrival into room. She was able to blink for "yes" in response to questions. Denies any pain. ETT is uncomfortable for her. Continues to have significant neuromuscular weakness in bilateral upper and lower extremities, but able to slightly elevate head from bed. She is tolerating PS 8.  Will attempt extubation today. Discussed with patient and mother that should she fail extubation, she will need to be reintubated and likely transitioned to a trach afterwards. They both understand and are agreeable with this.  Objective   Blood pressure 121/65, pulse 88, temperature 97.6 F (36.4 C), temperature source Axillary, resp. rate (!) 24, weight 73.6 kg, SpO2 97 %.    Vent Mode: Stand-by FiO2 (%):  [40 %] 40 % Set Rate:  [20  bmp] 20 bmp Vt Set:  [420 mL] 420 mL PEEP:  [5 cmH20] 5 cmH20 Pressure Support:  [8 LGX21-19 cmH20] 8 cmH20 Plateau Pressure:  [14 cmH20] 14 cmH20   Intake/Output Summary (Last 24 hours) at 06/13/2022 1239 Last data filed at 06/13/2022 0915 Gross per 24 hour  Intake 2395.1 ml  Output 3750 ml  Net -1354.9 ml   Filed Weights   06/09/22 0500 06/10/22 0424 06/11/22 0500  Weight: 72.7 kg 75.1 kg 73.6 kg   Examination: General: critically ill-appearing middle aged  woman, laying in bed, on mech vent, NAD. HEENT: [place]/AT, ETT in place, eyes open upon arrival into room. CV: normal rate and regular rhythm. Pulm: mechanical lung sounds, no adventitious sounds noted. Tolerating PS 8. Abdomen: soft, mildly distended, nontender. Extremities: 0-1+ pitting edema in bilateral LE with compression stockings in place. Skin: thin skin with bruising/petechiae noted on UE bilaterally. Neuro: alert with eyes open, tracking appropriately, able to follow simple commands and blink "yes" to answer questions. Able to slightly elevate head off bed. Continues to have significant neuromuscular weakness in bilateral upper and lower extremities.      Latest Ref Rng & Units 06/13/2022    4:50 AM 06/12/2022    5:41 AM 06/11/2022    3:36 AM  CBC  WBC 4.0 - 10.5 K/uL 9.5  11.0  9.6   Hemoglobin 12.0 - 15.0 g/dL 8.9  8.9  8.7   Hematocrit 36.0 - 46.0 % 29.2  26.8  27.7   Platelets 150 - 400 K/uL 229  207  176       Latest Ref Rng & Units 06/13/2022    4:50 AM 06/12/2022    5:41 AM 06/11/2022    3:36 AM  CMP  Glucose 70 - 99 mg/dL 191  164  144   BUN 6 - 20 mg/dL 47  42  35   Creatinine 0.44 - 1.00 mg/dL 0.70  0.75  0.61   Sodium 135 - 145 mmol/L 147  145  144   Potassium 3.5 - 5.1 mmol/L 3.2  3.7  3.9   Chloride 98 - 111 mmol/L 109  105  105   CO2 22 - 32 mmol/L 26  26  27    Calcium 8.9 - 10.3 mg/dL 8.4  9.0  9.7   Total Protein 6.5 - 8.1 g/dL 6.5  6.5    Total Bilirubin 0.3 - 1.2 mg/dL 2.7  2.7    Alkaline Phos 38 - 126 U/L 111  124    AST 15 - 41 U/L 72  103    ALT 0 - 44 U/L 23  33       Resolved Hospital Problem list   Ileus Hypokalemia Hypophosphatemia Hypernatremia Thrombocytopenia  Assessment & Plan:   Severe sepsis - resolving  Acute metabolic encephalopathy - improving  Listeria & MSSA bacteremia Listeria meningitis -appreciate ID assistance -currently day 15 of ampicillin and gentamicin  -continue gentamicin through 11/24  -continue  ampicillin through 12/1 -currently day 14 of therapy for MSSA bacteremia, plan to stop cefazolin after tomorrow -given narrow oral cavity and crowding with ETT in place, will await for extubation prior to re-consulting cardiology for TEE   Acute hypoxic respiratory failure - improving CAP versus aspiration pneumonia Compressive atelectasis Tolerating PS 8 today, more alert/awake, and following simple commands. She continues to have significant neuromuscular weakness, likely from acute infection and prolonged ICU stay. She meets criteria for extubation today, but patient and mother understand that if she fails,  will need reintubation and likely transition to trach after. She will need aggressive PT/OT (likely CIR) for strength training after extubation. -extubate -if fails, re-intubate and subsequently transition to trach -antibiotics as above -continue diuresis -aggressive PT/OT  Fluid overloaded - improving Hypokalemia Net -1L overnight. Improving from a volume standpoint, but still not euvolemic as of yet. Will continue with aggressive diuresis through today and reassess volume status tomorrow. Mild hypokalemia on labs from aggressive diuresis, will replete. -lasix 80mg  q6 -dose of metolazone 5mg  again today -replete potassium -check K and Mg tomorrow morning -strict I/O  Alcoholic cirrhosis Hepatic encephalopathy - improving Recently diagnosed cirrhosis during last hospitalization. MELD Na score of 17. Child Pugh Class C. FIB-4 score of 7.46 indicates advance fibrosis. Peritoneal labs not consistent with SBP, however, patient received abx prior to tap. Continue current therapy. -lasix + spironolactone -thiamine, MVI, folate, zinc -lactulose and rifaximin  Hx SVT Sinus tach Prolonged Qtc, improved -monitor electrolytes and replete PRN -monitor on tele  Protein caloric malnutrition, mild Hopefully can transition gradually to PO intake, pending successful extubation. Continue TF  for now. -TF  Hyperglycemia, controlled -TF coverage 2 units q4h -continue SSI prn -goal BG 140-180  Hypernatremia -mild elevation noted on labs today (147) -will check again on CBC tomorrow, if persisting > free water  Mother updated at bedside during rounds.  Best Practice (right click and "Reselect all SmartList Selections" daily)   Diet/type: tubefeeds DVT prophylaxis: prophylactic heparin  GI prophylaxis: PPI Lines: Central line Foley:  Yes, and it is still needed Code Status:  full code Last date of multidisciplinary goals of care discussion [updated mother at bedside 11/22]  Labs   CBC: Recent Labs  Lab 06/08/22 0518 06/10/22 0425 06/11/22 0336 06/12/22 0541 06/13/22 0450  WBC 10.7* 10.3 9.6 11.0* 9.5  NEUTROABS 8.2*  --   --  8.1* 6.9  HGB 8.4* 8.8* 8.7* 8.9* 8.9*  HCT 25.5* 27.0* 27.7* 26.8* 29.2*  MCV 114.9* 116.9* 119.4* 116.0* 120.2*  PLT 125* 155 176 207 878    Basic Metabolic Panel: Recent Labs  Lab 06/07/22 0320 06/08/22 0518 06/10/22 0425 06/11/22 0336 06/12/22 0541 06/13/22 0450  NA 153* 143 142 144 145 147*  K 4.3 3.2* 4.1 3.9 3.7 3.2*  CL 111 105 102 105 105 109  CO2 31 29 27 27 26 26   GLUCOSE 129* 160* 140* 144* 164* 191*  BUN 39* 37* 31* 35* 42* 47*  CREATININE 0.74 0.68 0.58 0.61 0.75 0.70  CALCIUM 10.6* 9.5 9.9 9.7 9.0 8.4*  MG 2.3  --  2.5* 2.1  --   --   PHOS 3.9  --  3.1 3.7  --   --    GFR: Estimated Creatinine Clearance: 80.9 mL/min (by C-G formula based on SCr of 0.7 mg/dL). Recent Labs  Lab 06/10/22 0425 06/11/22 0336 06/12/22 0541 06/13/22 0450  WBC 10.3 9.6 11.0* 9.5      Virl Axe, MD 06/13/2022, 12:39 PM

## 2022-06-13 NOTE — Procedures (Signed)
Extubation Procedure Note  Patient Details:   Name: Deanna Mcmillan DOB: 16-Mar-1971 MRN: [id number]   Airway Documentation:    Vent end date: 06/13/22 Vent end time: 1230   Evaluation  O2 sats: stable throughout Complications: No apparent complications Patient did tolerate procedure well. Bilateral Breath Sounds: Rhonchi   Yes  Leak test positive. No stridor noted.  Geet Hosking 06/13/2022, 12:35 PM

## 2022-06-13 NOTE — Progress Notes (Signed)
Palliative:  Chart reviewed and discussed with physician and bedside RN. Visited patient briefly at bedside, family had stepped out and patient unable to participate in conversation r/t intubation. While awaiting return of family for goals of care conversation patient was extubated and PMT consult canceled.  Please reconsult if any needs arise with which PMT can be of assistance.   Juel Burrow, DNP, AGNP-C Palliative Medicine Team Team Phone # [phone]  Pager # [phone]  NO CHARGE

## 2022-06-14 ENCOUNTER — Inpatient Hospital Stay (HOSPITAL_COMMUNITY): Payer: 59

## 2022-06-14 DIAGNOSIS — Z515 Encounter for palliative care: Secondary | ICD-10-CM

## 2022-06-14 DIAGNOSIS — A329 Listeriosis, unspecified: Secondary | ICD-10-CM | POA: Diagnosis not present

## 2022-06-14 DIAGNOSIS — Z7189 Other specified counseling: Secondary | ICD-10-CM

## 2022-06-14 DIAGNOSIS — A419 Sepsis, unspecified organism: Secondary | ICD-10-CM | POA: Diagnosis not present

## 2022-06-14 DIAGNOSIS — R6521 Severe sepsis with septic shock: Secondary | ICD-10-CM

## 2022-06-14 DIAGNOSIS — J9601 Acute respiratory failure with hypoxia: Secondary | ICD-10-CM | POA: Diagnosis not present

## 2022-06-14 LAB — COMPREHENSIVE METABOLIC PANEL
ALT: 17 U/L (ref 0–44)
AST: 49 U/L — ABNORMAL HIGH (ref 15–41)
Albumin: 2.7 g/dL — ABNORMAL LOW (ref 3.5–5.0)
Alkaline Phosphatase: 105 U/L (ref 38–126)
Anion gap: 15 (ref 5–15)
BUN: 51 mg/dL — ABNORMAL HIGH (ref 6–20)
CO2: 26 mmol/L (ref 22–32)
Calcium: 9.4 mg/dL (ref 8.9–10.3)
Chloride: 110 mmol/L (ref 98–111)
Creatinine, Ser: 0.59 mg/dL (ref 0.44–1.00)
GFR, Estimated: >60 mL/min (ref >60)
Glucose, Bld: 167 mg/dL — ABNORMAL HIGH (ref 70–99)
Potassium: 3.3 mmol/L — ABNORMAL LOW (ref 3.5–5.1)
Sodium: 151 mmol/L — ABNORMAL HIGH (ref 135–145)
Total Bilirubin: 2.2 mg/dL — ABNORMAL HIGH (ref 0.3–1.2)
Total Protein: 6.8 g/dL (ref 6.5–8.1)

## 2022-06-14 LAB — CBC WITH DIFFERENTIAL/PLATELET
Abs Immature Granulocytes: 0.11 10*3/uL — ABNORMAL HIGH (ref 0.00–0.07)
Basophils Absolute: 0 10*3/uL (ref 0.0–0.1)
Basophils Relative: 0 %
Eosinophils Absolute: 0.1 10*3/uL (ref 0.0–0.5)
Eosinophils Relative: 1 %
HCT: 30 % — ABNORMAL LOW (ref 36.0–46.0)
Hemoglobin: 9.1 g/dL — ABNORMAL LOW (ref 12.0–15.0)
Immature Granulocytes: 1 %
Lymphocytes Relative: 12 %
Lymphs Abs: 1.3 10*3/uL (ref 0.7–4.0)
MCH: 36.7 pg — ABNORMAL HIGH (ref 26.0–34.0)
MCHC: 30.3 g/dL (ref 30.0–36.0)
MCV: 121 fL — ABNORMAL HIGH (ref 80.0–100.0)
Monocytes Absolute: 1.1 10*3/uL — ABNORMAL HIGH (ref 0.1–1.0)
Monocytes Relative: 11 %
Neutro Abs: 8.1 10*3/uL — ABNORMAL HIGH (ref 1.7–7.7)
Neutrophils Relative %: 75 %
Platelets: 216 10*3/uL (ref 150–400)
RBC: 2.48 MIL/uL — ABNORMAL LOW (ref 3.87–5.11)
RDW: 17.4 % — ABNORMAL HIGH (ref 11.5–15.5)
WBC: 10.8 10*3/uL — ABNORMAL HIGH (ref 4.0–10.5)
nRBC: 0 % (ref 0.0–0.2)

## 2022-06-14 LAB — MAGNESIUM: Magnesium: 2.6 mg/dL — ABNORMAL HIGH (ref 1.7–2.4)

## 2022-06-14 LAB — GLUCOSE, CAPILLARY
Glucose-Capillary: 148 mg/dL — ABNORMAL HIGH (ref 70–99)
Glucose-Capillary: 125 mg/dL — ABNORMAL HIGH (ref 70–99)
Glucose-Capillary: 141 mg/dL — ABNORMAL HIGH (ref 70–99)
Glucose-Capillary: 137 mg/dL — ABNORMAL HIGH (ref 70–99)
Glucose-Capillary: 146 mg/dL — ABNORMAL HIGH (ref 70–99)
Glucose-Capillary: 139 mg/dL — ABNORMAL HIGH (ref 70–99)

## 2022-06-14 LAB — LACTIC ACID, PLASMA: Lactic Acid, Venous: 1.3 mmol/L (ref 0.5–1.9)

## 2022-06-14 LAB — AMYLASE: Amylase: 64 U/L (ref 28–100)

## 2022-06-14 LAB — LIPASE, BLOOD: Lipase: 73 U/L — ABNORMAL HIGH (ref 11–51)

## 2022-06-14 LAB — AMMONIA: Ammonia: 72 umol/L — ABNORMAL HIGH (ref 9–35)

## 2022-06-14 MED ORDER — SODIUM CHLORIDE 0.45 % IV BOLUS
500.0000 mL | Freq: Once | INTRAVENOUS | Status: AC
  Administered 2022-06-14: 500 mL via INTRAVENOUS

## 2022-06-14 MED ORDER — METOCLOPRAMIDE HCL 5 MG/5ML PO SOLN
10.0000 mg | Freq: Four times a day (QID) | ORAL | Status: DC | PRN
  Administered 2022-06-14: 10 mg
  Filled 2022-06-14 (×2): qty 10

## 2022-06-14 MED ORDER — DEXTROSE 5 % IV SOLN: INTRAVENOUS | Status: AC

## 2022-06-14 MED ORDER — MIDODRINE HCL 5 MG PO TABS
5.0000 mg | ORAL_TABLET | Freq: Three times a day (TID) | ORAL | Status: DC
  Administered 2022-06-14 – 2022-06-15 (×4): 5 mg
  Filled 2022-06-14 (×4): qty 1

## 2022-06-14 MED ORDER — LACTATED RINGERS IV BOLUS: 250.0000 mL | Freq: Once | INTRAVENOUS | Status: DC

## 2022-06-14 MED ORDER — NOREPINEPHRINE 4 MG/250ML-% IV SOLN
0.0000 ug/min | INTRAVENOUS | Status: DC
  Administered 2022-06-14: 2 ug/min via INTRAVENOUS
  Administered 2022-06-15: 1 ug/min via INTRAVENOUS
  Filled 2022-06-14 (×2): qty 250

## 2022-06-14 MED ORDER — ORAL CARE MOUTH RINSE: 15.0000 mL | OROMUCOSAL | Status: DC | PRN

## 2022-06-14 MED ORDER — LACTULOSE 10 GM/15ML PO SOLN
30.0000 g | Freq: Three times a day (TID) | ORAL | Status: DC
  Administered 2022-06-14 – 2022-06-15 (×3): 30 g
  Filled 2022-06-14 (×3): qty 45

## 2022-06-14 MED ORDER — POTASSIUM CHLORIDE 20 MEQ PO PACK
20.0000 meq | PACK | ORAL | Status: AC
  Administered 2022-06-14 (×2): 20 meq
  Filled 2022-06-14 (×2): qty 1

## 2022-06-14 MED ORDER — ORAL CARE MOUTH RINSE
15.0000 mL | OROMUCOSAL | Status: DC
  Administered 2022-06-15 (×3): 15 mL via OROMUCOSAL

## 2022-06-14 MED ORDER — LIP MEDEX EX OINT
TOPICAL_OINTMENT | CUTANEOUS | Status: DC | PRN
  Administered 2022-06-25: 75 via TOPICAL
  Administered 2022-06-26 – 2022-06-28 (×4): 1 via TOPICAL
  Filled 2022-06-14: qty 7

## 2022-06-14 MED ORDER — POTASSIUM CHLORIDE 10 MEQ/50ML IV SOLN
10.0000 meq | INTRAVENOUS | Status: AC
  Administered 2022-06-14 (×4): 10 meq via INTRAVENOUS
  Filled 2022-06-14 (×4): qty 50

## 2022-06-14 MED ORDER — ALBUMIN HUMAN 25 % IV SOLN
12.5000 g | Freq: Once | INTRAVENOUS | Status: AC
  Administered 2022-06-14: 12.5 g via INTRAVENOUS
  Filled 2022-06-14: qty 50

## 2022-06-14 MED ORDER — SODIUM CHLORIDE 0.9 % IV SOLN: INTRAVENOUS | Status: DC | PRN

## 2022-06-14 NOTE — Consult Note (Signed)
Consultation Note Date: 06/14/2022   Patient Name: Deanna Mcmillan  DOB: 1971-06-02  MRN: [id number]  Age / Sex: 51 y.o., female  PCP: Elisabeth Cara, PA-C Referring Physician: Brand Males, MD  Reason for Consultation: Establishing goals of care  HPI/Patient Profile: 51 y.o. female  with past medical history of ETOH cirrhosis admitted on 05/30/2022 with severe sepsis and acute metabolic encephalopathy. 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 With acute respiratory failure as well - extubated 11/22. PMT consulted to discuss [place].    Clinical Assessment and Goals of Care: I have reviewed medical records including EPIC notes, labs and imaging, received report from RN and Dr. Carlis Abbott, assessed the patient and then met with patient's mother Neoma Laming  to discuss diagnosis prognosis, GOC, EOL wishes, disposition and options.  I introduced Palliative Medicine as specialized medical care for people living with serious illness. It focuses on providing relief from the symptoms and stress of a serious illness. The goal is to improve quality of life for both the patient and the family.  Patient had just had swallow eval with SLP - remains high risk for aspiration. Patient was extubated yesterday - required bipap following extubation and through the night. Per RN, mental status worsening - may go back on bipap. Also having some hypotension - RN alerting primary.    We discussed patient's current illness and what it means in the larger context of patient's on-going co-morbidities.  Natural disease trajectory and expectations at EOL were discussed.  I attempted to elicit values and goals of care important to the patient.  Neoma Laming tells me she did not realize how sick the patient was until her conversation with Dr. Carlis Abbott earlier this week.  She tells me she understands the patient has a long recovery ahead of her.  She tells me the patient's goals are for  recovery and they remain open to all medical interventions offered to prolong life.  This would include reintubation or trach if necessary.  Discussed with Neoma Laming the importance of continued conversation with family and the medical providers regarding overall plan of care and treatment options, ensuring decisions are within the context of the patient's values and GOCs.    We discussed that palliative would follow along and continue discussions as necessary.  Patient's mother is agreeable to this.  Her contact information was provided to her.  Questions and concerns were addressed. The family was encouraged to call with questions or concerns.    Primary Decision Maker PATIENT  Mother is HCPOA - document on file in epic   SUMMARY OF RECOMMENDATIONS   -initial goals of care discussion - open to all interventions offered to prolong life at this point - agreeable to ongoing PMT follow up  Code Status/Advance Care Planning: Full code     Primary Diagnoses: Present on Admission:  Acute respiratory failure with hypoxia ([place])   I have reviewed the medical record, interviewed the patient and family, and examined the patient. The following aspects are pertinent.  No past medical history on file. Social History   Socioeconomic History   Marital status: Divorced    Spouse name: Not on file   Number of children: Not on file   Years of education: Not on file   Highest education level: Not on file  Occupational History   Not on file  Tobacco Use   Smoking status: Never   Smokeless tobacco: Not on file  Substance and Sexual Activity   Alcohol use: Yes    Comment: daily  one glass of wine   Drug use: No   Sexual activity: Not on file  Other Topics Concern   Not on file  Social History Narrative   Not on file   Social Determinants of Health   Financial Resource Strain: Not on file  Food Insecurity: No Food Insecurity (05/11/2022)   Hunger Vital Sign    Worried About Running  Out of Food in the Last Year: Never true    Ran Out of Food in the Last Year: Never true  Transportation Needs: No Transportation Needs (05/11/2022)   PRAPARE - Hydrologist (Medical): No    Lack of Transportation (Non-Medical): No  Physical Activity: Not on file  Stress: Not on file  Social Connections: Not on file   No family history on file. Scheduled Meds:  Chlorhexidine Gluconate Cloth  6 each Topical Daily   famotidine  20 mg Per Tube BID   folic acid  1 mg Per Tube Daily   heparin  5,000 Units Subcutaneous Q8H   insulin aspart  0-15 Units Subcutaneous Q4H   insulin aspart  2 Units Subcutaneous Q4H   lactulose  10 g Per Tube TID   mouth rinse  15 mL Mouth Rinse Q2H   phosphorus  250 mg Per Tube Daily   potassium chloride  40 mEq Per Tube BID   rifaximin  550 mg Per Tube BID   sodium chloride flush  10-40 mL Intracatheter Q12H   spironolactone  50 mg Per Tube Daily   thiamine  100 mg Per Tube Daily   zinc sulfate  220 mg Per Tube Daily   Continuous Infusions:  sodium chloride Stopped (06/14/22 0835)   sodium chloride Stopped (06/14/22 0834)   ampicillin (OMNIPEN) IV Stopped (06/14/22 0803)   feeding supplement (VITAL AF 1.2 CAL) 60 mL/hr at 06/14/22 1000   gentamicin Stopped (06/13/22 2037)   lactated ringers     PRN Meds:.sodium chloride, sodium chloride, acetaminophen, docusate, lip balm, mouth rinse, polyethylene glycol, sodium chloride flush, white petrolatum No Known Allergies Review of Systems  Unable to perform ROS: Mental status change    Physical Exam Constitutional:      General: She is not in acute distress.    Appearance: She is ill-appearing.     Comments: Eyes open, makes eye contact, very delayed responses, difficulty following commands  Pulmonary:     Effort: Pulmonary effort is normal.  Skin:    General: Skin is warm and dry.     Vital Signs: BP (!) 99/50   Pulse 91   Temp 97.7 F (36.5 C) (Oral)   Resp (!) 29    Wt 73.6 kg   SpO2 97%   BMI 27.85 kg/m  Pain Scale: 0-10   Pain Score: 0-No pain   SpO2: SpO2: 97 % O2 Device:SpO2: 97 % O2 Flow Rate: .O2 Flow Rate (L/min): (S) 3 L/min  IO: Intake/output summary:  Intake/Output Summary (Last 24 hours) at 06/14/2022 1026 Last data filed at 06/14/2022 1000 Gross per 24 hour  Intake 3254.45 ml  Output 2950 ml  Net 304.45 ml    LBM: Last BM Date : 05/13/22 Baseline Weight: Weight: 101 kg Most recent weight: Weight: 73.6 kg       *Please note that this is a verbal dictation therefore any spelling or grammatical errors are due to the "[employer]" system interpretation.   Juel Burrow, DNP, AGNP-C Palliative Medicine Team [phone] Pager: [phone]

## 2022-06-14 NOTE — Progress Notes (Addendum)
Patient with ileus on KUB, likely explaining abdominal pain. Discussed with Dr. Paulita Fujita [facility]) given concomitant cirrhosis and risk for esophageal varices prior to NGT placement. Given no platelet abnormalities and no evidence of varices on CT scan, ok to proceed with NGT placement.   Given these findings, lower suspicion for concomitant infection as cause of pain. She is already on gentamicin, which should provide adequate GN coverage. Not covered for anaerobes as of now but no fevers and only mild leukocytosis (possibly from ileus) so will hold off on expanding coverage at this time  Plan: -place NGT for ileus -TF stopped -appreciate GI assistance -abdominal U/S to r/o worsening ascites still not done -continue current antibiotics   Updated patient and patient's mother. Agreeable to NGT placement.

## 2022-06-14 NOTE — Progress Notes (Signed)
ATTESTATION & SIGNATURE   STAFF NOTE: I, Dr Ann Lions have personally reviewed patient's available data, including medical history, events of note, physical examination and test results as part of my evaluation. I have discussed with resident/NP and other care providers such as pharmacist, RN and RRT.  In addition,  I personally evaluated patient and elicited key findings of   S: Date of admit 06/20/2022 with LOS 16 for today 06/14/2022 : Deanna Mcmillan is   - no acute events overnight. On 3L [place]. Extubated yesterday. ON on BiPAP. More awake and alert per resident- > lips and nods and tracks. Able to say name but not tiune   This am -> abd pain with abd distension per rewident ([place] 10/20 small ascites but CT abd 11/7 - mod ascites)  On TF  Not on pressors but BP getting soft (being diuresed) -> diuresis stopped; -8.7 L since admit - > MAP 54  Overall difsusely weak  MRSA abx - completed but still on amp and gent for listeria  Failed swallow this am   has no past medical history on file.   has a past surgical history that includes Knee arthroscopy w/ ACL reconstruction (Left, 1994/5); Wisdom tooth extraction (1985); and laparoscopic appendectomy (N/A, 04/11/2013).   O:  Blood pressure (!) 99/59, pulse 89, temperature 97.7 F (36.5 C), temperature source Oral, resp. rate (!) 24, weight 73.6 kg, SpO2 96 %.   Awake but confused Tries to follow simple command Diffusely decondtioned Has Panda ASTERIXIS + ABd distended but soft, Non specific tednerss = mild . No rebound. No guarding   LABS    PULMONARY No results for input(s): "PHART", "PCO2ART", "PO2ART", "HCO3", "TCO2", "O2SAT" in the last 168 hours.  Invalid input(s): "PCO2", "PO2"  CBC Recent Labs  Lab 06/12/22 0541 06/13/22 0450 06/14/22 0306  HGB 8.9* 8.9* 9.1*  HCT 26.8* 29.2* 30.0*  WBC 11.0* 9.5 10.8*  PLT 207 229 216    COAGULATION Recent Labs  Lab 06/09/22 1153  INR 1.2    CARDIAC  No results for  input(s): "TROPONINI" in the last 168 hours. No results for input(s): "PROBNP" in the last 168 hours.   CHEMISTRY Recent Labs  Lab 06/10/22 0425 06/11/22 0336 06/12/22 0541 06/13/22 0450 06/14/22 0306  NA 142 144 145 147* 151*  K 4.1 3.9 3.7 3.2* 3.3*  CL 102 105 105 109 110  CO2 27 27 26 26 26   GLUCOSE 140* 144* 164* 191* 167*  BUN 31* 35* 42* 47* 51*  CREATININE 0.58 0.61 0.75 0.70 0.59  CALCIUM 9.9 9.7 9.0 8.4* 9.4  MG 2.5* 2.1  --   --  2.6*  PHOS 3.1 3.7  --   --   --    Estimated Creatinine Clearance: 80.9 mL/min (by C-G formula based on SCr of 0.59 mg/dL).   LIVER Recent Labs  Lab 06/08/22 0518 06/09/22 1153 06/12/22 0541 06/13/22 0450 06/14/22 0306  AST 69*  --  103* 72* 49*  ALT 18  --  33 23 17  ALKPHOS 105  --  124 111 105  BILITOT 3.0*  --  2.7* 2.7* 2.2*  PROT 6.1*  --  6.5 6.5 6.8  ALBUMIN 2.7*  --  2.7* 2.6* 2.7*  INR  --  1.2  --   --   --      INFECTIOUS No results for input(s): "LATICACIDVEN", "PROCALCITON" in the last 168 hours.   ENDOCRINE CBG (last 3)  Recent Labs    06/13/22  1936 06/14/22 0306 06/14/22 0734  GLUCAP 157* 148* 125*         IMAGING x24h  - image(s) personally visualized  -   highlighted in bold No results found.    A:  Mild hypernatremia Acute obtunded encephalopathy due to hepatic with asterixis Getting hypotensive again S/p extubation Worsening abd distension amd mild tenderness   P: restart pressors + alb + midodrine -> MAP goal > 65 Increase lactulose, continue xifaxan Stop TF Check lactate, liapse, ABd [place] and kub  Keep in ICU Mom and son updated at bedside   Anti-infectives (From admission, onward)    Start     Dose/Rate Route Frequency Ordered Stop   06/12/22 2000  gentamicin (GARAMYCIN) IVPB 60 mg        60 mg 100 mL/hr over 30 Minutes Intravenous Every 24 hours 06/12/22 1146 06/15/22 2359   06/11/22 1230  rifaximin (XIFAXAN) tablet 550 mg        550 mg Per Tube 2 times daily  06/11/22 1135     06/08/22 1845  gentamicin (GARAMYCIN) 20 mg in dextrose 5 % 50 mL IVPB  Status:  Discontinued        20 mg 101 mL/hr over 30 Minutes Intravenous Every 12 hours 06/08/22 0753 06/12/22 1146   06/05/22 1845  gentamicin (GARAMYCIN) 40 mg in dextrose 5 % 50 mL IVPB  Status:  Discontinued        40 mg 102 mL/hr over 30 Minutes Intravenous Every 12 hours 06/05/22 0801 06/08/22 0753   06/02/22 1515  ceFAZolin (ANCEF) IVPB 2g/100 mL premix        2 g 200 mL/hr over 30 Minutes Intravenous Every 8 hours 06/02/22 1428 06/13/22 2154   06/01/22 1842  gentamicin (GARAMYCIN) IVPB 80 mg  Status:  Discontinued        80 mg 100 mL/hr over 30 Minutes Intravenous Every 12 hours 06/01/22 0800 06/05/22 0801   06/01/22 1205  vancomycin (VANCOCIN) IVPB 1000 mg/200 mL premix  Status:  Discontinued        1,000 mg 200 mL/hr over 60 Minutes Intravenous Every 12 hours 06/01/22 0839 06/02/22 1428   05/30/22 2300  vancomycin (VANCOREADY) IVPB 1250 mg/250 mL  Status:  Discontinued        1,250 mg 166.7 mL/hr over 90 Minutes Intravenous Every 24 hours 06/11/2022 2313 06/01/22 0839   05/30/22 2000  cefTRIAXone (ROCEPHIN) 2 g in sodium chloride 0.9 % 100 mL IVPB  Status:  Discontinued        2 g 200 mL/hr over 30 Minutes Intravenous Every 24 hours 05/30/22 1417 05/30/22 1443   05/30/22 2000  cefTRIAXone (ROCEPHIN) 2 g in sodium chloride 0.9 % 100 mL IVPB  Status:  Discontinued        2 g 200 mL/hr over 30 Minutes Intravenous Every 12 hours 05/30/22 1443 06/02/22 1428   05/30/22 1800  metroNIDAZOLE (FLAGYL) IVPB 500 mg  Status:  Discontinued        500 mg 100 mL/hr over 60 Minutes Intravenous Every 12 hours 05/30/22 1726 05/31/22 1155   05/30/22 1500  gentamicin (GARAMYCIN) 130 mg in dextrose 5 % 50 mL IVPB  Status:  Discontinued        5 mg/kg/day  77.1 kg 106.5 mL/hr over 30 Minutes Intravenous Every 8 hours 05/30/22 1359 06/01/22 0800   05/30/22 1330  ampicillin (OMNIPEN) 2 g in sodium chloride 0.9 %  100 mL IVPB        2 g  300 mL/hr over 20 Minutes Intravenous Every 4 hours 05/30/22 1238     05/30/22 0500  ceFEPIme (MAXIPIME) 2 g in sodium chloride 0.9 % 100 mL IVPB  Status:  Discontinued        2 g 200 mL/hr over 30 Minutes Intravenous Every 8 hours 05/28/2022 2313 05/30/22 1417   06/14/2022 2000  vancomycin (VANCOREADY) IVPB 1750 mg/350 mL        1,750 mg 175 mL/hr over 120 Minutes Intravenous  Once 06/17/2022 1952 05/30/22 0128   06/12/2022 2000  ceFEPIme (MAXIPIME) 2 g in sodium chloride 0.9 % 100 mL IVPB  Status:  Discontinued        2 g 200 mL/hr over 30 Minutes Intravenous  Once 06/05/2022 1952 05/26/2022 2003   06/19/2022 1945  ceFEPIme (MAXIPIME) 2 g in sodium chloride 0.9 % 100 mL IVPB        2 g 200 mL/hr over 30 Minutes Intravenous  Once 06/02/2022 1938 05/31/2022 2126   05/28/2022 1945  metroNIDAZOLE (FLAGYL) IVPB 500 mg        500 mg 100 mL/hr over 60 Minutes Intravenous  Once 06/15/2022 1938 06/05/2022 2322   06/04/2022 1945  vancomycin (VANCOCIN) IVPB 1000 mg/200 mL premix  Status:  Discontinued        1,000 mg 200 mL/hr over 60 Minutes Intravenous  Once 05/25/2022 1938 06/03/2022 2003        Rest per NP/medical resident whose note is outlined above and that I agree with  The patient is critically ill with multiple organ systems failure and requires high complexity decision making for assessment and support, frequent evaluation and titration of therapies, application of advanced monitoring technologies and extensive interpretation of multiple databases.   Critical Care Time devoted to patient care services described in this note is  30  Minutes. This time reflects time of care of this signee Dr Brand Males. This critical care time does not reflect procedure time, or teaching time or supervisory time of PA/NP/Med student/Med Resident etc but could involve care discussion time     Dr. Brand Males, M.D., [facility].C.P Pulmonary and Critical Care Medicine Staff Physician [place] [facility] Pager: [phone], If no answer or between  15:00h - 7:00h: call [phone]  06/14/2022 11:29 AM

## 2022-06-14 NOTE — Progress Notes (Signed)
eLink Physician-Brief Progress Note Patient Name: Deanna Mcmillan DOB: 05/24/1971 MRN: [id number]   Date of Service  06/14/2022  HPI/Events of Note  Notified of diffuse abdominal pain not relieved by Tylenol Patient ileus and NGT has been placed, TF on hold  Abdominal US Mild gallbladder wall thickening without cholelithiasis, which is likely related to hepatic dysfunction. If there is very strong clinical suspicion for acute cholecystitis, recommend nuclear medicine study.  eICU Interventions  Site of pain less likely from cholecystitis Will give a trial of Reglan for the ileus Ongoing potassium correction     Intervention Category Intermediate Interventions: Other:  Judd Lien 06/14/2022, 9:42 PM

## 2022-06-14 NOTE — Progress Notes (Signed)
NAME:  Deanna Mcmillan, MRN:  [id number], DOB:  1971-05-05, LOS: 59 ADMISSION DATE:  05/23/2022, CONSULTATION DATE:  11/7 REFERRING MD:  Lendon Colonel, EDP CHIEF COMPLAINT:  AMS   History of Present Illness:  51 yo woman with hx of recent diagnosis of alcoholic hepatitis (dx 69/48), here with ams, fever.   History from [name].  Patient was in her normal state of health, until suddenly the night PTA she developed a headache.  She went to sleep to rest.  The following day she slept throughout the day, that evening her mother went to wake her and was unable to wake her up, so she called 911.     She has been doing well since her recent hospital discharge, taking all the meds as prescribed, no new meds.  Planned follow up with GI on 11/14.  LE edema had been improving.  No cough, sob no focal symptoms.  No known sick contacts.     In ED, febrile, tachycardic, lethargic, desaturating to 70s.   Intubated by ED physician.    Had been admitted earlier in October and started on course of prenisone for alc hepatitis.    Recently seen at [facility] 10/19: LE Edema. Lasix not helpful at home.  Admitted for diuresis.  Jaundice and abd distension also noted.  Pleural effusion.  GI saw: non enough ascites for tap  Started lasix and spironolactone.  28 day course of prednisolone, miralax, lactulose, miralax. Midodrine TID    In ED got cefepime, LR 1L, flagyl ordered, intubated, started on Propofol, vanc ordered   Pertinent  Medical History  ETOH Cirrhosis BLE edema recently worsening  S/p lap appy 2014    Echo: recent normal [id number]/6270   Home meds: folic ascid, acetaminohpen, lasix 40bid, lactulose 30 TID protonix, prednisone (04/29/22), prednisolone 04/28/22 three week course, thiamine, simethicone,    Per her mother she had been drinking at least one bottle of wine daily, she isnt clear exactly how much she was drinking but she did also drink [place].  She notes she had wanted to stop recently but  been unable to.  She thinks she was depressed after experiencing perimenopause.  No tobacco.   Significant Hospital Events: Including procedures, antibiotic start and stop dates in addition to other pertinent events   11/7: Intubated in ED and admitted to ICU 11/7: [place] neg, CT A/P cirrhosis with portal hypertension, splenomegaly, moderate volume ascites, moderate bilateral pleural effusion 11/9 KUB: No ileus 11/10: LP confirms listeria meningitis 11/11 KUB: ileus 11/12 ileus resolved with aggressive bowel regimen 11/15 KUB: No ileus. 11/18 more awake, following commands 11/22 extubated to BiPAP  Interim History / Subjective:  No acute overnight events. Remained afebrile.   She tolerated BiPAP well overnight, now on 3L [place]. She is awake and alert, unable to articulate words due to significant hoarseness. No pain. She is able to tell me her name, her mother's name, that she is in a hospital. Did not know she is in [place].   Late morning, she began to have worsening abdominal pain primarily in her lower quadrants. Her MAPs began to drop to <60 and have persisted despite small fluid bolus. Her abdomen is more distended than earlier today. Not having much stool output. She does have asterixis on exam as well.  Objective   Blood pressure (!) 109/54, pulse 96, temperature 97.7 F (36.5 C), temperature source Oral, resp. rate 18, weight 73.6 kg, SpO2 98 %.    Vent Mode: Stand-by FiO2 (%):  [  40 %] 40 % PEEP:  [5 cmH20] 5 cmH20 Pressure Support:  [8 cmH20-10 cmH20] 8 cmH20   Intake/Output Summary (Last 24 hours) at 06/14/2022 0740 Last data filed at 06/14/2022 0600 Gross per 24 hour  Intake 2768.25 ml  Output 3400 ml  Net -631.75 ml   Filed Weights   06/09/22 0500 06/10/22 0424 06/11/22 0500  Weight: 72.7 kg 75.1 kg 73.6 kg   Examination: General: ill-appearing middle aged woman, laying in bed, NAD. HEENT: [place]/AT, PERRL, EOMI. CV: normal rate and regular rhythm. Pulm: clear  breath sounds bilaterally, no wheezing, crackles, or rhonchi. Tolerating 3L [place] well. Abdomen: soft, mildly distended, mild tenderness to palpation of epigastric region. Extremities: 0-1+ pitting edema in bilateral LE with compression stockings in place. Skin: thin skin with bruising/petechiae noted on bilateral UE. Neuro: awake and alert, AAOx2 (person, place). Able to elevate her left hand off of bed but not proximal muscles. Unable to wiggle toes or elevate right hand.      Latest Ref Rng & Units 06/14/2022    3:06 AM 06/13/2022    4:50 AM 06/12/2022    5:41 AM  CBC  WBC 4.0 - 10.5 K/uL 10.8  9.5  11.0   Hemoglobin 12.0 - 15.0 g/dL 9.1  8.9  8.9   Hematocrit 36.0 - 46.0 % 30.0  29.2  26.8   Platelets 150 - 400 K/uL 216  229  207       Latest Ref Rng & Units 06/14/2022    3:06 AM 06/13/2022    4:50 AM 06/12/2022    5:41 AM  CMP  Glucose 70 - 99 mg/dL 167  191  164   BUN 6 - 20 mg/dL 51  47  42   Creatinine 0.44 - 1.00 mg/dL 0.59  0.70  0.75   Sodium 135 - 145 mmol/L 151  147  145   Potassium 3.5 - 5.1 mmol/L 3.3  3.2  3.7   Chloride 98 - 111 mmol/L 110  109  105   CO2 22 - 32 mmol/L 26  26  26    Calcium 8.9 - 10.3 mg/dL 9.4  8.4  9.0   Total Protein 6.5 - 8.1 g/dL 6.8  6.5  6.5   Total Bilirubin 0.3 - 1.2 mg/dL 2.2  2.7  2.7   Alkaline Phos 38 - 126 U/L 105  111  124   AST 15 - 41 U/L 49  72  103   ALT 0 - 44 U/L 17  23  33      Resolved Hospital Problem list   Ileus Hypokalemia Hypophosphatemia Hypernatremia Thrombocytopenia  Assessment & Plan:   Hypotension, abdominal pain, distension Alcoholic cirrhosis Hepatic encephalopathy  Recent ileus (resolved) Recently diagnosed cirrhosis during last hospitalization. MELD Na score of 17. Child Pugh Class C. FIB-4 score of 7.46 indicates advance fibrosis. Peritoneal labs not consistent with SBP, however, patient received abx prior to tap. It is possible that she became overdiuresed, but given concomitant abdominal pain  and distension, worry about cirrhosis playing a role. CT from earlier this admission with moderate ascites, U/S with small volume ascites. Given discrepancy, will repeat U/S. She also developed an ileus while admitted which has since resolved, but important to keep on differential so will obtain KUB. She does have asterixis on exam. Her MAPs have persisted <60, so will provide pressor support to maintain MAP >65. -f/u amylase, lipase, lactate, ammonia -increased lactulose to 30g TID -levophed to goal MAP >65 -albumin, midodrine, D5W -  continue rifaximin, thiamine, MVI, folate, zinc -f/u U/S for ascites -f/u KUB to r/o ileus -stopped lasix -hold spironolactone if remains hypotensive  Severe sepsis - resolving  Acute metabolic encephalopathy - improving  Listeria & MSSA bacteremia Listeria meningitis -appreciate ID assistance -currently day 16 of ampicillin and gentamicin  -continue gentamicin through 11/24  -continue ampicillin through 12/1 -finished course of cefazolin for MSSA bacteremia -given narrow oral cavity and crowding with ETT in place, will await for extubation prior to re-consulting cardiology for TEE   Acute hypoxic respiratory failure - improving CAP versus aspiration pneumonia Compressive atelectasis Extubated 11/22, saturating well on 3L [place]. She continues to have significant neuromuscular weakness, likely from acute infection and prolonged ICU stay. She will need aggressive PT/OT (likely CIR) for strength training. -antibiotics as above -holding diuresis due to low BP -aggressive PT/OT -SLP eval (failed bedside swallow)  Hypernatremia -Na 147 > 151 -received bolus of 1/2 NS -started on D5W given stoppage of TF  Fluid overloaded - resolved Hypokalemia Net -0.5L overnight, net -8.8L since admission. Will hold diuresis today given that she is now hypotensive.  -holding diuresis -replete potassium -strict I/O  Hx SVT Sinus tach Prolonged Qtc, improved -monitor  electrolytes and replete PRN -monitor on tele  Protein caloric malnutrition, mild Stopped TF today given abdominal pain and distension. Will need further workup prior to restarting. -D5W with stoppage of TF  Hyperglycemia, controlled -continue SSI prn -goal BG 140-180  Family updated at bedside during rounds.  Best Practice (right click and "Reselect all SmartList Selections" daily)   Diet/type: tubefeeds DVT prophylaxis: prophylactic heparin  GI prophylaxis: PPI Lines: Central line Foley:  Yes, and it is still needed Code Status:  full code Last date of multidisciplinary goals of care discussion [updated mother at bedside 11/22]  Labs   CBC: Recent Labs  Lab 06/08/22 0518 06/10/22 0425 06/11/22 0336 06/12/22 0541 06/13/22 0450 06/14/22 0306  WBC 10.7* 10.3 9.6 11.0* 9.5 10.8*  NEUTROABS 8.2*  --   --  8.1* 6.9 8.1*  HGB 8.4* 8.8* 8.7* 8.9* 8.9* 9.1*  HCT 25.5* 27.0* 27.7* 26.8* 29.2* 30.0*  MCV 114.9* 116.9* 119.4* 116.0* 120.2* 121.0*  PLT 125* 155 176 207 229 284    Basic Metabolic Panel: Recent Labs  Lab 06/10/22 0425 06/11/22 0336 06/12/22 0541 06/13/22 0450 06/14/22 0306  NA 142 144 145 147* 151*  K 4.1 3.9 3.7 3.2* 3.3*  CL 102 105 105 109 110  CO2 27 27 26 26 26   GLUCOSE 140* 144* 164* 191* 167*  BUN 31* 35* 42* 47* 51*  CREATININE 0.58 0.61 0.75 0.70 0.59  CALCIUM 9.9 9.7 9.0 8.4* 9.4  MG 2.5* 2.1  --   --  2.6*  PHOS 3.1 3.7  --   --   --    GFR: Estimated Creatinine Clearance: 80.9 mL/min (by C-G formula based on SCr of 0.59 mg/dL). Recent Labs  Lab 06/11/22 0336 06/12/22 0541 06/13/22 0450 06/14/22 0306  WBC 9.6 11.0* 9.5 10.8Virl Axe, MD 06/14/2022, 7:40 AM

## 2022-06-14 NOTE — Progress Notes (Signed)
[facility] ADULT ICU REPLACEMENT PROTOCOL   The patient does apply for the [facility] Adult ICU Electrolyte Replacment Protocol based on the criteria listed below:   1.Exclusion criteria: TCTS, ECMO, Dialysis, and Myasthenia Gravis patients 2. Is GFR >/= 30 ml/min? Yes.    Patient's GFR today is >60 3. Is SCr </= 2? Yes.   Patient's SCr is 0.59 mg/dL 4. Did SCr increase >/= 0.5 in 24 hours? No. 5.Pt's weight >40kg  Yes.   6. Abnormal electrolyte(s): K+ 3.3  7. Electrolytes replaced per protocol 8.  Call MD STAT for K+ </= 2.5, Phos </= 1, or Mag </= 1 Physician:  n/a  Deanna Mcmillan 06/14/2022 4:25 AM

## 2022-06-14 NOTE — Evaluation (Signed)
Clinical/Bedside Swallow Evaluation Patient Details  Name: Deanna Mcmillan MRN: [id number] Date of Birth: January 15, 1971  Today's Date: 06/14/2022 Time: SLP Start Time (ACUTE ONLY): 0913 SLP Stop Time (ACUTE ONLY): 0931 SLP Time Calculation (min) (ACUTE ONLY): 18 min  Past Medical History: No past medical history on file. Past Surgical History:  Past Surgical History:  Procedure Laterality Date   KNEE ARTHROSCOPY W/ ACL RECONSTRUCTION Left 1994/5   Dr. Weston Anna   LAPAROSCOPIC APPENDECTOMY N/A 04/11/2013   Procedure: APPENDECTOMY LAPAROSCOPIC;  Surgeon: Stark Klein, MD;  Location: [place];  Service: General;  Laterality: N/A;   WISDOM TOOTH EXTRACTION  1985   3 extractions   HPI:  51 yo female admitted 11/7 after unable to arouse at home. Intubated 11/7-11/22. 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 PMH: ETOH hepatitis    Assessment / Plan / Recommendation  Clinical Impression  Pt presents with signs of dysphagia with concern for reduced airway protection in the setting of AMS and prolonged intubation. She produced minimal phonation, vocalizing x1 in very short duration (<1 second) with significant dysphonia and otherwise aphonic. Question the impact of mentation as well, as she also does not follow a lot of commands overall, including prompts to cough or swallow. She is very eager for ice chips, chewing them swiftly and apeparing to swallow fairly consistently, although hyolaryngeal movement to palpation seems to be minimal. Pt has frequent coughing after ice chips that sounds weak, with concern that it would not be an effective cough to protect the airway. Recommend that pt remain NPO with frequent oral care. Will f/u for potential to initiate PO diet, likely needing swallow study first, pending improvements in mentation, vocal quality, and/or less coughing with POs. SLP Visit Diagnosis: Dysphagia, unspecified (R13.10)    Aspiration Risk  Severe aspiration risk    Diet Recommendation  NPO;Alternative means - temporary   Medication Administration: Via alternative means    Other  Recommendations Oral Care Recommendations: Oral care QID Other Recommendations: Have oral suction available    Recommendations for follow up therapy are one component of a multi-disciplinary discharge planning process, led by the attending physician.  Recommendations may be updated based on patient status, additional functional criteria and insurance authorization.  Follow up Recommendations  (tba)      Assistance Recommended at Discharge    Functional Status Assessment Patient has had a recent decline in their functional status and demonstrates the ability to make significant improvements in function in a reasonable and predictable amount of time.  Frequency and Duration min 2x/week  2 weeks       Prognosis Prognosis for Safe Diet Advancement: Good      Swallow Study   General HPI: 51 yo female admitted 11/7 after unable to arouse at home. Intubated 11/7-11/22. 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 PMH: ETOH hepatitis Type of Study: Bedside Swallow Evaluation Previous Swallow Assessment: none in chart Diet Prior to this Study: NPO Temperature Spikes Noted: No Respiratory Status: Nasal cannula History of Recent Intubation: Yes Length of Intubations (days): 15 days Date extubated: 06/13/22 Behavior/Cognition: Alert;Requires cueing Oral Cavity Assessment: Within Functional Limits Oral Care Completed by SLP: Recent completion by staff Oral Cavity - Dentition: Adequate natural dentition Self-Feeding Abilities: Total assist Patient Positioning: Upright in bed Baseline Vocal Quality: Aphonic (primarily aphonic, did vocalize x1 but very dysphonic and brief in duration (<1 second)) Volitional Cough: Cognitively unable to elicit (spontaneous cough is very weak) Volitional Swallow: Unable to elicit    Oral/Motor/Sensory Function Overall Oral Motor/Sensory  Function: Generalized oral weakness  (a lot of difficulty following commands but does follow a few and appears to be symmetrical)   Ice Chips Ice chips: Impaired Presentation: Spoon Pharyngeal Phase Impairments: Cough - Immediate;Cough - Delayed;Decreased hyoid-laryngeal movement   Thin Liquid Thin Liquid: Not tested    Nectar Thick Nectar Thick Liquid: Not tested   Honey Thick Honey Thick Liquid: Not tested   Puree Puree: Not tested   Solid     Solid: Not tested      Osie Bond., M.A. [place] Office [phone]  Secure chat preferred  06/14/2022,9:39 AM

## 2022-06-14 NOTE — Progress Notes (Signed)
[place] [facility] for Infectious Disease    Date of Admission:  06/16/2022             ID: Deanna Mcmillan is a 51 y.o. 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  Principal Problem:   Acute respiratory failure with hypoxia (HCC) Active Problems:   Pressure injury of skin   Toxic metabolic encephalopathy   On mechanically assisted ventilation (HCC)   Sepsis with acute hypoxic respiratory failure (HCC)   Listeria meningitis   Acute pulmonary edema (HCC)   Septic shock (HCC)    Subjective: Afebrile Persistent hypotension and pulmonary is adding back pressors; they also want to consider intraabd infection given her cirrhosis  She remains on tubefeed with loose stools  Hypernatremia Serum creatinine stable  Medications:   Chlorhexidine Gluconate Cloth  6 each Topical Daily   folic acid  1 mg Per Tube Daily   heparin  5,000 Units Subcutaneous Q8H   insulin aspart  0-15 Units Subcutaneous Q4H   lactulose  30 g Per Tube TID   midodrine  5 mg Per Tube Q8H   mouth rinse  15 mL Mouth Rinse Q2H   phosphorus  250 mg Per Tube Daily   potassium chloride  40 mEq Per Tube BID   rifaximin  550 mg Per Tube BID   sodium chloride flush  10-40 mL Intracatheter Q12H   spironolactone  50 mg Per Tube Daily   thiamine  100 mg Per Tube Daily   zinc sulfate  220 mg Per Tube Daily    Objective: Vital signs in last 24 hours: Temp:  [97.3 F (36.3 C)-97.7 F (36.5 C)] 97.7 F (36.5 C) (11/23 0735) Pulse Rate:  [82-96] 89 (11/23 1045) Resp:  [16-40] 24 (11/23 1045) BP: (87-123)/(35-70) 99/59 (11/23 1045) SpO2:  [96 %-100 %] 96 % (11/23 1045) FiO2 (%):  [40 %] 40 % (11/23 0437)  Physical Exam  General/constitutional: chronically ill appearing, sleepy vs altered, not interacting HEENT: Normocephalic, PER, Conj Clear CV: rrr no mrg Lungs: clear to auscultation, normal respiratory effort Abd: Soft but distended, Nontender Ext: trace edema in  extremities Skin: multiple echymoses/purpura in extremities Neuro: unable to examine due to ams MSK: no peripheral joint swelling/warmth    Lab Results Recent Labs    06/13/22 0450 06/14/22 0306  WBC 9.5 10.8*  HGB 8.9* 9.1*  HCT 29.2* 30.0*  NA 147* 151*  K 3.2* 3.3*  CL 109 110  CO2 26 26  BUN 47* 51*  CREATININE 0.70 0.59   Liver Panel Recent Labs    06/13/22 0450 06/14/22 0306  PROT 6.5 6.8  ALBUMIN 2.6* 2.7*  AST 72* 49*  ALT 23 17  ALKPHOS 111 105  BILITOT 2.7* 2.2*   Sedimentation Rate No results for input(s): "ESRSEDRATE" in the last 72 hours. C-Reactive Protein No results for input(s): "CRP" in the last 72 hours.  Microbiology: reviewed Studies/Results: DG Abd 1 View  Result Date: 06/14/2022 CLINICAL DATA:  Hepatic cirrhosis with ascites. EXAM: ABDOMEN - 1 VIEW COMPARISON:  Abdominal radiograph dated June 06, 2022 FINDINGS: Gaseous distention of the bowel loops in a nonobstructive pattern. Weighted tip feeding tube with distal tip in the pyloric region. No appreciable intraperitoneal free air. Irregularity of the left superior pubic ramus. IMPRESSION: 1.  Weighted tip feeding tube with distal tip in the pyloric region. 2. Gaseous distention of multiple bowel loops in a nonobstructive pattern suggesting ileus. 3. Irregularity of the left pubic ramus  about the pubic symphysis, which may be secondary to motion artifact. Follow-up examination is suggested. Electronically Signed   By: Keane Police D.O.   On: 06/14/2022 12:20   [place] EKG SITE RITE  Result Date: 06/12/2022 If Site Rite image not attached, placement could not be confirmed due to current cardiac rhythm.   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 currently day 15 of amp and gent therapy.  Previously planned 2 week treatment although given cirrhosis and tenuous status I would extend abx treatment to 3 weeks 11/08-11/29  -no clear clinical superiority of gent combination over amp monotherapy so if kidney function becomes a concern ok to stop gentamicin   Hypotension. Discussed with team. ?diuresis vs intraabd infecition such as sbp. Creatinine stable no sign of HRS. No hypotension/fever  -if sbp being the concern alone agree amp/gent likely suffice. -if secondary peritonitis or complicated abd infection a worry I would stop gent and add levo/flagyl -agree with workup for sbp in setting hypotension/cirrhosis -discussed with primary team   MSSA/MSSE bacteremia = has completed 14 days of therapy as of 11/23.   Respiratory failure on vent in setting sepsis/meningitis. Extubated Cirrhosis Chronic anemia          Jabier Mutton, [place] for Infectious Disease [place] [phone]  pager   [phone] cell 06/14/2022, 1:10 PM   06/14/2022, 12:59 PM

## 2022-06-15 ENCOUNTER — Inpatient Hospital Stay (HOSPITAL_COMMUNITY): Payer: 59

## 2022-06-15 DIAGNOSIS — J988 Other specified respiratory disorders: Secondary | ICD-10-CM

## 2022-06-15 DIAGNOSIS — G9340 Encephalopathy, unspecified: Secondary | ICD-10-CM | POA: Diagnosis not present

## 2022-06-15 DIAGNOSIS — A329 Listeriosis, unspecified: Secondary | ICD-10-CM | POA: Diagnosis not present

## 2022-06-15 DIAGNOSIS — A419 Sepsis, unspecified organism: Secondary | ICD-10-CM | POA: Diagnosis not present

## 2022-06-15 DIAGNOSIS — R6521 Severe sepsis with septic shock: Secondary | ICD-10-CM | POA: Diagnosis not present

## 2022-06-15 DIAGNOSIS — J9601 Acute respiratory failure with hypoxia: Secondary | ICD-10-CM | POA: Diagnosis not present

## 2022-06-15 LAB — COMPREHENSIVE METABOLIC PANEL
ALT: 21 U/L (ref 0–44)
AST: 57 U/L — ABNORMAL HIGH (ref 15–41)
Albumin: 3 g/dL — ABNORMAL LOW (ref 3.5–5.0)
Alkaline Phosphatase: 112 U/L (ref 38–126)
Anion gap: 17 — ABNORMAL HIGH (ref 5–15)
BUN: 57 mg/dL — ABNORMAL HIGH (ref 6–20)
CO2: 27 mmol/L (ref 22–32)
Calcium: 9.9 mg/dL (ref 8.9–10.3)
Chloride: 109 mmol/L (ref 98–111)
Creatinine, Ser: 0.89 mg/dL (ref 0.44–1.00)
GFR, Estimated: >60 mL/min (ref >60)
Glucose, Bld: 170 mg/dL — ABNORMAL HIGH (ref 70–99)
Potassium: 4.5 mmol/L (ref 3.5–5.1)
Sodium: 153 mmol/L — ABNORMAL HIGH (ref 135–145)
Total Bilirubin: 2.7 mg/dL — ABNORMAL HIGH (ref 0.3–1.2)
Total Protein: 7.2 g/dL (ref 6.5–8.1)

## 2022-06-15 LAB — POCT I-STAT 7, (LYTES, BLD GAS, ICA,H+H)
Acid-Base Excess: 9 mmol/L — ABNORMAL HIGH (ref 0.0–2.0)
Acid-Base Excess: 11 mmol/L — ABNORMAL HIGH (ref 0.0–2.0)
Acid-Base Excess: 10 mmol/L — ABNORMAL HIGH (ref 0.0–2.0)
Bicarbonate: 31.3 mmol/L — ABNORMAL HIGH (ref 20.0–28.0)
Bicarbonate: 34.7 mmol/L — ABNORMAL HIGH (ref 20.0–28.0)
Bicarbonate: 34.5 mmol/L — ABNORMAL HIGH (ref 20.0–28.0)
Calcium, Ion: 1.18 mmol/L (ref 1.15–1.40)
Calcium, Ion: 1.13 mmol/L — ABNORMAL LOW (ref 1.15–1.40)
Calcium, Ion: 1.12 mmol/L — ABNORMAL LOW (ref 1.15–1.40)
HCT: 32 % — ABNORMAL LOW (ref 36.0–46.0)
HCT: 32 % — ABNORMAL LOW (ref 36.0–46.0)
HCT: 32 % — ABNORMAL LOW (ref 36.0–46.0)
Hemoglobin: 10.9 g/dL — ABNORMAL LOW (ref 12.0–15.0)
Hemoglobin: 10.9 g/dL — ABNORMAL LOW (ref 12.0–15.0)
Hemoglobin: 10.9 g/dL — ABNORMAL LOW (ref 12.0–15.0)
O2 Saturation: 95 %
O2 Saturation: 98 %
O2 Saturation: 95 %
Patient temperature: 97.6
Patient temperature: 97.6
Potassium: 5 mmol/L (ref 3.5–5.1)
Potassium: 4.6 mmol/L (ref 3.5–5.1)
Potassium: 4.9 mmol/L (ref 3.5–5.1)
Sodium: 151 mmol/L — ABNORMAL HIGH (ref 135–145)
Sodium: 149 mmol/L — ABNORMAL HIGH (ref 135–145)
Sodium: 148 mmol/L — ABNORMAL HIGH (ref 135–145)
TCO2: 32 mmol/L (ref 22–32)
TCO2: 36 mmol/L — ABNORMAL HIGH (ref 22–32)
TCO2: 36 mmol/L — ABNORMAL HIGH (ref 22–32)
pCO2 arterial: 35.2 mmHg (ref 32–48)
pCO2 arterial: 38.2 mmHg (ref 32–48)
pCO2 arterial: 43.8 mmHg (ref 32–48)
pH, Arterial: 7.557 — ABNORMAL HIGH (ref 7.35–7.45)
pH, Arterial: 7.564 — ABNORMAL HIGH (ref 7.35–7.45)
pH, Arterial: 7.502 — ABNORMAL HIGH (ref 7.35–7.45)
pO2, Arterial: 67 mmHg — ABNORMAL LOW (ref 83–108)
pO2, Arterial: 91 mmHg (ref 83–108)
pO2, Arterial: 69 mmHg — ABNORMAL LOW (ref 83–108)

## 2022-06-15 LAB — GLUCOSE, CAPILLARY
Glucose-Capillary: 158 mg/dL — ABNORMAL HIGH (ref 70–99)
Glucose-Capillary: 157 mg/dL — ABNORMAL HIGH (ref 70–99)
Glucose-Capillary: 129 mg/dL — ABNORMAL HIGH (ref 70–99)
Glucose-Capillary: 146 mg/dL — ABNORMAL HIGH (ref 70–99)
Glucose-Capillary: 207 mg/dL — ABNORMAL HIGH (ref 70–99)
Glucose-Capillary: 135 mg/dL — ABNORMAL HIGH (ref 70–99)

## 2022-06-15 LAB — BASIC METABOLIC PANEL WITH GFR
Anion gap: 15 (ref 5–15)
BUN: 70 mg/dL — ABNORMAL HIGH (ref 6–20)
CO2: 31 mmol/L (ref 22–32)
Calcium: 9.3 mg/dL (ref 8.9–10.3)
Chloride: 104 mmol/L (ref 98–111)
Creatinine, Ser: 1.88 mg/dL — ABNORMAL HIGH (ref 0.44–1.00)
GFR, Estimated: 32 mL/min — ABNORMAL LOW
Glucose, Bld: 129 mg/dL — ABNORMAL HIGH (ref 70–99)
Potassium: 4.6 mmol/L (ref 3.5–5.1)
Sodium: 150 mmol/L — ABNORMAL HIGH (ref 135–145)

## 2022-06-15 LAB — MAGNESIUM
Magnesium: 2.6 mg/dL — ABNORMAL HIGH (ref 1.7–2.4)
Magnesium: 3 mg/dL — ABNORMAL HIGH (ref 1.7–2.4)

## 2022-06-15 LAB — PHOSPHORUS: Phosphorus: 3.6 mg/dL (ref 2.5–4.6)

## 2022-06-15 LAB — CBC WITH DIFFERENTIAL/PLATELET
Abs Immature Granulocytes: 0.18 10*3/uL — ABNORMAL HIGH (ref 0.00–0.07)
Basophils Absolute: 0 10*3/uL (ref 0.0–0.1)
Basophils Relative: 0 %
Eosinophils Absolute: 0.1 10*3/uL (ref 0.0–0.5)
Eosinophils Relative: 0 %
HCT: 32.6 % — ABNORMAL LOW (ref 36.0–46.0)
Hemoglobin: 10.5 g/dL — ABNORMAL LOW (ref 12.0–15.0)
Immature Granulocytes: 1 %
Lymphocytes Relative: 14 %
Lymphs Abs: 1.9 10*3/uL (ref 0.7–4.0)
MCH: 38.3 pg — ABNORMAL HIGH (ref 26.0–34.0)
MCHC: 32.2 g/dL (ref 30.0–36.0)
MCV: 119 fL — ABNORMAL HIGH (ref 80.0–100.0)
Monocytes Absolute: 1.3 10*3/uL — ABNORMAL HIGH (ref 0.1–1.0)
Monocytes Relative: 10 %
Neutro Abs: 9.6 10*3/uL — ABNORMAL HIGH (ref 1.7–7.7)
Neutrophils Relative %: 75 %
Platelets: 277 10*3/uL (ref 150–400)
RBC: 2.74 MIL/uL — ABNORMAL LOW (ref 3.87–5.11)
RDW: 17.4 % — ABNORMAL HIGH (ref 11.5–15.5)
WBC: 13.1 10*3/uL — ABNORMAL HIGH (ref 4.0–10.5)
nRBC: 0.5 % — ABNORMAL HIGH (ref 0.0–0.2)

## 2022-06-15 LAB — PROCALCITONIN: Procalcitonin: 0.55 ng/mL

## 2022-06-15 LAB — LACTIC ACID, PLASMA: Lactic Acid, Venous: 1.7 mmol/L (ref 0.5–1.9)

## 2022-06-15 LAB — AMMONIA: Ammonia: 108 umol/L — ABNORMAL HIGH (ref 9–35)

## 2022-06-15 MED ORDER — ROCURONIUM BROMIDE 50 MG/5ML IV SOLN
70.0000 mg | Freq: Once | INTRAVENOUS | Status: AC
  Filled 2022-06-15: qty 7

## 2022-06-15 MED ORDER — PANTOPRAZOLE SODIUM 40 MG IV SOLR
40.0000 mg | INTRAVENOUS | Status: DC
  Administered 2022-06-15 – 2022-07-03 (×19): 40 mg via INTRAVENOUS
  Filled 2022-06-15 (×20): qty 10

## 2022-06-15 MED ORDER — FAMOTIDINE 20 MG PO TABS: 20.0000 mg | ORAL_TABLET | Freq: Two times a day (BID) | ORAL | Status: DC

## 2022-06-15 MED ORDER — DEXTROSE 5 % IV SOLN: INTRAVENOUS | Status: DC

## 2022-06-15 MED ORDER — ALBUMIN HUMAN 25 % IV SOLN
12.5000 g | Freq: Once | INTRAVENOUS | Status: AC
  Administered 2022-06-15: 12.5 g via INTRAVENOUS
  Filled 2022-06-15: qty 50

## 2022-06-15 MED ORDER — ORAL CARE MOUTH RINSE: 15.0000 mL | OROMUCOSAL | Status: DC | PRN

## 2022-06-15 MED ORDER — FENTANYL CITRATE PF 50 MCG/ML IJ SOSY: 50.0000 ug | PREFILLED_SYRINGE | Freq: Once | INTRAMUSCULAR | Status: AC

## 2022-06-15 MED ORDER — FOLIC ACID 5 MG/ML IJ SOLN
1.0000 mg | Freq: Every day | INTRAMUSCULAR | Status: DC
  Administered 2022-06-16 – 2022-06-21 (×6): 1 mg via INTRAVENOUS
  Filled 2022-06-15 (×6): qty 0.2

## 2022-06-15 MED ORDER — ETOMIDATE 2 MG/ML IV SOLN: 10.0000 mg | Freq: Once | INTRAVENOUS | Status: AC

## 2022-06-15 MED ORDER — LACTULOSE 10 GM/15ML PO SOLN
30.0000 g | ORAL | Status: DC
  Administered 2022-06-15 (×2): 30 g
  Filled 2022-06-15 (×3): qty 45

## 2022-06-15 MED ORDER — SUCCINYLCHOLINE CHLORIDE 200 MG/10ML IV SOSY
PREFILLED_SYRINGE | INTRAVENOUS | Status: AC
  Filled 2022-06-15: qty 10

## 2022-06-15 MED ORDER — MEDIHONEY WOUND/BURN DRESSING EX PSTE
1.0000 | PASTE | Freq: Every day | CUTANEOUS | Status: DC
  Administered 2022-06-15 – 2022-06-22 (×9): 1 via TOPICAL
  Filled 2022-06-15 (×2): qty 44

## 2022-06-15 MED ORDER — LINEZOLID 600 MG/300ML IV SOLN
600.0000 mg | Freq: Two times a day (BID) | INTRAVENOUS | Status: DC
  Administered 2022-06-15 – 2022-06-18 (×7): 600 mg via INTRAVENOUS
  Filled 2022-06-15 (×8): qty 300

## 2022-06-15 MED ORDER — MIDAZOLAM HCL 2 MG/2ML IJ SOLN
1.0000 mg | INTRAMUSCULAR | Status: DC | PRN
  Administered 2022-06-17: 2 mg via INTRAVENOUS
  Filled 2022-06-15 (×2): qty 2

## 2022-06-15 MED ORDER — MIDAZOLAM HCL 2 MG/2ML IJ SOLN
INTRAMUSCULAR | Status: AC
  Administered 2022-06-15: 2 mg via INTRAVENOUS
  Filled 2022-06-15: qty 2

## 2022-06-15 MED ORDER — FENTANYL CITRATE PF 50 MCG/ML IJ SOSY
50.0000 ug | PREFILLED_SYRINGE | INTRAMUSCULAR | Status: DC | PRN
  Administered 2022-06-15 – 2022-06-23 (×5): 50 ug via INTRAVENOUS
  Administered 2022-06-24 (×2): 100 ug via INTRAVENOUS
  Administered 2022-06-24: 50 ug via INTRAVENOUS
  Administered 2022-06-24: 100 ug via INTRAVENOUS
  Administered 2022-06-24 (×3): 50 ug via INTRAVENOUS
  Administered 2022-06-25 – 2022-06-27 (×7): 100 ug via INTRAVENOUS
  Administered 2022-06-28: 150 ug via INTRAVENOUS
  Administered 2022-06-28 (×5): 100 ug via INTRAVENOUS
  Administered 2022-06-28 – 2022-06-29 (×3): 150 ug via INTRAVENOUS
  Administered 2022-06-29: 50 ug via INTRAVENOUS
  Administered 2022-06-29: 200 ug via INTRAVENOUS
  Administered 2022-06-29: 100 ug via INTRAVENOUS
  Administered 2022-06-30 – 2022-07-03 (×2): 50 ug via INTRAVENOUS
  Administered 2022-07-03: 100 ug via INTRAVENOUS
  Administered 2022-07-03: 50 ug via INTRAVENOUS
  Administered 2022-07-04: 100 ug via INTRAVENOUS
  Administered 2022-07-04: 150 ug via INTRAVENOUS
  Filled 2022-06-15 (×3): qty 2
  Filled 2022-06-15: qty 1
  Filled 2022-06-15: qty 2
  Filled 2022-06-15: qty 3
  Filled 2022-06-15: qty 2
  Filled 2022-06-15: qty 1
  Filled 2022-06-15: qty 2
  Filled 2022-06-15: qty 4
  Filled 2022-06-15: qty 3
  Filled 2022-06-15: qty 1
  Filled 2022-06-15: qty 3
  Filled 2022-06-15 (×2): qty 2
  Filled 2022-06-15: qty 3
  Filled 2022-06-15 (×4): qty 2
  Filled 2022-06-15 (×2): qty 1
  Filled 2022-06-15 (×2): qty 2
  Filled 2022-06-15 (×3): qty 1
  Filled 2022-06-15: qty 2
  Filled 2022-06-15: qty 1
  Filled 2022-06-15: qty 2
  Filled 2022-06-15: qty 1
  Filled 2022-06-15: qty 3
  Filled 2022-06-15 (×2): qty 2
  Filled 2022-06-15 (×3): qty 1
  Filled 2022-06-15 (×2): qty 2

## 2022-06-15 MED ORDER — ROCURONIUM BROMIDE 10 MG/ML (PF) SYRINGE
PREFILLED_SYRINGE | INTRAVENOUS | Status: AC
  Administered 2022-06-15: 70 mg via INTRAVENOUS
  Filled 2022-06-15: qty 10

## 2022-06-15 MED ORDER — ETOMIDATE 2 MG/ML IV SOLN
INTRAVENOUS | Status: AC
  Administered 2022-06-15: 10 mg via INTRAVENOUS
  Filled 2022-06-15: qty 20

## 2022-06-15 MED ORDER — THIAMINE HCL 100 MG/ML IJ SOLN
100.0000 mg | Freq: Every day | INTRAMUSCULAR | Status: DC
  Administered 2022-06-16 – 2022-06-21 (×6): 100 mg via INTRAVENOUS
  Filled 2022-06-15 (×6): qty 2

## 2022-06-15 MED ORDER — LACTULOSE ENEMA
300.0000 mL | Freq: Two times a day (BID) | ORAL | Status: DC
  Administered 2022-06-15 – 2022-06-16 (×4): 300 mL via RECTAL
  Filled 2022-06-15 (×5): qty 300

## 2022-06-15 MED ORDER — ORAL CARE MOUTH RINSE
15.0000 mL | OROMUCOSAL | Status: DC
  Administered 2022-06-15 – 2022-07-02 (×192): 15 mL via OROMUCOSAL

## 2022-06-15 MED ORDER — CEFAZOLIN SODIUM-DEXTROSE 2-4 GM/100ML-% IV SOLN: 2.0000 g | Freq: Three times a day (TID) | INTRAVENOUS | Status: DC

## 2022-06-15 MED ORDER — NOREPINEPHRINE 4 MG/250ML-% IV SOLN
0.0000 ug/min | INTRAVENOUS | Status: DC
  Administered 2022-06-15: 7 ug/min via INTRAVENOUS
  Administered 2022-06-16: 9 ug/min via INTRAVENOUS
  Administered 2022-06-16: 13 ug/min via INTRAVENOUS
  Administered 2022-06-16: 8 ug/min via INTRAVENOUS
  Administered 2022-06-17: 15 ug/min via INTRAVENOUS
  Filled 2022-06-15 (×4): qty 250

## 2022-06-15 MED ORDER — MIDAZOLAM HCL 2 MG/2ML IJ SOLN: 2.0000 mg | Freq: Once | INTRAMUSCULAR | Status: AC

## 2022-06-15 MED ORDER — KETAMINE HCL 50 MG/5ML IJ SOSY
PREFILLED_SYRINGE | INTRAMUSCULAR | Status: AC
  Filled 2022-06-15: qty 10

## 2022-06-15 MED ORDER — FENTANYL CITRATE PF 50 MCG/ML IJ SOSY
PREFILLED_SYRINGE | INTRAMUSCULAR | Status: AC
  Administered 2022-06-15: 50 ug via INTRAVENOUS
  Filled 2022-06-15: qty 2

## 2022-06-15 NOTE — Progress Notes (Addendum)
[facility]    Date of Admission:  06/13/2022             ID: Deanna Mcmillan is a 51 y.o. 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  Principal Problem:   Acute respiratory failure with hypoxia (HCC) Active Problems:   Pressure injury of skin   Toxic metabolic encephalopathy   On mechanically assisted ventilation (HCC)   Severe sepsis with septic shock (HCC)   Listeriosis   Acute pulmonary edema (HCC)   Septic shock (HCC)    Subjective: Afebrile Persistent hypotension and pulmonary is adding back pressors; they also want to consider intraabd infection given her cirrhosis  She remains on tubefeed with loose stools  Hypernatremia Serum creatinine stable  Medications:   Chlorhexidine Gluconate Cloth  6 each Topical Daily   folic acid  1 mg Per Tube Daily   heparin  5,000 Units Subcutaneous Q8H   insulin aspart  0-15 Units Subcutaneous Q4H   lactulose  30 g Per Tube Q4H   lactulose  300 mL Rectal BID   leptospermum manuka honey  1 Application Topical Daily   midodrine  5 mg Per Tube Q8H   mouth rinse  15 mL Mouth Rinse 4 times per day   rifaximin  550 mg Per Tube BID   sodium chloride flush  10-40 mL Intracatheter Q12H   spironolactone  50 mg Per Tube Daily   thiamine  100 mg Per Tube Daily   zinc sulfate  220 mg Per Tube Daily    Objective: Vital signs in last 24 hours: Temp:  [97.6 F (36.4 C)-98.8 F (37.1 C)] 98.8 F (37.1 C) (11/24 1137) Pulse Rate:  [80-106] 106 (11/24 1000) Resp:  [17-48] 48 (11/24 1000) BP: (87-130)/(33-81) 97/74 (11/24 1158) SpO2:  [91 %-99 %] 92 % (11/24 1000) FiO2 (%):  [32 %] 32 % (11/24 0005)  Physical Exam  General/constitutional: chronically ill appearing, sleepy vs hypoactive delirium; no interaction this morning HEENT: Normocephalic, PER, Conj Clear CV: rrr no mrg Lungs: normal respiratory effort Abd: soft/distended/nontender Ext: trace edema in  extremities Skin: multiple echymoses/purpura in extremities Neuro: deferred due to ams MSK: no peripheral joint swelling/warmth    Lab Results Recent Labs    06/14/22 0306 06/15/22 0403 06/15/22 1037  WBC 10.8* 13.1*  --   HGB 9.1* 10.5* 10.9*  HCT 30.0* 32.6* 32.0*  NA 151* 153* 151*  K 3.3* 4.5 5.0  CL 110 109  --   CO2 26 27  --   BUN 51* 57*  --   CREATININE 0.59 0.89  --    Liver Panel Recent Labs    06/14/22 0306 06/15/22 0403  PROT 6.8 7.2  ALBUMIN 2.7* 3.0*  AST 49* 57*  ALT 17 21  ALKPHOS 105 112  BILITOT 2.2* 2.7*   Sedimentation Rate No results for input(s): "ESRSEDRATE" in the last 72 hours. C-Reactive Protein No results for input(s): "CRP" in the last 72 hours.  Microbiology: reviewed Studies/Results: DG Abd 1 View  Result Date: 06/15/2022 CLINICAL DATA:  Ileus. EXAM: ABDOMEN - 1 VIEW COMPARISON:  June 14, 2022. FINDINGS: Distal tip of nasogastric tube is seen in expected position of stomach. Distal tip of feeding tube is seen in expected position of distal stomach. Dilated small bowel loops are noted concerning for ileus or possibly distal small bowel obstruction. IMPRESSION: Stable dilated small bowel loops are noted concerning for ileus or possibly distal small bowel obstruction.  Electronically Signed   By: Marijo Conception M.D.   On: 06/15/2022 10:22   US Abdomen Complete  Result Date: 06/14/2022 CLINICAL DATA:  51 year old female with cirrhosis. EXAM: ABDOMEN ULTRASOUND COMPLETE COMPARISON:  06/13/2022 CT and prior studies FINDINGS: Gallbladder: Gallbladder sludge is noted with mild gallbladder wall thickening. There is no evidence of cholelithiasis or sonographic Murphy sign. Common bile duct: Diameter: Not visualized. No intrahepatic biliary dilatation noted. Liver: Heterogeneous increased hepatic echogenicity noted. No definite focal hepatic lesion noted. Equivocal nodularity of the hepatic contour or identified. Portal vein is patent on  color Doppler imaging with normal direction of blood flow towards the liver. IVC: No abnormality visualized. Pancreas: Visualized portion unremarkable. Spleen: UPPER limits normal in size. Right Kidney: Length: 11.3 cm. Echogenicity within normal limits. No mass or hydronephrosis visualized. Left Kidney: Length: 13.6 cm. Echogenicity within normal limits. No mass or hydronephrosis visualized. Abdominal aorta: No aneurysm visualized. Other findings: Trace perihepatic ascites noted. IMPRESSION: 1. Hepatic steatosis with sonographic findings suggestive of cirrhosis. No focal hepatic lesions. UPPER limits normal spleen size. 2. Trace perihepatic ascites. 3. Mild gallbladder wall thickening without cholelithiasis, which is likely related to hepatic dysfunction. If there is very strong clinical suspicion for acute cholecystitis, recommend nuclear medicine study. 4. CBD not well visualized. No evidence of intrahepatic biliary dilatation. Electronically Signed   By: Margarette Canada M.D.   On: 06/14/2022 17:46   DG Abd 1 View  Result Date: 06/14/2022 CLINICAL DATA:  Confirm orogastric tube placement. EXAM: ABDOMEN - 1 VIEW COMPARISON:  Radiograph earlier today FINDINGS: Tip of the weighted enteric tube in the right upper quadrant in the region of the distal stomach or proximal duodenum. Persisting gaseous distention of bowel loops centrally. Patchy bibasilar opacities in the included lung bases. IMPRESSION: Tip of the weighted enteric tube in the right upper quadrant in the region of the distal stomach or proximal duodenum. Electronically Signed   By: Keith Rake M.D.   On: 06/14/2022 15:23   DG Abd 1 View  Result Date: 06/14/2022 CLINICAL DATA:  Hepatic cirrhosis with ascites. EXAM: ABDOMEN - 1 VIEW COMPARISON:  Abdominal radiograph dated June 06, 2022 FINDINGS: Gaseous distention of the bowel loops in a nonobstructive pattern. Weighted tip feeding tube with distal tip in the pyloric region. No appreciable  intraperitoneal free air. Irregularity of the left superior pubic ramus. IMPRESSION: 1.  Weighted tip feeding tube with distal tip in the pyloric region. 2. Gaseous distention of multiple bowel loops in a nonobstructive pattern suggesting ileus. 3. Irregularity of the left pubic ramus about the pubic symphysis, which may be secondary to motion artifact. Follow-up examination is suggested. Electronically Signed   By: Keane Police D.O.   On: 06/14/2022 12:20    Abx: 11/20-c rifaximine 11/08-c amp 11/08-c gentamicin  11/11-22 cefazolin  11/08-11 ceftriaxone 11/07-08 vanc/cefepime/flagyl  Assessment/Plan: Disseminated listeria infection with meningitis and bacteremia = slow improvement on amp/gent. currently day 15 of amp and gent therapy. Previously planned 2 week treatment although given cirrhosis and tenuous status I would extend abx treatment to 3 weeks 11/08-11/29  -no clear clinical superiority of gent combination over amp monotherapy so if kidney function becomes a concern ok to stop gentamicin   Mssa/msse infection Hypotension. Suspect due to cirrhosis. Wbc slightly up today x1 and mentation remains poor Ultrasound abd only trace ascites and admission ascite analysis not consistent with sbp  -would repeat blood cx -I reviewed case again. She is immunosuppressed/cirrhotic and has community onset mssa bacteremia.  Ampicillin has some coverage but rather unproven -will add linezolid to continue mssa coverage -I agree tee when able -discussed with primary team   ileus 11/24 kub c/w syndrome Ngt placed Will keep abx iv for this reason   Jabier Mutton, [place] [facility] [place] [phone]  pager   [phone] cell   06/15/2022, 12:42 PM

## 2022-06-15 NOTE — Progress Notes (Signed)
Pt with shallow rapid breathing and decreased mental status.  ABG    Component Value Date/Time   PHART 7.564 (H) 06/15/2022 2107   PCO2ART 38.2 06/15/2022 2107   PO2ART 91 06/15/2022 2107   HCO3 34.7 (H) 06/15/2022 2107   TCO2 36 (H) 06/15/2022 2107   ACIDBASEDEF 4.0 (H) 05/30/2022 0453   O2SAT 98 06/15/2022 2107   BP (!) 111/58 (BP Location: Left Arm)   Pulse (!) 104   Temp 97.6 F (36.4 C) (Axillary)   Resp (!) 47   Wt 73.6 kg   SpO2 95%   BMI 27.85 kg/m   Obtunded.  Gurgling.  Diminished breath sounds b/l.  Abdomen distended with increased tympany.  1+ edema.  Assessment/plan: Concern for her ability to protect airway.  Discussed with her mother.  Decision made to proceed with reintubation.  BP borderline - will restart levophed.  Will arrange for CT abd/pelvis to further assess abdominal distention.  CC time independent of procedure time 42 minutes  Chesley Mires, MD [place] Pager - [phone] 06/15/2022, 9:54 PM

## 2022-06-15 NOTE — Progress Notes (Signed)
RT called to patient room to check for et cuff. Cuff blown, DR Halford Chessman  at bedside and re intubated with tube exchanger. Positive easy cap ETCO2 and return volume on vent. CXR verified et placement and RT pulled back et 3cm per MD request to 20cm @ lip.

## 2022-06-15 NOTE — Progress Notes (Signed)
Physical Therapy Treatment Patient Details Name: Deanna Mcmillan MRN: [id number] DOB: 11/18/1970 Today's Date: 06/15/2022   History of Present Illness 51 yo female admitted 11/7 after unable to arouse at home. Intubated 11/7-11/22. 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 Ileus 11/11-11/15, 11/23 ileus on KUB. PMhx: ETOH hepatitis    PT Comments    Pt lethargic with initial eye opening to name called but no further response throughout session. Total assist for bed mobility and chair position. PROM performed bil LE and Mcmillan present for education. Will continue to attempt efforts 2x/wk for another week prior to decrease in frequency if pt remains unable to actively participate. Pt with noted clonus bil LE this session. Pt on 5L throughout session with SPO2>94%  Supine 100/73 Sitting 119/59 (76) Sitting after 13 min 97/74 (83)   Recommendations for follow up therapy are one component of a multi-disciplinary discharge planning process, led by the attending physician.  Recommendations may be updated based on patient status, additional functional criteria and insurance authorization.  Follow Up Recommendations  Skilled nursing-short term rehab (<3 hours/day) Can patient physically be transported by private vehicle: No   Assistance Recommended at Discharge Frequent or constant Supervision/Assistance  Patient can return home with the following Two people to help with walking and/or transfers;Two people to help with bathing/dressing/bathroom;Direct supervision/assist for financial management;Direct supervision/assist for medications management;Assistance with feeding;Assistance with cooking/housework;Assist for transportation   Equipment Recommendations  Wheelchair (measurements PT);Hospital bed;Wheelchair cushion (measurements PT);Other (comment) (hoyer)    Recommendations for Other Services       Precautions / Restrictions Precautions Precautions: Fall;Other (comment) Precaution Comments:  cortrak, watch BP, NGT Restrictions Weight Bearing Restrictions: No     Mobility  Bed Mobility Overal bed mobility: Needs Assistance Bed Mobility: Supine to Sit, Rolling Rolling: Total assist   Supine to sit: Total assist     General bed mobility comments: total assist to roll bil, slide toward HOB. Bed egress to chair position. total assist for anterior translation of trunk off surface    Transfers                   General transfer comment: unable    Ambulation/Gait                   Stairs             Wheelchair Mobility    Modified Rankin (Stroke Patients Only)       Balance Overall balance assessment: Needs assistance Sitting-balance support: No upper extremity supported Sitting balance-Leahy Scale: Zero Sitting balance - Comments: Pt unable to hold herself forward in sitting  while bed in egress/chair position. On one occassion she did slowly lean back to bed with trunk but otherwise no balance response Postural control: Right lateral lean                                  Cognition Arousal/Alertness: Lethargic Behavior During Therapy: Flat affect Overall Cognitive Status: Difficult to assess                                 General Comments: Very lethargic. Minimal eye opening this session only at beginning of session with name called then remained with eyes closed despite different attempts for arousal. No command following. limited withdrawal response to pain all extremities        Exercises  General Exercises - Lower Extremity Long Arc Quad: PROM, Both, 10 reps, Seated Hip ABduction/ADduction: PROM, Both, 10 reps, Seated Hip Flexion/Marching: PROM, Both, 10 reps, Seated    General Comments        Pertinent Vitals/Pain Pain Assessment Pain Assessment: CPOT Facial Expression: Relaxed, neutral Body Movements: Absence of movements Muscle Tension: Relaxed Compliance with ventilator (intubated  pts.): N/A Vocalization (extubated pts.): Talking in normal tone or no sound CPOT Total: 0    Home Living                          Prior Function            PT Goals (current goals can now be found in the care plan section) Progress towards PT goals: Not progressing toward goals - comment    Frequency    Min 2X/week      PT Plan Discharge plan needs to be updated    Co-evaluation   Reason for Co-Treatment: Necessary to address cognition/behavior during functional activity PT goals addressed during session: Mobility/safety with mobility;Balance;Strengthening/ROM OT goals addressed during session: Strengthening/ROM;ADL's and self-care      AM-PAC PT "6 Clicks" Mobility   Outcome Measure  Help needed turning from your back to your side while in a flat bed without using bedrails?: Total Help needed moving from lying on your back to sitting on the side of a flat bed without using bedrails?: Total Help needed moving to and from a bed to a chair (including a wheelchair)?: Total Help needed standing up from a chair using your arms (e.g., wheelchair or bedside chair)?: Total Help needed to walk in hospital room?: Total Help needed climbing 3-5 steps with a railing? : Total 6 Click Score: 6    End of Session Equipment Utilized During Treatment: Oxygen Activity Tolerance: Patient limited by lethargy Patient left: in bed;with family/visitor present;with call bell/phone within reach Nurse Communication: Mobility status PT Visit Diagnosis: Other abnormalities of gait and mobility (R26.89);Muscle weakness (generalized) (M62.81);Other symptoms and signs involving the nervous system (R29.898)     Time: 9509-3267 PT Time Calculation (min) (ACUTE ONLY): 22 min  Charges:  $Therapeutic Activity: 8-22 mins                     Bayard Males, PT Acute Rehabilitation Services Office: [phone]    Deanna Mcmillan 06/15/2022, 12:04 PM

## 2022-06-15 NOTE — Progress Notes (Signed)
Occupational Therapy Treatment Patient Details Name: Deanna Mcmillan MRN: [id number] DOB: April 25, 1971 Today's Date: 06/15/2022   History of present illness 52 yo female admitted 11/7 after unable to arouse at home. Intubated 11/7-11/22. 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 Ileus 11/11-11/15, 11/23 ileus on KUB. PMhx: ETOH hepatitis   OT comments  This 51 yo female seen today with PT to see if we could elicit anymore response from her than has been able to thus far. Pt with generalized pain response (at upper traps Bil, left thigh, and right foot nail bed pressure) but not following commands. Minimally opening eyes. She will continue to benefit from OT/PT separately at bed level until progress can be shown. We will continue to follow.   Recommendations for follow up therapy are one component of a multi-disciplinary discharge planning process, led by the attending physician.  Recommendations may be updated based on patient status, additional functional criteria and insurance authorization.    Follow Up Recommendations  Skilled nursing-short term rehab (<3 hours/day)     Assistance Recommended at Discharge Frequent or constant Supervision/Assistance  Patient can return home with the following  Two people to help with walking and/or transfers;Two people to help with bathing/dressing/bathroom;Direct supervision/assist for medications management;Direct supervision/assist for financial management;Assist for transportation;Assistance with cooking/housework;Assistance with feeding;Help with stairs or ramp for entrance   Equipment Recommendations  Other (comment) (TBD next venue)       Precautions / Restrictions Precautions Precautions: Fall;Other (comment) Precaution Comments: cortrak, watch BP Restrictions Weight Bearing Restrictions: No       Mobility Bed Mobility Overal bed mobility: Needs Assistance       Supine to sit: Total assist     General bed mobility comments: total assist.  Bed egress to chair position.    Transfers                   General transfer comment: unable     Balance Overall balance assessment: Needs assistance Sitting-balance support: No upper extremity supported Sitting balance-Leahy Scale: Zero Sitting balance - Comments: Pt unable to hold herself foreward in sitting  while bed in egress/chair position. On one occassion she did slowly lean back to bed with trunk but otherwise no balance response                                   ADL either performed or assessed with clinical judgement   ADL                                         General ADL Comments: Total A for all ADLs. Placed cold washcloth on pt's face without response    Extremity/Trunk Assessment Upper Extremity Assessment Upper Extremity Assessment: RUE deficits/detail;LUE deficits/detail RUE Deficits / Details: PROM WFL, does tend to show extension posture at elbows in response to pain, arms left propped up for edema control, 1 finger sublux at shoulder at rest, intermittent clonus LUE Deficits / Details: PROM WFL, does tend to show extension posture at elbows in response to pain, arms left propped up for edema control, 1 finger sublux at shoulder at rest, intermittent clonus            Vision   Additional Comments: continue to asses functionally as she has eyes open. Did not track pictures of dog or son when shown  from mom's phone, did tend to seem to regard picture of son initially (did not have eyes open, had to manually open eye lids for her)          Cognition Arousal/Alertness: Lethargic Behavior During Therapy: Flat affect Overall Cognitive Status: Difficult to assess                                 General Comments: Very lethargic. Minimal eye opening this session and beginning of session as wel louldy came into room and once in middle of session. Facial grimacing during pain sensation testing. Did not follow  any one step commands        Exercises Other Exercises Other Exercises: Mom reports she continues to do UE and LE exercises with pt            Pertinent Vitals/ Pain       Pain Assessment Pain Assessment: CPOT Facial Expression: Tense (grimacing) Body Movements: Protection (elbow extension at times) Muscle Tension: Tense, rigid (elbow extension at times) Compliance with ventilator (intubated pts.): N/A Vocalization (extubated pts.): Talking in normal tone or no sound CPOT Total: 3 Pain Location: with muscle belly roll at shoulders, upper left thigh, right small toe Pain Descriptors / Indicators: Grimacing, Guarding Pain Intervention(s): Repositioned         Frequency  Min 2X/week        Progress Toward Goals  OT Goals(current goals can now be found in the care plan section)  Progress towards OT goals: Not progressing toward goals - comment  Acute Rehab OT Goals Patient Stated Goal: mom--wants her to be more awake consistently to participate in therapies OT Goal Formulation: With family Time For Goal Achievement: 06/26/22 Potential to Achieve Goals: [place] Discharge plan needs to be updated    Co-evaluation    PT/OT/SLP Co-Evaluation/Treatment: Yes Reason for Co-Treatment: Necessary to address cognition/behavior during functional activity PT goals addressed during session: Mobility/safety with mobility;Balance;Strengthening/ROM OT goals addressed during session: Strengthening/ROM;ADL's and self-care      AM-PAC OT "6 Clicks" Daily Activity     Outcome Measure   Help from another person eating meals?: Total Help from another person taking care of personal grooming?: Total Help from another person toileting, which includes using toliet, bedpan, or urinal?: Total Help from another person bathing (including washing, rinsing, drying)?: Total Help from another person to put on and taking off regular upper body clothing?: Total Help from another person to  put on and taking off regular lower body clothing?: Total 6 Click Score: 6    End of Session    OT Visit Diagnosis: Muscle weakness (generalized) (M62.81);Other abnormalities of gait and mobility (R26.89)   Activity Tolerance Patient limited by lethargy   Patient Left in bed;with family/visitor present;with bed alarm set           Time: 1110-1133 OT Time Calculation (min): 23 min  Charges: OT General Charges $OT Visit: 1 Visit OT Treatments $Therapeutic Activity: 8-22 mins   Deanna Mcmillan, OTR/L Acute Rehab Services [facility] [phone] Office [phone]    Almon Register 06/15/2022, 11:48 AM

## 2022-06-15 NOTE — Procedures (Addendum)
Intubation Procedure Note  KAJAL SCALICI  [id number]  01/09/1971  Date:06/15/22  Time:9:27 PM   Provider Performing:Vanessa Alesi    Procedure: Intubation (31500)  Indication(s) Respiratory Failure  Consent Risks of the procedure as well as the alternatives and risks of each were explained to the patient and/or caregiver.  Consent for the procedure was obtained and is signed in the bedside chart   Anesthesia Etomidate, Versed, Fentanyl, and Rocuronium   Time Out Verified patient identification, verified procedure, site/side was marked, verified correct patient position, special equipment/implants available, medications/allergies/relevant history reviewed, required imaging and test results available.   Sterile Technique Usual hand hygeine, masks, and gloves were used   Procedure Description Patient positioned in bed supine.  Sedation given as noted above.  Patient was intubated with endotracheal tube using Glidescope.  View was Grade 2 only posterior commissure .  Number of attempts was 1.  Colorimetric CO2 detector was consistent with tracheal placement.   Complications/Tolerance After insertion of ETT in was determined the cuff was blown.  Exchanged new ETT over bouge. Chest X-ray is ordered to verify placement.   EBL Minimal   Specimen(s) None  Chesley Mires, MD [place] Pager - [phone] 06/15/2022, 9:48 PM

## 2022-06-15 NOTE — Progress Notes (Addendum)
eLink Physician-Brief Progress Note Patient Name: Deanna Mcmillan DOB: 1970/12/07 MRN: [id number]   Date of Service  06/15/2022  HPI/Events of Note  Patient is extremely obtunded and her belly is distended and firm, may be worsening ileus but impossible to exclude an acute abdomen by remote exam, will order a stat ABG and have PCCM Ground crew assess patient at bedside.  eICU Interventions  ABG + PCCM Ground Crew requested to evaluate.        Frederik Pear 06/15/2022, 8:54 PM

## 2022-06-15 NOTE — Progress Notes (Signed)
eLink Physician-Brief Progress Note Patient Name: Deanna Mcmillan DOB: [birth year] MRN: [id number]   Date of Service  06/15/2022  HPI/Events of Note  Patient has D5 running but order has expired NPO due to ileus Already receiving thiamine  eICU Interventions  Order placed for D5     Intervention Category Intermediate Interventions: Other:  Judd Lien 06/15/2022, 6:06 AM

## 2022-06-15 NOTE — Progress Notes (Signed)
Palliative:  Chart reviewed. No palliative needs identified.  [place] discussion 06/15/22: full code, full scope - would want reintubation if needed, would also likely proceed with trach if came to it.  Patient's mother has our contact information and will call with needs.  PMT will shadow chart for acute decline; will also plan to check in with patient/family occasionally to support and address questions/concerns.   Juel Burrow, DNP, AGNP-C Palliative Medicine Team Team Phone # [phone]  Pager # [phone]  NO CHARGE

## 2022-06-15 NOTE — Progress Notes (Signed)
NAME:  Deanna Mcmillan, MRN:  [id number], DOB:  Jan 24, 1971, LOS: 56 ADMISSION DATE:  06/16/2022, CONSULTATION DATE:  11/7 REFERRING MD:  Lendon Colonel, EDP CHIEF COMPLAINT:  AMS   History of Present Illness:  51 yo woman with hx of recent diagnosis of alcoholic hepatitis (dx 50/27), here with ams, fever.   History from [name].  Patient was in her normal state of health, until suddenly the night PTA she developed a headache.  She went to sleep to rest.  The following day she slept throughout the day, that evening her mother went to wake her and was unable to wake her up, so she called 911.     She has been doing well since her recent hospital discharge, taking all the meds as prescribed, no new meds.  Planned follow up with GI on 11/14.  LE edema had been improving.  No cough, sob no focal symptoms.  No known sick contacts.     In ED, febrile, tachycardic, lethargic, desaturating to 70s.   Intubated by ED physician.    Had been admitted earlier in October and started on course of prenisone for alc hepatitis.    Recently seen at [facility] 10/19: LE Edema. Lasix not helpful at home.  Admitted for diuresis.  Jaundice and abd distension also noted.  Pleural effusion.  GI saw: non enough ascites for tap  Started lasix and spironolactone.  28 day course of prednisolone, miralax, lactulose, miralax. Midodrine TID    In ED got cefepime, LR 1L, flagyl ordered, intubated, started on Propofol, vanc ordered   Pertinent  Medical History  ETOH Cirrhosis BLE edema recently worsening  S/p lap appy 2014    Echo: recent normal 74/1287   Home meds: folic ascid, acetaminohpen, lasix 40bid, lactulose 30 TID protonix, prednisone (04/29/22), prednisolone 04/28/22 three week course, thiamine, simethicone,    Per her mother she had been drinking at least one bottle of wine daily, she isnt clear exactly how much she was drinking but she did also drink [place].  She notes she had wanted to stop recently but  been unable to.  She thinks she was depressed after experiencing perimenopause.  No tobacco.   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 11/18 more awake, following commands 1121 RUE PICC 11/22 extubated to BiPAP 11/23 - 14d of Rx complerted for MSSA/MESSE. For listeria - ID wants to extend amp/gent to 3 weeks 11/8-11/29. Ileus post extubation and NG to LIS done . [place] increased. BP sofft an dlevophed and alb Rx.  Interim History / Subjective:   11/24 -  OG placed yesterday for ileus and bile returns. TF on hold. Lactulose increased yesterday. REamins off vent. Did not need BiPAP. This AM more obtunded but mom thinks was awake at night and is sleeping due to fatigue. Afebrile.  But wBC up 13L. Neg baklance -3L:  OFF PRESSORs = Ammondia up at 72 - [place] mild periphepatic ascites  Objective   Blood pressure (!) 101/48, pulse 96, temperature 97.6 F (  36.4 C), temperature source Axillary, resp. rate (!) 36, weight 73.6 kg, SpO2 93 %.    FiO2 (%):  [32 %] 32 %   Intake/Output Summary (Last 24 hours) at 06/15/2022 0913 Last data filed at 06/15/2022 0700 Gross per 24 hour  Intake 1820.2 ml  Output 3300 ml  Net -1479.8 ml   Filed Weights   06/09/22 0500 06/10/22 0424 06/11/22 0500  Weight: 72.7 kg 75.1 kg 73.6 kg   Examination: General Appearance:  Looks criticall illl. Not on vent. DRowsy. Jaudncied Head:  Normocephalic, without obvious abnormality,  atraumatic Eyes:  PERRL - yes, conjunctiva/corneas - JAUNDICE     Ears:  Normal external ear canals, both ears Nose:  G tube - YES and , [place] 4L Throat:  ETT TUBE - NO , OG tube - YES Neck:  Supple,  No enlargement/tenderness/nodules Lungs: Clear to auscultation bilaterally,  Heart:  S1 and S2 normal, no murmur, CVP - no.  Pressors - OFF  Abdomen:  Soft, no masses, no organomegaly Genitalia / Rectal:  Not done Extremities:  Extremities- intact. RUE PICC + Skin:  ntact in exposed areas . Sacral area - FLEXI SEAL + Neurologic:  Sedation - none -> RASS - -3 . Moves all 4s - yes. CAM-ICU - drowsy but got gag and protecting airway   Resolved Hospital Problem list   Ileus Hypokalemia Hypophosphatemia Hypernatremia Thrombocytopenia  Assessment & Plan:   Hypotension -r ecurred 112/23/23,   11/24 - transeint recurrence yesterday post extubation but now resolved after albumin   Plan  - map goal > 65 - dc levophed fom MAR - continue midodrine  - give extra albumin 11/23 and repeat 11/24 - holding lasix and aldactone   Encephalopathy -hepatic and metabolic (sepsis high Na)  11/24 - droswy but protecting airway. Worse than 24h ago when awake and tracked. Ammmonia high - and lactulose increased yesterday. No effect as of now ? Due to ileus. Als could be rising Na to 153  Plna  = increase laactulose further - start rectal lactulose as add on  - contineu xifaxan - track ammonia - NPO - increase free water - check aBG  Alcoholic cirrhosis - Recently diagnosed cirrhosis during last hospitalization. MELD Na score of 17. Child Pugh Class C. FIB-4 score of 7.46 indicates advance fibrosis. Peritoneal labs admit  not consistent with SBP, however, patient received abx prior to tap.   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 Protects aiwrway  Plan  - Oral care - HOB > 30  SEvere p hysical deconditioning  Plan - She will need aggressive PT/OT (likely CIR) for strength training.   Hypernatremia  11/24 - getting  Na worse  Plan -  - increase free water   Hx SVT Sinus tach Prolonged Qtc, improved -monitor electrolytes and replete PRN -monitor on tele  Anemia - chronic - Present on Admit  Plan  - - PRBC for hgb </= 6.9gm%    - exceptions are   -  if ACS susepcted/confirmed then transfuse for hgb </= 8.0gm%,  or    -  active bleeding with hemodynamic instability, then transfuse regardless of hemoglobin value   At at all times try to transfuse 1 unit prbc as possible with exception of active hemorrhage   Protein caloric malnutrition, mild Stopped TF today given abdominal pain and distension. Will need further workup prior to restarting. -D5W with stoppage of TF - incrase  Hyperglycemia, controlled -continue SSI prn -goal BG 140-180    Best Practice (right click and "Reselect all SmartList Selections" daily)   Diet/type: tubefeeds - on hold since 06/14/22 due to ileus DVT prophylaxis: prophylactic heparin  GI prophylaxis: PPI Lines: Central line - rUE PICC since 06/12/22 Foley:  Yes, and it is still needed Code Status:  full code Last date of multidisciplinary goals of care discussion [updated mother at bedside 11/22 and 06/14/21 and 06/15/22     ATTESTATION & SIGNATURE   The patient [place] is critically ill with multiple organ systems  failure and requires high complexity decision making for assessment and support, frequent evaluation and titration of therapies, application of advanced monitoring technologies and extensive interpretation of multiple databases.   Critical Care Time devoted to patient care services described in this note is  45  Minutes. This time reflects time of care of this signee Dr Brand Males. This critical care time does not reflect procedure time, or teaching time or supervisory time of PA/NP/Med student/Med Resident etc but could involve care discussion time     Dr. Brand Males, M.D., [facility].C.P Pulmonary and Critical Care Medicine Medical Director - [facility] ICU Staff Physician, [place] [facility] and Critical [facility] Pager: [phone], If no answer or between  15:00h - 7:00h: call [phone]  06/15/2022 9:27 AM    LABS    PULMONARY No results for input(s): "PHART", "PCO2ART", "PO2ART", "HCO3", "TCO2", "O2SAT" in the last 168 hours.  Invalid input(s): "PCO2", "PO2"  CBC Recent Labs  Lab 06/13/22 0450 06/14/22 0306 06/15/22 0403  HGB 8.9* 9.1* 10.5*  HCT 29.2* 30.0* 32.6*  WBC 9.5 10.8* 13.1*  PLT 229 216 277    COAGULATION Recent Labs  Lab 06/09/22 1153  INR 1.2    CARDIAC  No results for input(s): "TROPONINI" in the last 168 hours. No results for input(s): "PROBNP" in the last 168 hours.   CHEMISTRY Recent Labs  Lab 06/10/22 0425 06/11/22 0336 06/12/22 0541 06/13/22 0450 06/14/22 0306 06/15/22 0403  NA 142 144 145 147* 151* 153*  K 4.1 3.9 3.7 3.2* 3.3* 4.5  CL 102 105 105 109 110 109  CO2 27 27 26 26 26 27   GLUCOSE 140* 144* 164* 191* 167* 170*  BUN 31* 35* 42* 47* 51* 57*  CREATININE 0.58 0.61 0.75 0.70 0.59 0.89  CALCIUM 9.9 9.7 9.0 8.4* 9.4 9.9  MG 2.5* 2.1  --   --  2.6* 2.6*  PHOS 3.1 3.7  --   --   --  3.6   Estimated Creatinine Clearance: 72.7 mL/min (by C-G formula based on SCr of 0.89  mg/dL).   LIVER Recent Labs  Lab 06/09/22 1153 06/12/22 0541 06/13/22 0450 06/14/22 0306 06/15/22 0403  AST  --  103* 72* 49* 57*  ALT  --  33 23 17 21   ALKPHOS  --  124 111  105 112  BILITOT  --  2.7* 2.7* 2.2* 2.7*  PROT  --  6.5 6.5 6.8 7.2  ALBUMIN  --  2.7* 2.6* 2.7* 3.0*  INR 1.2  --   --   --   --      INFECTIOUS Recent Labs  Lab 06/14/22 1413  LATICACIDVEN 1.3     ENDOCRINE CBG (last 3)  Recent Labs    06/14/22 2320 06/15/22 0320 06/15/22 0756  GLUCAP 139* 157* 129*         IMAGING x48h  - image(s) personally visualized  -   highlighted in bold US Abdomen Complete  Result Date: 06/14/2022 CLINICAL DATA:  51 year old female with cirrhosis. EXAM: ABDOMEN ULTRASOUND COMPLETE COMPARISON:  06/07/2022 CT and prior studies FINDINGS: Gallbladder: Gallbladder sludge is noted with mild gallbladder wall thickening. There is no evidence of cholelithiasis or sonographic Murphy sign. Common bile duct: Diameter: Not visualized. No intrahepatic biliary dilatation noted. Liver: Heterogeneous increased hepatic echogenicity noted. No definite focal hepatic lesion noted. Equivocal nodularity of the hepatic contour or identified. Portal vein is patent on color Doppler imaging with normal direction of blood flow towards the liver. IVC: No abnormality visualized. Pancreas: Visualized portion unremarkable. Spleen: UPPER limits normal in size. Right Kidney: Length: 11.3 cm. Echogenicity within normal limits. No mass or hydronephrosis visualized. Left Kidney: Length: 13.6 cm. Echogenicity within normal limits. No mass or hydronephrosis visualized. Abdominal aorta: No aneurysm visualized. Other findings: Trace perihepatic ascites noted. IMPRESSION: 1. Hepatic steatosis with sonographic findings suggestive of cirrhosis. No focal hepatic lesions. UPPER limits normal spleen size. 2. Trace perihepatic ascites. 3. Mild gallbladder wall thickening without cholelithiasis, which is likely related  to hepatic dysfunction. If there is very strong clinical suspicion for acute cholecystitis, recommend nuclear medicine study. 4. CBD not well visualized. No evidence of intrahepatic biliary dilatation. Electronically Signed   By: Margarette Canada M.D.   On: 06/14/2022 17:46   DG Abd 1 View  Result Date: 06/14/2022 CLINICAL DATA:  Confirm orogastric tube placement. EXAM: ABDOMEN - 1 VIEW COMPARISON:  Radiograph earlier today FINDINGS: Tip of the weighted enteric tube in the right upper quadrant in the region of the distal stomach or proximal duodenum. Persisting gaseous distention of bowel loops centrally. Patchy bibasilar opacities in the included lung bases. IMPRESSION: Tip of the weighted enteric tube in the right upper quadrant in the region of the distal stomach or proximal duodenum. Electronically Signed   By: Keith Rake M.D.   On: 06/14/2022 15:23   DG Abd 1 View  Result Date: 06/14/2022 CLINICAL DATA:  Hepatic cirrhosis with ascites. EXAM: ABDOMEN - 1 VIEW COMPARISON:  Abdominal radiograph dated June 06, 2022 FINDINGS: Gaseous distention of the bowel loops in a nonobstructive pattern. Weighted tip feeding tube with distal tip in the pyloric region. No appreciable intraperitoneal free air. Irregularity of the left superior pubic ramus. IMPRESSION: 1.  Weighted tip feeding tube with distal tip in the pyloric region. 2. Gaseous distention of multiple bowel loops in a nonobstructive pattern suggesting ileus. 3. Irregularity of the left pubic ramus about the pubic symphysis, which may be secondary to motion artifact. Follow-up examination is suggested. Electronically Signed   By: Keane Police D.O.   On: 06/14/2022 12:20

## 2022-06-15 NOTE — Consult Note (Addendum)
[place] Nurse Consult Note: Reason for Consult: Consult requested for sacrum and arms/legs abrasions. Pt is critically ill with multiple systemic factors which can impair healing. Sacrum is intact, however, there is an Unstageable pressure injury to the right buttock, .8X.8cm, 100% yellow slough, small amt tan drainage Pressure Injury POA: No Pt has multiple partial thickness abrasions to arms and legs which are pink, dry and shallow. There are also multiple areas of dark red hematomas and bruises to body. Right upper knee with full thickness wound, 100% yellow slough, 1X1cm Dressing procedure/placement/frequency: Pt is on a low air-loss mattress to reduce pressure. Topical treatment orders provided for bedside nurses to perform as follows to assist with removal of nonviable tissue: Apply Medihoney to right buttock and right knee wounds Q day, then cover with foam dressing. (Change foam dressings Q 3 days or PRN soiling.) Please re-consult if further assistance is needed.  Thank-you,  Julien Girt MSN, [place], [place], [place], [place]

## 2022-06-15 NOTE — Progress Notes (Signed)
SLP Cancellation Note  Patient Details Name: Deanna Mcmillan MRN: [id number] DOB: 07/28/1970   Cancelled treatment:       Reason Eval/Treat Not Completed: Medical issues which prohibited therapy;Fatigue/lethargy limiting ability to participate. Chart reviewed and discussed with RN. Pt now with ileus. RN says not awake enough. WIll hold SLP for now but will continue to follow.    Osie Bond., M.A. [place] Office [phone]  Secure chat preferred  06/15/2022, 10:36 AM

## 2022-06-16 ENCOUNTER — Inpatient Hospital Stay (HOSPITAL_COMMUNITY): Payer: 59

## 2022-06-16 DIAGNOSIS — A419 Sepsis, unspecified organism: Secondary | ICD-10-CM | POA: Diagnosis not present

## 2022-06-16 DIAGNOSIS — R579 Shock, unspecified: Secondary | ICD-10-CM | POA: Diagnosis not present

## 2022-06-16 DIAGNOSIS — A329 Listeriosis, unspecified: Secondary | ICD-10-CM | POA: Diagnosis not present

## 2022-06-16 DIAGNOSIS — K56609 Unspecified intestinal obstruction, unspecified as to partial versus complete obstruction: Secondary | ICD-10-CM

## 2022-06-16 DIAGNOSIS — B9561 Methicillin susceptible Staphylococcus aureus infection as the cause of diseases classified elsewhere: Secondary | ICD-10-CM

## 2022-06-16 DIAGNOSIS — J9601 Acute respiratory failure with hypoxia: Secondary | ICD-10-CM | POA: Diagnosis not present

## 2022-06-16 LAB — GLUCOSE, CAPILLARY
Glucose-Capillary: 130 mg/dL — ABNORMAL HIGH (ref 70–99)
Glucose-Capillary: 130 mg/dL — ABNORMAL HIGH (ref 70–99)
Glucose-Capillary: 133 mg/dL — ABNORMAL HIGH (ref 70–99)
Glucose-Capillary: 153 mg/dL — ABNORMAL HIGH (ref 70–99)
Glucose-Capillary: 181 mg/dL — ABNORMAL HIGH (ref 70–99)
Glucose-Capillary: 194 mg/dL — ABNORMAL HIGH (ref 70–99)
Glucose-Capillary: 220 mg/dL — ABNORMAL HIGH (ref 70–99)

## 2022-06-16 LAB — CBC
HCT: 32.7 % — ABNORMAL LOW (ref 36.0–46.0)
Hemoglobin: 10.2 g/dL — ABNORMAL LOW (ref 12.0–15.0)
MCH: 37.1 pg — ABNORMAL HIGH (ref 26.0–34.0)
MCHC: 31.2 g/dL (ref 30.0–36.0)
MCV: 118.9 fL — ABNORMAL HIGH (ref 80.0–100.0)
Platelets: 361 10*3/uL (ref 150–400)
RBC: 2.75 MIL/uL — ABNORMAL LOW (ref 3.87–5.11)
RDW: 16.9 % — ABNORMAL HIGH (ref 11.5–15.5)
WBC: 20.6 10*3/uL — ABNORMAL HIGH (ref 4.0–10.5)
nRBC: 0.7 % — ABNORMAL HIGH (ref 0.0–0.2)

## 2022-06-16 LAB — COMPREHENSIVE METABOLIC PANEL
ALT: 21 U/L (ref 0–44)
AST: 62 U/L — ABNORMAL HIGH (ref 15–41)
Albumin: 2.9 g/dL — ABNORMAL LOW (ref 3.5–5.0)
Alkaline Phosphatase: 108 U/L (ref 38–126)
Anion gap: 15 (ref 5–15)
BUN: 76 mg/dL — ABNORMAL HIGH (ref 6–20)
CO2: 31 mmol/L (ref 22–32)
Calcium: 9.1 mg/dL (ref 8.9–10.3)
Chloride: 102 mmol/L (ref 98–111)
Creatinine, Ser: 2.3 mg/dL — ABNORMAL HIGH (ref 0.44–1.00)
GFR, Estimated: 25 mL/min — ABNORMAL LOW (ref >60)
Glucose, Bld: 144 mg/dL — ABNORMAL HIGH (ref 70–99)
Potassium: 4.7 mmol/L (ref 3.5–5.1)
Sodium: 148 mmol/L — ABNORMAL HIGH (ref 135–145)
Total Bilirubin: 2.8 mg/dL — ABNORMAL HIGH (ref 0.3–1.2)
Total Protein: 7.1 g/dL (ref 6.5–8.1)

## 2022-06-16 LAB — PROTIME-INR
INR: 1.4 — ABNORMAL HIGH (ref 0.8–1.2)
Prothrombin Time: 17.4 seconds — ABNORMAL HIGH (ref 11.4–15.2)

## 2022-06-16 LAB — BASIC METABOLIC PANEL
Anion gap: 20 — ABNORMAL HIGH (ref 5–15)
BUN: 68 mg/dL — ABNORMAL HIGH (ref 6–20)
CO2: 25 mmol/L (ref 22–32)
Calcium: 7.8 mg/dL — ABNORMAL LOW (ref 8.9–10.3)
Chloride: 98 mmol/L (ref 98–111)
Creatinine, Ser: 2.24 mg/dL — ABNORMAL HIGH (ref 0.44–1.00)
GFR, Estimated: 26 mL/min — ABNORMAL LOW (ref >60)
Glucose, Bld: 208 mg/dL — ABNORMAL HIGH (ref 70–99)
Potassium: 3.8 mmol/L (ref 3.5–5.1)
Sodium: 143 mmol/L (ref 135–145)

## 2022-06-16 LAB — MAGNESIUM: Magnesium: 2.7 mg/dL — ABNORMAL HIGH (ref 1.7–2.4)

## 2022-06-16 LAB — PROCALCITONIN: Procalcitonin: 1.63 ng/mL

## 2022-06-16 LAB — LACTIC ACID, PLASMA
Lactic Acid, Venous: 2.1 mmol/L (ref 0.5–1.9)
Lactic Acid, Venous: 1.9 mmol/L (ref 0.5–1.9)

## 2022-06-16 LAB — PHOSPHORUS: Phosphorus: 6.1 mg/dL — ABNORMAL HIGH (ref 2.5–4.6)

## 2022-06-16 MED ORDER — DIATRIZOATE MEGLUMINE & SODIUM 66-10 % PO SOLN
90.0000 mL | Freq: Once | ORAL | Status: AC
  Administered 2022-06-16: 90 mL via NASOGASTRIC
  Filled 2022-06-16: qty 90

## 2022-06-16 MED ORDER — SODIUM CHLORIDE 0.9 % IV SOLN
2.0000 g | Freq: Four times a day (QID) | INTRAVENOUS | Status: DC
  Administered 2022-06-16 – 2022-06-17 (×4): 2 g via INTRAVENOUS
  Filled 2022-06-16 (×5): qty 2000

## 2022-06-16 MED ORDER — ALBUMIN HUMAN 5 % IV SOLN
25.0000 g | Freq: Once | INTRAVENOUS | Status: AC
  Administered 2022-06-16: 25 g via INTRAVENOUS
  Filled 2022-06-16: qty 500

## 2022-06-16 MED ORDER — ALBUMIN HUMAN 25 % IV SOLN
12.5000 g | Freq: Four times a day (QID) | INTRAVENOUS | Status: AC
  Administered 2022-06-16 – 2022-06-17 (×4): 12.5 g via INTRAVENOUS
  Filled 2022-06-16 (×4): qty 50

## 2022-06-16 NOTE — Progress Notes (Signed)
eLink Physician-Brief Progress Note Patient Name: Deanna Mcmillan DOB: 04-12-1971 MRN: [id number]   Date of Service  06/16/2022  HPI/Events of Note  CT abdomen suggestive of small bowel obstruction, Dr. Annye English paged stat and patient discussed with him; NPO overnight with NG tube to low intermittent suction, Hold rectal Lactulose until patient evaluated by general surgery .  eICU Interventions  See above.        [name] Mairany Bruno 06/16/2022, 12:20 AM

## 2022-06-16 NOTE — Progress Notes (Signed)
PHARMACY NOTE:  ANTIMICROBIAL RENAL DOSAGE ADJUSTMENT  Current antimicrobial regimen includes a mismatch between antimicrobial dosage and estimated renal function.  As per policy approved by the Pharmacy & Therapeutics and Medical Executive Committees, the antimicrobial dosage will be adjusted accordingly.  Current antimicrobial dosage:  ampicillin 2g q4h   Indication: bacteremia  Renal Function:  Estimated Creatinine Clearance: 24.7 mL/min (A) (by C-G formula based on SCr of 2.3 mg/dL (H)).    Antimicrobial dosage has been changed to:  ampicillin 2g q6h   Additional comments:   Thank you for allowing pharmacy to be a part of this patient's care.  Cristela Felt, PharmD, BCPS Clinical Pharmacist 06/16/2022 8:09 AM

## 2022-06-16 NOTE — Progress Notes (Signed)
Patient was transported to and from CT via Ventilator without incident

## 2022-06-16 NOTE — Progress Notes (Signed)
PM rounds - bedside  S: per RN stable. Making urine. No activ bleeding  Looks stable Levophed 41mcg CCS and ID notes reviewed Creat improved in the morning   Continue current course    SIGNATURE    Dr. Brand Males, M.D., F.C.C.P,  Pulmonary and Critical Care Medicine Staff Physician, [place] Director - Interstitial Lung Disease  Program  Medical Director - [facility] [place] at [place], [place], [address]   Pager: [phone], If no answer  -[place] or [place] [phone] Telephone (clinical office): [phone] Telephone (research): [phone]  5:53 PM 06/16/2022

## 2022-06-16 NOTE — Progress Notes (Signed)
[facility]    Date of Admission:  06/02/2022             ID: Deanna Mcmillan is a 51 y.o. 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  Principal Problem:   Acute respiratory failure with hypoxia (HCC) Active Problems:   Pressure injury of skin   Toxic metabolic encephalopathy   On mechanically assisted ventilation (HCC)   Severe sepsis with septic shock (HCC)   Listeriosis   Acute pulmonary edema (HCC)   Septic shock (HCC)   Compromised airway    Subjective: Afebrile Persistent hypotension and pulmonary is adding back pressors; they also want to consider intraabd infection given her cirrhosis  She remains on tubefeed with loose stools  Hypernatremia Serum creatinine stable  Mentation improving after intubation; vent setting minimal  Medications:   Chlorhexidine Gluconate Cloth  6 each Topical Daily   folic acid  1 mg Intravenous Daily   heparin  5,000 Units Subcutaneous Q8H   insulin aspart  0-15 Units Subcutaneous Q4H   lactulose  300 mL Rectal BID   leptospermum manuka honey  1 Application Topical Daily   mouth rinse  15 mL Mouth Rinse Q2H   pantoprazole (PROTONIX) IV  40 mg Intravenous Q24H   sodium chloride flush  10-40 mL Intracatheter Q12H   thiamine (VITAMIN B1) injection  100 mg Intravenous Daily    Objective: Vital signs in last 24 hours: Temp:  [97 F (36.1 C)-99.2 F (37.3 C)] 97.3 F (36.3 C) (11/25 1100) Pulse Rate:  [98-115] 100 (11/25 1100) Resp:  [12-47] 20 (11/25 1100) BP: (66-128)/(48-89) 107/53 (11/25 1100) SpO2:  [93 %-100 %] 99 % (11/25 1100) FiO2 (%):  [40 %-100 %] 50 % (11/25 0833) Weight:  [64.5 kg] 64.5 kg (11/25 0545)  Physical Exam  General/constitutional: chronically ill appearing, spontenaous eyes opening and tracking; follows simple commands HEENT: normocephalic; ett in place CV: rrr no mrg Lungs: normal respiratory effort on vent Abd: soft but mildly distended;  ngt in place with green bilious fluid in container  Ext: trace edema in extremities Skin: multiple echymoses/purpura in extremities Neuro: appears to be alert and tracking MSK: no peripheral joint swelling/warmth    Lab Results Recent Labs    06/15/22 0403 06/15/22 1037 06/15/22 2238 06/16/22 0335 06/16/22 0826  WBC 13.1*  --   --  20.6*  --   HGB 10.5*   < > 10.9* 10.2*  --   HCT 32.6*   < > 32.0* 32.7*  --   NA 153*   < > 148* 148* 143  K 4.5   < > 4.9 4.7 3.8  CL 109   < >  --  102 98  CO2 27   < >  --  31 25  BUN 57*   < >  --  76* 68*  CREATININE 0.89   < >  --  2.30* 2.24*   < > = values in this interval not displayed.   Liver Panel Recent Labs    06/15/22 0403 06/16/22 0335  PROT 7.2 7.1  ALBUMIN 3.0* 2.9*  AST 57* 62*  ALT 21 21  ALKPHOS 112 108  BILITOT 2.7* 2.8*   Sedimentation Rate No results for input(s): "ESRSEDRATE" in the last 72 hours. C-Reactive Protein No results for input(s): "CRP" in the last 72 hours.  Microbiology: reviewed Studies/Results: DG Abd Portable 1V-Small Bowel Obstruction Protocol-initial, 8 hr delay  Result Date: 06/16/2022 CLINICAL  DATA:  Small bowel obstruction. EXAM: PORTABLE ABDOMEN - 1 VIEW COMPARISON:  KUB June 15, 2022. CT the abdomen and pelvis June 15, 2022. FINDINGS: The feeding tube is been advanced and appears to terminate in the distal second portion of the duodenum. The NG tube terminates in the stomach. Dilated loops of small bowel are identified throughout the abdomen consistent with on going small-bowel obstruction. IMPRESSION: 1. Support apparatus as above. 2. Continued small bowel obstruction. Electronically Signed   By: Dorise Bullion III M.D.   On: 06/16/2022 11:52   CT ABDOMEN PELVIS WO CONTRAST  Result Date: 06/15/2022 CLINICAL DATA:  Dilated bowel loops on recent plain film EXAM: CT ABDOMEN AND PELVIS WITHOUT CONTRAST TECHNIQUE: Multidetector CT imaging of the abdomen and pelvis was performed  following the standard protocol without IV contrast. RADIATION DOSE REDUCTION: This exam was performed according to the departmental dose-optimization program which includes automated exposure control, adjustment of the mA and/or kV according to patient size and/or use of iterative reconstruction technique. COMPARISON:  06/01/2022 FINDINGS: Lower chest: Bilateral lower lobe consolidation is noted with moderate left-sided pleural effusion. This is stable from the prior exam. Hepatobiliary: Small hypodensity is noted within the liver near the dome of the liver stable from the prior exam likely representing a small cyst. Mild nodularity of the liver is noted consistent with underlying cirrhosis. Gallbladder is within normal limits. Dependent density is noted within the gallbladder likely representing sludge. No definitive gallstones are seen. Mild perihepatic fluid is seen. Pancreas: Unremarkable. No pancreatic ductal dilatation or surrounding inflammatory changes. Spleen: Normal in size without focal abnormality. Adrenals/Urinary Tract: Adrenal glands are within normal limits. Kidneys are unremarkable. No renal calculi or obstructive changes are noted. The bladder is partially distended. Stomach/Bowel: Rectal tube is noted in place. The colon shows mild diverticular change without obstructive process. Air is noted throughout the transverse colon. The appendix is not visualized consistent with a prior surgical history. Stomach is distended with fluid despite gastric catheters. The weighted feeding catheter extends towards the pylorus. Multiple dilated loops of small bowel are noted extending from the duodenum to the mid to distal ileum. Fecalization of small bowel contents is noted. A transition zone is seen in the anterior abdomen best noted on image number 57 of series 3. Changes may be related to adhesions. The distal most small bowel is within normal limits. Vascular/Lymphatic: No significant vascular findings are  present. No enlarged abdominal or pelvic lymph nodes. Reproductive: Uterus and bilateral adnexa are unremarkable. Other: Mild free fluid is noted within the abdomen likely related to the underlying cirrhosis although some may be reactive to the small bowel abnormality. No free air is seen. Musculoskeletal: Degenerative changes of lumbar spine are noted. IMPRESSION: Multiple dilated loops of small bowel with a transition zone in the mid to distal ileum consistent with a least partial small bowel obstruction. Bibasilar consolidation with left-sided pleural effusions stable from the prior CT. Cirrhotic changes of the liver with ascites. Ascites may also be in part due to the underlying obstructive change. Electronically Signed   By: Inez Catalina M.D.   On: 06/15/2022 23:30   DG CHEST PORT 1 VIEW  Result Date: 06/15/2022 CLINICAL DATA:  Endotracheal tube and feeding tube placement. EXAM: PORTABLE CHEST 1 VIEW COMPARISON:  None Available. FINDINGS: Endotracheal tube with distal tip in the right mainstem bronchus, it need to be retracted 4-5 cm. Feeding tube coursing below the diaphragm with distal tip not included. Heart is normal in size. Low  lung volumes with bibasilar opacities suggesting atelectasis or infiltrate. IMPRESSION: 1. Endotracheal tube with distal tip in the right mainstem bronchus, it need to be retracted 3-4 cm. 2. Low lung volumes with bibasilar opacities suggesting atelectasis or infiltrate. Electronically Signed   By: Keane Police D.O.   On: 06/15/2022 22:13   DG Abd Portable 1V  Result Date: 06/15/2022 CLINICAL DATA:  Check feeding catheter placement EXAM: PORTABLE ABDOMEN - 1 VIEW COMPARISON:  None Available. FINDINGS: Gastric catheter is noted within the stomach. Weighted feeding catheter is noted in the distal stomach near the pyloric channel. Multiple dilated loops of small bowel are noted suspicious for small bowel obstruction. No free air is noted. IMPRESSION: Gastric catheters as  described. Electronically Signed   By: Inez Catalina M.D.   On: 06/15/2022 22:12   DG Abd 1 View  Result Date: 06/15/2022 CLINICAL DATA:  Ileus. EXAM: ABDOMEN - 1 VIEW COMPARISON:  June 14, 2022. FINDINGS: Distal tip of nasogastric tube is seen in expected position of stomach. Distal tip of feeding tube is seen in expected position of distal stomach. Dilated small bowel loops are noted concerning for ileus or possibly distal small bowel obstruction. IMPRESSION: Stable dilated small bowel loops are noted concerning for ileus or possibly distal small bowel obstruction. Electronically Signed   By: Marijo Conception M.D.   On: 06/15/2022 10:22   US Abdomen Complete  Result Date: 06/14/2022 CLINICAL DATA:  51 year old female with cirrhosis. EXAM: ABDOMEN ULTRASOUND COMPLETE COMPARISON:  05/30/2022 CT and prior studies FINDINGS: Gallbladder: Gallbladder sludge is noted with mild gallbladder wall thickening. There is no evidence of cholelithiasis or sonographic Murphy sign. Common bile duct: Diameter: Not visualized. No intrahepatic biliary dilatation noted. Liver: Heterogeneous increased hepatic echogenicity noted. No definite focal hepatic lesion noted. Equivocal nodularity of the hepatic contour or identified. Portal vein is patent on color Doppler imaging with normal direction of blood flow towards the liver. IVC: No abnormality visualized. Pancreas: Visualized portion unremarkable. Spleen: UPPER limits normal in size. Right Kidney: Length: 11.3 cm. Echogenicity within normal limits. No mass or hydronephrosis visualized. Left Kidney: Length: 13.6 cm. Echogenicity within normal limits. No mass or hydronephrosis visualized. Abdominal aorta: No aneurysm visualized. Other findings: Trace perihepatic ascites noted. IMPRESSION: 1. Hepatic steatosis with sonographic findings suggestive of cirrhosis. No focal hepatic lesions. UPPER limits normal spleen size. 2. Trace perihepatic ascites. 3. Mild gallbladder wall  thickening without cholelithiasis, which is likely related to hepatic dysfunction. If there is very strong clinical suspicion for acute cholecystitis, recommend nuclear medicine study. 4. CBD not well visualized. No evidence of intrahepatic biliary dilatation. Electronically Signed   By: Margarette Canada M.D.   On: 06/14/2022 17:46   DG Abd 1 View  Result Date: 06/14/2022 CLINICAL DATA:  Confirm orogastric tube placement. EXAM: ABDOMEN - 1 VIEW COMPARISON:  Radiograph earlier today FINDINGS: Tip of the weighted enteric tube in the right upper quadrant in the region of the distal stomach or proximal duodenum. Persisting gaseous distention of bowel loops centrally. Patchy bibasilar opacities in the included lung bases. IMPRESSION: Tip of the weighted enteric tube in the right upper quadrant in the region of the distal stomach or proximal duodenum. Electronically Signed   By: Keith Rake M.D.   On: 06/14/2022 15:23    Abx: 11/20-c rifaximine 11/08-c amp 11/08-c gentamicin  11/11-22 cefazolin  11/08-11 ceftriaxone 11/07-08 vanc/cefepime/flagyl  Assessment/Plan: Disseminated listeria infection with meningitis and bacteremia = slow improvement on amp/gent. currently day 15 of amp and  gent therapy. Previously planned 2 week treatment although given cirrhosis and tenuous status I would extend abx treatment to 3 weeks 11/08-11/29  -no clear clinical superiority of gent combination over amp monotherapy so if kidney function becomes a concern ok to stop gentamicin (done 11/24 in setting also adding linezolid for ongoing mssa coverage)   Rising wbc/worsening mentation 11/24; see sbo Mssa/msse infection Hypotension. Suspect due to cirrhosis. Wbc slightly up today x1 and mentation remains poor Ultrasound abd only trace ascites and admission ascite analysis not consistent with sbp  11/25 assessment Worsening mentation and rising wbc in setting sbo 11/24 repeat bcx ngtd Abd pelv ct stable basilar  opacity; transition point in gut suggesting of sbo Remains afebrile 5 mcg/min levo in setting repeat intubation/sedation 11/25 respiratory cx in process but so far I do no suspect a new respiratory infection process   -f/u final bcx repeat -continue amp/linezolid; 11/29 plan to resume cefazolin to finish mssa coverage (when listeriosis tx is done) -I agree tee when able -discussed with primary team   Ileus/sbo 11/24 ct with sbo transition point noted vs ileus; general surgery following 11/24 Ngt placed --> bile retrieved; tf hold Will keep abx iv for this reason   Jabier Mutton, [place] [facility] [phone]  pager   [phone] cell   06/16/2022, 12:26 PM

## 2022-06-16 NOTE — Progress Notes (Signed)
NAME:  Deanna Mcmillan, MRN:  [id number], DOB:  07-Oct-1970, LOS: 50 ADMISSION DATE:  06/11/2022, CONSULTATION DATE:  11/7 REFERRING MD:  Lendon Colonel, EDP CHIEF COMPLAINT:  AMS   History of Present Illness:  51 yo woman with hx of recent diagnosis of alcoholic hepatitis (dx 75/10), here with ams, fever.   History from [name].  Patient was in her normal state of health, until suddenly the night PTA she developed a headache.  She went to sleep to rest.  The following day she slept throughout the day, that evening her mother went to wake her and was unable to wake her up, so she called 911.     She has been doing well since her recent hospital discharge, taking all the meds as prescribed, no new meds.  Planned follow up with GI on 11/14.  LE edema had been improving.  No cough, sob no focal symptoms.  No known sick contacts.     In ED, febrile, tachycardic, lethargic, desaturating to 70s.   Intubated by ED physician.    Had been admitted earlier in October and started on course of prenisone for alc hepatitis.    Recently seen at [facility] 10/19: LE Edema. Lasix not helpful at home.  Admitted for diuresis.  Jaundice and abd distension also noted.  Pleural effusion.  GI saw: non enough ascites for tap  Started lasix and spironolactone.  28 day course of prednisolone, miralax, lactulose, miralax. Midodrine TID    In ED got cefepime, LR 1L, flagyl ordered, intubated, started on Propofol, vanc ordered   Pertinent  Medical History  ETOH Cirrhosis BLE edema recently worsening  S/p lap appy 2014    Echo: recent normal 25/8527   Home meds: folic ascid, acetaminohpen, lasix 40bid, lactulose 30 TID protonix, prednisone (04/29/22), prednisolone 04/28/22 three week course, thiamine, simethicone,    Per her mother she had been drinking at least one bottle of wine daily, she isnt clear exactly how much she was drinking but she did also drink [place].  She notes she had wanted to stop recently but  been unable to.  She thinks she was depressed after experiencing perimenopause.  No tobacco.   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 11/18 more awake, following commands 1121 RUE PICC 11/22 extubated to BiPAP 11/23 - 14d of Rx complerted for MSSA/MESSE. For listeria - ID wants to extend amp/gent to 3 weeks 11/8-11/29. Ileus post extubation and NG to LIS . Delia increased. BP sofft an dlevophed and alb Rx.   11/24 -  OG placed yesterday for ileus and bile returns. TF on hold. Lactulose increased yesterday. REamins off vent. Did not need BiPAP. This AM more obtunded but mom thinks was awake at night and is sleeping due to fatigue. Afebrile.  But wBC up 13L. Neg baklance -3L:  OFF PRESSORs = Ammondia up at 72 - [place] mild periphepatic ascites Interim History / Subjective:    11/25 - Now intubated due to obtunded encephaopathy. CTAbc with SBO v  Ileus with transition ponit . CCS consult - no surgery but ok for rectal lactulose w ith continue NG/OG tube to LIS. ID ordered repeat blood culture. Linezolid added.  No ntrition since 06/14/22 Pm. Creat worsening. LEss Ur OP. On levophed 92mcg. Not on sedation. Vent 40%  Objective   Blood pressure 106/61, pulse (!) 102, temperature 98.1 F (36.7 C), resp. rate (!) 24, weight 64.5 kg, SpO2 100 %.    Vent Mode: PRVC FiO2 (%):  [40 %-100 %] 100 % Set Rate:  [20 bmp-22 bmp] 20 bmp Vt Set:  [420 mL-430 mL] 430 mL PEEP:  [5 cmH20]  5 cmH20 Plateau Pressure:  [16 cmH20-22 cmH20] 16 cmH20   Intake/Output Summary (Last 24 hours) at 06/16/2022 0741 Last data filed at 06/16/2022 0600 Gross per 24 hour  Intake 3377.38 ml  Output 4450 ml  Net -1072.62 ml   Filed Weights   06/10/22 0424 06/11/22 0500 06/16/22 0545  Weight: 75.1 kg 73.6 kg 64.5 kg   General: No distress. Looks unwell jauncided Neuro: No sedation - more alert than yesterday. On vent Psych: x Resp:  Barrel Chest - no.  Wheeze - no, Crackles - no,  On vent CVS: Normal heart sounds. Murmurs - no Ext: Stigmata of Connective Tissue Disease - no HEENT: NG tube +. OG tube + ETT + Skin : bruising   Resolved Hospital Problem list   Ileus Hypokalemia Hypophosphatemia Hypernatremia Thrombocytopenia  Assessment & Plan:   Hypotension/Circulatory shock -recurred 06/14/22, and again 06/15/22   11/25 -  on low dose levophed  Plan  - map goal >= 85 fropm 06/16/22 (has AKI) - dc [facility]od[facility] from [facility] due to ileus  - give extra albumin 11/23 and repeat 11/24 and 112/5/23 - holding lasix and aldactone   Encephalopathy -hepatic and metabolic (sepsis and  high Na) - got worse 06/15/22  11/25 - likely got worse due to ileus resulting in reduced absorption of lactulose   Plna  =hold po lactulose till ileus resolved - continuet rectal lactulose 112/24/23   - hold xifaxan - NPO - continue free water - check aBG  Alcoholic cirrhosis - Recently diagnosed cirrhosis during last hospitalization. MELD Na score of 17. Child Pugh Class C. FIB-4 score of 7.46 indicates advance fibrosis. Peritoneal labs admit  not consistent with SBP, however, patient received abx prior to tap.  11/25 - MELD 22 points and worse due to rising creat and bili  Plan - supportive care  ILEUS -r ecurrened 06/14/22 - CT 112/4 : Multiple dilated loops of small bowel with a transition zone in the mid to distal ileum consistent with a least partial small bowel obstruction. 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 PCT c/w localized infection  Plan - get trach aspirate 06/16/22 -Abx: 11/20-c rifaximine 11/08-c ampicllin >> (06/20/22) 11/08-c gentamicin >>11./24/23   11/11-11/22 cefazolin  11/08-11/11 ceftriaxone 11/07-08 vanc/cefepime/flagyl 11/23 - linezold >> (06/20/22) (06/20/22) - cefazolin >>  -appreciate ID assistance - needs TEE at some pioint    Acute hypoxic respiratory failure (CAP versus aspiration pneumonia and Compressive atelectasis )  - Extubated 11/22  - reintubated 06/15/22   06/16/2022 - > does NOT meet criteria for SBT/Extubation in  setting of Acute Respiratory Failure due to encephalopathy and shiock     Plan  - Oral care - HOB > 30 - PRVC - No SBT   Hypernatremia  11/25 - getting better  Plan -  - free water cotninue   AKI (Baseline 0.6- 0.9mg %) - onset 06/15/22   11/25 - wrose despite free water correction of high Na through ileus. Risk factor: cirrhosis, recent gentamicin - ? HRS  Plna  - MAP goal >= 85 - daily albumin  -monitor - if getting worse call renal  Hx SVT Prolonged Qtc, - 560msec 06/04/22   11/25 - needs repeat EKG  Plan  - repeat EKG  - monitor  Anemia - chronic (baseline 8gm-9gm% in oct 2023)- Present on Admit   11/25 - no active bleeding  Plan  - - PRBC for hgb </= 6.9gm%    - exceptions are   -  if ACS susepcted/confirmed then transfuse for hgb </= 8.0gm%,  or    -  active bleeding with hemodynamic instability, then transfuse regardless of hemoglobin value   At at all times try to transfuse 1 unit prbc as possible with exception of active hemorrhage   Protein caloric malnutrition, mild Stopped TF today given abdominal  pain and distension. Will need further workup prior to restarting. -D5W with stoppage of TF - incrase  Hyperglycemia, controlled -continue SSI prn -goal BG 140-180   SEvere p hysical deconditioning  Plan - She will need aggressive PT/OT (likely CIR) for strength training.   Best Practice (right click and "Reselect all SmartList Selections" daily)   Diet/type: tubefeeds - on hold since 06/14/22 due to ileus -> aim for TPN 06/07/22 possibly DVT prophylaxis: prophylactic heparin  GI prophylaxis: PPI Lines: Central line - rUE PICC since 06/12/22 Foley:  Yes, and it is still needed Code Status:  full code Last date of multidisciplinary goals of care discussion [updated mother at bedside 11/22 and 06/14/21 and 06/15/22, 06/16/22 at bedside      [place]   The patient LOLETA FROMMELT is critically ill with multiple organ systems failure and requires high complexity decision making for assessment and support, frequent evaluation and titration of therapies, application of advanced monitoring technologies and extensive interpretation of multiple databases.   Critical Care Time devoted to patient care services described in this note is  40  Minutes. This time reflects time of care of this signee Dr Brand Males. This critical care time does not reflect procedure time, or teaching time or supervisory time of PA/NP/Med student/Med Resident etc but could involve care discussion time     Dr. Brand Males, M.D., [facility].C.P Pulmonary and Critical Care Medicine Medical Director - [facility] ICU Staff Physician, [facility] Pager: [phone], If no answer or between  15:00h - 7:00h: call [phone]  06/16/2022 7:41 AM     LABS    PULMONARY Recent Labs  Lab 06/15/22 1037 06/15/22 2107 06/15/22 2238  PHART 7.557* 7.564* 7.502*  PCO2ART 35.2 38.2 43.8  PO2ART 67* 91 69*  HCO3 31.3* 34.7* 34.5*   TCO2 32 36* 36*  O2SAT 95 98 95    CBC Recent Labs  Lab 06/14/22 0306 06/15/22 0403 06/15/22 1037 06/15/22 2107 06/15/22 2238 06/16/22 0335  HGB 9.1* 10.5*   < > 10.9* 10.9* 10.2*  HCT 30.0* 32.6*   < > 32.0* 32.0* 32.7*  WBC 10.8* 13.1*  --   --   --  20.6*  PLT 216 277  --   --   --  361   < > = values in this interval not displayed.    COAGULATION Recent Labs  Lab 06/09/22 1153 06/16/22 0335  INR 1.2 1.4*    CARDIAC  No results for input(s): "TROPONINI" in the last 168 hours. No results for input(s): "PROBNP" in the last 168 hours.   CHEMISTRY Recent Labs  Lab 06/10/22 0425 06/11/22 0336 06/12/22 0541 06/13/22 0450 06/14/22 0306 06/15/22 0403 06/15/22 1037 06/15/22 2052 06/15/22 2107 06/15/22 2238 06/16/22 0335  NA 142 144   < > 147* 151* 153* 151* 150* 149* 148* 148*  K 4.1 3.9   < > 3.2* 3.3* 4.5 5.0 4.6 4.6 4.9 4.7  CL 102 105   < > 109 110 109  --  104  --   --  102  CO2 27 27   < > 26 26 27   --  31  --   --  31  GLUCOSE 140* 144*   < > 191* 167* 170*  --  129*  --   --  144*  BUN 31* 35*   < > 47* 51* 57*  --  70*  --   --  76*  CREATININE 0.58 0.61   < > 0.70 0.59 0.89  --  1.88*  --   --  2.30*  CALCIUM 9.9 9.7   < > 8.4* 9.4 9.9  --  9.3  --   --  9.1  MG 2.5* 2.1  --   --  2.6* 2.6*  --  3.0*  --   --  2.7*  PHOS 3.1 3.7  --   --   --  3.6  --   --   --   --  6.1*   < > = values in this interval not displayed.   Estimated Creatinine Clearance: 24.7 mL/min (A) (by C-G formula based on SCr of 2.3 mg/dL (H)).   LIVER Recent Labs  Lab 06/09/22 1153 06/12/22 0541 06/13/22 0450 06/14/22 0306 06/15/22 0403 06/16/22 0335  AST  --  103* 72* 49* 57* 62*  ALT  --  33 23 17 21 21   ALKPHOS  --  124 111 105 112 108  BILITOT  --  2.7* 2.7* 2.2* 2.7* 2.8*  PROT  --  6.5 6.5 6.8 7.2 7.1  ALBUMIN  --  2.7* 2.6* 2.7* 3.0* 2.9*  INR 1.2  --   --   --   --  1.4*     INFECTIOUS Recent Labs  Lab 06/14/22 1413 06/15/22 1054 06/16/22 0335   LATICACIDVEN 1.3 1.7 2.1*  PROCALCITON  --  0.55 1.63     ENDOCRINE CBG (last 3)  Recent Labs    06/15/22 1612 06/15/22 1953 06/15/22 2349  GLUCAP 207* 135* 220*         IMAGING x48h  - image(s) personally visualized  -   highlighted in bold CT ABDOMEN PELVIS WO CONTRAST  Result Date: 06/15/2022 CLINICAL DATA:  Dilated bowel loops on recent plain film EXAM: CT ABDOMEN AND PELVIS WITHOUT CONTRAST TECHNIQUE: Multidetector CT imaging of the abdomen and pelvis was performed following the standard protocol without IV contrast. RADIATION DOSE REDUCTION: This exam was performed according to the departmental dose-optimization program which includes automated exposure control, adjustment of the mA and/or kV according to patient size and/or use of iterative reconstruction technique. COMPARISON:  06/04/2022 FINDINGS: Lower chest: Bilateral lower lobe consolidation is noted with  moderate left-sided pleural effusion. This is stable from the prior exam. Hepatobiliary: Small hypodensity is noted within the liver near the dome of the liver stable from the prior exam likely representing a small cyst. Mild nodularity of the liver is noted consistent with underlying cirrhosis. Gallbladder is within normal limits. Dependent density is noted within the gallbladder likely representing sludge. No definitive gallstones are seen. Mild perihepatic fluid is seen. Pancreas: Unremarkable. No pancreatic ductal dilatation or surrounding inflammatory changes. Spleen: Normal in size without focal abnormality. Adrenals/Urinary Tract: Adrenal glands are within normal limits. Kidneys are unremarkable. No renal calculi or obstructive changes are noted. The bladder is partially distended. Stomach/Bowel: Rectal tube is noted in place. The colon shows mild diverticular change without obstructive process. Air is noted throughout the transverse colon. The appendix is not visualized consistent with a prior surgical history. Stomach  is distended with fluid despite gastric catheters. The weighted feeding catheter extends towards the pylorus. Multiple dilated loops of small bowel are noted extending from the duodenum to the mid to distal ileum. Fecalization of small bowel contents is noted. A transition zone is seen in the anterior abdomen best noted on image number 57 of series 3. Changes may be related to adhesions. The distal most small bowel is within normal limits. Vascular/Lymphatic: No significant vascular findings are present. No enlarged abdominal or pelvic lymph nodes. Reproductive: Uterus and bilateral adnexa are unremarkable. Other: Mild free fluid is noted within the abdomen likely related to the underlying cirrhosis although some may be reactive to the small bowel abnormality. No free air is seen. Musculoskeletal: Degenerative changes of lumbar spine are noted. IMPRESSION: Multiple dilated loops of small bowel with a transition zone in the mid to distal ileum consistent with a least partial small bowel obstruction. Bibasilar consolidation with left-sided pleural effusions stable from the prior CT. Cirrhotic changes of the liver with ascites. Ascites may also be in part due to the underlying obstructive change. Electronically Signed   By: Inez Catalina M.D.   On: 06/15/2022 23:30   DG CHEST PORT 1 VIEW  Result Date: 06/15/2022 CLINICAL DATA:  Endotracheal tube and feeding tube placement. EXAM: PORTABLE CHEST 1 VIEW COMPARISON:  None Available. FINDINGS: Endotracheal tube with distal tip in the right mainstem bronchus, it need to be retracted 4-5 cm. Feeding tube coursing below the diaphragm with distal tip not included. Heart is normal in size. Low lung volumes with bibasilar opacities suggesting atelectasis or infiltrate. IMPRESSION: 1. Endotracheal tube with distal tip in the right mainstem bronchus, it need to be retracted 3-4 cm. 2. Low lung volumes with bibasilar opacities suggesting atelectasis or infiltrate. Electronically  Signed   By: Keane Police D.O.   On: 06/15/2022 22:13   DG Abd Portable 1V  Result Date: 06/15/2022 CLINICAL DATA:  Check feeding catheter placement EXAM: PORTABLE ABDOMEN - 1 VIEW COMPARISON:  None Available. FINDINGS: Gastric catheter is noted within the stomach. Weighted feeding catheter is noted in the distal stomach near the pyloric channel. Multiple dilated loops of small bowel are noted suspicious for small bowel obstruction. No free air is noted. IMPRESSION: Gastric catheters as described. Electronically Signed   By: Inez Catalina M.D.   On: 06/15/2022 22:12   DG Abd 1 View  Result Date: 06/15/2022 CLINICAL DATA:  Ileus. EXAM: ABDOMEN - 1 VIEW COMPARISON:  June 14, 2022. FINDINGS: Distal tip of nasogastric tube is seen in expected position of stomach. Distal tip of feeding tube is seen in expected position of  distal stomach. Dilated small bowel loops are noted concerning for ileus or possibly distal small bowel obstruction. IMPRESSION: Stable dilated small bowel loops are noted concerning for ileus or possibly distal small bowel obstruction. Electronically Signed   By: Marijo Conception M.D.   On: 06/15/2022 10:22   US Abdomen Complete  Result Date: 06/14/2022 CLINICAL DATA:  51 year old female with cirrhosis. EXAM: ABDOMEN ULTRASOUND COMPLETE COMPARISON:  05/28/2022 CT and prior studies FINDINGS: Gallbladder: Gallbladder sludge is noted with mild gallbladder wall thickening. There is no evidence of cholelithiasis or sonographic Murphy sign. Common bile duct: Diameter: Not visualized. No intrahepatic biliary dilatation noted. Liver: Heterogeneous increased hepatic echogenicity noted. No definite focal hepatic lesion noted. Equivocal nodularity of the hepatic contour or identified. Portal vein is patent on color Doppler imaging with normal direction of blood flow towards the liver. IVC: No abnormality visualized. Pancreas: Visualized portion unremarkable. Spleen: UPPER limits normal in size.  Right Kidney: Length: 11.3 cm. Echogenicity within normal limits. No mass or hydronephrosis visualized. Left Kidney: Length: 13.6 cm. Echogenicity within normal limits. No mass or hydronephrosis visualized. Abdominal aorta: No aneurysm visualized. Other findings: Trace perihepatic ascites noted. IMPRESSION: 1. Hepatic steatosis with sonographic findings suggestive of cirrhosis. No focal hepatic lesions. UPPER limits normal spleen size. 2. Trace perihepatic ascites. 3. Mild gallbladder wall thickening without cholelithiasis, which is likely related to hepatic dysfunction. If there is very strong clinical suspicion for acute cholecystitis, recommend nuclear medicine study. 4. CBD not well visualized. No evidence of intrahepatic biliary dilatation. Electronically Signed   By: Margarette Canada M.D.   On: 06/14/2022 17:46   DG Abd 1 View  Result Date: 06/14/2022 CLINICAL DATA:  Confirm orogastric tube placement. EXAM: ABDOMEN - 1 VIEW COMPARISON:  Radiograph earlier today FINDINGS: Tip of the weighted enteric tube in the right upper quadrant in the region of the distal stomach or proximal duodenum. Persisting gaseous distention of bowel loops centrally. Patchy bibasilar opacities in the included lung bases. IMPRESSION: Tip of the weighted enteric tube in the right upper quadrant in the region of the distal stomach or proximal duodenum. Electronically Signed   By: Keith Rake M.D.   On: 06/14/2022 15:23   DG Abd 1 View  Result Date: 06/14/2022 CLINICAL DATA:  Hepatic cirrhosis with ascites. EXAM: ABDOMEN - 1 VIEW COMPARISON:  Abdominal radiograph dated June 06, 2022 FINDINGS: Gaseous distention of the bowel loops in a nonobstructive pattern. Weighted tip feeding tube with distal tip in the pyloric region. No appreciable intraperitoneal free air. Irregularity of the left superior pubic ramus. IMPRESSION: 1.  Weighted tip feeding tube with distal tip in the pyloric region. 2. Gaseous distention of multiple  bowel loops in a nonobstructive pattern suggesting ileus. 3. Irregularity of the left pubic ramus about the pubic symphysis, which may be secondary to motion artifact. Follow-up examination is suggested. Electronically Signed   By: Keane Police D.O.   On: 06/14/2022 12:20

## 2022-06-16 NOTE — Consult Note (Signed)
CCReason for consult: SBO Requesting physician: Dr. Trevor Mace MD  HPI: Deanna Mcmillan is an 51 y.o. female critically ill with hx alcohol hepatitis admitted with hepatic encephalopathy. We were asked to see as she was found to be distended and suspected pSBO. Previously today receiving PO lactulose and free water boluses  Mother is at bedside. Reports she has been reasonably healthy until this admission. Reports hx of lap appy 2014 otherwise no other abdominal surgical hx.   Per CCM she has been managed for a few days now with 'ileus' and has had OG in place for this purpose  No past medical history on file.  Past Surgical History:  Procedure Laterality Date   KNEE ARTHROSCOPY W/ ACL RECONSTRUCTION Left 1994/5   Dr. Weston Anna   LAPAROSCOPIC APPENDECTOMY N/A 04/11/2013   Procedure: APPENDECTOMY LAPAROSCOPIC;  Surgeon: Stark Klein, MD;  Location: [facility];  Service: General;  Laterality: N/A;   [place]   3 extractions    No family history on file.  Social:  reports that she has never smoked. She does not have any smokeless tobacco history on file. She reports current alcohol use. She reports that she does not use drugs.  Allergies: No Known Allergies  Medications: I have reviewed the patient's current medications.  Results for orders placed or performed during the hospital encounter of 06/03/2022 (from the past 48 hour(s))  CBC with Differential/Platelet     Status: Abnormal   Collection Time: 06/14/22  3:06 AM  Result Value Ref Range   WBC 10.8 (H) 4.0 - 10.5 K/uL   RBC 2.48 (L) 3.87 - 5.11 MIL/uL   Hemoglobin 9.1 (L) 12.0 - 15.0 g/dL   HCT 30.0 (L) 36.0 - 46.0 %   MCV 121.0 (H) 80.0 - 100.0 fL   MCH 36.7 (H) 26.0 - 34.0 pg   MCHC 30.3 30.0 - 36.0 g/dL   RDW 17.4 (H) 11.5 - 15.5 %   Platelets 216 150 - 400 K/uL   nRBC 0.0 0.0 - 0.2 %   Neutrophils Relative % 75 %   Neutro Abs 8.1 (H) 1.7 - 7.7 K/uL   Lymphocytes Relative 12 %   Lymphs Abs  1.3 0.7 - 4.0 K/uL   Monocytes Relative 11 %   Monocytes Absolute 1.1 (H) 0.1 - 1.0 K/uL   Eosinophils Relative 1 %   Eosinophils Absolute 0.1 0.0 - 0.5 K/uL   Basophils Relative 0 %   Basophils Absolute 0.0 0.0 - 0.1 K/uL   Immature Granulocytes 1 %   Abs Immature Granulocytes 0.11 (H) 0.00 - 0.07 K/uL    Comment: Performed at [facility], [address] [place], [place] [address]  Comprehensive metabolic panel     Status: Abnormal   Collection Time: 06/14/22  3:06 AM  Result Value Ref Range   Sodium 151 (H) 135 - 145 mmol/L   Potassium 3.3 (L) 3.5 - 5.1 mmol/L   Chloride 110 98 - 111 mmol/L   CO2 26 22 - 32 mmol/L   Glucose, Bld 167 (H) 70 - 99 mg/dL    Comment: Glucose reference range applies only to samples taken after fasting for at least 8 hours.   BUN 51 (H) 6 - 20 mg/dL   Creatinine, Ser 0.59 0.44 - 1.00 mg/dL   Calcium 9.4 8.9 - 10.3 mg/dL   Total Protein 6.8 6.5 - 8.1 g/dL   Albumin 2.7 (L) 3.5 - 5.0 g/dL   AST 49 (  H) 15 - 41 U/L   ALT 17 0 - 44 U/L   Alkaline Phosphatase 105 38 - 126 U/L   Total Bilirubin 2.2 (H) 0.3 - 1.2 mg/dL   GFR, Estimated >60 >60 mL/min    Comment: (NOTE) Calculated using the CKD-EPI Creatinine Equation (2021)    Anion gap 15 5 - 15    Comment: Performed at [facility] [address] [place], [place] [address]  Magnesium     Status: Abnormal   Collection Time: 06/14/22  3:06 AM  Result Value Ref Range   Magnesium 2.6 (H) 1.7 - 2.4 mg/dL    Comment: Performed at [facility] [address], [place], [place] [address]  Glucose, capillary     Status: Abnormal   Collection Time: 06/14/22  3:06 AM  Result Value Ref Range   Glucose-Capillary 148 (H) 70 - 99 mg/dL    Comment: Glucose reference range applies only to samples taken after fasting for at least 8 hours.  Glucose, capillary     Status: Abnormal   Collection Time: 06/14/22  7:34 AM  Result Value Ref Range   Glucose-Capillary 125 (H) 70 - 99 mg/dL    Comment:  Glucose reference range applies only to samples taken after fasting for at least 8 hours.  Glucose, capillary     Status: Abnormal   Collection Time: 06/14/22  1:44 PM  Result Value Ref Range   Glucose-Capillary 141 (H) 70 - 99 mg/dL    Comment: Glucose reference range applies only to samples taken after fasting for at least 8 hours.  Lactic acid, plasma     Status: None   Collection Time: 06/14/22  2:13 PM  Result Value Ref Range   Lactic Acid, Venous 1.3 0.5 - 1.9 mmol/L    Comment: Performed at [facility] [address] [place], [place] [address]  Ammonia     Status: Abnormal   Collection Time: 06/14/22  2:13 PM  Result Value Ref Range   Ammonia 72 (H) 9 - 35 umol/L    Comment: Performed at [facility], [place] [address], [place], [place] [address]  Amylase     Status: None   Collection Time: 06/14/22  2:52 PM  Result Value Ref Range   Amylase 64 28 - 100 U/L    Comment: Performed at [facility] [address], [place], [place] [address]  Lipase, blood     Status: Abnormal   Collection Time: 06/14/22  2:52 PM  Result Value Ref Range   Lipase 73 (H) 11 - 51 U/L    Comment: Performed at [facility] [address] [place], [place] [address]  Glucose, capillary     Status: Abnormal   Collection Time: 06/14/22  3:39 PM  Result Value Ref Range   Glucose-Capillary 137 (H) 70 - 99 mg/dL    Comment: Glucose reference range applies only to samples taken after fasting for at least 8 hours.  Glucose, capillary     Status: Abnormal   Collection Time: 06/14/22  7:46 PM  Result Value Ref Range   Glucose-Capillary 146 (H) 70 - 99 mg/dL    Comment: Glucose reference range applies only to samples taken after fasting for at least 8 hours.  Glucose, capillary     Status: Abnormal   Collection Time: 06/14/22 11:20 PM  Result Value Ref Range   Glucose-Capillary 139 (H) 70 - 99 mg/dL    Comment:  Glucose reference range applies only to samples taken after fasting for  at least 8 hours.  Glucose, capillary     Status: Abnormal   Collection Time: 06/15/22  3:20 AM  Result Value Ref Range   Glucose-Capillary 157 (H) 70 - 99 mg/dL    Comment: Glucose reference range applies only to samples taken after fasting for at least 8 hours.  CBC with Differential/Platelet     Status: Abnormal   Collection Time: 06/15/22  4:03 AM  Result Value Ref Range   WBC 13.1 (H) 4.0 - 10.5 K/uL   RBC 2.74 (L) 3.87 - 5.11 MIL/uL   Hemoglobin 10.5 (L) 12.0 - 15.0 g/dL   HCT 32.6 (L) 36.0 - 46.0 %   MCV 119.0 (H) 80.0 - 100.0 fL   MCH 38.3 (H) 26.0 - 34.0 pg   MCHC 32.2 30.0 - 36.0 g/dL   RDW 17.4 (H) 11.5 - 15.5 %   Platelets 277 150 - 400 K/uL   nRBC 0.5 (H) 0.0 - 0.2 %   Neutrophils Relative % 75 %   Neutro Abs 9.6 (H) 1.7 - 7.7 K/uL   Lymphocytes Relative 14 %   Lymphs Abs 1.9 0.7 - 4.0 K/uL   Monocytes Relative 10 %   Monocytes Absolute 1.3 (H) 0.1 - 1.0 K/uL   Eosinophils Relative 0 %   Eosinophils Absolute 0.1 0.0 - 0.5 K/uL   Basophils Relative 0 %   Basophils Absolute 0.0 0.0 - 0.1 K/uL   Immature Granulocytes 1 %   Abs Immature Granulocytes 0.18 (H) 0.00 - 0.07 K/uL    Comment: Performed at [facility], [address], [place], [place] [address]  Comprehensive metabolic panel     Status: Abnormal   Collection Time: 06/15/22  4:03 AM  Result Value Ref Range   Sodium 153 (H) 135 - 145 mmol/L   Potassium 4.5 3.5 - 5.1 mmol/L   Chloride 109 98 - 111 mmol/L   CO2 27 22 - 32 mmol/L   Glucose, Bld 170 (H) 70 - 99 mg/dL    Comment: Glucose reference range applies only to samples taken after fasting for at least 8 hours.   BUN 57 (H) 6 - 20 mg/dL   Creatinine, Ser 0.89 0.44 - 1.00 mg/dL   Calcium 9.9 8.9 - 10.3 mg/dL   Total Protein 7.2 6.5 - 8.1 g/dL   Albumin 3.0 (L) 3.5 - 5.0 g/dL   AST 57 (H) 15 - 41 U/L   ALT 21 0 - 44 U/L   Alkaline Phosphatase 112 38 - 126 U/L   Total Bilirubin 2.7 (H) 0.3 - 1.2 mg/dL   GFR, Estimated >60 >60 mL/min     Comment: (NOTE) Calculated using the CKD-EPI Creatinine Equation (2021)    Anion gap 17 (H) 5 - 15    Comment: Performed at [facility], [place] [address] [place], [place] [address]  Magnesium     Status: Abnormal   Collection Time: 06/15/22  4:03 AM  Result Value Ref Range   Magnesium 2.6 (H) 1.7 - 2.4 mg/dL    Comment: Performed at [facility] [address] [place], [place] [address]  Phosphorus     Status: None   Collection Time: 06/15/22  4:03 AM  Result Value Ref Range   Phosphorus 3.6 2.5 - 4.6 mg/dL    Comment: Performed at [facility] [address] [place], [place] [address]  Glucose, capillary  Status: Abnormal   Collection Time: 06/15/22  7:56 AM  Result Value Ref Range   Glucose-Capillary 129 (H) 70 - 99 mg/dL    Comment: Glucose reference range applies only to samples taken after fasting for at least 8 hours.  I-STAT 7, (LYTES, BLD GAS, ICA, H+H)     Status: Abnormal   Collection Time: 06/15/22 10:37 AM  Result Value Ref Range   pH, Arterial 7.557 (H) 7.35 - 7.45   pCO2 arterial 35.2 32 - 48 mmHg   pO2, Arterial 67 (L) 83 - 108 mmHg   Bicarbonate 31.3 (H) 20.0 - 28.0 mmol/L   TCO2 32 22 - 32 mmol/L   O2 Saturation 95 %   Acid-Base Excess 9.0 (H) 0.0 - 2.0 mmol/L   Sodium 151 (H) 135 - 145 mmol/L   Potassium 5.0 3.5 - 5.1 mmol/L   Calcium, Ion 1.18 1.15 - 1.40 mmol/L   HCT 32.0 (L) 36.0 - 46.0 %   Hemoglobin 10.9 (L) 12.0 - 15.0 g/dL   Sample type ARTERIAL   Procalcitonin - Baseline     Status: None   Collection Time: 06/15/22 10:54 AM  Result Value Ref Range   Procalcitonin 0.55 ng/mL    Comment:        Interpretation: PCT > 0.5 ng/mL and <= 2 ng/mL: Systemic infection (sepsis) is possible, but other conditions are known to elevate PCT as well. (NOTE)       Sepsis PCT Algorithm           Lower Respiratory Tract                                      Infection PCT Algorithm    ----------------------------      ----------------------------         PCT < 0.25 ng/mL                PCT < 0.10 ng/mL          Strongly encourage             Strongly discourage   discontinuation of antibiotics    initiation of antibiotics    ----------------------------     -----------------------------       PCT 0.25 - 0.50 ng/mL            PCT 0.10 - 0.25 ng/mL               OR       >80% decrease in PCT            Discourage initiation of                                            antibiotics      Encourage discontinuation           of antibiotics    ----------------------------     -----------------------------         PCT >= 0.50 ng/mL              PCT 0.26 - 0.50 ng/mL                AND       <80% decrease in PCT             Encourage initiation of  antibiotics       Encourage continuation           of antibiotics    ----------------------------     -----------------------------        PCT >= 0.50 ng/mL                  PCT > 0.50 ng/mL               AND         increase in PCT                  Strongly encourage                                      initiation of antibiotics    Strongly encourage escalation           of antibiotics                                     -----------------------------                                           PCT <= 0.25 ng/mL                                                 OR                                        > 80% decrease in PCT                                      Discontinue / Do not initiate                                             antibiotics  Performed at [facility], [address], [place], [place] [address]   Lactic acid, plasma     Status: None   Collection Time: 06/15/22 10:54 AM  Result Value Ref Range   Lactic Acid, Venous 1.7 0.5 - 1.9 mmol/L    Comment: Performed at [facility] [address], [place], [place] [address]  Glucose, capillary     Status: Abnormal   Collection Time: 06/15/22  11:35 AM  Result Value Ref Range   Glucose-Capillary 146 (H) 70 - 99 mg/dL    Comment: Glucose reference range applies only to samples taken after fasting for at least 8 hours.  Glucose, capillary     Status: Abnormal   Collection Time: 06/15/22  4:12 PM  Result Value Ref Range   Glucose-Capillary 207 (H) 70 - 99 mg/dL    Comment: Glucose reference range applies only to samples taken after fasting for at least 8 hours.  Glucose, capillary     Status:  Abnormal   Collection Time: 06/15/22  7:53 PM  Result Value Ref Range   Glucose-Capillary 135 (H) 70 - 99 mg/dL    Comment: Glucose reference range applies only to samples taken after fasting for at least 8 hours.  Basic metabolic panel     Status: Abnormal   Collection Time: 06/15/22  8:52 PM  Result Value Ref Range   Sodium 150 (H) 135 - 145 mmol/L   Potassium 4.6 3.5 - 5.1 mmol/L   Chloride 104 98 - 111 mmol/L   CO2 31 22 - 32 mmol/L   Glucose, Bld 129 (H) 70 - 99 mg/dL    Comment: Glucose reference range applies only to samples taken after fasting for at least 8 hours.   BUN 70 (H) 6 - 20 mg/dL   Creatinine, Ser 1.88 (H) 0.44 - 1.00 mg/dL   Calcium 9.3 8.9 - 10.3 mg/dL   GFR, Estimated 32 (L) >60 mL/min    Comment: (NOTE) Calculated using the CKD-EPI Creatinine Equation (2021)    Anion gap 15 5 - 15    Comment: Performed at [facility] [address] [place], [place] [address]  Magnesium     Status: Abnormal   Collection Time: 06/15/22  8:52 PM  Result Value Ref Range   Magnesium 3.0 (H) 1.7 - 2.4 mg/dL    Comment: Performed at [facility] [address], [place], [place] [address]  Ammonia     Status: Abnormal   Collection Time: 06/15/22  8:52 PM  Result Value Ref Range   Ammonia 108 (H) 9 - 35 umol/L    Comment: Performed at [facility], [place] [address], [place], [place] [address]  I-STAT 7, (LYTES, BLD GAS, ICA, H+H)     Status: Abnormal   Collection Time: 06/15/22  9:07 PM  Result Value Ref Range    pH, Arterial 7.564 (H) 7.35 - 7.45   pCO2 arterial 38.2 32 - 48 mmHg   pO2, Arterial 91 83 - 108 mmHg   Bicarbonate 34.7 (H) 20.0 - 28.0 mmol/L   TCO2 36 (H) 22 - 32 mmol/L   O2 Saturation 98 %   Acid-Base Excess 11.0 (H) 0.0 - 2.0 mmol/L   Sodium 149 (H) 135 - 145 mmol/L   Potassium 4.6 3.5 - 5.1 mmol/L   Calcium, Ion 1.13 (L) 1.15 - 1.40 mmol/L   HCT 32.0 (L) 36.0 - 46.0 %   Hemoglobin 10.9 (L) 12.0 - 15.0 g/dL   Patient temperature 97.6 F    Sample type ARTERIAL   I-STAT 7, (LYTES, BLD GAS, ICA, H+H)     Status: Abnormal   Collection Time: 06/15/22 10:38 PM  Result Value Ref Range   pH, Arterial 7.502 (H) 7.35 - 7.45   pCO2 arterial 43.8 32 - 48 mmHg   pO2, Arterial 69 (L) 83 - 108 mmHg   Bicarbonate 34.5 (H) 20.0 - 28.0 mmol/L   TCO2 36 (H) 22 - 32 mmol/L   O2 Saturation 95 %   Acid-Base Excess 10.0 (H) 0.0 - 2.0 mmol/L   Sodium 148 (H) 135 - 145 mmol/L   Potassium 4.9 3.5 - 5.1 mmol/L   Calcium, Ion 1.12 (L) 1.15 - 1.40 mmol/L   HCT 32.0 (L) 36.0 - 46.0 %   Hemoglobin 10.9 (L) 12.0 - 15.0 g/dL   Patient temperature 97.6 F    Collection site RADIAL, ALLEN'S TEST ACCEPTABLE    Drawn by Operator    Sample type  ARTERIAL   Glucose, capillary     Status: Abnormal   Collection Time: 06/15/22 11:49 PM  Result Value Ref Range   Glucose-Capillary 220 (H) 70 - 99 mg/dL    Comment: Glucose reference range applies only to samples taken after fasting for at least 8 hours.    CT ABDOMEN PELVIS WO CONTRAST  Result Date: 06/15/2022 CLINICAL DATA:  Dilated bowel loops on recent plain film EXAM: CT ABDOMEN AND PELVIS WITHOUT CONTRAST TECHNIQUE: Multidetector CT imaging of the abdomen and pelvis was performed following the standard protocol without IV contrast. RADIATION DOSE REDUCTION: This exam was performed according to the departmental dose-optimization program which includes automated exposure control, adjustment of the mA and/or kV according to patient size and/or use of  iterative reconstruction technique. COMPARISON:  06/08/2022 FINDINGS: Lower chest: Bilateral lower lobe consolidation is noted with moderate left-sided pleural effusion. This is stable from the prior exam. Hepatobiliary: Small hypodensity is noted within the liver near the dome of the liver stable from the prior exam likely representing a small cyst. Mild nodularity of the liver is noted consistent with underlying cirrhosis. Gallbladder is within normal limits. Dependent density is noted within the gallbladder likely representing sludge. No definitive gallstones are seen. Mild perihepatic fluid is seen. Pancreas: Unremarkable. No pancreatic ductal dilatation or surrounding inflammatory changes. Spleen: Normal in size without focal abnormality. Adrenals/Urinary Tract: Adrenal glands are within normal limits. Kidneys are unremarkable. No renal calculi or obstructive changes are noted. The bladder is partially distended. Stomach/Bowel: Rectal tube is noted in place. The colon shows mild diverticular change without obstructive process. Air is noted throughout the transverse colon. The appendix is not visualized consistent with a prior surgical history. Stomach is distended with fluid despite gastric catheters. The weighted feeding catheter extends towards the pylorus. Multiple dilated loops of small bowel are noted extending from the duodenum to the mid to distal ileum. Fecalization of small bowel contents is noted. A transition zone is seen in the anterior abdomen best noted on image number 57 of series 3. Changes may be related to adhesions. The distal most small bowel is within normal limits. Vascular/Lymphatic: No significant vascular findings are present. No enlarged abdominal or pelvic lymph nodes. Reproductive: Uterus and bilateral adnexa are unremarkable. Other: Mild free fluid is noted within the abdomen likely related to the underlying cirrhosis although some may be reactive to the small bowel abnormality. No  free air is seen. Musculoskeletal: Degenerative changes of lumbar spine are noted. IMPRESSION: Multiple dilated loops of small bowel with a transition zone in the mid to distal ileum consistent with a least partial small bowel obstruction. Bibasilar consolidation with left-sided pleural effusions stable from the prior CT. Cirrhotic changes of the liver with ascites. Ascites may also be in part due to the underlying obstructive change. Electronically Signed   By: Inez Catalina M.D.   On: 06/15/2022 23:30   DG CHEST PORT 1 VIEW  Result Date: 06/15/2022 CLINICAL DATA:  Endotracheal tube and feeding tube placement. EXAM: PORTABLE CHEST 1 VIEW COMPARISON:  None Available. FINDINGS: Endotracheal tube with distal tip in the right mainstem bronchus, it need to be retracted 4-5 cm. Feeding tube coursing below the diaphragm with distal tip not included. Heart is normal in size. Low lung volumes with bibasilar opacities suggesting atelectasis or infiltrate. IMPRESSION: 1. Endotracheal tube with distal tip in the right mainstem bronchus, it need to be retracted 3-4 cm. 2. Low lung volumes with bibasilar opacities suggesting atelectasis or infiltrate. Electronically Signed  By: Keane Police D.O.   On: 06/15/2022 22:13   DG Abd Portable 1V  Result Date: 06/15/2022 CLINICAL DATA:  Check feeding catheter placement EXAM: PORTABLE ABDOMEN - 1 VIEW COMPARISON:  None Available. FINDINGS: Gastric catheter is noted within the stomach. Weighted feeding catheter is noted in the distal stomach near the pyloric channel. Multiple dilated loops of small bowel are noted suspicious for small bowel obstruction. No free air is noted. IMPRESSION: Gastric catheters as described. Electronically Signed   By: Inez Catalina M.D.   On: 06/15/2022 22:12   DG Abd 1 View  Result Date: 06/15/2022 CLINICAL DATA:  Ileus. EXAM: ABDOMEN - 1 VIEW COMPARISON:  June 14, 2022. FINDINGS: Distal tip of nasogastric tube is seen in expected position of  stomach. Distal tip of feeding tube is seen in expected position of distal stomach. Dilated small bowel loops are noted concerning for ileus or possibly distal small bowel obstruction. IMPRESSION: Stable dilated small bowel loops are noted concerning for ileus or possibly distal small bowel obstruction. Electronically Signed   By: Marijo Conception M.D.   On: 06/15/2022 10:22   US Abdomen Complete  Result Date: 06/14/2022 CLINICAL DATA:  51 year old female with cirrhosis. EXAM: ABDOMEN ULTRASOUND COMPLETE COMPARISON:  06/19/2022 CT and prior studies FINDINGS: Gallbladder: Gallbladder sludge is noted with mild gallbladder wall thickening. There is no evidence of cholelithiasis or sonographic Murphy sign. Common bile duct: Diameter: Not visualized. No intrahepatic biliary dilatation noted. Liver: Heterogeneous increased hepatic echogenicity noted. No definite focal hepatic lesion noted. Equivocal nodularity of the hepatic contour or identified. Portal vein is patent on color Doppler imaging with normal direction of blood flow towards the liver. IVC: No abnormality visualized. Pancreas: Visualized portion unremarkable. Spleen: UPPER limits normal in size. Right Kidney: Length: 11.3 cm. Echogenicity within normal limits. No mass or hydronephrosis visualized. Left Kidney: Length: 13.6 cm. Echogenicity within normal limits. No mass or hydronephrosis visualized. Abdominal aorta: No aneurysm visualized. Other findings: Trace perihepatic ascites noted. IMPRESSION: 1. Hepatic steatosis with sonographic findings suggestive of cirrhosis. No focal hepatic lesions. UPPER limits normal spleen size. 2. Trace perihepatic ascites. 3. Mild gallbladder wall thickening without cholelithiasis, which is likely related to hepatic dysfunction. If there is very strong clinical suspicion for acute cholecystitis, recommend nuclear medicine study. 4. CBD not well visualized. No evidence of intrahepatic biliary dilatation. Electronically  Signed   By: Margarette Canada M.D.   On: 06/14/2022 17:46   DG Abd 1 View  Result Date: 06/14/2022 CLINICAL DATA:  Confirm orogastric tube placement. EXAM: ABDOMEN - 1 VIEW COMPARISON:  Radiograph earlier today FINDINGS: Tip of the weighted enteric tube in the right upper quadrant in the region of the distal stomach or proximal duodenum. Persisting gaseous distention of bowel loops centrally. Patchy bibasilar opacities in the included lung bases. IMPRESSION: Tip of the weighted enteric tube in the right upper quadrant in the region of the distal stomach or proximal duodenum. Electronically Signed   By: Keith Rake M.D.   On: 06/14/2022 15:23   DG Abd 1 View  Result Date: 06/14/2022 CLINICAL DATA:  Hepatic cirrhosis with ascites. EXAM: ABDOMEN - 1 VIEW COMPARISON:  Abdominal radiograph dated June 06, 2022 FINDINGS: Gaseous distention of the bowel loops in a nonobstructive pattern. Weighted tip feeding tube with distal tip in the pyloric region. No appreciable intraperitoneal free air. Irregularity of the left superior pubic ramus. IMPRESSION: 1.  Weighted tip feeding tube with distal tip in the pyloric region. 2. Gaseous distention  of multiple bowel loops in a nonobstructive pattern suggesting ileus. 3. Irregularity of the left pubic ramus about the pubic symphysis, which may be secondary to motion artifact. Follow-up examination is suggested. Electronically Signed   By: Keane Police D.O.   On: 06/14/2022 12:20    ROS - unable to obtain 2/2 critical condition of patient  PE Blood pressure (!) 101/55, pulse 98, temperature (!) 97.5 F (36.4 C), resp. rate 20, weight 73.6 kg, SpO2 100 %. Constitutional: Intubated, sedated Eyes: Moist conjunctiva CV: RRR GI: Abd soft, moderately distended    Results for orders placed or performed during the hospital encounter of 05/24/2022 (from the past 48 hour(s))  CBC with Differential/Platelet     Status: Abnormal   Collection Time: 06/14/22  3:06 AM   Result Value Ref Range   WBC 10.8 (H) 4.0 - 10.5 K/uL   RBC 2.48 (L) 3.87 - 5.11 MIL/uL   Hemoglobin 9.1 (L) 12.0 - 15.0 g/dL   HCT 30.0 (L) 36.0 - 46.0 %   MCV 121.0 (H) 80.0 - 100.0 fL   MCH 36.7 (H) 26.0 - 34.0 pg   MCHC 30.3 30.0 - 36.0 g/dL   RDW 17.4 (H) 11.5 - 15.5 %   Platelets 216 150 - 400 K/uL   nRBC 0.0 0.0 - 0.2 %   Neutrophils Relative % 75 %   Neutro Abs 8.1 (H) 1.7 - 7.7 K/uL   Lymphocytes Relative 12 %   Lymphs Abs 1.3 0.7 - 4.0 K/uL   Monocytes Relative 11 %   Monocytes Absolute 1.1 (H) 0.1 - 1.0 K/uL   Eosinophils Relative 1 %   Eosinophils Absolute 0.1 0.0 - 0.5 K/uL   Basophils Relative 0 %   Basophils Absolute 0.0 0.0 - 0.1 K/uL   Immature Granulocytes 1 %   Abs Immature Granulocytes 0.11 (H) 0.00 - 0.07 K/uL    Comment: Performed at [facility], [address], [place], [place] [address]  Comprehensive metabolic panel     Status: Abnormal   Collection Time: 06/14/22  3:06 AM  Result Value Ref Range   Sodium 151 (H) 135 - 145 mmol/L   Potassium 3.3 (L) 3.5 - 5.1 mmol/L   Chloride 110 98 - 111 mmol/L   CO2 26 22 - 32 mmol/L   Glucose, Bld 167 (H) 70 - 99 mg/dL    Comment: Glucose reference range applies only to samples taken after fasting for at least 8 hours.   BUN 51 (H) 6 - 20 mg/dL   Creatinine, Ser 0.59 0.44 - 1.00 mg/dL   Calcium 9.4 8.9 - 10.3 mg/dL   Total Protein 6.8 6.5 - 8.1 g/dL   Albumin 2.7 (L) 3.5 - 5.0 g/dL   AST 49 (H) 15 - 41 U/L   ALT 17 0 - 44 U/L   Alkaline Phosphatase 105 38 - 126 U/L   Total Bilirubin 2.2 (H) 0.3 - 1.2 mg/dL   GFR, Estimated >60 >60 mL/min    Comment: (NOTE) Calculated using the CKD-EPI Creatinine Equation (2021)    Anion gap 15 5 - 15    Comment: Performed at [facility], [place] [address], [place], [place] [address]  Magnesium     Status: Abnormal   Collection Time: 06/14/22  3:06 AM  Result Value Ref Range   Magnesium 2.6 (H) 1.7 - 2.4 mg/dL    Comment: Performed at [facility] [address], [place], [place] [address]  Glucose, capillary  Status: Abnormal   Collection Time: 06/14/22  3:06 AM  Result Value Ref Range   Glucose-Capillary 148 (H) 70 - 99 mg/dL    Comment: Glucose reference range applies only to samples taken after fasting for at least 8 hours.  Glucose, capillary     Status: Abnormal   Collection Time: 06/14/22  7:34 AM  Result Value Ref Range   Glucose-Capillary 125 (H) 70 - 99 mg/dL    Comment: Glucose reference range applies only to samples taken after fasting for at least 8 hours.  Glucose, capillary     Status: Abnormal   Collection Time: 06/14/22  1:44 PM  Result Value Ref Range   Glucose-Capillary 141 (H) 70 - 99 mg/dL    Comment: Glucose reference range applies only to samples taken after fasting for at least 8 hours.  Lactic acid, plasma     Status: None   Collection Time: 06/14/22  2:13 PM  Result Value Ref Range   Lactic Acid, Venous 1.3 0.5 - 1.9 mmol/L    Comment: Performed at [facility] [address] [place], [place] [address]  Ammonia     Status: Abnormal   Collection Time: 06/14/22  2:13 PM  Result Value Ref Range   Ammonia 72 (H) 9 - 35 umol/L    Comment: Performed at [facility], [place] [address], [place], [place] [address]  Amylase     Status: None   Collection Time: 06/14/22  2:52 PM  Result Value Ref Range   Amylase 64 28 - 100 U/L    Comment: Performed at [facility] [address], [place], [place] [address]  Lipase, blood     Status: Abnormal   Collection Time: 06/14/22  2:52 PM  Result Value Ref Range   Lipase 73 (H) 11 - 51 U/L    Comment: Performed at [facility] [address] [place], [place] [address]  Glucose, capillary     Status: Abnormal   Collection Time: 06/14/22  3:39 PM  Result Value Ref Range   Glucose-Capillary 137 (H) 70 - 99 mg/dL    Comment: Glucose reference range applies only to samples taken after fasting for at least 8 hours.  Glucose, capillary      Status: Abnormal   Collection Time: 06/14/22  7:46 PM  Result Value Ref Range   Glucose-Capillary 146 (H) 70 - 99 mg/dL    Comment: Glucose reference range applies only to samples taken after fasting for at least 8 hours.  Glucose, capillary     Status: Abnormal   Collection Time: 06/14/22 11:20 PM  Result Value Ref Range   Glucose-Capillary 139 (H) 70 - 99 mg/dL    Comment: Glucose reference range applies only to samples taken after fasting for at least 8 hours.  Glucose, capillary     Status: Abnormal   Collection Time: 06/15/22  3:20 AM  Result Value Ref Range   Glucose-Capillary 157 (H) 70 - 99 mg/dL    Comment: Glucose reference range applies only to samples taken after fasting for at least 8 hours.  CBC with Differential/Platelet     Status: Abnormal   Collection Time: 06/15/22  4:03 AM  Result Value Ref Range   WBC 13.1 (H) 4.0 - 10.5 K/uL   RBC 2.74 (L) 3.87 - 5.11 MIL/uL   Hemoglobin 10.5 (L) 12.0 - 15.0 g/dL   HCT 32.6 (L) 36.0 - 46.0 %   MCV 119.0 (H) 80.0 -  100.0 fL   MCH 38.3 (H) 26.0 - 34.0 pg   MCHC 32.2 30.0 - 36.0 g/dL   RDW 17.4 (H) 11.5 - 15.5 %   Platelets 277 150 - 400 K/uL   nRBC 0.5 (H) 0.0 - 0.2 %   Neutrophils Relative % 75 %   Neutro Abs 9.6 (H) 1.7 - 7.7 K/uL   Lymphocytes Relative 14 %   Lymphs Abs 1.9 0.7 - 4.0 K/uL   Monocytes Relative 10 %   Monocytes Absolute 1.3 (H) 0.1 - 1.0 K/uL   Eosinophils Relative 0 %   Eosinophils Absolute 0.1 0.0 - 0.5 K/uL   Basophils Relative 0 %   Basophils Absolute 0.0 0.0 - 0.1 K/uL   Immature Granulocytes 1 %   Abs Immature Granulocytes 0.18 (H) 0.00 - 0.07 K/uL    Comment: Performed at [facility] [address] [place], [place] [address]  Comprehensive metabolic panel     Status: Abnormal   Collection Time: 06/15/22  4:03 AM  Result Value Ref Range   Sodium 153 (H) 135 - 145 mmol/L   Potassium 4.5 3.5 - 5.1 mmol/L   Chloride 109 98 - 111 mmol/L   CO2 27 22 - 32 mmol/L   Glucose, Bld 170 (H) 70 -  99 mg/dL    Comment: Glucose reference range applies only to samples taken after fasting for at least 8 hours.   BUN 57 (H) 6 - 20 mg/dL   Creatinine, Ser 0.89 0.44 - 1.00 mg/dL   Calcium 9.9 8.9 - 10.3 mg/dL   Total Protein 7.2 6.5 - 8.1 g/dL   Albumin 3.0 (L) 3.5 - 5.0 g/dL   AST 57 (H) 15 - 41 U/L   ALT 21 0 - 44 U/L   Alkaline Phosphatase 112 38 - 126 U/L   Total Bilirubin 2.7 (H) 0.3 - 1.2 mg/dL   GFR, Estimated >60 >60 mL/min    Comment: (NOTE) Calculated using the CKD-EPI Creatinine Equation (2021)    Anion gap 17 (H) 5 - 15    Comment: Performed at [facility], [place] [address], [place], [place] [address]  Magnesium     Status: Abnormal   Collection Time: 06/15/22  4:03 AM  Result Value Ref Range   Magnesium 2.6 (H) 1.7 - 2.4 mg/dL    Comment: Performed at [facility] [address], [place], [place] [address]  Phosphorus     Status: None   Collection Time: 06/15/22  4:03 AM  Result Value Ref Range   Phosphorus 3.6 2.5 - 4.6 mg/dL    Comment: Performed at [facility] [address], [place], [place] [address]  Glucose, capillary     Status: Abnormal   Collection Time: 06/15/22  7:56 AM  Result Value Ref Range   Glucose-Capillary 129 (H) 70 - 99 mg/dL    Comment: Glucose reference range applies only to samples taken after fasting for at least 8 hours.  I-STAT 7, (LYTES, BLD GAS, ICA, H+H)     Status: Abnormal   Collection Time: 06/15/22 10:37 AM  Result Value Ref Range   pH, Arterial 7.557 (H) 7.35 - 7.45   pCO2 arterial 35.2 32 - 48 mmHg   pO2, Arterial 67 (L) 83 - 108 mmHg   Bicarbonate 31.3 (H) 20.0 - 28.0 mmol/L   TCO2 32 22 - 32 mmol/L   O2 Saturation 95 %   Acid-Base Excess 9.0 (H) 0.0 - 2.0 mmol/L  Sodium 151 (H) 135 - 145 mmol/L   Potassium 5.0 3.5 - 5.1 mmol/L   Calcium, Ion 1.18 1.15 - 1.40 mmol/L   HCT 32.0 (L) 36.0 - 46.0 %   Hemoglobin 10.9 (L) 12.0 - 15.0 g/dL   Sample type ARTERIAL   Procalcitonin - Baseline     Status: None    Collection Time: 06/15/22 10:54 AM  Result Value Ref Range   Procalcitonin 0.55 ng/mL    Comment:        Interpretation: PCT > 0.5 ng/mL and <= 2 ng/mL: Systemic infection (sepsis) is possible, but other conditions are known to elevate PCT as well. (NOTE)       Sepsis PCT Algorithm           Lower Respiratory Tract                                      Infection PCT Algorithm    ----------------------------     ----------------------------         PCT < 0.25 ng/mL                PCT < 0.10 ng/mL          Strongly encourage             Strongly discourage   discontinuation of antibiotics    initiation of antibiotics    ----------------------------     -----------------------------       PCT 0.25 - 0.50 ng/mL            PCT 0.10 - 0.25 ng/mL               OR       >80% decrease in PCT            Discourage initiation of                                            antibiotics      Encourage discontinuation           of antibiotics    ----------------------------     -----------------------------         PCT >= 0.50 ng/mL              PCT 0.26 - 0.50 ng/mL                AND       <80% decrease in PCT             Encourage initiation of                                             antibiotics       Encourage continuation           of antibiotics    ----------------------------     -----------------------------        PCT >= 0.50 ng/mL                  PCT > 0.50 ng/mL               AND         increase in PCT  Strongly encourage                                      initiation of antibiotics    Strongly encourage escalation           of antibiotics                                     -----------------------------                                           PCT <= 0.25 ng/mL                                                 OR                                        > 80% decrease in PCT                                      Discontinue / Do not initiate                                              antibiotics  Performed at [facility], [place] [address] [place], [place] [address]   Lactic acid, plasma     Status: None   Collection Time: 06/15/22 10:54 AM  Result Value Ref Range   Lactic Acid, Venous 1.7 0.5 - 1.9 mmol/L    Comment: Performed at [facility] [address], [place], [place] [address]  Glucose, capillary     Status: Abnormal   Collection Time: 06/15/22 11:35 AM  Result Value Ref Range   Glucose-Capillary 146 (H) 70 - 99 mg/dL    Comment: Glucose reference range applies only to samples taken after fasting for at least 8 hours.  Glucose, capillary     Status: Abnormal   Collection Time: 06/15/22  4:12 PM  Result Value Ref Range   Glucose-Capillary 207 (H) 70 - 99 mg/dL    Comment: Glucose reference range applies only to samples taken after fasting for at least 8 hours.  Glucose, capillary     Status: Abnormal   Collection Time: 06/15/22  7:53 PM  Result Value Ref Range   Glucose-Capillary 135 (H) 70 - 99 mg/dL    Comment: Glucose reference range applies only to samples taken after fasting for at least 8 hours.  Basic metabolic panel     Status: Abnormal   Collection Time: 06/15/22  8:52 PM  Result Value Ref Range   Sodium 150 (H) 135 - 145 mmol/L   Potassium 4.6 3.5 - 5.1 mmol/L   Chloride 104 98 - 111 mmol/L   CO2 31 22 - 32 mmol/L   Glucose, Bld 129 (H) 70 -  99 mg/dL    Comment: Glucose reference range applies only to samples taken after fasting for at least 8 hours.   BUN 70 (H) 6 - 20 mg/dL   Creatinine, Ser 1.88 (H) 0.44 - 1.00 mg/dL   Calcium 9.3 8.9 - 10.3 mg/dL   GFR, Estimated 32 (L) >60 mL/min    Comment: (NOTE) Calculated using the CKD-EPI Creatinine Equation (2021)    Anion gap 15 5 - 15    Comment: Performed at [facility] [address] [place], [place] [address]  Magnesium     Status: Abnormal   Collection Time: 06/15/22  8:52 PM  Result Value Ref Range   Magnesium 3.0 (H) 1.7 - 2.4  mg/dL    Comment: Performed at [facility] [address], [place], [place] [address]  Ammonia     Status: Abnormal   Collection Time: 06/15/22  8:52 PM  Result Value Ref Range   Ammonia 108 (H) 9 - 35 umol/L    Comment: Performed at [facility], [place] [address] [place], [place] [address]  I-STAT 7, (LYTES, BLD GAS, ICA, H+H)     Status: Abnormal   Collection Time: 06/15/22  9:07 PM  Result Value Ref Range   pH, Arterial 7.564 (H) 7.35 - 7.45   pCO2 arterial 38.2 32 - 48 mmHg   pO2, Arterial 91 83 - 108 mmHg   Bicarbonate 34.7 (H) 20.0 - 28.0 mmol/L   TCO2 36 (H) 22 - 32 mmol/L   O2 Saturation 98 %   Acid-Base Excess 11.0 (H) 0.0 - 2.0 mmol/L   Sodium 149 (H) 135 - 145 mmol/L   Potassium 4.6 3.5 - 5.1 mmol/L   Calcium, Ion 1.13 (L) 1.15 - 1.40 mmol/L   HCT 32.0 (L) 36.0 - 46.0 %   Hemoglobin 10.9 (L) 12.0 - 15.0 g/dL   Patient temperature 97.6 F    Sample type ARTERIAL   I-STAT 7, (LYTES, BLD GAS, ICA, H+H)     Status: Abnormal   Collection Time: 06/15/22 10:38 PM  Result Value Ref Range   pH, Arterial 7.502 (H) 7.35 - 7.45   pCO2 arterial 43.8 32 - 48 mmHg   pO2, Arterial 69 (L) 83 - 108 mmHg   Bicarbonate 34.5 (H) 20.0 - 28.0 mmol/L   TCO2 36 (H) 22 - 32 mmol/L   O2 Saturation 95 %   Acid-Base Excess 10.0 (H) 0.0 - 2.0 mmol/L   Sodium 148 (H) 135 - 145 mmol/L   Potassium 4.9 3.5 - 5.1 mmol/L   Calcium, Ion 1.12 (L) 1.15 - 1.40 mmol/L   HCT 32.0 (L) 36.0 - 46.0 %   Hemoglobin 10.9 (L) 12.0 - 15.0 g/dL   Patient temperature 97.6 F    Collection site RADIAL, ALLEN'S TEST ACCEPTABLE    Drawn by Operator    Sample type ARTERIAL   Glucose, capillary     Status: Abnormal   Collection Time: 06/15/22 11:49 PM  Result Value Ref Range   Glucose-Capillary 220 (H) 70 - 99 mg/dL    Comment: Glucose reference range applies only to samples taken after fasting for at least 8 hours.    CT ABDOMEN PELVIS WO CONTRAST  Result Date: 06/15/2022 CLINICAL DATA:  Dilated  bowel loops on recent plain film EXAM: CT ABDOMEN AND PELVIS WITHOUT CONTRAST TECHNIQUE: Multidetector CT imaging of the abdomen and pelvis was performed following the standard protocol without IV contrast. RADIATION DOSE REDUCTION: This exam was performed  according to the departmental dose-optimization program which includes automated exposure control, adjustment of the mA and/or kV according to patient size and/or use of iterative reconstruction technique. COMPARISON:  06/16/2022 FINDINGS: Lower chest: Bilateral lower lobe consolidation is noted with moderate left-sided pleural effusion. This is stable from the prior exam. Hepatobiliary: Small hypodensity is noted within the liver near the dome of the liver stable from the prior exam likely representing a small cyst. Mild nodularity of the liver is noted consistent with underlying cirrhosis. Gallbladder is within normal limits. Dependent density is noted within the gallbladder likely representing sludge. No definitive gallstones are seen. Mild perihepatic fluid is seen. Pancreas: Unremarkable. No pancreatic ductal dilatation or surrounding inflammatory changes. Spleen: Normal in size without focal abnormality. Adrenals/Urinary Tract: Adrenal glands are within normal limits. Kidneys are unremarkable. No renal calculi or obstructive changes are noted. The bladder is partially distended. Stomach/Bowel: Rectal tube is noted in place. The colon shows mild diverticular change without obstructive process. Air is noted throughout the transverse colon. The appendix is not visualized consistent with a prior surgical history. Stomach is distended with fluid despite gastric catheters. The weighted feeding catheter extends towards the pylorus. Multiple dilated loops of small bowel are noted extending from the duodenum to the mid to distal ileum. Fecalization of small bowel contents is noted. A transition zone is seen in the anterior abdomen best noted on image number 57 of  series 3. Changes may be related to adhesions. The distal most small bowel is within normal limits. Vascular/Lymphatic: No significant vascular findings are present. No enlarged abdominal or pelvic lymph nodes. Reproductive: Uterus and bilateral adnexa are unremarkable. Other: Mild free fluid is noted within the abdomen likely related to the underlying cirrhosis although some may be reactive to the small bowel abnormality. No free air is seen. Musculoskeletal: Degenerative changes of lumbar spine are noted. IMPRESSION: Multiple dilated loops of small bowel with a transition zone in the mid to distal ileum consistent with a least partial small bowel obstruction. Bibasilar consolidation with left-sided pleural effusions stable from the prior CT. Cirrhotic changes of the liver with ascites. Ascites may also be in part due to the underlying obstructive change. Electronically Signed   By: Inez Catalina M.D.   On: 06/15/2022 23:30   DG CHEST PORT 1 VIEW  Result Date: 06/15/2022 CLINICAL DATA:  Endotracheal tube and feeding tube placement. EXAM: PORTABLE CHEST 1 VIEW COMPARISON:  None Available. FINDINGS: Endotracheal tube with distal tip in the right mainstem bronchus, it need to be retracted 4-5 cm. Feeding tube coursing below the diaphragm with distal tip not included. Heart is normal in size. Low lung volumes with bibasilar opacities suggesting atelectasis or infiltrate. IMPRESSION: 1. Endotracheal tube with distal tip in the right mainstem bronchus, it need to be retracted 3-4 cm. 2. Low lung volumes with bibasilar opacities suggesting atelectasis or infiltrate. Electronically Signed   By: Keane Police D.O.   On: 06/15/2022 22:13   DG Abd Portable 1V  Result Date: 06/15/2022 CLINICAL DATA:  Check feeding catheter placement EXAM: PORTABLE ABDOMEN - 1 VIEW COMPARISON:  None Available. FINDINGS: Gastric catheter is noted within the stomach. Weighted feeding catheter is noted in the distal stomach near the  pyloric channel. Multiple dilated loops of small bowel are noted suspicious for small bowel obstruction. No free air is noted. IMPRESSION: Gastric catheters as described. Electronically Signed   By: Inez Catalina M.D.   On: 06/15/2022 22:12   DG Abd 1 View  Result  Date: 06/15/2022 CLINICAL DATA:  Ileus. EXAM: ABDOMEN - 1 VIEW COMPARISON:  June 14, 2022. FINDINGS: Distal tip of nasogastric tube is seen in expected position of stomach. Distal tip of feeding tube is seen in expected position of distal stomach. Dilated small bowel loops are noted concerning for ileus or possibly distal small bowel obstruction. IMPRESSION: Stable dilated small bowel loops are noted concerning for ileus or possibly distal small bowel obstruction. Electronically Signed   By: Marijo Conception M.D.   On: 06/15/2022 10:22   US Abdomen Complete  Result Date: 06/14/2022 CLINICAL DATA:  51 year old female with cirrhosis. EXAM: ABDOMEN ULTRASOUND COMPLETE COMPARISON:  06/09/2022 CT and prior studies FINDINGS: Gallbladder: Gallbladder sludge is noted with mild gallbladder wall thickening. There is no evidence of cholelithiasis or sonographic Murphy sign. Common bile duct: Diameter: Not visualized. No intrahepatic biliary dilatation noted. Liver: Heterogeneous increased hepatic echogenicity noted. No definite focal hepatic lesion noted. Equivocal nodularity of the hepatic contour or identified. Portal vein is patent on color Doppler imaging with normal direction of blood flow towards the liver. IVC: No abnormality visualized. Pancreas: Visualized portion unremarkable. Spleen: UPPER limits normal in size. Right Kidney: Length: 11.3 cm. Echogenicity within normal limits. No mass or hydronephrosis visualized. Left Kidney: Length: 13.6 cm. Echogenicity within normal limits. No mass or hydronephrosis visualized. Abdominal aorta: No aneurysm visualized. Other findings: Trace perihepatic ascites noted. IMPRESSION: 1. Hepatic steatosis with  sonographic findings suggestive of cirrhosis. No focal hepatic lesions. UPPER limits normal spleen size. 2. Trace perihepatic ascites. 3. Mild gallbladder wall thickening without cholelithiasis, which is likely related to hepatic dysfunction. If there is very strong clinical suspicion for acute cholecystitis, recommend nuclear medicine study. 4. CBD not well visualized. No evidence of intrahepatic biliary dilatation. Electronically Signed   By: Margarette Canada M.D.   On: 06/14/2022 17:46   DG Abd 1 View  Result Date: 06/14/2022 CLINICAL DATA:  Confirm orogastric tube placement. EXAM: ABDOMEN - 1 VIEW COMPARISON:  Radiograph earlier today FINDINGS: Tip of the weighted enteric tube in the right upper quadrant in the region of the distal stomach or proximal duodenum. Persisting gaseous distention of bowel loops centrally. Patchy bibasilar opacities in the included lung bases. IMPRESSION: Tip of the weighted enteric tube in the right upper quadrant in the region of the distal stomach or proximal duodenum. Electronically Signed   By: Keith Rake M.D.   On: 06/14/2022 15:23   DG Abd 1 View  Result Date: 06/14/2022 CLINICAL DATA:  Hepatic cirrhosis with ascites. EXAM: ABDOMEN - 1 VIEW COMPARISON:  Abdominal radiograph dated June 06, 2022 FINDINGS: Gaseous distention of the bowel loops in a nonobstructive pattern. Weighted tip feeding tube with distal tip in the pyloric region. No appreciable intraperitoneal free air. Irregularity of the left superior pubic ramus. IMPRESSION: 1.  Weighted tip feeding tube with distal tip in the pyloric region. 2. Gaseous distention of multiple bowel loops in a nonobstructive pattern suggesting ileus. 3. Irregularity of the left pubic ramus about the pubic symphysis, which may be secondary to motion artifact. Follow-up examination is suggested. Electronically Signed   By: Keane Police D.O.   On: 06/14/2022 12:20     A/P: Deanna Mcmillan is an 51 y.o. female with  pSBO  -NPO, NG tube to low intermittent wall suction -Unclear at present if actually ileus vs a partial SBO but regardless, would not advocate for any urgent surgical interventions at present -SBO protocol with PO contrast to monitor transit -Ok for rectal  lactulose  I spent a total of 65 minutes in both face-to-face and non-face-to-face activities, excluding procedures performed, for this visit on the date of this encounter.  Nadeen Landau, [facility], [place]

## 2022-06-16 NOTE — Progress Notes (Signed)
Acute respiratory failure with hypoxia (HCC)  Subjective: NG draining well, 3.8L  Objective: Vital signs in last 24 hours: Temp:  [97 F (36.1 C)-99.2 F (37.3 C)] 97.5 F (36.4 C) (11/25 1000) Pulse Rate:  [98-115] 99 (11/25 1000) Resp:  [12-48] 20 (11/25 1000) BP: (66-128)/(48-89) 107/56 (11/25 1000) SpO2:  [93 %-100 %] 99 % (11/25 1000) FiO2 (%):  [40 %-100 %] 50 % (11/25 0833) Weight:  [64.5 kg] 64.5 kg (11/25 0545) Last BM Date : 06/16/22  Intake/Output from previous day: 11/24 0701 - 11/25 0700 In: 3377.4 [I.V.:1849.7; NG/GT:120; IV Piggyback:1407.7] Out: 4450 [Emesis/NG output:3800; Stool:650] Intake/Output this shift: Total I/O In: 595.4 [I.V.:308.4; IV Piggyback:287] Out: 350 [Urine:50; Stool:300]  General appearance: intubated, sedated GI: distended  Lab Results:  Results for orders placed or performed during the hospital encounter of 06/08/2022 (from the past 24 hour(s))  Culture, blood (Routine X 2) w Reflex to ID Panel     Status: None (Preliminary result)   Collection Time: 06/15/22  1:44 PM   Specimen: BLOOD  Result Value Ref Range   Specimen Description BLOOD BLOOD LEFT HAND    Special Requests      BOTTLES DRAWN AEROBIC AND ANAEROBIC Blood Culture adequate volume   Culture      NO GROWTH < 24 HOURS Performed at [facility], [address], [place], [place] [address]    Report Status PENDING   Culture, blood (Routine X 2) w Reflex to ID Panel     Status: None (Preliminary result)   Collection Time: 06/15/22  1:51 PM   Specimen: BLOOD  Result Value Ref Range   Specimen Description BLOOD BLOOD LEFT ARM IN PEDIATRIC BOTTLE    Special Requests IN PEDIATRIC BOTTLE Blood Culture adequate volume    Culture      NO GROWTH < 24 HOURS Performed at [facility], [place] [address] [place], [place] [address]    Report Status PENDING   Glucose, capillary     Status: Abnormal   Collection Time: 06/15/22  4:12 PM  Result Value Ref Range    Glucose-Capillary 207 (H) 70 - 99 mg/dL  Glucose, capillary     Status: Abnormal   Collection Time: 06/15/22  7:53 PM  Result Value Ref Range   Glucose-Capillary 135 (H) 70 - 99 mg/dL  Basic metabolic panel     Status: Abnormal   Collection Time: 06/15/22  8:52 PM  Result Value Ref Range   Sodium 150 (H) 135 - 145 mmol/L   Potassium 4.6 3.5 - 5.1 mmol/L   Chloride 104 98 - 111 mmol/L   CO2 31 22 - 32 mmol/L   Glucose, Bld 129 (H) 70 - 99 mg/dL   BUN 70 (H) 6 - 20 mg/dL   Creatinine, Ser 1.88 (H) 0.44 - 1.00 mg/dL   Calcium 9.3 8.9 - 10.3 mg/dL   GFR, Estimated 32 (L) >60 mL/min   Anion gap 15 5 - 15  Magnesium     Status: Abnormal   Collection Time: 06/15/22  8:52 PM  Result Value Ref Range   Magnesium 3.0 (H) 1.7 - 2.4 mg/dL  Ammonia     Status: Abnormal   Collection Time: 06/15/22  8:52 PM  Result Value Ref Range   Ammonia 108 (H) 9 - 35 umol/L  I-STAT 7, (LYTES, BLD GAS, ICA, H+H)     Status: Abnormal   Collection Time: 06/15/22  9:07 PM  Result Value Ref Range   pH, Arterial  7.564 (H) 7.35 - 7.45   pCO2 arterial 38.2 32 - 48 mmHg   pO2, Arterial 91 83 - 108 mmHg   Bicarbonate 34.7 (H) 20.0 - 28.0 mmol/L   TCO2 36 (H) 22 - 32 mmol/L   O2 Saturation 98 %   Acid-Base Excess 11.0 (H) 0.0 - 2.0 mmol/L   Sodium 149 (H) 135 - 145 mmol/L   Potassium 4.6 3.5 - 5.1 mmol/L   Calcium, Ion 1.13 (L) 1.15 - 1.40 mmol/L   HCT 32.0 (L) 36.0 - 46.0 %   Hemoglobin 10.9 (L) 12.0 - 15.0 g/dL   Patient temperature 97.6 F    Sample type ARTERIAL   I-STAT 7, (LYTES, BLD GAS, ICA, H+H)     Status: Abnormal   Collection Time: 06/15/22 10:38 PM  Result Value Ref Range   pH, Arterial 7.502 (H) 7.35 - 7.45   pCO2 arterial 43.8 32 - 48 mmHg   pO2, Arterial 69 (L) 83 - 108 mmHg   Bicarbonate 34.5 (H) 20.0 - 28.0 mmol/L   TCO2 36 (H) 22 - 32 mmol/L   O2 Saturation 95 %   Acid-Base Excess 10.0 (H) 0.0 - 2.0 mmol/L   Sodium 148 (H) 135 - 145 mmol/L   Potassium 4.9 3.5 - 5.1 mmol/L   Calcium,  Ion 1.12 (L) 1.15 - 1.40 mmol/L   HCT 32.0 (L) 36.0 - 46.0 %   Hemoglobin 10.9 (L) 12.0 - 15.0 g/dL   Patient temperature 97.6 F    Collection site RADIAL, ALLEN'S TEST ACCEPTABLE    Drawn by Operator    Sample type ARTERIAL   Glucose, capillary     Status: Abnormal   Collection Time: 06/15/22 11:49 PM  Result Value Ref Range   Glucose-Capillary 220 (H) 70 - 99 mg/dL  CBC     Status: Abnormal   Collection Time: 06/16/22  3:35 AM  Result Value Ref Range   WBC 20.6 (H) 4.0 - 10.5 K/uL   RBC 2.75 (L) 3.87 - 5.11 MIL/uL   Hemoglobin 10.2 (L) 12.0 - 15.0 g/dL   HCT 32.7 (L) 36.0 - 46.0 %   MCV 118.9 (H) 80.0 - 100.0 fL   MCH 37.1 (H) 26.0 - 34.0 pg   MCHC 31.2 30.0 - 36.0 g/dL   RDW 16.9 (H) 11.5 - 15.5 %   Platelets 361 150 - 400 K/uL   nRBC 0.7 (H) 0.0 - 0.2 %  Comprehensive metabolic panel     Status: Abnormal   Collection Time: 06/16/22  3:35 AM  Result Value Ref Range   Sodium 148 (H) 135 - 145 mmol/L   Potassium 4.7 3.5 - 5.1 mmol/L   Chloride 102 98 - 111 mmol/L   CO2 31 22 - 32 mmol/L   Glucose, Bld 144 (H) 70 - 99 mg/dL   BUN 76 (H) 6 - 20 mg/dL   Creatinine, Ser 2.30 (H) 0.44 - 1.00 mg/dL   Calcium 9.1 8.9 - 10.3 mg/dL   Total Protein 7.1 6.5 - 8.1 g/dL   Albumin 2.9 (L) 3.5 - 5.0 g/dL   AST 62 (H) 15 - 41 U/L   ALT 21 0 - 44 U/L   Alkaline Phosphatase 108 38 - 126 U/L   Total Bilirubin 2.8 (H) 0.3 - 1.2 mg/dL   GFR, Estimated 25 (L) >60 mL/min   Anion gap 15 5 - 15  Lactic acid, plasma     Status: Abnormal   Collection Time: 06/16/22  3:35 AM  Result Value  Ref Range   Lactic Acid, Venous 2.1 (HH) 0.5 - 1.9 mmol/L  Magnesium     Status: Abnormal   Collection Time: 06/16/22  3:35 AM  Result Value Ref Range   Magnesium 2.7 (H) 1.7 - 2.4 mg/dL  Phosphorus     Status: Abnormal   Collection Time: 06/16/22  3:35 AM  Result Value Ref Range   Phosphorus 6.1 (H) 2.5 - 4.6 mg/dL  Procalcitonin     Status: None   Collection Time: 06/16/22  3:35 AM  Result Value Ref  Range   Procalcitonin 1.63 ng/mL  Protime-INR     Status: Abnormal   Collection Time: 06/16/22  3:35 AM  Result Value Ref Range   Prothrombin Time 17.4 (H) 11.4 - 15.2 seconds   INR 1.4 (H) 0.8 - 1.2  Glucose, capillary     Status: Abnormal   Collection Time: 06/16/22  7:43 AM  Result Value Ref Range   Glucose-Capillary 133 (H) 70 - 99 mg/dL  Lactic acid, plasma     Status: None   Collection Time: 06/16/22  8:26 AM  Result Value Ref Range   Lactic Acid, Venous 1.9 0.5 - 1.9 mmol/L  Basic metabolic panel     Status: Abnormal   Collection Time: 06/16/22  8:26 AM  Result Value Ref Range   Sodium 143 135 - 145 mmol/L   Potassium 3.8 3.5 - 5.1 mmol/L   Chloride 98 98 - 111 mmol/L   CO2 25 22 - 32 mmol/L   Glucose, Bld 208 (H) 70 - 99 mg/dL   BUN 68 (H) 6 - 20 mg/dL   Creatinine, Ser 2.24 (H) 0.44 - 1.00 mg/dL   Calcium 7.8 (L) 8.9 - 10.3 mg/dL   GFR, Estimated 26 (L) >60 mL/min   Anion gap 20 (H) 5 - 15     Studies/Results Radiology     MEDS, Scheduled  Chlorhexidine Gluconate Cloth  6 each Topical Daily   folic acid  1 mg Intravenous Daily   heparin  5,000 Units Subcutaneous Q8H   insulin aspart  0-15 Units Subcutaneous Q4H   lactulose  300 mL Rectal BID   leptospermum manuka honey  1 Application Topical Daily   mouth rinse  15 mL Mouth Rinse Q2H   pantoprazole (PROTONIX) IV  40 mg Intravenous Q24H   sodium chloride flush  10-40 mL Intracatheter Q12H   thiamine (VITAMIN B1) injection  100 mg Intravenous Daily     Assessment: Acute respiratory failure with hypoxia (HCC) Ileus vs pSBO  Plan: Pull back NG 10 cm and start SBO protocol   LOS: 18 days    Rosario Adie, MD [facility]  Patient's medical decision making was straightforward (25 mins met or exceeded with patient care and documentation).   06/16/2022 11:41 AM

## 2022-06-16 NOTE — Progress Notes (Signed)
Bladder scan performed around 2330 showed about 163mls of urine. Purewick in place.   Pt bladder scanned again around 0300, and volume noted to be 53mls. Pt had a small amount of amber urine (about 25cc) noted in Purewick canister.  [facility] called and made aware of poor urine output.

## 2022-06-16 NOTE — Progress Notes (Signed)
eLink Physician-Brief Progress Note Patient Name: Deanna Mcmillan DOB: 09-17-1970 MRN: [id number]   Date of Service  06/16/2022  HPI/Events of Note  Patient with a serum sodium of 150,, creatinine of 1.88 (GFR 32), and oliguria, renal function recently normal. She has had significant enteral losses from LIS.  eICU Interventions  Albumin 5 % 500 ml iv bolus followed by 12. 5 gm of 25 % iv Q 6 hours x 4.        [name] Markella Dao 06/16/2022, 3:36 AM

## 2022-06-17 ENCOUNTER — Inpatient Hospital Stay (HOSPITAL_COMMUNITY): Payer: 59

## 2022-06-17 DIAGNOSIS — A419 Sepsis, unspecified organism: Secondary | ICD-10-CM | POA: Diagnosis not present

## 2022-06-17 DIAGNOSIS — R569 Unspecified convulsions: Secondary | ICD-10-CM

## 2022-06-17 DIAGNOSIS — R6521 Severe sepsis with septic shock: Secondary | ICD-10-CM | POA: Diagnosis not present

## 2022-06-17 DIAGNOSIS — K56609 Unspecified intestinal obstruction, unspecified as to partial versus complete obstruction: Secondary | ICD-10-CM | POA: Diagnosis not present

## 2022-06-17 DIAGNOSIS — A329 Listeriosis, unspecified: Secondary | ICD-10-CM | POA: Diagnosis not present

## 2022-06-17 DIAGNOSIS — J9601 Acute respiratory failure with hypoxia: Secondary | ICD-10-CM | POA: Diagnosis not present

## 2022-06-17 LAB — CBC
HCT: 31.1 % — ABNORMAL LOW (ref 36.0–46.0)
Hemoglobin: 9.8 g/dL — ABNORMAL LOW (ref 12.0–15.0)
MCH: 37.4 pg — ABNORMAL HIGH (ref 26.0–34.0)
MCHC: 31.5 g/dL (ref 30.0–36.0)
MCV: 118.7 fL — ABNORMAL HIGH (ref 80.0–100.0)
Platelets: 324 10*3/uL (ref 150–400)
RBC: 2.62 MIL/uL — ABNORMAL LOW (ref 3.87–5.11)
RDW: 16.6 % — ABNORMAL HIGH (ref 11.5–15.5)
WBC: 23.5 10*3/uL — ABNORMAL HIGH (ref 4.0–10.5)
nRBC: 0.8 % — ABNORMAL HIGH (ref 0.0–0.2)

## 2022-06-17 LAB — COMPREHENSIVE METABOLIC PANEL
ALT: 21 U/L (ref 0–44)
AST: 60 U/L — ABNORMAL HIGH (ref 15–41)
Albumin: 3.8 g/dL (ref 3.5–5.0)
Alkaline Phosphatase: 107 U/L (ref 38–126)
Anion gap: 21 — ABNORMAL HIGH (ref 5–15)
BUN: 74 mg/dL — ABNORMAL HIGH (ref 6–20)
CO2: 26 mmol/L (ref 22–32)
Calcium: 8.3 mg/dL — ABNORMAL LOW (ref 8.9–10.3)
Chloride: 94 mmol/L — ABNORMAL LOW (ref 98–111)
Creatinine, Ser: 2.61 mg/dL — ABNORMAL HIGH (ref 0.44–1.00)
GFR, Estimated: 21 mL/min — ABNORMAL LOW (ref >60)
Glucose, Bld: 129 mg/dL — ABNORMAL HIGH (ref 70–99)
Potassium: 5.3 mmol/L — ABNORMAL HIGH (ref 3.5–5.1)
Sodium: 141 mmol/L (ref 135–145)
Total Bilirubin: 3.3 mg/dL — ABNORMAL HIGH (ref 0.3–1.2)
Total Protein: 7.4 g/dL (ref 6.5–8.1)

## 2022-06-17 LAB — URINALYSIS, ROUTINE W REFLEX MICROSCOPIC
Bacteria, UA: NONE SEEN
Bilirubin Urine: NEGATIVE
Glucose, UA: NEGATIVE mg/dL
Ketones, ur: NEGATIVE mg/dL
Leukocytes,Ua: NEGATIVE
Nitrite: NEGATIVE
Protein, ur: 30 mg/dL — AB
Specific Gravity, Urine: 1.019 (ref 1.005–1.030)
pH: 5 (ref 5.0–8.0)

## 2022-06-17 LAB — GLUCOSE, CAPILLARY
Glucose-Capillary: 121 mg/dL — ABNORMAL HIGH (ref 70–99)
Glucose-Capillary: 170 mg/dL — ABNORMAL HIGH (ref 70–99)
Glucose-Capillary: 198 mg/dL — ABNORMAL HIGH (ref 70–99)
Glucose-Capillary: 135 mg/dL — ABNORMAL HIGH (ref 70–99)
Glucose-Capillary: 126 mg/dL — ABNORMAL HIGH (ref 70–99)
Glucose-Capillary: 200 mg/dL — ABNORMAL HIGH (ref 70–99)

## 2022-06-17 LAB — AMMONIA: Ammonia: 116 umol/L — ABNORMAL HIGH (ref 9–35)

## 2022-06-17 LAB — PROCALCITONIN: Procalcitonin: 0.78 ng/mL

## 2022-06-17 LAB — LACTIC ACID, PLASMA
Lactic Acid, Venous: 6.8 mmol/L (ref 0.5–1.9)
Lactic Acid, Venous: 1.8 mmol/L (ref 0.5–1.9)

## 2022-06-17 LAB — MAGNESIUM: Magnesium: 2.8 mg/dL — ABNORMAL HIGH (ref 1.7–2.4)

## 2022-06-17 LAB — PHOSPHORUS: Phosphorus: 8.1 mg/dL — ABNORMAL HIGH (ref 2.5–4.6)

## 2022-06-17 MED ORDER — MIDAZOLAM HCL 2 MG/2ML IJ SOLN
5.0000 mg | Freq: Once | INTRAMUSCULAR | Status: AC
  Administered 2022-06-17: 5 mg via INTRAVENOUS

## 2022-06-17 MED ORDER — MIDAZOLAM BOLUS VIA INFUSION
4.0000 mg | Freq: Once | INTRAVENOUS | Status: AC
  Administered 2022-06-17: 4 mg via INTRAVENOUS
  Filled 2022-06-17: qty 4

## 2022-06-17 MED ORDER — LEVOCARNITINE 200 MG/ML IV SOLN
50.0000 mg/kg/d | Freq: Four times a day (QID) | INTRAVENOUS | Status: AC
  Administered 2022-06-17 (×3): 800 mg via INTRAVENOUS
  Filled 2022-06-17 (×3): qty 4

## 2022-06-17 MED ORDER — RIFAXIMIN 550 MG PO TABS
550.0000 mg | ORAL_TABLET | Freq: Two times a day (BID) | ORAL | Status: DC
  Administered 2022-06-17 – 2022-07-04 (×34): 550 mg
  Filled 2022-06-17 (×35): qty 1

## 2022-06-17 MED ORDER — SODIUM CHLORIDE 0.9 % IV SOLN
3500.0000 mg | Freq: Once | INTRAVENOUS | Status: DC
  Filled 2022-06-17: qty 35

## 2022-06-17 MED ORDER — NOREPINEPHRINE 16 MG/250ML-% IV SOLN
0.0000 ug/min | INTRAVENOUS | Status: DC
  Administered 2022-06-17: 20 ug/min via INTRAVENOUS
  Administered 2022-06-17 (×2): 25 ug/min via INTRAVENOUS
  Administered 2022-06-18: 28 ug/min via INTRAVENOUS
  Administered 2022-06-18: 24 ug/min via INTRAVENOUS
  Administered 2022-06-19: 25 ug/min via INTRAVENOUS
  Administered 2022-06-19: 28 ug/min via INTRAVENOUS
  Administered 2022-06-20: 26 ug/min via INTRAVENOUS
  Administered 2022-06-20: 24 ug/min via INTRAVENOUS
  Administered 2022-06-21: 11 ug/min via INTRAVENOUS
  Administered 2022-06-22: 4 ug/min via INTRAVENOUS
  Filled 2022-06-17 (×12): qty 250

## 2022-06-17 MED ORDER — LEVETIRACETAM IN NACL 1000 MG/100ML IV SOLN
1000.0000 mg | Freq: Two times a day (BID) | INTRAVENOUS | Status: DC
  Administered 2022-06-17 – 2022-06-18 (×3): 1000 mg via INTRAVENOUS
  Filled 2022-06-17 (×4): qty 100

## 2022-06-17 MED ORDER — MIDAZOLAM HCL 2 MG/2ML IJ SOLN: 2.0000 mg | Freq: Once | INTRAMUSCULAR | Status: DC

## 2022-06-17 MED ORDER — MIDAZOLAM-SODIUM CHLORIDE 100-0.9 MG/100ML-% IV SOLN
INTRAVENOUS | Status: AC
  Administered 2022-06-17: 5 mg/h via INTRAVENOUS
  Filled 2022-06-17: qty 100

## 2022-06-17 MED ORDER — MIDODRINE HCL 5 MG PO TABS
10.0000 mg | ORAL_TABLET | Freq: Three times a day (TID) | ORAL | Status: DC
  Administered 2022-06-17 – 2022-06-18 (×3): 10 mg
  Filled 2022-06-17 (×3): qty 2

## 2022-06-17 MED ORDER — LACTULOSE ENEMA
300.0000 mL | Freq: Three times a day (TID) | ORAL | Status: DC
  Administered 2022-06-17 (×4): 300 mL via RECTAL
  Filled 2022-06-17 (×5): qty 300

## 2022-06-17 MED ORDER — SODIUM CHLORIDE 0.9 % IV SOLN
3500.0000 mg | Freq: Once | INTRAVENOUS | Status: AC
  Administered 2022-06-17: 3500 mg via INTRAVENOUS
  Filled 2022-06-17: qty 35

## 2022-06-17 MED ORDER — SODIUM ZIRCONIUM CYCLOSILICATE 10 G PO PACK
10.0000 g | PACK | Freq: Three times a day (TID) | ORAL | Status: DC
  Administered 2022-06-17 (×3): 10 g
  Filled 2022-06-17 (×3): qty 1

## 2022-06-17 MED ORDER — PROPOFOL 1000 MG/100ML IV EMUL
0.0000 ug/kg/min | INTRAVENOUS | Status: DC
  Administered 2022-06-17: 25 ug/kg/min via INTRAVENOUS
  Administered 2022-06-17: 50 ug/kg/min via INTRAVENOUS
  Administered 2022-06-17: 40 ug/kg/min via INTRAVENOUS
  Administered 2022-06-18: 25 ug/kg/min via INTRAVENOUS
  Filled 2022-06-17 (×4): qty 100

## 2022-06-17 MED ORDER — SODIUM CHLORIDE 0.9 % IV SOLN
2.0000 g | Freq: Three times a day (TID) | INTRAVENOUS | Status: DC
  Administered 2022-06-17 – 2022-06-18 (×4): 2 g via INTRAVENOUS
  Filled 2022-06-17 (×5): qty 2000

## 2022-06-17 MED ORDER — LACTULOSE 10 GM/15ML PO SOLN
30.0000 g | Freq: Three times a day (TID) | ORAL | Status: DC
  Administered 2022-06-17 (×3): 30 g
  Filled 2022-06-17 (×3): qty 45

## 2022-06-17 MED ORDER — ALBUMIN HUMAN 25 % IV SOLN
12.5000 g | Freq: Four times a day (QID) | INTRAVENOUS | Status: AC
  Administered 2022-06-17 – 2022-06-18 (×4): 12.5 g via INTRAVENOUS
  Filled 2022-06-17 (×4): qty 50

## 2022-06-17 MED ORDER — SODIUM CHLORIDE 0.9 % IV SOLN
200.0000 mg | Freq: Once | INTRAVENOUS | Status: AC
  Administered 2022-06-17: 200 mg via INTRAVENOUS
  Filled 2022-06-17: qty 20

## 2022-06-17 MED ORDER — MIDAZOLAM-SODIUM CHLORIDE 100-0.9 MG/100ML-% IV SOLN
15.0000 mg/h | INTRAVENOUS | Status: DC
  Administered 2022-06-17: 15 mg/h via INTRAVENOUS
  Filled 2022-06-17: qty 100

## 2022-06-17 NOTE — Progress Notes (Addendum)
[facility] for Infectious Disease    Date of Admission:  06/08/2022             ID: EXIE CHRISMER is a 51 y.o. 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 She had gtc seizure 11/25. Wbc also rising She was reintubated 11/25  Her abd ct showed minimal fluid low suspicion for sbp.  Her cxr is stable and she doesn't need much vent support and the respiratory cx from 11/25 is ngtd  Blood cx from 11/25 is also ngtd    Medications:   Chlorhexidine Gluconate Cloth  6 each Topical Daily   folic acid  1 mg Intravenous Daily   heparin  5,000 Units Subcutaneous Q8H   insulin aspart  0-15 Units Subcutaneous Q4H   lactulose  30 g Per Tube TID   lactulose  300 mL Rectal TID   leptospermum manuka honey  1 Application Topical Daily   levOCARNitine  50 mg/kg/day Intravenous Q6H   midodrine  10 mg Per Tube Q8H   mouth rinse  15 mL Mouth Rinse Q2H   pantoprazole (PROTONIX) IV  40 mg Intravenous Q24H   rifaximin  550 mg Per Tube BID   sodium chloride flush  10-40 mL Intracatheter Q12H   sodium zirconium cyclosilicate  10 g Per Tube TID   thiamine (VITAMIN B1) injection  100 mg Intravenous Daily    Objective: Vital signs in last 24 hours: Temp:  [97.2 F (36.2 C)-100.8 F (38.2 C)] 99.1 F (37.3 C) (11/26 0830) Pulse Rate:  [102-123] 110 (11/26 0830) Resp:  [0-42] 23 (11/26 0830) BP: (70-164)/(46-122) 143/65 (11/26 0830) SpO2:  [95 %-100 %] 96 % (11/26 0830) FiO2 (%):  [40 %-100  %] 40 % (11/26 0816) Weight:  [64.5 kg] 64.5 kg (11/26 0400)  Physical Exam  General/constitutional: ill appearing. anasarca HEENT: Normocephalic, PER; ett intact CV: rrr no mrg Lungs: clear to auscultation, normal respiratory effort Abd: Soft, Nontender Ext: no edema Skin: scattered echymoses/purpura Neuro: sedated/comatose MSK: no peripheral joint swelling/tenderness/warmth   Central line presence: right upper picc site no erythema/purulence     Lab Results Recent Labs    06/16/22 0335 06/16/22 0826 06/17/22 0253  WBC 20.6*  --  23.5*  HGB 10.2*  --  9.8*  HCT 32.7*  --  31.1*  NA 148* 143 141  K 4.7 3.8 5.3*  CL 102 98 94*  CO2 31 25 26   BUN 76* 68* 74*  CREATININE 2.30* 2.24* 2.61*   Liver Panel Recent Labs    06/16/22 0335 06/17/22 0253  PROT 7.1 7.4  ALBUMIN 2.9* 3.8  AST 62* 60*  ALT 21 21  ALKPHOS 108 107  BILITOT 2.8* 3.3*   Sedimentation Rate No results for input(s): "ESRSEDRATE" in the last 72 hours. C-Reactive Protein No results for input(s): "CRP" in the last 72 hours.  Microbiology: reviewed Studies/Results: DG Abd Portable 1V-Small Bowel Obstruction Protocol-24 hr delay  Result Date: 06/17/2022 CLINICAL DATA:  Nasogastric tube placement. EXAM:  PORTABLE ABDOMEN - 1 VIEW COMPARISON:  June 16, 2022. FINDINGS: Distal tip of nasogastric tube is seen in expected position of distal stomach. Distal tip of feeding tube is seen in expected position of proximal duodenum. Continued small bowel dilatation is noted. IMPRESSION: Stable position of nasogastric and feeding tubes. Grossly stable small bowel dilatation. Electronically Signed   By: Marijo Conception M.D.   On: 06/17/2022 08:33   Overnight EEG with video  Result Date: 06/17/2022 Lora Havens, MD     06/17/2022  7:46 AM Patient Name: ELLISE KOVACK MRN: [id number] Epilepsy Attending: Lora Havens Referring Physician/Provider: Donnetta Simpers, MD Duration: 06/17/2022 0513 to  06/17/2022 0745 Patient history: 51yo F with GTC seizure. EEG to evaluate for seizure. Level of alertness: comatose AEDs during EEG study: LEV, versed Technical aspects: This EEG study was done with scalp electrodes positioned according to the 10-20 International system of electrode placement. Electrical activity was reviewed with band pass filter of 1-70Hz , sensitivity of 7 uV/mm, display speed of 29mm/sec with a 60Hz  notched filter applied as appropriate. EEG data were recorded continuously and digitally stored.  Video monitoring was available and reviewed as appropriate. Description: EEG showed burst suppression pattern with burst of 5-7hz  theta slowing lasting 1 seconds alternating with 2-5 seconds of generalized suppression. Hyperventilation and photic stimulation were not performed.   ABNORMALITY - Burst suppression, generalized IMPRESSION: This study is suggestive of profound diffuse encephalopathy, nonspecific etiology but likely related to sedation. No seizures or epileptiform discharges were seen throughout the recording. Priyanka Barbra Sarks   CT HEAD WO CONTRAST (5MM)  Result Date: 06/17/2022 CLINICAL DATA:  Seizure, new onset. Meningitis/CNS infection suspected. Mental status change, unknown cause. EXAM: CT HEAD WITHOUT CONTRAST TECHNIQUE: Contiguous axial images were obtained from the base of the skull through the vertex without intravenous contrast. RADIATION DOSE REDUCTION: This exam was performed according to the departmental dose-optimization program which includes automated exposure control, adjustment of the mA and/or kV according to patient size and/or use of iterative reconstruction technique. COMPARISON:  06/17/2022. FINDINGS: Brain: No acute intracranial hemorrhage, midline shift or mass effect. No extra-axial fluid collection. Mild atrophy is noted. Periventricular white matter hypodensities are present bilaterally. No hydrocephalus. Vascular: No hyperdense vessel or unexpected  calcification. Skull: Normal. Negative for fracture or focal lesion. Sinuses/Orbits: No acute finding. Other: Tubes are present in the nasopharynx. A few opacities are noted in the mastoid air cells on the right. IMPRESSION: 1. No acute intracranial hemorrhage. 2. Atrophy with chronic microvascular ischemic changes. Electronically Signed   By: Brett Fairy M.D.   On: 06/17/2022 04:55   DG Abd 1 View  Result Date: 06/16/2022 CLINICAL DATA:  Evaluate NG tube placement EXAM: ABDOMEN - 1 VIEW COMPARISON:  June 16, 2022 FINDINGS: The NG tube terminates in the distal stomach. The feeding tube terminates in the distal second portion of the duodenum. Continued small bowel obstruction with dilated loops of small bowel. IMPRESSION: 1. The NG tube terminates in the distal stomach. The feeding tube terminates in the distal second portion of the duodenum. 2. Continued small bowel obstruction. Electronically Signed   By: Dorise Bullion III M.D.   On: 06/16/2022 12:57   DG Abd Portable 1V-Small Bowel Obstruction Protocol-initial, 8 hr delay  Result Date: 06/16/2022 CLINICAL DATA:  Small bowel obstruction. EXAM: PORTABLE ABDOMEN - 1 VIEW COMPARISON:  KUB June 15, 2022. CT the abdomen and pelvis June 15, 2022. FINDINGS: The feeding tube is been advanced and appears to terminate  in the distal second portion of the duodenum. The NG tube terminates in the stomach. Dilated loops of small bowel are identified throughout the abdomen consistent with on going small-bowel obstruction. IMPRESSION: 1. Support apparatus as above. 2. Continued small bowel obstruction. Electronically Signed   By: Dorise Bullion III M.D.   On: 06/16/2022 11:52   CT ABDOMEN PELVIS WO CONTRAST  Result Date: 06/15/2022 CLINICAL DATA:  Dilated bowel loops on recent plain film EXAM: CT ABDOMEN AND PELVIS WITHOUT CONTRAST TECHNIQUE: Multidetector CT imaging of the abdomen and pelvis was performed following the standard protocol without IV  contrast. RADIATION DOSE REDUCTION: This exam was performed according to the departmental dose-optimization program which includes automated exposure control, adjustment of the mA and/or kV according to patient size and/or use of iterative reconstruction technique. COMPARISON:  06/05/2022 FINDINGS: Lower chest: Bilateral lower lobe consolidation is noted with moderate left-sided pleural effusion. This is stable from the prior exam. Hepatobiliary: Small hypodensity is noted within the liver near the dome of the liver stable from the prior exam likely representing a small cyst. Mild nodularity of the liver is noted consistent with underlying cirrhosis. Gallbladder is within normal limits. Dependent density is noted within the gallbladder likely representing sludge. No definitive gallstones are seen. Mild perihepatic fluid is seen. Pancreas: Unremarkable. No pancreatic ductal dilatation or surrounding inflammatory changes. Spleen: Normal in size without focal abnormality. Adrenals/Urinary Tract: Adrenal glands are within normal limits. Kidneys are unremarkable. No renal calculi or obstructive changes are noted. The bladder is partially distended. Stomach/Bowel: Rectal tube is noted in place. The colon shows mild diverticular change without obstructive process. Air is noted throughout the transverse colon. The appendix is not visualized consistent with a prior surgical history. Stomach is distended with fluid despite gastric catheters. The weighted feeding catheter extends towards the pylorus. Multiple dilated loops of small bowel are noted extending from the duodenum to the mid to distal ileum. Fecalization of small bowel contents is noted. A transition zone is seen in the anterior abdomen best noted on image number 57 of series 3. Changes may be related to adhesions. The distal most small bowel is within normal limits. Vascular/Lymphatic: No significant vascular findings are present. No enlarged abdominal or pelvic  lymph nodes. Reproductive: Uterus and bilateral adnexa are unremarkable. Other: Mild free fluid is noted within the abdomen likely related to the underlying cirrhosis although some may be reactive to the small bowel abnormality. No free air is seen. Musculoskeletal: Degenerative changes of lumbar spine are noted. IMPRESSION: Multiple dilated loops of small bowel with a transition zone in the mid to distal ileum consistent with a least partial small bowel obstruction. Bibasilar consolidation with left-sided pleural effusions stable from the prior CT. Cirrhotic changes of the liver with ascites. Ascites may also be in part due to the underlying obstructive change. Electronically Signed   By: Inez Catalina M.D.   On: 06/15/2022 23:30   DG CHEST PORT 1 VIEW  Result Date: 06/15/2022 CLINICAL DATA:  Endotracheal tube and feeding tube placement. EXAM: PORTABLE CHEST 1 VIEW COMPARISON:  None Available. FINDINGS: Endotracheal tube with distal tip in the right mainstem bronchus, it need to be retracted 4-5 cm. Feeding tube coursing below the diaphragm with distal tip not included. Heart is normal in size. Low lung volumes with bibasilar opacities suggesting atelectasis or infiltrate. IMPRESSION: 1. Endotracheal tube with distal tip in the right mainstem bronchus, it need to be retracted 3-4 cm. 2. Low lung volumes with bibasilar opacities suggesting  atelectasis or infiltrate. Electronically Signed   By: Keane Police D.O.   On: 06/15/2022 22:13   DG Abd Portable 1V  Result Date: 06/15/2022 CLINICAL DATA:  Check feeding catheter placement EXAM: PORTABLE ABDOMEN - 1 VIEW COMPARISON:  None Available. FINDINGS: Gastric catheter is noted within the stomach. Weighted feeding catheter is noted in the distal stomach near the pyloric channel. Multiple dilated loops of small bowel are noted suspicious for small bowel obstruction. No free air is noted. IMPRESSION: Gastric catheters as described. Electronically Signed   By: Inez Catalina M.D.   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  Previously planned 2 week treatment although given cirrhosis and tenuous status I would extend abx treatment to 3 weeks 11/08-11/29 -no clear clinical superiority of gent combination over amp monotherapy so if kidney function becomes a concern ok to stop gentamicin (done 11/24 in setting also adding linezolid for ongoing mssa coverage) -recent worsening mentation and new seizure 11/25 --> see below regarding seizure/reintubation-ams/risign wbc   #Rising wbc/worsening mentation 11/24 #Mssa/msse infection Will plan to treat 4 weeks for complicated mssa infection in setting cirrhosis/community acquired; linezolid added to ampicillin as prolonged non-cefazolin/nafcillin mssa treatment has higher failure rate  Reintubated 11/25 and generallized toninc clonic seizure noted as well. Wbc continues to rise  11/25 bcx and resp cx ngtd Cxr stable lung and vent stable  Imaging abd only trace ascites and admission ascite analysis not consistent with sbp  11/26 assessment Febrile 11/25 Seizure ?around that time of fever Wbc trended up the last 3 days prior to linezolid initiation I don't think serotonin syndrome. I have low suspicion this is uti and there is no other sign of infectious syndrome  Listeria meningitis has very high failure/relapse rate  and she needs to have repeat LP to assess for this in setting of what is currently going on   -f/u final bcx repeat -continue amp/linezolid; 11/29 plan to resume cefazolin to inish mssa coverage (when listeriosis tx is done) -I agree tee when able -discussed with neurology and pulm/ccm about repeat LP for cell count/diff/glucose/protein/gram-stain and  culture and pcr panel   #Ileus/sbo 11/24 ct with sbo transition point noted vs ileus; general surgery following 11/24 Ngt placed --> bile retrieved; tf hold Will keep abx iv for this reason She is on rifaximin   I spent 50 minute reviewing data/chart, and coordinating care and >50% direct face to face time providing counseling/discussing diagnostics/treatment plan with patient     Jabier Mutton, [place] [facility] [place] [phone]  pager   [phone] cell   06/17/2022, 12:00 PM

## 2022-06-17 NOTE — Procedures (Addendum)
Patient Name: Deanna Mcmillan  MRN: [id number]  Epilepsy Attending: Lora Havens  Referring Physician/Provider: Donnetta Simpers, MD  Duration: 06/17/2022 4037 to 06/18/2022 0964  Patient history: 51yo F with GTC seizure. EEG to evaluate for seizure.  Level of alertness: comatose  AEDs during EEG study: LEV, Propofol  Technical aspects: This EEG study was done with scalp electrodes positioned according to the 10-20 International system of electrode placement. Electrical activity was reviewed with band pass filter of 1-70Hz , sensitivity of 7 uV/mm, display speed of 33mm/sec with a 60Hz  notched filter applied as appropriate. EEG data were recorded continuously and digitally stored.  Video monitoring was available and reviewed as appropriate.  Description: EEG showed burst suppression pattern with burst of 5-7hz  theta slowing lasting 1 seconds alternating with 2-5 seconds of generalized suppression. Hyperventilation and photic stimulation were not performed.     Of note, study was technically difficult to interpret due to significant electrode artifact.  ABNORMALITY - Burst suppression, generalized  IMPRESSION: This technically difficult study is suggestive of profound diffuse encephalopathy, nonspecific etiology but likely related to sedation. No seizures or epileptiform discharges were seen throughout the recording.  Braian Tijerina Barbra Sarks

## 2022-06-17 NOTE — Consult Note (Signed)
Nephrology Consult   Requesting provider: Brand Males Service requesting consult: CCM Reason for consult: AKI   Assessment/Recommendations: Deanna Mcmillan is a/an 51 y.o. female with a past medical history alcoholic cirrhosis who present w/ sepsis now c/b AKI   Non-Oliguric AKI: Likely secondary to ATN associated with shock. Does not appear dehydrated on exam. Foley in place. -Management of shock as below -Continue to monitor daily Cr, Dose meds for GFR -Monitor Daily I/Os, Daily weight  -Maintain MAP>65 for optimal renal perfusion.  -Avoid nephrotoxic medications including NSAIDs -Use synthetic opioids (Fentanyl/Dilaudid) if needed -Currently no indication for HD  Septic shock: Continue Levophed per primary team.  Antibiotics per primary team.  AMS/seizures: Using lactulose and rifaximin.  Neurology involved.  Alcoholic cirrhosis: Complicates overall picture.  Continue with supportive care  Ileus: Continues to have some stool passage despite imaging with transition zone.  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  Continue antibiotics  Acute hypoxic respiratory failure: Ventilatory support per primary team   Recommendations conveyed to primary service.    [facility] 06/17/2022 3:07 PM   _____________________________________________________________________________________ CC: sepsis aki  History of Present Illness: Deanna Mcmillan is a/an 51 y.o. female with a past medical history of alcoholic cirrhosis who presents with sepsis now c/b AKI.  Patient has had a fairly prolonged hospital stay.  History was obtained per chart review and family.  Patient initially presented 18 days ago.  She had a recent history of alcoholic hepatitis in October of this year.  Otherwise she was in her normal state of health but the night before she came she developed a headache and developed  altered mental status.  In the hospital she was found to be septic.  Because of her altered mental status she was intubated at that time.  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 She was started on treatment. 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 Developed an ileus early in the hospitalization but thi resolved. Then she was extubated on 11/22 and was doing okay on nasal cannular and briefly was off norepinephrine. Then around 11/24 she worsened again requiring intubated. Further complications include seizures last night and increasing pressor requirements now on 84mcg of norepi. UOP has dropped off but still with about 500cc/day. She developed recurrent ileus and now has NG tube in place again. Crt was 0.6 on 11/23 and started rising the next day now up to 2.6.   Medications:  Current Facility-Administered Medications  Medication Dose Route Frequency Provider Last Rate Last Admin   0.9 %  sodium chloride infusion   Intravenous PRN Noemi Chapel P, DO   Stopped at 06/14/22 0834   albumin human 25 % solution 12.5 g  12.5 g Intravenous Q6H Brand Males, MD 60 mL/hr at 06/17/22 1300 Infusion Verify at 06/17/22 1300   ampicillin (OMNIPEN) 2 g in sodium chloride 0.9 % 100 mL IVPB  2 g Intravenous Q8H Henri Medal, RPH 300 mL/hr at 06/17/22 1332 2 g at 06/17/22 1332   [START ON 06/20/2022] ceFAZolin (ANCEF) IVPB 2g/100 mL premix  2 g Intravenous Q8H Vu, Rockey Situ, MD       Chlorhexidine Gluconate Cloth 2 % PADS 6 each  6 each Topical Daily Candee Furbish, MD   6 each at 06/17/22 1045   dextrose 5 % solution   Intravenous Continuous Brand Males, MD 10 mL/hr at 06/17/22 1300 Infusion Verify at 06/17/22 1300  fentaNYL (SUBLIMAZE) injection 50-200 mcg  50-200 mcg Intravenous Q30 min PRN Chesley Mires, MD   50 mcg at 41/93/79 0240   folic acid injection 1 mg  1 mg Intravenous Daily Chesley Mires, MD   1 mg at 06/17/22 0920   heparin injection 5,000 Units  5,000 Units  Subcutaneous Q8H Corey Harold, NP   5,000 Units at 06/17/22 1330   insulin aspart (novoLOG) injection 0-15 Units  0-15 Units Subcutaneous Q4H Frederik Pear, MD   3 Units at 06/17/22 1218   lactulose (CHRONULAC) 10 GM/15ML solution 30 g  30 g Per Tube TID Brand Males, MD   30 g at 06/17/22 1057   lactulose (CHRONULAC) enema 200 gm  300 mL Rectal TID Donnetta Simpers, MD   300 mL at 06/17/22 0921   leptospermum manuka honey (MEDIHONEY) paste 1 Application  1 Application Topical Daily Brand Males, MD   1 Application at 97/35/32 0937   levETIRAcetam (KEPPRA) IVPB 1000 mg/100 mL premix  1,000 mg Intravenous BID Donnetta Simpers, MD   Stopped at 06/17/22 0940   levOCARNitine (CARNITOR) 200 MG/ML injection 800 mg  50 mg/kg/day Intravenous Q6H Donnetta Simpers, MD   800 mg at 06/17/22 1056   linezolid (ZYVOX) IVPB 600 mg  600 mg Intravenous Q12H Brand Males, MD   Stopped at 06/17/22 1053   lip balm (CARMEX) ointment   Topical PRN Virl Axe, MD       midodrine (PROAMATINE) tablet 10 mg  10 mg Per Tube Q8H Ramaswamy, Murali, MD   10 mg at 06/17/22 1330   norepinephrine (LEVOPHED) 16 mg in 211mL (0.064 mg/mL) premix infusion  0-40 mcg/min Intravenous Titrated Brand Males, MD 23.4 mL/hr at 06/17/22 1354 25 mcg/min at 06/17/22 1354   Oral care mouth rinse  15 mL Mouth Rinse Q2H Sood, Vineet, MD   15 mL at 06/17/22 1432   Oral care mouth rinse  15 mL Mouth Rinse PRN Chesley Mires, MD       pantoprazole (PROTONIX) injection 40 mg  40 mg Intravenous Q24H Chesley Mires, MD   40 mg at 06/16/22 2138   propofol (DIPRIVAN) 1000 MG/100ML infusion  0-50 mcg/kg/min Intravenous Continuous Greta Doom, MD 15.48 mL/hr at 06/17/22 1430 40 mcg/kg/min at 06/17/22 1430   rifaximin (XIFAXAN) tablet 550 mg  550 mg Per Tube BID Brand Males, MD   550 mg at 06/17/22 1057   sodium chloride flush (NS) 0.9 % injection 10-40 mL  10-40 mL Intracatheter Q12H Noemi Chapel P, DO   10 mL  at 06/17/22 9924   sodium chloride flush (NS) 0.9 % injection 10-40 mL  10-40 mL Intracatheter PRN Noemi Chapel P, DO       sodium zirconium cyclosilicate (LOKELMA) packet 10 g  10 g Per Tube TID Brand Males, MD   10 g at 06/17/22 1057   thiamine (VITAMIN B1) injection 100 mg  100 mg Intravenous Daily Chesley Mires, MD   100 mg at 06/17/22 0920   white petrolatum (VASELINE) gel   Topical PRN Julian Hy, DO   1 Application at 26/83/41 0809     ALLERGIES Patient has no known allergies.  MEDICAL HISTORY No past medical history on file.   SOCIAL HISTORY Social History   Socioeconomic History   Marital status: Divorced    Spouse name: Not on file   Number of children: Not on file   Years of education: Not on file   Highest education level: Not on file  Occupational History   Not on file  Tobacco Use   Smoking status: Never   Smokeless tobacco: Not on file  Substance and Sexual Activity   Alcohol use: Yes    Comment: daily one glass of wine   Drug use: No   Sexual activity: Not on file  Other Topics Concern   Not on file  Social History Narrative   Not on file   Social Determinants of Health   Financial Resource Strain: Not on file  Food Insecurity: No Food Insecurity (05/11/2022)   Hunger Vital Sign    Worried About Running Out of Food in the Last Year: Never true    Ran Out of Food in the Last Year: Never true  Transportation Needs: No Transportation Needs (05/11/2022)   PRAPARE - Hydrologist (Medical): No    Lack of Transportation (Non-Medical): No  Physical Activity: Not on file  Stress: Not on file  Social Connections: Not on file  Intimate Partner Violence: Not At Risk (05/11/2022)   Humiliation, Afraid, Rape, and Kick questionnaire    Fear of Current or Ex-Partner: No    Emotionally Abused: No    Physically Abused: No    Sexually Abused: No     FAMILY HISTORY No family history on file.  Unable to obtain due to the  patient's sedation   Review of Systems: Unable to obtain due to the patient's sedation  Physical Exam: Vitals:   06/17/22 1300 06/17/22 1439  BP: (!) 144/66 134/67  Pulse: (!) 116   Resp: (!) 25   Temp: 99.5 F (37.5 C)   SpO2: 96%    Total I/O In: 985.4 [I.V.:535.4; IV Piggyback:450] Out: 850 [Urine:150; Stool:700]  Intake/Output Summary (Last 24 hours) at 06/17/2022 1507 Last data filed at 06/17/2022 1300 Gross per 24 hour  Intake 3927.49 ml  Output 2175 ml  Net 1752.49 ml   General: Alert and, lying in bed, no distress HEENT: anicteric sclera, NG tube and ET tube in place CV: Tachycardia cardia with no murmur or rub, 1+ edema at the sacrum Lungs: clear to auscultation bilaterally, normal work of breathing Abd: Moderate distention, bowel sounds present Skin: no visible lesions or rashes Psych: Sedated, not interactive Musculoskeletal: no obvious deformities Neuro: Sedated, does not interact, does not answer questions  Test Results Reviewed Lab Results  Component Value Date   NA 141 06/17/2022   K 5.3 (H) 06/17/2022   CL 94 (L) 06/17/2022   CO2 26 06/17/2022   BUN 74 (H) 06/17/2022   CREATININE 2.61 (H) 06/17/2022   CALCIUM 8.3 (L) 06/17/2022   ALBUMIN 3.8 06/17/2022   PHOS 8.1 (H) 06/17/2022    CBC Recent Labs  Lab 06/13/22 0450 06/14/22 0306 06/15/22 0403 06/15/22 1037 06/15/22 2238 06/16/22 0335 06/17/22 0253  WBC 9.5 10.8* 13.1*  --   --  20.6* 23.5*  NEUTROABS 6.9 8.1* 9.6*  --   --   --   --   HGB 8.9* 9.1* 10.5*   < > 10.9* 10.2* 9.8*  HCT 29.2* 30.0* 32.6*   < > 32.0* 32.7* 31.1*  MCV 120.2* 121.0* 119.0*  --   --  118.9* 118.7*  PLT 229 216 277  --   --  361 324   < > = values in this interval not displayed.    I have reviewed all relevant outside healthcare records related to the patient's current hospitalization

## 2022-06-17 NOTE — Progress Notes (Signed)
Pt transported to CT and back on vent without complications

## 2022-06-17 NOTE — Progress Notes (Signed)
PHARMACY NOTE:  ANTIMICROBIAL RENAL DOSAGE ADJUSTMENT  Current antimicrobial regimen includes a mismatch between antimicrobial dosage and estimated renal function.  As per policy approved by the Pharmacy & Therapeutics and Medical Executive Committees, the antimicrobial dosage will be adjusted accordingly.  Current antimicrobial dosage:  ampicillin 2g q6h   Indication: bacteremia  Renal Function:  Estimated Creatinine Clearance: 21.8 mL/min (A) (by C-G formula based on SCr of 2.61 mg/dL (H)).    Antimicrobial dosage has been changed to:  ampicillin 2g q8h   Additional comments:   Thank you for allowing pharmacy to be a part of this patient's care.  Cristela Felt, PharmD, BCPS Clinical Pharmacist 06/17/2022 7:29 AM

## 2022-06-17 NOTE — Progress Notes (Signed)
LTM EEG hooked up and running - no initial skin breakdown - push button tested - [facility] monitoring.  

## 2022-06-17 NOTE — Progress Notes (Signed)
Vent patient transported from 2M11 to MRI without any complications.

## 2022-06-17 NOTE — Consult Note (Signed)
NEUROLOGY CONSULTATION NOTE   Date of service: June 17, 2022 Patient Name: Deanna Mcmillan MRN:  [id number] DOB:  [birth year] Reason for consult: "Hperammonemia and seizure" Requesting Provider: Brand Males, MD _ _ _   _ __   _ __ _ _  __ __   _ __   __ _  History of Present Illness  Deanna Mcmillan is a 51 y.o. female with PMH significant for recent diagnosis of alcoholic hepatitis (dx 76/72), admitted initially to the ICU with meningitis, hepatic failure, hyperammonemia and stablizes and transferred out. Hospitalization more recently complicated by ileus vs partial SBO and transferred back to the ICU and intubated.  Overnight, she was noted to have generalized tonic clonic seizure that started at 0338 on 06/17/22. She was given Versed 4mg  total and started on Versed gtt. The noted rhythmic movements went from full body GTC to ust LLE rhythmic movements. She was also loaded up with Keppra with persistent movement in the LLE.   ROS   Unable to obtain 2/2 obtunded mentation.  Past History  No past medical history on file. Past Surgical History:  Procedure Laterality Date   KNEE ARTHROSCOPY W/ ACL RECONSTRUCTION Left 1994/5   Dr. Weston Anna   LAPAROSCOPIC APPENDECTOMY N/A 04/11/2013   Procedure: APPENDECTOMY LAPAROSCOPIC;  Surgeon: Stark Klein, MD;  Location: [facility];  Service: General;  Laterality: N/A;   [place]   3 extractions   No family history on file. Social History   Socioeconomic History   Marital status: Divorced    Spouse name: Not on file   Number of children: Not on file   Years of education: Not on file   Highest education level: Not on file  Occupational History   Not on file  Tobacco Use   Smoking status: Never   Smokeless tobacco: Not on file  Substance and Sexual Activity   Alcohol use: Yes    Comment: daily one glass of wine   Drug use: No   Sexual activity: Not on file  Other Topics Concern   Not on file  Social  History Narrative   Not on file   Social Determinants of Health   Financial Resource Strain: Not on file  Food Insecurity: No Food Insecurity (05/11/2022)   Hunger Vital Sign    Worried About Running Out of Food in the Last Year: Never true    Ran Out of Food in the Last Year: Never true  Transportation Needs: No Transportation Needs (05/11/2022)   PRAPARE - Hydrologist (Medical): No    Lack of Transportation (Non-Medical): No  Physical Activity: Not on file  Stress: Not on file  Social Connections: Not on file   No Known Allergies  Medications   Medications Prior to Admission  Medication Sig Dispense Refill Last Dose   acetaminophen (TYLENOL) 325 MG tablet Take 650 mg by mouth every 6 (six) hours as needed for headache or moderate pain.   03/27/7095   folic acid (FOLVITE) 1 MG tablet Take 1 tablet (1 mg total) by mouth daily.   05/28/2022   furosemide (LASIX) 40 MG tablet Take 1 tablet (40 mg total) by mouth daily. 30 tablet 11 05/28/2022   ibuprofen (ADVIL) 200 MG tablet Take 400 mg by mouth every 6 (six) hours as needed for headache or moderate pain.   05/28/2022   KLOR-CON M20 20 MEQ tablet Take 20 mEq by mouth daily.   05/28/2022  lactulose (CHRONULAC) 10 GM/15ML solution Take 30 mLs (20 g total) by mouth 3 (three) times daily. 236 mL 3 05/28/2022   Multiple Vitamin (MULTIVITAMIN WITH MINERALS) TABS tablet Take 1 tablet by mouth daily.   05/28/2022   pantoprazole (PROTONIX) 40 MG tablet Take 1 tablet (40 mg total) by mouth daily. 30 tablet 11 05/28/2022   polyethylene glycol (MIRALAX / GLYCOLAX) 17 g packet Take 17 g by mouth daily as needed for mild constipation. 14 each 0 05/28/2022   prednisoLONE 5 MG TABS tablet Take 40 mg by mouth daily.   05/28/2022   predniSONE (DELTASONE) 20 MG tablet Take 40 mg by mouth daily.   05/28/2022   simethicone (MYLICON) 80 MG chewable tablet Chew 1 tablet (80 mg total) by mouth every 6 (six) hours as needed for flatulence.  30 tablet 0 Past Month   spironolactone (ALDACTONE) 50 MG tablet Take 1 tablet (50 mg total) by mouth daily. 30 tablet 0 05/28/2022   thiamine (VITAMIN B-1) 100 MG tablet Take 1 tablet (100 mg total) by mouth daily.   05/28/2022     Vitals   Vitals:   06/16/22 2215 06/16/22 2230 06/16/22 2245 06/16/22 2300  BP: 137/66 139/65 (!) 142/67 137/66  Pulse: (!) 110 (!) 110 (!) 111 (!) 112  Resp: (!) 21 (!) 22 20 (!) 21  Temp: 98.2 F (36.8 C) 98.2 F (36.8 C) 98.2 F (36.8 C) 98.2 F (36.8 C)  TempSrc:      SpO2: 98% 99% 99% 99%  Weight:         Body mass index is 24.41 kg/m.  Physical Exam   General: Laying comfortably in bed; intubated. HENT: Normal oropharynx and mucosa. Normal external appearance of ears and nose.  Neck: Supple, no pain or tenderness  CV: No JVD. No peripheral edema.  Pulmonary: Symmetric Chest rise. Normal respiratory effort.  Abdomen: Soft to touch, non-tender.  Ext: No cyanosis, edema, or deformity  Skin: No rash. Normal palpation of skin. Bruising in BL upper extremities. Musculoskeletal: Normal digits and nails by inspection. No clubbing.  Neurologic Examination  Mental status/Cognition: Obtunded mentation, she does not open her eyes to voice or to loud clap or to tactile stimulation.  She does not follow any commands. Speech/language: Unable to assess due to encephalopathy and obtunded mentation.  She does not follow any commands, no attempts to communicate at this time.   Cranial nerves:   CN II Pupils equal and reactive to light, no VF deficit   CN III,IV,VI EOM intact to dolls eyes   CN V Corneals absent BL   CN VII Facial diplegia.   CN VIII Does not turn head towards speech.   CN IX & X Cough intact, gag absent.   CN XI Head is midline.   CN XII midline tongue but does not protrude on command.   Motor/sensory:  Muscle bulk: Decreased, tone flaccid in all extremities, mildly increased in left lower extremity.  Noted to have rhythmic jerking of  her left calf at about 2 to 3 Hz a second and very concerning for a clinical seizure.  No response to proximal pinch in any of the extremities.  No spontaneous movement noted in any of the extremities except for the rhythmic jerking in her left calf concerning for a seizure.  Reflexes:  Right Left Comments  Pectoralis      Biceps (C5/6) 2 2   Brachioradialis (C5/6)  2    Triceps (C6/7) 2 2  Patellar (L3/4) 1 1    Achilles (S1)      Hoffman      Plantar     Jaw jerk    Coordination/Complex Motor:  -Unable to assess.  Labs   CBC:  Recent Labs  Lab 06/14/22 0306 06/15/22 0403 06/15/22 1037 06/16/22 0335 06/17/22 0253  WBC 10.8* 13.1*  --  20.6* 23.5*  NEUTROABS 8.1* 9.6*  --   --   --   HGB 9.1* 10.5*   < > 10.2* 9.8*  HCT 30.0* 32.6*   < > 32.7* 31.1*  MCV 121.0* 119.0*  --  118.9* 118.7*  PLT 216 277  --  361 324   < > = values in this interval not displayed.    Basic Metabolic Panel:  Lab Results  Component Value Date   NA 143 06/16/2022   K 3.8 06/16/2022   CO2 25 06/16/2022   GLUCOSE 208 (H) 06/16/2022   BUN 68 (H) 06/16/2022   CREATININE 2.24 (H) 06/16/2022   CALCIUM 7.8 (L) 06/16/2022   GFRNONAA 26 (L) 06/16/2022   GFRAA >90 04/12/2013   Lipid Panel:  Lab Results  Component Value Date   LDLCALC NOT CALCULATED 04/25/2022   HgbA1c:  Lab Results  Component Value Date   HGBA1C <4.2 (L) 04/25/2022   Urine Drug Screen:     Component Value Date/Time   LABOPIA NONE DETECTED 04/23/2022 0253   COCAINSCRNUR NONE DETECTED 04/23/2022 0253   LABBENZ NONE DETECTED 04/23/2022 0253   AMPHETMU NONE DETECTED 04/23/2022 0253   THCU NONE DETECTED 04/23/2022 0253   LABBARB NONE DETECTED 04/23/2022 0253    Alcohol Level     Component Value Date/Time   ETH <10 04/23/2022 0453    CT Head without contrast: CTH was negative for a large hypodensity concerning for a large territory infarct or hyperdensity concerning for an ICH  MRI Brain: pending  cEEG:   pending  Impression   SHERYLL DYMEK is a 51 y.o. female with PMH significant for recent diagnosis of alcoholic hepatitis (dx 16/10), admitted initially to the ICU with meningitis, hepatic failure, hyperammonemia and stablizes and transferred out. Hospitalization more recently complicated by ileus vs partial SBO and transferred back to the ICU and intubated.  Overnight, she was noted to have generalized tonic clonic seizure.  She was given Versed, loaded with Keppra and started on Versed gtt and maintenance Keppra but noted to have continuous left calf rhythmic jerking concerning for persistent seizure.  Of note, ammonia continues to trend up and is at 116. Likely a combination of hepatic dysfunction and bacterial overgrowth from ileus vs pSBO.  Recommendations  - STAT CTH w/o C - next steps will load with Vimpat 200mg  IV once. - increase Versed to 15/hr - STAT cEEG with MRI safe leads. - Levocarnitine 50mg /Kg/ day divided into TID dosing for 3 doses only and then stop. Repeat Ammonia levels to assess response. - increased lactulose enema to TID. Unable to take PO given concern for ileus vs pSBO. ______________________________________________________________________  This patient is critically ill and at significant risk of neurological worsening, death and care requires constant monitoring of vital signs, hemodynamics,respiratory and cardiac monitoring, neurological assessment, discussion with family, other specialists and medical decision making of high complexity. I spent 50 minutes of neurocritical care time  in the care of  this patient. This was time spent independent of any time provided by nurse practitioner or PA.  Donnetta Simpers [facility] Pager Number [phone] 06/17/2022  4:25 AM  Thank you for the opportunity to take part in the care of this patient. If you have any further questions, please contact the neurology consultation  attending.  Signed,  [place] Pager Number [phone] _ _ _   _ __   _ __ _ _  __ __   _ __   __ _

## 2022-06-17 NOTE — Progress Notes (Signed)
Subjective/Chief Complaint: Seizure overnight, intubated, sedated, has output in flexiseal   Objective: Vital signs in last 24 hours: Temp:  [97.2 F (36.2 C)-100.8 F (38.2 C)] 98.8 F (37.1 C) (11/26 0700) Pulse Rate:  [99-123] 105 (11/26 0700) Resp:  [0-42] 21 (11/26 0700) BP: (70-164)/(46-122) 148/61 (11/26 0700) SpO2:  [95 %-100 %] 96 % (11/26 0700) FiO2 (%):  [40 %-100 %] 40 % (11/26 0600) Weight:  [64.5 kg] 64.5 kg (11/26 0400) Last BM Date : 06/16/22  Intake/Output from previous day: 11/25 0701 - 11/26 0700 In: 3998.5 [I.V.:2583.8; IV Piggyback:1414.7] Out: 1675 [Urine:525; Emesis/NG output:150; Stool:1000] Intake/Output this shift: No intake/output data recorded.  Ab distended but soft  Lab Results:  Recent Labs    06/16/22 0335 06/17/22 0253  WBC 20.6* 23.5*  HGB 10.2* 9.8*  HCT 32.7* 31.1*  PLT 361 324   BMET Recent Labs    06/16/22 0826 06/17/22 0253  NA 143 141  K 3.8 5.3*  CL 98 94*  CO2 25 26  GLUCOSE 208* 129*  BUN 68* 74*  CREATININE 2.24* 2.61*  CALCIUM 7.8* 8.3*   PT/INR Recent Labs    06/16/22 0335  LABPROT 17.4*  INR 1.4*   ABG Recent Labs    06/15/22 2107 06/15/22 2238  PHART 7.564* 7.502*  HCO3 34.7* 34.5*    Studies/Results: Overnight EEG with video  Result Date: 06/17/2022 Lora Havens, MD     06/17/2022  7:46 AM Patient Name: Deanna Mcmillan MRN: [id number] Epilepsy Attending: Lora Havens Referring Physician/Provider: Donnetta Simpers, MD Duration: 06/17/2022 0513 to 06/17/2022 0745 Patient history: 51yo F with GTC seizure. EEG to evaluate for seizure. Level of alertness: comatose AEDs during EEG study: LEV, versed Technical aspects: This EEG study was done with scalp electrodes positioned according to the 10-20 International system of electrode placement. Electrical activity was reviewed with band pass filter of 1-70Hz , sensitivity of 7 uV/mm, display speed of 104mm/sec with a 60Hz  notched filter  applied as appropriate. EEG data were recorded continuously and digitally stored.  Video monitoring was available and reviewed as appropriate. Description: EEG showed burst suppression pattern with burst of 5-7hz  theta slowing lasting 1 seconds alternating with 2-5 seconds of generalized suppression. Hyperventilation and photic stimulation were not performed.   ABNORMALITY - Burst suppression, generalized IMPRESSION: This study is suggestive of profound diffuse encephalopathy, nonspecific etiology but likely related to sedation. No seizures or epileptiform discharges were seen throughout the recording. Priyanka Barbra Sarks   CT HEAD WO CONTRAST (5MM)  Result Date: 06/17/2022 CLINICAL DATA:  Seizure, new onset. Meningitis/CNS infection suspected. Mental status change, unknown cause. EXAM: CT HEAD WITHOUT CONTRAST TECHNIQUE: Contiguous axial images were obtained from the base of the skull through the vertex without intravenous contrast. RADIATION DOSE REDUCTION: This exam was performed according to the departmental dose-optimization program which includes automated exposure control, adjustment of the mA and/or kV according to patient size and/or use of iterative reconstruction technique. COMPARISON:  06/20/2022. FINDINGS: Brain: No acute intracranial hemorrhage, midline shift or mass effect. No extra-axial fluid collection. Mild atrophy is noted. Periventricular white matter hypodensities are present bilaterally. No hydrocephalus. Vascular: No hyperdense vessel or unexpected calcification. Skull: Normal. Negative for fracture or focal lesion. Sinuses/Orbits: No acute finding. Other: Tubes are present in the nasopharynx. A few opacities are noted in the mastoid air cells on the right. IMPRESSION: 1. No acute intracranial hemorrhage. 2. Atrophy with chronic microvascular ischemic changes. Electronically Signed   By: Regan Rakers.D.  On: 06/17/2022 04:55   DG Abd 1 View  Result Date: 06/16/2022 CLINICAL DATA:   Evaluate NG tube placement EXAM: ABDOMEN - 1 VIEW COMPARISON:  June 16, 2022 FINDINGS: The NG tube terminates in the distal stomach. The feeding tube terminates in the distal second portion of the duodenum. Continued small bowel obstruction with dilated loops of small bowel. IMPRESSION: 1. The NG tube terminates in the distal stomach. The feeding tube terminates in the distal second portion of the duodenum. 2. Continued small bowel obstruction. Electronically Signed   By: Dorise Bullion III M.D.   On: 06/16/2022 12:57   DG Abd Portable 1V-Small Bowel Obstruction Protocol-initial, 8 hr delay  Result Date: 06/16/2022 CLINICAL DATA:  Small bowel obstruction. EXAM: PORTABLE ABDOMEN - 1 VIEW COMPARISON:  KUB June 15, 2022. CT the abdomen and pelvis June 15, 2022. FINDINGS: The feeding tube is been advanced and appears to terminate in the distal second portion of the duodenum. The NG tube terminates in the stomach. Dilated loops of small bowel are identified throughout the abdomen consistent with on going small-bowel obstruction. IMPRESSION: 1. Support apparatus as above. 2. Continued small bowel obstruction. Electronically Signed   By: Dorise Bullion III M.D.   On: 06/16/2022 11:52   CT ABDOMEN PELVIS WO CONTRAST  Result Date: 06/15/2022 CLINICAL DATA:  Dilated bowel loops on recent plain film EXAM: CT ABDOMEN AND PELVIS WITHOUT CONTRAST TECHNIQUE: Multidetector CT imaging of the abdomen and pelvis was performed following the standard protocol without IV contrast. RADIATION DOSE REDUCTION: This exam was performed according to the departmental dose-optimization program which includes automated exposure control, adjustment of the mA and/or kV according to patient size and/or use of iterative reconstruction technique. COMPARISON:  06/06/2022 FINDINGS: Lower chest: Bilateral lower lobe consolidation is noted with moderate left-sided pleural effusion. This is stable from the prior exam. Hepatobiliary:  Small hypodensity is noted within the liver near the dome of the liver stable from the prior exam likely representing a small cyst. Mild nodularity of the liver is noted consistent with underlying cirrhosis. Gallbladder is within normal limits. Dependent density is noted within the gallbladder likely representing sludge. No definitive gallstones are seen. Mild perihepatic fluid is seen. Pancreas: Unremarkable. No pancreatic ductal dilatation or surrounding inflammatory changes. Spleen: Normal in size without focal abnormality. Adrenals/Urinary Tract: Adrenal glands are within normal limits. Kidneys are unremarkable. No renal calculi or obstructive changes are noted. The bladder is partially distended. Stomach/Bowel: Rectal tube is noted in place. The colon shows mild diverticular change without obstructive process. Air is noted throughout the transverse colon. The appendix is not visualized consistent with a prior surgical history. Stomach is distended with fluid despite gastric catheters. The weighted feeding catheter extends towards the pylorus. Multiple dilated loops of small bowel are noted extending from the duodenum to the mid to distal ileum. Fecalization of small bowel contents is noted. A transition zone is seen in the anterior abdomen best noted on image number 57 of series 3. Changes may be related to adhesions. The distal most small bowel is within normal limits. Vascular/Lymphatic: No significant vascular findings are present. No enlarged abdominal or pelvic lymph nodes. Reproductive: Uterus and bilateral adnexa are unremarkable. Other: Mild free fluid is noted within the abdomen likely related to the underlying cirrhosis although some may be reactive to the small bowel abnormality. No free air is seen. Musculoskeletal: Degenerative changes of lumbar spine are noted. IMPRESSION: Multiple dilated loops of small bowel with a transition zone in the  mid to distal ileum consistent with a least partial small  bowel obstruction. Bibasilar consolidation with left-sided pleural effusions stable from the prior CT. Cirrhotic changes of the liver with ascites. Ascites may also be in part due to the underlying obstructive change. Electronically Signed   By: Inez Catalina M.D.   On: 06/15/2022 23:30   DG CHEST PORT 1 VIEW  Result Date: 06/15/2022 CLINICAL DATA:  Endotracheal tube and feeding tube placement. EXAM: PORTABLE CHEST 1 VIEW COMPARISON:  None Available. FINDINGS: Endotracheal tube with distal tip in the right mainstem bronchus, it need to be retracted 4-5 cm. Feeding tube coursing below the diaphragm with distal tip not included. Heart is normal in size. Low lung volumes with bibasilar opacities suggesting atelectasis or infiltrate. IMPRESSION: 1. Endotracheal tube with distal tip in the right mainstem bronchus, it need to be retracted 3-4 cm. 2. Low lung volumes with bibasilar opacities suggesting atelectasis or infiltrate. Electronically Signed   By: Keane Police D.O.   On: 06/15/2022 22:13   DG Abd Portable 1V  Result Date: 06/15/2022 CLINICAL DATA:  Check feeding catheter placement EXAM: PORTABLE ABDOMEN - 1 VIEW COMPARISON:  None Available. FINDINGS: Gastric catheter is noted within the stomach. Weighted feeding catheter is noted in the distal stomach near the pyloric channel. Multiple dilated loops of small bowel are noted suspicious for small bowel obstruction. No free air is noted. IMPRESSION: Gastric catheters as described. Electronically Signed   By: Inez Catalina M.D.   On: 06/15/2022 22:12   DG Abd 1 View  Result Date: 06/15/2022 CLINICAL DATA:  Ileus. EXAM: ABDOMEN - 1 VIEW COMPARISON:  June 14, 2022. FINDINGS: Distal tip of nasogastric tube is seen in expected position of stomach. Distal tip of feeding tube is seen in expected position of distal stomach. Dilated small bowel loops are noted concerning for ileus or possibly distal small bowel obstruction. IMPRESSION: Stable dilated small  bowel loops are noted concerning for ileus or possibly distal small bowel obstruction. Electronically Signed   By: Marijo Conception M.D.   On: 06/15/2022 10:22    Anti-infectives: Anti-infectives (From admission, onward)    Start     Dose/Rate Route Frequency Ordered Stop   06/20/22 0600  ceFAZolin (ANCEF) IVPB 2g/100 mL premix        2 g 200 mL/hr over 30 Minutes Intravenous Every 8 hours 06/15/22 1343     06/17/22 1400  ampicillin (OMNIPEN) 2 g in sodium chloride 0.9 % 100 mL IVPB        2 g 300 mL/hr over 20 Minutes Intravenous Every 8 hours 06/17/22 0731 06/20/22 1359   06/16/22 1200  ampicillin (OMNIPEN) 2 g in sodium chloride 0.9 % 100 mL IVPB  Status:  Discontinued        2 g 300 mL/hr over 20 Minutes Intravenous Every 6 hours 06/16/22 0813 06/17/22 0731   06/15/22 1430  linezolid (ZYVOX) IVPB 600 mg        600 mg 300 mL/hr over 60 Minutes Intravenous Every 12 hours 06/15/22 1339 06/20/22 0959   06/12/22 2000  gentamicin (GARAMYCIN) IVPB 60 mg        60 mg 100 mL/hr over 30 Minutes Intravenous Every 24 hours 06/12/22 1146 06/15/22 2209   06/11/22 1230  rifaximin (XIFAXAN) tablet 550 mg  Status:  Discontinued        550 mg Per Tube 2 times daily 06/11/22 1135 06/16/22 0803   06/08/22 1845  gentamicin (GARAMYCIN) 20 mg in dextrose  5 % 50 mL IVPB  Status:  Discontinued        20 mg 101 mL/hr over 30 Minutes Intravenous Every 12 hours 06/08/22 0753 06/12/22 1146   06/05/22 1845  gentamicin (GARAMYCIN) 40 mg in dextrose 5 % 50 mL IVPB  Status:  Discontinued        40 mg 102 mL/hr over 30 Minutes Intravenous Every 12 hours 06/05/22 0801 06/08/22 0753   06/02/22 1515  ceFAZolin (ANCEF) IVPB 2g/100 mL premix        2 g 200 mL/hr over 30 Minutes Intravenous Every 8 hours 06/02/22 1428 06/13/22 2154   06/01/22 1842  gentamicin (GARAMYCIN) IVPB 80 mg  Status:  Discontinued        80 mg 100 mL/hr over 30 Minutes Intravenous Every 12 hours 06/01/22 0800 06/05/22 0801   06/01/22 1205   vancomycin (VANCOCIN) IVPB 1000 mg/200 mL premix  Status:  Discontinued        1,000 mg 200 mL/hr over 60 Minutes Intravenous Every 12 hours 06/01/22 0839 06/02/22 1428   05/30/22 2300  vancomycin (VANCOREADY) IVPB 1250 mg/250 mL  Status:  Discontinued        1,250 mg 166.7 mL/hr over 90 Minutes Intravenous Every 24 hours 05/28/2022 2313 06/01/22 0839   05/30/22 2000  cefTRIAXone (ROCEPHIN) 2 g in sodium chloride 0.9 % 100 mL IVPB  Status:  Discontinued        2 g 200 mL/hr over 30 Minutes Intravenous Every 24 hours 05/30/22 1417 05/30/22 1443   05/30/22 2000  cefTRIAXone (ROCEPHIN) 2 g in sodium chloride 0.9 % 100 mL IVPB  Status:  Discontinued        2 g 200 mL/hr over 30 Minutes Intravenous Every 12 hours 05/30/22 1443 06/02/22 1428   05/30/22 1800  metroNIDAZOLE (FLAGYL) IVPB 500 mg  Status:  Discontinued        500 mg 100 mL/hr over 60 Minutes Intravenous Every 12 hours 05/30/22 1726 05/31/22 1155   05/30/22 1500  gentamicin (GARAMYCIN) 130 mg in dextrose 5 % 50 mL IVPB  Status:  Discontinued        5 mg/kg/day  77.1 kg 106.5 mL/hr over 30 Minutes Intravenous Every 8 hours 05/30/22 1359 06/01/22 0800   05/30/22 1330  ampicillin (OMNIPEN) 2 g in sodium chloride 0.9 % 100 mL IVPB  Status:  Discontinued        2 g 300 mL/hr over 20 Minutes Intravenous Every 4 hours 05/30/22 1238 06/16/22 0813   05/30/22 0500  ceFEPIme (MAXIPIME) 2 g in sodium chloride 0.9 % 100 mL IVPB  Status:  Discontinued        2 g 200 mL/hr over 30 Minutes Intravenous Every 8 hours 06/03/2022 2313 05/30/22 1417   06/10/2022 2000  vancomycin (VANCOREADY) IVPB 1750 mg/350 mL        1,750 mg 175 mL/hr over 120 Minutes Intravenous  Once 05/26/2022 1952 05/30/22 0128   06/19/2022 2000  ceFEPIme (MAXIPIME) 2 g in sodium chloride 0.9 % 100 mL IVPB  Status:  Discontinued        2 g 200 mL/hr over 30 Minutes Intravenous  Once 06/11/2022 1952 06/19/2022 2003   06/05/2022 1945  ceFEPIme (MAXIPIME) 2 g in sodium chloride 0.9 % 100 mL IVPB         2 g 200 mL/hr over 30 Minutes Intravenous  Once 06/09/2022 1938 06/04/2022 2126   06/04/2022 1945  metroNIDAZOLE (FLAGYL) IVPB 500 mg  500 mg 100 mL/hr over 60 Minutes Intravenous  Once 05/24/2022 1938 06/17/2022 2322   06/03/2022 1945  vancomycin (VANCOCIN) IVPB 1000 mg/200 mL premix  Status:  Discontinued        1,000 mg 200 mL/hr over 60 Minutes Intravenous  Once 06/14/2022 1938 06/01/2022 2003       Assessment/Plan: pSBO vs ileus -only has appy before and not entirely sure she has an sbo (I have reviewed imaging) -xray today shows dilated bowel dont see contrast passage but she does have liquid stool in flexiseal -no indication for any surgery -will repeat film again tomorrow -would hold tube feeds and leave npo for now also -rectal lactulose fine   Rolm Bookbinder 06/17/2022

## 2022-06-17 NOTE — Progress Notes (Signed)
Around 3601 pts monitor alarming for artifact. In to room to evaluate pt and noted pt to be having generalized tonic clonic seizures. [place] contacted visualized seizure activity.  Per MD orders following meds given: 6580: 2 mg Versed 0243: 2 mg Versed 0251: Versed drip started at 5mg  and 52md bolus given 0307: 5mg  Versed bolus given

## 2022-06-17 NOTE — Progress Notes (Signed)
EEG maint complete.  ?

## 2022-06-17 NOTE — Progress Notes (Signed)
Vent patient transported from MRI to 2M11 without any complications.

## 2022-06-17 NOTE — Progress Notes (Addendum)
PM rounds - bedside  LP in progress -> Dr Tacy Learn could not ge it. Needs IR guided LP 11/27-11/28 -> oprdred No major changes    SIGNATURE    Dr. Brand Males, M.D., F.C.C.P,  Pulmonary and Critical Care Medicine Staff Physician, [place] Director - Interstitial Lung Disease  Program  Medical Director - [place] [facility] [place] [facility] [place], [place], [address]   Pager: [phone], If no answer  -[place] or [place] [phone] Telephone (clinical office): [phone] Telephone (research): [phone]  6:14 PM 06/17/2022

## 2022-06-17 NOTE — Progress Notes (Signed)
eLink Physician-Brief Progress Note Patient Name: Deanna Mcmillan DOB: 01-31-1971 MRN: [id number]   Date of Service  06/17/2022  HPI/Events of Note  Notified that pt was seizing.  On camera evlauation, pt was having tonic clonic movements of all extremities.  She had not been on sedation due to hepatic encephalopathy and hyperammonemia.  No hypoglyemia noted.   eICU Interventions  Pt given pushes of midazolam 2-5mg   Started on midazolam drip. Will plan to load with keppra as well.  Will check BMP, Mg, phos, CBC, lactic acid ammonia. Will await results, correct abnormalities as warranted.  Neurology consult called.         [place] 06/17/2022, 3:10 AM

## 2022-06-17 NOTE — Progress Notes (Signed)
NAME:  YASEMIN RABON, MRN:  [id number], DOB:  1971/02/02, LOS: 61 ADMISSION DATE:  06/09/2022, CONSULTATION DATE:  11/7 REFERRING MD:  Lendon Colonel, EDP CHIEF COMPLAINT:  AMS   History of Present Illness:  51 yo woman with hx of recent diagnosis of alcoholic hepatitis (dx 33/82), here with ams, fever.   History from [name].  Patient was in her normal state of health, until suddenly the night PTA she developed a headache.  She went to sleep to rest.  The following day she slept throughout the day, that evening her mother went to wake her and was unable to wake her up, so she called 911.     She has been doing well since her recent hospital discharge, taking all the meds as prescribed, no new meds.  Planned follow up with GI on 11/14.  LE edema had been improving.  No cough, sob no focal symptoms.  No known sick contacts.     In ED, febrile, tachycardic, lethargic, desaturating to 70s.   Intubated by ED physician.    Had been admitted earlier in October and started on course of prenisone for alc hepatitis.    Recently seen at [facility] 10/19: LE Edema. Lasix not helpful at home.  Admitted for diuresis.  Jaundice and abd distension also noted.  Pleural effusion.  GI saw: non enough ascites for tap  Started lasix and spironolactone.  28 day course of prednisolone, miralax, lactulose, miralax. Midodrine TID    In ED got cefepime, LR 1L, flagyl ordered, intubated, started on Propofol, vanc ordered   Pertinent  Medical History  ETOH Cirrhosis BLE edema recently worsening  S/p lap appy 2014    Echo: recent normal 52/5397   Home meds: folic ascid, acetaminohpen, lasix 40bid, lactulose 30 TID protonix, prednisone (04/29/22), prednisolone 04/28/22 three week course, thiamine, simethicone,    Per her mother she had been drinking at least one bottle of wine daily, she isnt clear exactly how much she was drinking but she did also drink [place].  She notes she had wanted to stop recently but  been unable to.  She thinks she was depressed after experiencing perimenopause.  No tobacco.   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 11/18 more awake, following commands 1121 RUE PICC 11/22 extubated to BiPAP 11/23 - 14d of Rx complerted for MSSA/MESSE. For listeria - ID wants to extend amp/gent to 3 weeks 11/8-11/29. Ileus post extubation and NG to LIS . Bennett increased. BP sofft an dlevophed and alb Rx.   11/24 -  OG placed yesterday for ileus and bile returns. TF on hold. Lactulose increased yesterday. REamins off vent. Did not need BiPAP. This AM more obtunded but mom thinks was awake at night and is sleeping due to fatigue. Afebrile.  But wBC up 13L. Neg baklance -3L:  OFF PRESSORs = Ammondia up at 72 - [place] mild periphepatic ascites Blood culture  11/25 - Now intubated due to obtunded encephaopathy. CTAbc with SBO v Ileus with transition ponit . CCS consult - no surgery but ok for rectal lactulose w ith continue NG/OG tube to LIS. ID ordered repeat blood culture. Linezolid added.  No ntrition since 06/14/22 Pm. Creat worsening. LEss Ur OP. On levophed 23mcg. Not on sedation. Vent 40% Ammonia 108 Trach aspirate  Interim History / Subjective:   11/26-overnight seizures and currently on continuous EEG which shows severe encephalopathy.  On Keppra  Versed infusion. Not on fent gtt. Not  on diprivan  CT head negative.  MRI pending.  Remains on the ventilator 40% FiO2.  On dextrose infusion and antibiotic. Also on levophed gtt. K/Creat worse.  CCS ok srestarting lactulose/xifaxan and addding lokelma via enteral route. Ammonia 116 and worse   Objective   Blood pressure (!) 143/65, pulse (!) 110, temperature 99.1 F (37.3 C), resp. rate (!) 23, weight 64.5 kg, SpO2 96 %.    Vent Mode: PRVC FiO2 (%):  [40 %-100 %] 40 % Set Rate:  [20 bmp] 20 bmp Vt Set:  [430 mL] 430 mL PEEP:  [5 cmH20] 5 cmH20 Plateau Pressure:  [15 cmH20-16 cmH20] 15 cmH20   Intake/Output Summary (Last 24 hours) at 06/17/2022 1950 Last data filed at 06/17/2022 0900 Gross per 24 hour  Intake 4199.88 ml  Output 1325 ml  Net 2874.88 ml   Filed Weights   06/11/22 0500 06/16/22 0545 06/17/22 0400  Weight: 73.6 kg 64.5 kg 64.5 kg     General Appearance:  Looks criticall ill  JAUNDICED Head:  Normocephalic, without obvious abnormality, atraumatic Eyes:  PERRL - yes, conjunctiva/corneas - JAUNDICES     Ears:  Normal external ear canals, both ears Nose:  G tube - NG and cortrak Throat:  ETT TUBE - yes , OG tube - no Neck:  Supple,  No enlargement/tenderness/nodules Lungs: Clear to auscultation bilaterally, Ventilator   Synchrony - yes Heart:  S1 and S2 normal, no murmur, CVP - no.  Pressors - levophed Abdomen:  Soft, no masses, no organomegaly Genitalia / Rectal:  Not done Extremities:  Extremities- ingact with bruising Skin:  ntact in exposed areas . Sacral area - not examined Neurologic:  Sedation - versed gtt -> RASS - -4    Resolved Hospital Problem list   Ileus Hypokalemia Hypophosphatemia Hypernatremia Thrombocytopenia  Assessment & Plan:   Hypotension/Circulatory shock -recurred 06/14/22, and again 06/15/22   11/26 -  on low dose levophed  Plan  - map goal >= 85 since  06/16/22 (has AKI) due to some concen for hepato renal - restart midodrine (per CCS 11/26 ok for meds)  - s/p  albumin 11/23 and repeat 11/24 and 06/16/22,  - will do empiric albumin (for potential HRS) 1gm/kg x  2 days - 06/17/22  - holding lasix and aldactone   Encephalopathy -hepatic and metabolic (sepsis and  high Na) - got worse 06/15/22 New Seizure 06/17/22 - correlate with worsening ammonia level  11/26 -worsening encephalopathy with seizures all correlating with onset of ileus and worsening ammonia level  Plna  = Restart p.o. lactulose and Xifaxan 06/17/2022 [discussed with CCS] - continuet rectal lactulose 112/24/23  - NPO - await MRI - appreciate neuro consult   Keppra 11/26>>  C-EEG 11/26 - stop versed gt - start diprivan gtt - RASS goal o to -2   Alcoholic cirrhosis - Recently diagnosed cirrhosis during last hospitalization. MELD Na score of 17. Child Pugh Class C. FIB-4 score of 7.46  indicates advance fibrosis. Peritoneal labs admit  not consistent with SBP, however, patient received abx prior to tap.  11/25 - MELD 22 points and worse due to rising creat and bili  Plan - supportive care -Might need GI consult   ILEUS -r ecurrened 06/14/22 - CT 112/4 : Multiple dilated loops of small bowel with a transition zone in the mid to distal ileum consistent with a least partial small bowel obstruction. 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 PCT c/w localized infection  Plan - awaitt trach aspirate 06/16/22 - await blood culture 06/15/22  -Abx: 11/08-c ampicllin FOR LISTERIA >> (06/20/22) 11/08-c gentamicin >>06/15/22   11/11-11/22 cefazolin  11/08-11/11 ceftriaxone 11/07-08 vanc/cefepime/flagyl 11/23 - linezold for MSSA >> (06/20/22), (06/20/22) - cefazolin (MSSA)>>  -appreciate ID assistance - needs TEE at some pioint    Acute hypoxic  respiratory failure (CAP versus aspiration pneumonia and Compressive atelectasis )  - Extubated 11/22  - reintubated 06/15/22   06/17/2022 - > does not meet criteria for SBT/Extubation in setting of Acute Respiratory Failure due to shock, AKI, encpehalopathy  Plan  - Oral care - HOB > 30 - PRVC - No SBT   Hypernatremia  11/25 - resolved  Plan -  - change d5W from 30ml/h to kvo   AKI (Baseline 0.6- 0.9mg %) - onset 06/15/22   11/26 - WORSE. Risk factor: cirrhosis, recent gentamicin - ? HRS  Plna  - MAP goal >= 85 - start scheduled lookelma 08/17/21 - renal caonsult Dr Joylene Grapes called  Hx SVT Prolonged Qtc,  - 510msec 06/04/22  - 513 msec 06/17/22  Plan  -  monitor - avoid Qtc prolongation drus  Anemia - chronic (baseline 8gm-9gm% in oct 2023)- Present on Admit   11/26 - no active bleeding  Plan  - - PRBC for hgb </= 6.9gm%    - exceptions are   -  if ACS susepcted/confirmed then transfuse for hgb </= 8.0gm%,  or    -  active bleeding with hemodynamic instability, then transfuse regardless of hemoglobin value   At at all times try to transfuse 1 unit prbc as possible with exception of active hemorrhage   Protein caloric malnutrition, mild  11/26  TF still on hold  Plan  - hold off TPN - D5w for now kvo   Hyperglycemia, controlled -continue SSI prn -goal BG 140-180   SEvere p hysical deconditioning  Plan - She will need aggressive PT/OT (likely CIR) for strength training.   Best Practice (right click and "Reselect all SmartList Selections" daily)   Diet/type: tubefeeds - on hold since 06/14/22 due to ileus ->  DVT prophylaxis: prophylactic heparin  GI prophylaxis: PPI Lines: Central line - rUE PICC since 06/12/22 Foley:  Yes, and it is still needed Code Status:  full code Last date of multidisciplinary goals of care discussion [updated mother at bedside 11/22 and 06/14/21 and 06/15/22, 06/16/22 at bedside    Interdisciplinary Goals of  Care Family Meeting   Date carried out:: 06/17/2022  Location of the meeting: Bedside  Member's involved: Physician, Bedside Registered Nurse, Family Member or next of kin, and Other: Mom and RN [employer] Power of Tour manager: mom    Discussion: We discussed goals of  care for Burton Apley .  MODS  Code status: Full Code  Disposition: Continue current acute care - recommended No CPR . She is reflecting   Time spent for the meeting: 10 min      [place]   The patient BORA BOST is critically ill with multiple organ systems failure and requires high complexity decision making for assessment and support, frequent evaluation and titration of therapies, application of advanced monitoring technologies and extensive interpretation of multiple databases.   Critical Care Time devoted to patient care services described in this note is  40  Minutes. This time reflects time of care of this signee Dr Brand Males. This critical care time does not reflect procedure time, or teaching time or supervisory time of PA/NP/Med student/Med Resident etc but could involve care discussion time     Dr. Brand Males, M.D., [facility]C.P Pulmonary and Critical Care Medicine Medical Director - [facility] ICU Staff Physician, [place] [facility] Pager: [phone], If no answer or between  15:00h - 7:00h: call [phone]  06/17/2022 9:37 AM     LABS    PULMONARY Recent Labs  Lab 06/15/22 1037 06/15/22 2107 06/15/22 2238  PHART 7.557* 7.564* 7.502*  PCO2ART 35.2 38.2 43.8  PO2ART 67* 91 69*  HCO3 31.3* 34.7* 34.5*  TCO2 32 36* 36*  O2SAT 95 98 95    CBC Recent Labs  Lab 06/15/22 0403 06/15/22 1037 06/15/22 2238 06/16/22 0335 06/17/22 0253  HGB 10.5*   < > 10.9* 10.2* 9.8*  HCT 32.6*   < > 32.0* 32.7* 31.1*  WBC 13.1*  --   --  20.6* 23.5*  PLT 277  --   --  361 324    < > = values in this interval not displayed.    COAGULATION Recent Labs  Lab 06/16/22 0335  INR 1.4*    CARDIAC  No results for input(s): "TROPONINI" in the last 168 hours. No results for input(s): "PROBNP" in the last 168 hours.   CHEMISTRY Recent Labs  Lab 06/11/22 0336 06/12/22 0541 06/14/22 0306 06/15/22 0403 06/15/22 1037 06/15/22 2052 06/15/22 2107 06/15/22 2238 06/16/22 0335 06/16/22 0826 06/17/22 0253  NA 144   < > 151* 153*   < > 150* 149* 148* 148* 143 141  K 3.9   < > 3.3* 4.5   < > 4.6 4.6 4.9 4.7 3.8 5.3*  CL 105   < > 110 109  --  104  --   --  102 98 94*  CO2 27   < > 26 27  --  31  --   --  31 25 26   GLUCOSE 144*   < > 167* 170*  --  129*  --   --  144* 208* 129*  BUN 35*   < > 51* 57*  --  70*  --   --  76* 68* 74*  CREATININE 0.61   < > 0.59 0.89  --  1.88*  --   --  2.30* 2.24* 2.61*  CALCIUM 9.7   < > 9.4 9.9  --  9.3  --   --  9.1 7.8* 8.3*  MG 2.1  --  2.6* 2.6*  --  3.0*  --   --  2.7*  --  2.8*  PHOS 3.7  --   --  3.6  --   --   --   --  6.1*  --  8.1*   < > = values in this interval not displayed.   Estimated Creatinine Clearance: 21.8 mL/min (A) (by C-G formula based on SCr of 2.61 mg/dL (H)).   LIVER Recent Labs  Lab 06/13/22 0450 06/14/22 0306 06/15/22 0403 06/16/22 0335 06/17/22 0253  AST 72* 49* 57* 62* 60*  ALT 23 17 21 21 21   ALKPHOS 111 105 112 108 107  BILITOT 2.7* 2.2* 2.7* 2.8* 3.3*  PROT 6.5 6.8 7.2 7.1 7.4  ALBUMIN 2.6* 2.7* 3.0* 2.9* 3.8  INR  --   --   --  1.4*  --      INFECTIOUS Recent Labs  Lab 06/15/22 1054 06/16/22 0335 06/16/22 0826 06/17/22 0253 06/17/22 0514  LATICACIDVEN 1.7 2.1* 1.9 6.8* 1.8  PROCALCITON 0.55 1.63  --  0.78  --      ENDOCRINE CBG (last 3)  Recent Labs    06/16/22 2319 06/17/22 0239 06/17/22 0758  GLUCAP 194* 121* 170*         IMAGING x48h  - image(s) personally visualized  -   highlighted in bold DG Abd Portable 1V-Small Bowel Obstruction Protocol-24 hr  delay  Result Date: 06/17/2022 CLINICAL DATA:  Nasogastric tube placement. EXAM: PORTABLE ABDOMEN - 1 VIEW COMPARISON:  June 16, 2022. FINDINGS: Distal tip of nasogastric tube is seen in expected position of distal stomach. Distal tip of feeding tube is seen in expected position of proximal duodenum. Continued small bowel dilatation is noted. IMPRESSION: Stable position of nasogastric and feeding tubes. Grossly stable small bowel dilatation. Electronically Signed   By: Marijo Conception M.D.   On: 06/17/2022 08:33   Overnight EEG with video  Result Date: 06/17/2022 Lora Havens, MD     06/17/2022  7:46 AM Patient Name: LYLA JASEK MRN: [id number] Epilepsy Attending: Lora Havens Referring Physician/Provider: Donnetta Simpers, MD Duration: 06/17/2022 0513 to 06/17/2022 0745 Patient history: 51yo F with GTC seizure. EEG to evaluate for seizure. Level of alertness: comatose AEDs during EEG study: LEV, versed Technical aspects: This EEG study was done with scalp electrodes positioned according to the 10-20 International system of electrode placement. Electrical activity was reviewed with band pass filter of 1-70Hz , sensitivity of 7 uV/mm, display speed of 30mm/sec with a 60Hz  notched filter applied as appropriate. EEG data were recorded continuously and digitally stored.  Video monitoring was available and reviewed as appropriate. Description: EEG showed burst suppression pattern with burst of 5-7hz  theta slowing lasting 1 seconds alternating with 2-5 seconds of generalized suppression. Hyperventilation and photic stimulation were not performed.   ABNORMALITY - Burst suppression, generalized IMPRESSION: This study is suggestive of profound diffuse encephalopathy, nonspecific etiology but likely related to sedation. No seizures or epileptiform discharges were seen throughout the recording. Priyanka Barbra Sarks   CT HEAD WO CONTRAST (5MM)  Result Date: 06/17/2022 CLINICAL DATA:  Seizure, new  onset. Meningitis/CNS infection suspected. Mental status change, unknown cause. EXAM: CT HEAD WITHOUT CONTRAST TECHNIQUE: Contiguous axial images were obtained from the base of the skull through the vertex without intravenous contrast. RADIATION DOSE REDUCTION: This exam was performed according to the departmental dose-optimization program which includes automated exposure control, adjustment of the mA and/or kV according to patient size and/or use of iterative reconstruction technique. COMPARISON:  05/24/2022. FINDINGS: Brain: No acute intracranial hemorrhage, midline shift or mass effect. No extra-axial fluid collection. Mild atrophy is noted. Periventricular white matter hypodensities are present bilaterally. No hydrocephalus. Vascular: No hyperdense vessel or unexpected calcification.  Skull: Normal. Negative for fracture or focal lesion. Sinuses/Orbits: No acute finding. Other: Tubes are present in the nasopharynx. A few opacities are noted in the mastoid air cells on the right. IMPRESSION: 1. No acute intracranial hemorrhage. 2. Atrophy with chronic microvascular ischemic changes. Electronically Signed   By: Brett Fairy M.D.   On: 06/17/2022 04:55   DG Abd 1 View  Result Date: 06/16/2022 CLINICAL DATA:  Evaluate NG tube placement EXAM: ABDOMEN - 1 VIEW COMPARISON:  June 16, 2022 FINDINGS: The NG tube terminates in the distal stomach. The feeding tube terminates in the distal second portion of the duodenum. Continued small bowel obstruction with dilated loops of small bowel. IMPRESSION: 1. The NG tube terminates in the distal stomach. The feeding tube terminates in the distal second portion of the duodenum. 2. Continued small bowel obstruction. Electronically Signed   By: Dorise Bullion III M.D.   On: 06/16/2022 12:57   DG Abd Portable 1V-Small Bowel Obstruction Protocol-initial, 8 hr delay  Result Date: 06/16/2022 CLINICAL DATA:  Small bowel obstruction. EXAM: PORTABLE ABDOMEN - 1 VIEW  COMPARISON:  KUB June 15, 2022. CT the abdomen and pelvis June 15, 2022. FINDINGS: The feeding tube is been advanced and appears to terminate in the distal second portion of the duodenum. The NG tube terminates in the stomach. Dilated loops of small bowel are identified throughout the abdomen consistent with on going small-bowel obstruction. IMPRESSION: 1. Support apparatus as above. 2. Continued small bowel obstruction. Electronically Signed   By: Dorise Bullion III M.D.   On: 06/16/2022 11:52   CT ABDOMEN PELVIS WO CONTRAST  Result Date: 06/15/2022 CLINICAL DATA:  Dilated bowel loops on recent plain film EXAM: CT ABDOMEN AND PELVIS WITHOUT CONTRAST TECHNIQUE: Multidetector CT imaging of the abdomen and pelvis was performed following the standard protocol without IV contrast. RADIATION DOSE REDUCTION: This exam was performed according to the departmental dose-optimization program which includes automated exposure control, adjustment of the mA and/or kV according to patient size and/or use of iterative reconstruction technique. COMPARISON:  06/12/2022 FINDINGS: Lower chest: Bilateral lower lobe consolidation is noted with moderate left-sided pleural effusion. This is stable from the prior exam. Hepatobiliary: Small hypodensity is noted within the liver near the dome of the liver stable from the prior exam likely representing a small cyst. Mild nodularity of the liver is noted consistent with underlying cirrhosis. Gallbladder is within normal limits. Dependent density is noted within the gallbladder likely representing sludge. No definitive gallstones are seen. Mild perihepatic fluid is seen. Pancreas: Unremarkable. No pancreatic ductal dilatation or surrounding inflammatory changes. Spleen: Normal in size without focal abnormality. Adrenals/Urinary Tract: Adrenal glands are within normal limits. Kidneys are unremarkable. No renal calculi or obstructive changes are noted. The bladder is partially  distended. Stomach/Bowel: Rectal tube is noted in place. The colon shows mild diverticular change without obstructive process. Air is noted throughout the transverse colon. The appendix is not visualized consistent with a prior surgical history. Stomach is distended with fluid despite gastric catheters. The weighted feeding catheter extends towards the pylorus. Multiple dilated loops of small bowel are noted extending from the duodenum to the mid to distal ileum. Fecalization of small bowel contents is noted. A transition zone is seen in the anterior abdomen best noted on image number 57 of series 3. Changes may be related to adhesions. The distal most small bowel is within normal limits. Vascular/Lymphatic: No significant vascular findings are present. No enlarged abdominal or pelvic lymph nodes. Reproductive: Uterus  and bilateral adnexa are unremarkable. Other: Mild free fluid is noted within the abdomen likely related to the underlying cirrhosis although some may be reactive to the small bowel abnormality. No free air is seen. Musculoskeletal: Degenerative changes of lumbar spine are noted. IMPRESSION: Multiple dilated loops of small bowel with a transition zone in the mid to distal ileum consistent with a least partial small bowel obstruction. Bibasilar consolidation with left-sided pleural effusions stable from the prior CT. Cirrhotic changes of the liver with ascites. Ascites may also be in part due to the underlying obstructive change. Electronically Signed   By: Inez Catalina M.D.   On: 06/15/2022 23:30   DG CHEST PORT 1 VIEW  Result Date: 06/15/2022 CLINICAL DATA:  Endotracheal tube and feeding tube placement. EXAM: PORTABLE CHEST 1 VIEW COMPARISON:  None Available. FINDINGS: Endotracheal tube with distal tip in the right mainstem bronchus, it need to be retracted 4-5 cm. Feeding tube coursing below the diaphragm with distal tip not included. Heart is normal in size. Low lung volumes with bibasilar  opacities suggesting atelectasis or infiltrate. IMPRESSION: 1. Endotracheal tube with distal tip in the right mainstem bronchus, it need to be retracted 3-4 cm. 2. Low lung volumes with bibasilar opacities suggesting atelectasis or infiltrate. Electronically Signed   By: Keane Police D.O.   On: 06/15/2022 22:13   DG Abd Portable 1V  Result Date: 06/15/2022 CLINICAL DATA:  Check feeding catheter placement EXAM: PORTABLE ABDOMEN - 1 VIEW COMPARISON:  None Available. FINDINGS: Gastric catheter is noted within the stomach. Weighted feeding catheter is noted in the distal stomach near the pyloric channel. Multiple dilated loops of small bowel are noted suspicious for small bowel obstruction. No free air is noted. IMPRESSION: Gastric catheters as described. Electronically Signed   By: Inez Catalina M.D.   On: 06/15/2022 22:12   DG Abd 1 View  Result Date: 06/15/2022 CLINICAL DATA:  Ileus. EXAM: ABDOMEN - 1 VIEW COMPARISON:  June 14, 2022. FINDINGS: Distal tip of nasogastric tube is seen in expected position of stomach. Distal tip of feeding tube is seen in expected position of distal stomach. Dilated small bowel loops are noted concerning for ileus or possibly distal small bowel obstruction. IMPRESSION: Stable dilated small bowel loops are noted concerning for ileus or possibly distal small bowel obstruction. Electronically Signed   By: Marijo Conception M.D.   On: 06/15/2022 10:22

## 2022-06-18 ENCOUNTER — Inpatient Hospital Stay (HOSPITAL_COMMUNITY): Payer: 59

## 2022-06-18 DIAGNOSIS — R569 Unspecified convulsions: Secondary | ICD-10-CM | POA: Diagnosis not present

## 2022-06-18 DIAGNOSIS — A419 Sepsis, unspecified organism: Secondary | ICD-10-CM

## 2022-06-18 DIAGNOSIS — K56609 Unspecified intestinal obstruction, unspecified as to partial versus complete obstruction: Secondary | ICD-10-CM

## 2022-06-18 DIAGNOSIS — J9601 Acute respiratory failure with hypoxia: Secondary | ICD-10-CM | POA: Diagnosis not present

## 2022-06-18 LAB — COMPREHENSIVE METABOLIC PANEL
ALT: 20 U/L (ref 0–44)
AST: 51 U/L — ABNORMAL HIGH (ref 15–41)
Albumin: 3.3 g/dL — ABNORMAL LOW (ref 3.5–5.0)
Alkaline Phosphatase: 78 U/L (ref 38–126)
Anion gap: 19 — ABNORMAL HIGH (ref 5–15)
BUN: 71 mg/dL — ABNORMAL HIGH (ref 6–20)
CO2: 24 mmol/L (ref 22–32)
Calcium: 7.4 mg/dL — ABNORMAL LOW (ref 8.9–10.3)
Chloride: 94 mmol/L — ABNORMAL LOW (ref 98–111)
Creatinine, Ser: 2.19 mg/dL — ABNORMAL HIGH (ref 0.44–1.00)
GFR, Estimated: 26 mL/min — ABNORMAL LOW (ref >60)
Glucose, Bld: 144 mg/dL — ABNORMAL HIGH (ref 70–99)
Potassium: 2.7 mmol/L — CL (ref 3.5–5.1)
Sodium: 137 mmol/L (ref 135–145)
Total Bilirubin: 3.4 mg/dL — ABNORMAL HIGH (ref 0.3–1.2)
Total Protein: 6.5 g/dL (ref 6.5–8.1)

## 2022-06-18 LAB — CBC
HCT: 26.7 % — ABNORMAL LOW (ref 36.0–46.0)
Hemoglobin: 8.7 g/dL — ABNORMAL LOW (ref 12.0–15.0)
MCH: 36.7 pg — ABNORMAL HIGH (ref 26.0–34.0)
MCHC: 32.6 g/dL (ref 30.0–36.0)
MCV: 112.7 fL — ABNORMAL HIGH (ref 80.0–100.0)
Platelets: 255 10*3/uL (ref 150–400)
RBC: 2.37 MIL/uL — ABNORMAL LOW (ref 3.87–5.11)
RDW: 16.2 % — ABNORMAL HIGH (ref 11.5–15.5)
WBC: 16.5 10*3/uL — ABNORMAL HIGH (ref 4.0–10.5)
nRBC: 0.1 % (ref 0.0–0.2)

## 2022-06-18 LAB — GLUCOSE, CAPILLARY
Glucose-Capillary: 131 mg/dL — ABNORMAL HIGH (ref 70–99)
Glucose-Capillary: 141 mg/dL — ABNORMAL HIGH (ref 70–99)
Glucose-Capillary: 141 mg/dL — ABNORMAL HIGH (ref 70–99)
Glucose-Capillary: 149 mg/dL — ABNORMAL HIGH (ref 70–99)
Glucose-Capillary: 153 mg/dL — ABNORMAL HIGH (ref 70–99)
Glucose-Capillary: 193 mg/dL — ABNORMAL HIGH (ref 70–99)

## 2022-06-18 LAB — CSF CELL COUNT WITH DIFFERENTIAL
Eosinophils, CSF: 0 % (ref 0–1)
Lymphs, CSF: 92 % — ABNORMAL HIGH (ref 40–80)
Monocyte-Macrophage-Spinal Fluid: 5 % — ABNORMAL LOW (ref 15–45)
RBC Count, CSF: 5000 /mm3 — ABNORMAL HIGH
Segmented Neutrophils-CSF: 3 % (ref 0–6)
Tube #: 3
WBC, CSF: 191 /mm3 (ref 0–5)

## 2022-06-18 LAB — MENINGITIS/ENCEPHALITIS PANEL (CSF)

## 2022-06-18 LAB — BASIC METABOLIC PANEL
Anion gap: 20 — ABNORMAL HIGH (ref 5–15)
BUN: 67 mg/dL — ABNORMAL HIGH (ref 6–20)
CO2: 24 mmol/L (ref 22–32)
Calcium: 7.4 mg/dL — ABNORMAL LOW (ref 8.9–10.3)
Chloride: 96 mmol/L — ABNORMAL LOW (ref 98–111)
Creatinine, Ser: 2.08 mg/dL — ABNORMAL HIGH (ref 0.44–1.00)
GFR, Estimated: 28 mL/min — ABNORMAL LOW (ref >60)
Glucose, Bld: 135 mg/dL — ABNORMAL HIGH (ref 70–99)
Potassium: 2.5 mmol/L — CL (ref 3.5–5.1)
Sodium: 140 mmol/L (ref 135–145)

## 2022-06-18 LAB — POTASSIUM
Potassium: 2.6 mmol/L — CL (ref 3.5–5.1)
Potassium: 3.1 mmol/L — ABNORMAL LOW (ref 3.5–5.1)

## 2022-06-18 LAB — PROTEIN AND GLUCOSE, CSF
Glucose, CSF: 94 mg/dL — ABNORMAL HIGH (ref 40–70)
Total  Protein, CSF: >600 mg/dL — ABNORMAL HIGH (ref 15–45)

## 2022-06-18 LAB — TRIGLYCERIDES: Triglycerides: 288 mg/dL — ABNORMAL HIGH (ref <150)

## 2022-06-18 MED ORDER — ALBUMIN HUMAN 25 % IV SOLN
12.5000 g | Freq: Four times a day (QID) | INTRAVENOUS | Status: AC
  Administered 2022-06-18 (×2): 12.5 g via INTRAVENOUS
  Filled 2022-06-18 (×2): qty 50

## 2022-06-18 MED ORDER — POTASSIUM CHLORIDE 20 MEQ PO PACK
40.0000 meq | PACK | Freq: Once | ORAL | Status: AC
  Administered 2022-06-18: 40 meq
  Filled 2022-06-18: qty 2

## 2022-06-18 MED ORDER — POTASSIUM CHLORIDE 10 MEQ/100ML IV SOLN: 10.0000 meq | INTRAVENOUS | Status: DC

## 2022-06-18 MED ORDER — POTASSIUM CHLORIDE 20 MEQ PO PACK: 20.0000 meq | PACK | Freq: Once | ORAL | Status: DC

## 2022-06-18 MED ORDER — POTASSIUM CHLORIDE 10 MEQ/50ML IV SOLN: 10.0000 meq | INTRAVENOUS | Status: DC

## 2022-06-18 MED ORDER — LACTULOSE ENEMA
300.0000 mL | Freq: Every day | ORAL | Status: DC
  Administered 2022-06-18 – 2022-06-20 (×3): 300 mL via RECTAL
  Filled 2022-06-18 (×4): qty 300

## 2022-06-18 MED ORDER — SODIUM CHLORIDE 0.9 % IV SOLN
1.0000 g | Freq: Two times a day (BID) | INTRAVENOUS | Status: DC
  Administered 2022-06-18: 1 g via INTRAVENOUS
  Filled 2022-06-18: qty 20

## 2022-06-18 MED ORDER — LIDOCAINE HCL (PF) 1 % IJ SOLN
2.0000 mL | Freq: Once | INTRAMUSCULAR | Status: AC
  Administered 2022-06-18: 2 mL via INTRADERMAL

## 2022-06-18 MED ORDER — ACETAMINOPHEN 325 MG PO TABS: 650.0000 mg | ORAL_TABLET | Freq: Four times a day (QID) | ORAL | Status: DC | PRN

## 2022-06-18 MED ORDER — POTASSIUM CHLORIDE 10 MEQ/50ML IV SOLN
10.0000 meq | INTRAVENOUS | Status: AC
  Administered 2022-06-18 (×3): 10 meq via INTRAVENOUS
  Filled 2022-06-18 (×3): qty 50

## 2022-06-18 MED ORDER — LEVETIRACETAM IN NACL 500 MG/100ML IV SOLN
500.0000 mg | Freq: Two times a day (BID) | INTRAVENOUS | Status: DC
  Administered 2022-06-18 – 2022-06-21 (×6): 500 mg via INTRAVENOUS
  Filled 2022-06-18 (×6): qty 100

## 2022-06-18 MED ORDER — ACETAMINOPHEN 325 MG PO TABS
650.0000 mg | ORAL_TABLET | Freq: Once | ORAL | Status: AC
  Administered 2022-06-18: 650 mg via NASOGASTRIC
  Filled 2022-06-18: qty 2

## 2022-06-18 NOTE — Progress Notes (Signed)
Subjective/Chief Complaint: intubated, sedated, has output in flexiseal and output from NGT as well.  X-ray today noted   Objective: Vital signs in last 24 hours: Temp:  [99 F (37.2 C)-101.4 F (38.6 C)] 99.9 F (37.7 C) (11/27 0733) Pulse Rate:  [106-123] 106 (11/27 0815) Resp:  [20-26] 22 (11/27 0815) BP: (95-148)/(42-122) 120/49 (11/27 0815) SpO2:  [96 %-98 %] 97 % (11/27 0815) FiO2 (%):  [40 %] 40 % (11/27 0400) Weight:  [63.7 kg] 63.7 kg (11/27 0355) Last BM Date : 06/18/22  Intake/Output from previous day: 11/26 0701 - 11/27 0700 In: 3486.5 [I.V.:1115.5; IV Piggyback:1371] Out: 3900 [Urine:700; Emesis/NG output:1300; Stool:1900] Intake/Output this shift: Total I/O In: 55 [I.V.:25; NG/GT:30] Out: 200 [Urine:200]  Abdomen: soft, seems only slightly distended.  NGT with 1300cc yesterday, but 1900cc from flexi-seal.    Lab Results:  Recent Labs    06/16/22 0335 06/17/22 0253  WBC 20.6* 23.5*  HGB 10.2* 9.8*  HCT 32.7* 31.1*  PLT 361 324   BMET Recent Labs    06/18/22 0338 06/18/22 0527  NA 137 140  K 2.7* 2.5*  CL 94* 96*  CO2 24 24  GLUCOSE 144* 135*  BUN 71* 67*  CREATININE 2.19* 2.08*  CALCIUM 7.4* 7.4*   PT/INR Recent Labs    06/16/22 0335  LABPROT 17.4*  INR 1.4*   ABG Recent Labs    06/15/22 2107 06/15/22 2238  PHART 7.564* 7.502*  HCO3 34.7* 34.5*    Studies/Results: DG Abd Portable 1V  Result Date: 06/18/2022 CLINICAL DATA:  Small-bowel obstruction EXAM: PORTABLE ABDOMEN - 1 VIEW COMPARISON:  06/17/2022 FINDINGS: Nasogastric tube tip in the antrum of the stomach. Soft feeding tube tip is now in the region of the pylorus, previously in the descending duodenum. Persistent small bowel obstruction pattern, probably slightly worsened radiographically. Small bowel dilatation measures up to 4.6 cm. IMPRESSION: 1. Nasogastric tube tip in the antrum of the stomach. Soft feeding tube tip now in the region of the pylorus, previously in  the descending duodenum. 2. Persistent small bowel obstruction pattern, probably slightly worsened. Electronically Signed   By: Nelson Chimes M.D.   On: 06/18/2022 08:02   MR BRAIN WO CONTRAST  Result Date: 06/17/2022 CLINICAL DATA:  Seizure EXAM: MRI HEAD WITHOUT CONTRAST TECHNIQUE: Multiplanar, multiecho pulse sequences of the brain and surrounding structures were obtained without intravenous contrast. COMPARISON:  CT head obtained the same day and CT head 05/24/2022 FINDINGS: Brain: There is no acute intracranial hemorrhage, extra-axial fluid collection, or acute infarct. Areas of sulcal FLAIR hyperintensity may be related to intubation/oxygenation given normal same-day head CT. Parenchymal volume is normal. The ventricles are normal in size. Gray-white differentiation is preserved. There is mild FLAIR signal abnormality in the subcortical and periventricular white matter which is nonspecific but may reflect sequela of mild chronic small-vessel ischemic change. The hippocampi are normal in signal and architecture. There is no mass lesion.  There is no mass effect or midline shift. Vascular: Normal flow voids. Skull and upper cervical spine: Normal marrow signal. Sinuses/Orbits: The paranasal sinuses are clear. The globes and orbits are unremarkable. Other: There are bilateral mastoid effusions which may be related to instrumentation. IMPRESSION: No acute intracranial pathology or epileptogenic focus identified. Electronically Signed   By: Valetta Mole M.D.   On: 06/17/2022 17:57   DG Abd Portable 1V-Small Bowel Obstruction Protocol-24 hr delay  Result Date: 06/17/2022 CLINICAL DATA:  Nasogastric tube placement. EXAM: PORTABLE ABDOMEN - 1 VIEW COMPARISON:  June 16, 2022. FINDINGS: Distal tip of nasogastric tube is seen in expected position of distal stomach. Distal tip of feeding tube is seen in expected position of proximal duodenum. Continued small bowel dilatation is noted. IMPRESSION: Stable  position of nasogastric and feeding tubes. Grossly stable small bowel dilatation. Electronically Signed   By: Marijo Conception M.D.   On: 06/17/2022 08:33   Overnight EEG with video  Result Date: 06/17/2022 Lora Havens, MD     06/17/2022  7:46 AM Patient Name: Deanna Mcmillan MRN: [id number] Epilepsy Attending: Lora Havens Referring Physician/Provider: Donnetta Simpers, MD Duration: 06/17/2022 0513 to 06/17/2022 0745 Patient history: 51yo F with GTC seizure. EEG to evaluate for seizure. Level of alertness: comatose AEDs during EEG study: LEV, versed Technical aspects: This EEG study was done with scalp electrodes positioned according to the 10-20 International system of electrode placement. Electrical activity was reviewed with band pass filter of 1-70Hz , sensitivity of 7 uV/mm, display speed of 30mm/sec with a 60Hz  notched filter applied as appropriate. EEG data were recorded continuously and digitally stored.  Video monitoring was available and reviewed as appropriate. Description: EEG showed burst suppression pattern with burst of 5-7hz  theta slowing lasting 1 seconds alternating with 2-5 seconds of generalized suppression. Hyperventilation and photic stimulation were not performed.   ABNORMALITY - Burst suppression, generalized IMPRESSION: This study is suggestive of profound diffuse encephalopathy, nonspecific etiology but likely related to sedation. No seizures or epileptiform discharges were seen throughout the recording. Priyanka Barbra Sarks   CT HEAD WO CONTRAST (5MM)  Result Date: 06/17/2022 CLINICAL DATA:  Seizure, new onset. Meningitis/CNS infection suspected. Mental status change, unknown cause. EXAM: CT HEAD WITHOUT CONTRAST TECHNIQUE: Contiguous axial images were obtained from the base of the skull through the vertex without intravenous contrast. RADIATION DOSE REDUCTION: This exam was performed according to the departmental dose-optimization program which includes automated exposure  control, adjustment of the mA and/or kV according to patient size and/or use of iterative reconstruction technique. COMPARISON:  06/18/2022. FINDINGS: Brain: No acute intracranial hemorrhage, midline shift or mass effect. No extra-axial fluid collection. Mild atrophy is noted. Periventricular white matter hypodensities are present bilaterally. No hydrocephalus. Vascular: No hyperdense vessel or unexpected calcification. Skull: Normal. Negative for fracture or focal lesion. Sinuses/Orbits: No acute finding. Other: Tubes are present in the nasopharynx. A few opacities are noted in the mastoid air cells on the right. IMPRESSION: 1. No acute intracranial hemorrhage. 2. Atrophy with chronic microvascular ischemic changes. Electronically Signed   By: Brett Fairy M.D.   On: 06/17/2022 04:55   DG Abd 1 View  Result Date: 06/16/2022 CLINICAL DATA:  Evaluate NG tube placement EXAM: ABDOMEN - 1 VIEW COMPARISON:  June 16, 2022 FINDINGS: The NG tube terminates in the distal stomach. The feeding tube terminates in the distal second portion of the duodenum. Continued small bowel obstruction with dilated loops of small bowel. IMPRESSION: 1. The NG tube terminates in the distal stomach. The feeding tube terminates in the distal second portion of the duodenum. 2. Continued small bowel obstruction. Electronically Signed   By: Dorise Bullion III M.D.   On: 06/16/2022 12:57   DG Abd Portable 1V-Small Bowel Obstruction Protocol-initial, 8 hr delay  Result Date: 06/16/2022 CLINICAL DATA:  Small bowel obstruction. EXAM: PORTABLE ABDOMEN - 1 VIEW COMPARISON:  KUB June 15, 2022. CT the abdomen and pelvis June 15, 2022. FINDINGS: The feeding tube is been advanced and appears to terminate in the distal second portion of the duodenum.  The NG tube terminates in the stomach. Dilated loops of small bowel are identified throughout the abdomen consistent with on going small-bowel obstruction. IMPRESSION: 1. Support apparatus  as above. 2. Continued small bowel obstruction. Electronically Signed   By: Dorise Bullion III M.D.   On: 06/16/2022 11:52    Anti-infectives: Anti-infectives (From admission, onward)    Start     Dose/Rate Route Frequency Ordered Stop   06/20/22 0600  ceFAZolin (ANCEF) IVPB 2g/100 mL premix        2 g 200 mL/hr over 30 Minutes Intravenous Every 8 hours 06/15/22 1343     06/17/22 1400  ampicillin (OMNIPEN) 2 g in sodium chloride 0.9 % 100 mL IVPB        2 g 300 mL/hr over 20 Minutes Intravenous Every 8 hours 06/17/22 0731 06/20/22 1359   06/17/22 1100  rifaximin (XIFAXAN) tablet 550 mg        550 mg Per Tube 2 times daily 06/17/22 1000     06/16/22 1200  ampicillin (OMNIPEN) 2 g in sodium chloride 0.9 % 100 mL IVPB  Status:  Discontinued        2 g 300 mL/hr over 20 Minutes Intravenous Every 6 hours 06/16/22 0813 06/17/22 0731   06/15/22 1430  linezolid (ZYVOX) IVPB 600 mg        600 mg 300 mL/hr over 60 Minutes Intravenous Every 12 hours 06/15/22 1339 06/20/22 0959   06/12/22 2000  gentamicin (GARAMYCIN) IVPB 60 mg        60 mg 100 mL/hr over 30 Minutes Intravenous Every 24 hours 06/12/22 1146 06/15/22 2209   06/11/22 1230  rifaximin (XIFAXAN) tablet 550 mg  Status:  Discontinued        550 mg Per Tube 2 times daily 06/11/22 1135 06/16/22 0803   06/08/22 1845  gentamicin (GARAMYCIN) 20 mg in dextrose 5 % 50 mL IVPB  Status:  Discontinued        20 mg 101 mL/hr over 30 Minutes Intravenous Every 12 hours 06/08/22 0753 06/12/22 1146   06/05/22 1845  gentamicin (GARAMYCIN) 40 mg in dextrose 5 % 50 mL IVPB  Status:  Discontinued        40 mg 102 mL/hr over 30 Minutes Intravenous Every 12 hours 06/05/22 0801 06/08/22 0753   06/02/22 1515  ceFAZolin (ANCEF) IVPB 2g/100 mL premix        2 g 200 mL/hr over 30 Minutes Intravenous Every 8 hours 06/02/22 1428 06/13/22 2154   06/01/22 1842  gentamicin (GARAMYCIN) IVPB 80 mg  Status:  Discontinued        80 mg 100 mL/hr over 30 Minutes  Intravenous Every 12 hours 06/01/22 0800 06/05/22 0801   06/01/22 1205  vancomycin (VANCOCIN) IVPB 1000 mg/200 mL premix  Status:  Discontinued        1,000 mg 200 mL/hr over 60 Minutes Intravenous Every 12 hours 06/01/22 0839 06/02/22 1428   05/30/22 2300  vancomycin (VANCOREADY) IVPB 1250 mg/250 mL  Status:  Discontinued        1,250 mg 166.7 mL/hr over 90 Minutes Intravenous Every 24 hours 06/20/2022 2313 06/01/22 0839   05/30/22 2000  cefTRIAXone (ROCEPHIN) 2 g in sodium chloride 0.9 % 100 mL IVPB  Status:  Discontinued        2 g 200 mL/hr over 30 Minutes Intravenous Every 24 hours 05/30/22 1417 05/30/22 1443   05/30/22 2000  cefTRIAXone (ROCEPHIN) 2 g in sodium chloride 0.9 % 100 mL IVPB  Status:  Discontinued        2 g 200 mL/hr over 30 Minutes Intravenous Every 12 hours 05/30/22 1443 06/02/22 1428   05/30/22 1800  metroNIDAZOLE (FLAGYL) IVPB 500 mg  Status:  Discontinued        500 mg 100 mL/hr over 60 Minutes Intravenous Every 12 hours 05/30/22 1726 05/31/22 1155   05/30/22 1500  gentamicin (GARAMYCIN) 130 mg in dextrose 5 % 50 mL IVPB  Status:  Discontinued        5 mg/kg/day  77.1 kg 106.5 mL/hr over 30 Minutes Intravenous Every 8 hours 05/30/22 1359 06/01/22 0800   05/30/22 1330  ampicillin (OMNIPEN) 2 g in sodium chloride 0.9 % 100 mL IVPB  Status:  Discontinued        2 g 300 mL/hr over 20 Minutes Intravenous Every 4 hours 05/30/22 1238 06/16/22 0813   05/30/22 0500  ceFEPIme (MAXIPIME) 2 g in sodium chloride 0.9 % 100 mL IVPB  Status:  Discontinued        2 g 200 mL/hr over 30 Minutes Intravenous Every 8 hours 06/08/2022 2313 05/30/22 1417   06/17/2022 2000  vancomycin (VANCOREADY) IVPB 1750 mg/350 mL        1,750 mg 175 mL/hr over 120 Minutes Intravenous  Once 06/16/2022 1952 05/30/22 0128   05/25/2022 2000  ceFEPIme (MAXIPIME) 2 g in sodium chloride 0.9 % 100 mL IVPB  Status:  Discontinued        2 g 200 mL/hr over 30 Minutes Intravenous  Once 05/26/2022 1952 06/18/2022 2003    06/11/2022 1945  ceFEPIme (MAXIPIME) 2 g in sodium chloride 0.9 % 100 mL IVPB        2 g 200 mL/hr over 30 Minutes Intravenous  Once 05/28/2022 1938 06/06/2022 2126   06/18/2022 1945  metroNIDAZOLE (FLAGYL) IVPB 500 mg        500 mg 100 mL/hr over 60 Minutes Intravenous  Once 06/03/2022 1938 06/13/2022 2322   06/13/2022 1945  vancomycin (VANCOCIN) IVPB 1000 mg/200 mL premix  Status:  Discontinued        1,000 mg 200 mL/hr over 60 Minutes Intravenous  Once 06/17/2022 1938 05/28/2022 2003       Assessment/Plan: pSBO vs ileus -only has appy before and not entirely sure she has an sbo (I have reviewed imaging) -xray today shows dilated bowel dont see contrast passage but she does have liquid stool in flexiseal -no indication for any surgery -repeat films persist with small bowel dilatation, despite high flexi-seal output. -would hold tube feeds and leave npo for today.  If output from NGT goes down today then I think we can consider TFs tomorrow. -rectal lactulose fine  FEN - NPO/NGT/IVFs VTE - heparin ID - ancef, ampicillin, zyvox  Henreitta Cea 06/18/2022

## 2022-06-18 NOTE — Procedures (Signed)
Patient Name: Deanna Mcmillan  MRN: [id number]  Epilepsy Attending: Lora Havens  Referring Physician/Provider: Donnetta Simpers, MD  Duration: 06/18/2022 8102 to 06/19/2022 5486   Patient history: 51yo F with GTC seizure. EEG to evaluate for seizure.   Level of alertness: comatose   AEDs during EEG study: LEV, Propofol   Technical aspects: This EEG study was done with scalp electrodes positioned according to the 10-20 International system of electrode placement. Electrical activity was reviewed with band pass filter of 1-70Hz , sensitivity of 7 uV/mm, display speed of 59mm/sec with a 60Hz  notched filter applied as appropriate. EEG data were recorded continuously and digitally stored.  Video monitoring was available and reviewed as appropriate.   Description: EEG showed continuous 3-6 hz theta-delta slowing admixed with 1 to 2 seconds of generalized EEG attenuation at times. Hyperventilation and photic stimulation were not performed.      Of note, study was technically difficult to interpret due to significant electrode artifact.   ABNORMALITY -Continuous slow, generalized   IMPRESSION: This technically difficult study is suggestive of severe diffuse encephalopathy, nonspecific etiology but likely related to sedation. No seizures or epileptiform discharges were seen throughout the recording.   Sohrab Keelan Barbra Sarks

## 2022-06-18 NOTE — Progress Notes (Signed)
Neurology Progress Note  Major interval events/Subjective: - Remains minimally responsive    Current Facility-Administered Medications:    0.9 %  sodium chloride infusion, , Intravenous, PRN, Julian Hy, DO, Stopped at 06/14/22 0834   albumin human 25 % solution 12.5 g, 12.5 g, Intravenous, Q6H, Allyson Sabal, Sagar, MD   ampicillin (OMNIPEN) 2 g in sodium chloride 0.9 % 100 mL IVPB, 2 g, Intravenous, Q8H, Henri Medal, RPH, Stopped at 06/18/22 0533   [START ON 06/20/2022] ceFAZolin (ANCEF) IVPB 2g/100 mL premix, 2 g, Intravenous, Q8H, Vu, Johnny Bridge T, MD   Chlorhexidine Gluconate Cloth 2 % PADS 6 each, 6 each, Topical, Daily, Candee Furbish, MD, 6 each at 06/17/22 1045   dextrose 5 % solution, , Intravenous, Continuous, Brand Males, MD, Stopped at 06/17/22 1629   fentaNYL (SUBLIMAZE) injection 50-200 mcg, 50-200 mcg, Intravenous, Q30 min PRN, Chesley Mires, MD, 50 mcg at 97/35/32 9924   folic acid injection 1 mg, 1 mg, Intravenous, Daily, Halford Chessman, Vineet, MD, 1 mg at 06/17/22 0920   heparin injection 5,000 Units, 5,000 Units, Subcutaneous, Q8H, Corey Harold, NP, 5,000 Units at 06/18/22 0514   insulin aspart (novoLOG) injection 0-15 Units, 0-15 Units, Subcutaneous, Q4H, Ogan, [name], MD, 2 Units at 06/18/22 0743   lactulose (CHRONULAC) enema 200 gm, 300 mL, Rectal, Daily, Virl Axe, MD   leptospermum manuka honey (MEDIHONEY) paste 1 Application, 1 Application, Topical, Daily, Brand Males, MD, 1 Application at 26/83/41 0937   levETIRAcetam (KEPPRA) IVPB 1000 mg/100 mL premix, 1,000 mg, Intravenous, BID, Donnetta Simpers, MD, Stopped at 06/17/22 2143   linezolid (ZYVOX) IVPB 600 mg, 600 mg, Intravenous, Q12H, Brand Males, MD, Stopped at 06/17/22 2323   lip balm (CARMEX) ointment, , Topical, PRN, Virl Axe, MD   norepinephrine (LEVOPHED) 16 mg in 263mL (0.064 mg/mL) premix infusion, 0-40 mcg/min, Intravenous, Titrated, Mannam, Praveen, MD, Last Rate: 18.75 mL/hr at  06/18/22 0800, 20 mcg/min at 06/18/22 0800   Oral care mouth rinse, 15 mL, Mouth Rinse, Q2H, Sood, Vineet, MD, 15 mL at 06/18/22 0719   Oral care mouth rinse, 15 mL, Mouth Rinse, PRN, Chesley Mires, MD   pantoprazole (PROTONIX) injection 40 mg, 40 mg, Intravenous, Q24H, Sood, Vineet, MD, 40 mg at 06/17/22 2139   propofol (DIPRIVAN) 1000 MG/100ML infusion, 0-50 mcg/kg/min, Intravenous, Continuous, Greta Doom, MD, Last Rate: 3.87 mL/hr at 06/18/22 0800, 10 mcg/kg/min at 06/18/22 0800   rifaximin (XIFAXAN) tablet 550 mg, 550 mg, Per Tube, BID, Brand Males, MD, 550 mg at 06/17/22 2128   sodium chloride flush (NS) 0.9 % injection 10-40 mL, 10-40 mL, Intracatheter, Q12H, Julian Hy, DO, 10 mL at 06/17/22 2137   sodium chloride flush (NS) 0.9 % injection 10-40 mL, 10-40 mL, Intracatheter, PRN, Noemi Chapel P, DO   thiamine (VITAMIN B1) injection 100 mg, 100 mg, Intravenous, Daily, Sood, Vineet, MD, 100 mg at 06/17/22 0920   white petrolatum (VASELINE) gel, , Topical, PRN, Julian Hy, DO, 1 Application at 96/22/29 0809   Exam: Vitals:   06/18/22 0800 06/18/22 0815  BP: (!) 119/51 (!) 120/49  Pulse: (!) 107 (!) 106  Resp: (!) 22 (!) 22  Temp:    SpO2: 97% 97%   Physical Exam  Constitutional: Appears chronically ill  Psych: Minimally interactive Eyes: Scleral edema is minimal HENT: ET tube in place MSK: no joint deformities.  Cardiovascular: Mildly tachy Respiratory: Breathing comfortably on the ventilator,  GI: Mildly distended Skin: Scattered bruising throughout, some icterus   Neuro:  Mental Status: Does not open eyes spontaneously, to voice or noxious stimulation Does not follow any commands Cranial Nerves: II: No blink to threat. Pupils are equal, round, and reactive to light.   III,IV, VI/VIII: EOMI to VOR V/VII: Facial sensation is symmetric to eyelash brush VIII: No response to voice X/XI: Intact cough XII: Unable to assess tongue protrusion secondary  to patient's mental status  Motor/Sensory: Tone is normal. Bulk is normal. Slight extensor posturing bilateral upper extremities, left more brisk than right. Left lower extremity trace movement of toes to noxious stim, no movement of RLE Plantars: Toes are mute bilaterally.  Cerebellar: Unable to assess secondary to patient's mental status    Pertinent Labs:  Basic Metabolic Panel: Recent Labs  Lab 06/14/22 0306 06/15/22 0403 06/15/22 1037 06/15/22 2052 06/15/22 2107 06/16/22 0335 06/16/22 0826 06/17/22 0253 06/18/22 0338 06/18/22 0527  NA 151* 153*   < > 150*   < > 148* 143 141 137 140  K 3.3* 4.5   < > 4.6   < > 4.7 3.8 5.3* 2.7* 2.5*  CL 110 109  --  104  --  102 98 94* 94* 96*  CO2 26 27  --  31  --  31 25 26 24 24   GLUCOSE 167* 170*  --  129*  --  144* 208* 129* 144* 135*  BUN 51* 57*  --  70*  --  76* 68* 74* 71* 67*  CREATININE 0.59 0.89  --  1.88*  --  2.30* 2.24* 2.61* 2.19* 2.08*  CALCIUM 9.4 9.9  --  9.3  --  9.1 7.8* 8.3* 7.4* 7.4*  MG 2.6* 2.6*  --  3.0*  --  2.7*  --  2.8*  --   --   PHOS  --  3.6  --   --   --  6.1*  --  8.1*  --   --    < > = values in this interval not displayed.    CBC: Recent Labs  Lab 06/12/22 0541 06/13/22 0450 06/14/22 0306 06/15/22 0403 06/15/22 1037 06/15/22 2107 06/15/22 2238 06/16/22 0335 06/17/22 0253  WBC 11.0* 9.5 10.8* 13.1*  --   --   --  20.6* 23.5*  NEUTROABS 8.1* 6.9 8.1* 9.6*  --   --   --   --   --   HGB 8.9* 8.9* 9.1* 10.5* 10.9* 10.9* 10.9* 10.2* 9.8*  HCT 26.8* 29.2* 30.0* 32.6* 32.0* 32.0* 32.0* 32.7* 31.1*  MCV 116.0* 120.2* 121.0* 119.0*  --   --   --  118.9* 118.7*  PLT 207 229 216 277  --   --   --  361 324    Coagulation Studies: Recent Labs    06/16/22 0335  LABPROT 17.4*  INR 1.4*    CSF  Latest Reference Range & Units 06/01/22 13:43 06/18/22 11:04  Appearance, CSF CLEAR  CLOUDY ! HAZY !  Glucose, CSF 40 - 70 mg/dL 86 (H) 94 (H) -- serum 193 48% of serum  RBC Count, CSF 0 /cu mm 26,400  (H) 5,000 (H)  WBC, CSF 0 - 5 /cu mm 80 (HH) 191 (HH)  Segmented Neutrophils-CSF 0 - 6 % 37 (H) 3  Lymphs, CSF 40 - 80 % 53 92 (H)  Monocyte-Macrophage-Spinal Fluid 15 - 45 % 9 (L) 5 (L)  Eosinophils, CSF 0 - 1 % 1 0  Color, CSF COLORLESS  RED ! AMBER !  Supernatant  XANTHOCHROMIC XANTHOCHROMIC  Total  Protein, CSF  15 - 45 mg/dL >600 (H) >600 (H)  (HH): Data is critically high !: Data is abnormal (H): Data is abnormally high (L): Data is abnormally low Opening pressure today 25, closing pressure 19  EEG: EEG showed continuous 3-6 hz theta-delta slowing admixed with 1 to 2 seconds of generalized EEG attenuation at times. Hyperventilation and photic stimulation were not performed.    Of note, study was technically difficult to interpret due to significant electrode artifact.  MRI brain w/o contrast 06/17/2022 personally reviewed, agree with radiology:   No acute intracranial pathology or epileptogenic focus identified.   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, AKI, uremia -Toxic/metabolic encephalopathy in the setting of hyperammonemia, AKI with uremia, septic shock -Septic shock on Levophed (MSSA bacteremia, MSSE bacteremia (which may be a contaminant), Listeria bacteremia, Listeria meningitis, appreciate ID following) -Nonoliguric AKI likely secondary to ATN associated with shock, appreciate nephrology following -Alcoholic cirrhosis -Ileus/concern for partial small bowel obstruction, appreciate surgery following -Acute respiratory failure due to shock AKI and encephalopathy -Prolonged Qtc -Chronic anemia  Recommendations: - Given patient still quite sedated, continue EEG while weaning sedation today  - Keppra 3500 mg load, then 1000 mg BID; given renal function and  somnolence will reduce to 500 mg BID and continue to adjust as needed Estimated Creatinine Clearance: 27.3 mL/min (A) (by C-G formula based on SCr of 2.08 mg/dL (H)).   CrCl 80 to 130 mL/minute/1.73 m2: 500 mg to 1.5 g every 12 hours.  CrCl 50 to <80 mL/minute/1.73 m2: 500 mg to 1 g every 12 hours.  CrCl 30 to <50 mL/minute/1.73 m2: 250 to 750 mg every 12 hours.  CrCl 15 to <30 mL/minute/1.73 m2: 250 to 500 mg every 12 hours.  CrCl <15 mL/minute/1.73 m2: 250 to 500 mg every 24 hours (expert opinion). - Neurology will continue to follow  Lesleigh Noe MD-PhD [facility] [phone]   35 min critical care time

## 2022-06-18 NOTE — Progress Notes (Signed)
Nutrition Follow-up  DOCUMENTATION CODES:  Not applicable  INTERVENTION:  If tube feeds can't be restarted tomorrow, recommend initiation of TPN as tomorrow will be day 5 without nutrition. When able, restart tube feeding cortrak tube. Goal rate is as follows: Vital AF at 60 ml/h (1440 ml per day) Provides 1728 kcal, 108gm protein, 1168 ml free water daily Thiamine and folic acid daily for hx of EtOH abuse  NUTRITION DIAGNOSIS:  Inadequate oral intake related to inability to eat as evidenced by NPO status. - remains applicable  GOAL:  Patient will meet greater than or equal to 90% of their needs - progressing, being met with TF  MONITOR:  Vent status, Labs, TF tolerance  REASON FOR ASSESSMENT:  Ventilator    ASSESSMENT:  Pt with hx of EtOH abuse with cirrhosis presented to ED after being found unresponsive.   11/7 - intubated and admitted to ICU 11/15 - cortrak placed 11/22 - extubated 11/23 - NGT placed for suction for ileus, TF via cortrak held 11/24 - reintubated  Pt remains on the vent at this time after being reintubated 11/24. TF held 11/23 after imaging suggestive of an ileus. KUB obtained today shows small bowel dilation. Surgery is not planning on surgical interventions at this time. Hopeful to start TF back tomorrow if NGT output decreases. If unable to start feeds, would recommend the initiation of TPN as tomorrow with be 5 days without nutrition support in the presence of elevated nutrition needs.   Discussed pt in rounds.     MV: 9.5 L/min Temp (24hrs), Avg:100 F (37.8 C), Min:99.2 F (37.3 C), Max:101.4 F (38.6 C)  Intake/Output Summary (Last 24 hours) at 06/18/2022 1243 Last data filed at 06/18/2022 1200 Gross per 24 hour  Intake 3134.95 ml  Output 3400 ml  Net -265.05 ml  Net IO Since Admission: -1,594.57 mL [06/18/22 1243]  NGT: 1.3L out in 24 hours  Nutritionally Relevant Medications: Scheduled Meds:  folic acid  1 mg Intravenous Daily    insulin aspart  0-15 Units Subcutaneous Q4H   lactulose  300 mL Rectal Daily   pantoprazole IV  40 mg Intravenous Q24H   rifaximin  550 mg Per Tube BID   thiamine (VITAMIN B1) injection  100 mg Intravenous Daily   Continuous Infusions:  albumin human     linezolid (ZYVOX) IV 600 mg (06/18/22 1014)   norepinephrine (LEVOPHED) Adult infusion 24 mcg/min (06/18/22 0941)   propofol (DIPRIVAN) infusion 10 mcg/kg/min (06/18/22 0900)   Labs Reviewed: K 2.5 Chloride 96 BUN 67, creatinine 2.08 Phosphorus 8.1 Mg 2.8 Triglycerides 288 CBG ranges from 121-200 mg/dL over the last 24 hours  Lab Results  Component Value Date   ALT 20 06/18/2022   AST 51 (H) 06/18/2022   ALKPHOS 78 06/18/2022   BILITOT 3.4 (H) 06/18/2022    NUTRITION - FOCUSED PHYSICAL EXAM: Flowsheet Row Most Recent Value  Orbital Region No depletion  Upper Arm Region No depletion  Thoracic and Lumbar Region No depletion  Buccal Region No depletion  Temple Region No depletion  Clavicle Bone Region No depletion  Clavicle and Acromion Bone Region No depletion  Scapular Bone Region No depletion  Dorsal Hand No depletion  Patellar Region No depletion  Anterior Thigh Region No depletion  Posterior Calf Region No depletion  Edema (RD Assessment) Moderate  [pitting to BLE and BUE]  Hair Reviewed  Eyes Reviewed  Mouth Reviewed  Skin Reviewed  Nails Reviewed   Diet Order:   Diet Order  Diet NPO time specified  Diet effective now                  EDUCATION NEEDS:  Not appropriate for education at this time  Skin:  Skin Assessment: Reviewed RN Assessment  Last BM:  11/27 Rectal tube: 1.9L out in 24 hours  Height:  Ht Readings from Last 1 Encounters:  05/10/22 _0  (1.626 m)   Weight:  Wt Readings from Last 1 Encounters:  06/18/22 63.7 kg    Ideal Body Weight:  54.5 kg  BMI:  Body mass index is 24.11 kg/m.  Estimated Nutritional Needs:  Kcal:  1800-2000 kcal/d Protein:  90-110  g/d Fluid:  2L/d   Ranell Patrick, RD, LDN Clinical Dietitian RD pager # available in [place]  After hours/weekend pager # available in [facility]

## 2022-06-18 NOTE — Progress Notes (Signed)
NAME:  Deanna Mcmillan, MRN:  [id number], DOB:  1970/12/21, LOS: 38 ADMISSION DATE:  06/03/2022, CONSULTATION DATE:  11/7 REFERRING MD:  Lendon Colonel, EDP CHIEF COMPLAINT:  AMS   History of Present Illness:  51 yo woman with hx of recent diagnosis of alcoholic hepatitis (dx 81/44), here with ams, fever.   History from [name].  Patient was in her normal state of health, until suddenly the night PTA she developed a headache.  She went to sleep to rest.  The following day she slept throughout the day, that evening her mother went to wake her and was unable to wake her up, so she called 911.     She has been doing well since her recent hospital discharge, taking all the meds as prescribed, no new meds.  Planned follow up with GI on 11/14.  LE edema had been improving.  No cough, sob no focal symptoms.  No known sick contacts.     In ED, febrile, tachycardic, lethargic, desaturating to 70s.   Intubated by ED physician.    Had been admitted earlier in October and started on course of prenisone for alc hepatitis.    Recently seen at [facility] 10/19: LE Edema. Lasix not helpful at home.  Admitted for diuresis.  Jaundice and abd distension also noted.  Pleural effusion.  GI saw: non enough ascites for tap  Started lasix and spironolactone.  28 day course of prednisolone, miralax, lactulose, miralax. Midodrine TID    In ED got cefepime, LR 1L, flagyl ordered, intubated, started on Propofol, vanc ordered   Pertinent  Medical History  ETOH Cirrhosis BLE edema recently worsening  S/p lap appy 2014    Echo: recent normal 81/8563   Home meds: folic ascid, acetaminohpen, lasix 40bid, lactulose 30 TID protonix, prednisone (04/29/22), prednisolone 04/28/22 three week course, thiamine, simethicone,    Per her mother she had been drinking at least one bottle of wine daily, she isnt clear exactly how much she was drinking but she did also drink [place].  She notes she had wanted to stop recently but  been unable to.  She thinks she was depressed after experiencing perimenopause.  No tobacco.   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 11/18 more awake, following commands 1121 RUE PICC 11/22 extubated to BiPAP 11/23 - 14d of Rx complerted for MSSA/MESSE. For listeria - ID wants to extend amp/gent to 3 weeks 11/8-11/29. Ileus post extubation and NG to LIS . [place] increased. BP sofft an dlevophed and alb Rx.   11/24 -  OG placed yesterday for ileus and bile returns. TF on hold. Lactulose increased yesterday. REamins off vent. Did not need BiPAP. This AM more obtunded but mom thinks was awake at night and is sleeping due to fatigue. Afebrile.  But wBC up 13L. Neg baklance -3L:  OFF PRESSORs = [place]mm[place]ia up at 72 - [place] mild periphepatic ascites Blood culture  11/25 - Now intubated due to obtunded encephaopathy. CTAbc with SBO v Ileus with transition ponit . CCS consult - no surgery but ok for rectal lactulose w ith continue NG/OG tube to LIS. ID ordered repeat blood culture. Linezolid added.  No ntrition since 06/14/22 Pm. Creat worsening. LEss Ur OP. On levophed 68mcg. Not on sedation. Vent 40% Ammonia 108 Trach aspirate  Interim History / Subjective:   11/27 - Remains intubated. On propofol 52mcg and levophed 4mcg. Febrile up to 101.31F overnight. On ampicillin and linezolid, going for IR  guided LP today. Kidney function improving, repleting potassium. No seizure activity noted overnight on EEG. Surgery following for ileus, no need for surgical intervention. Holding TF for today.    Objective   Blood pressure (!) 124/56, pulse (!) 109, temperature 99.9 F (37.7 C), temperature source Axillary, resp. rate (!) 21, weight 63.7 kg, SpO2 97 %.    Vent Mode: PRVC FiO2 (%):  [40 %] 40 % Set Rate:  [20 bmp] 20 bmp Vt Set:  [430 mL] 430 mL PEEP:  [5 cmH20] 5 cmH20 Plateau Pressure:  [11 cmH20-39 cmH20] 39 cmH20   Intake/Output Summary (Last 24 hours) at 06/18/2022 1003 Last data filed at 06/18/2022 0941 Gross per 24 hour  Intake 3093.14 ml  Output 4250 ml  Net -1156.86 ml   Filed Weights   06/16/22 0545 06/17/22 0400 06/18/22 0355  Weight: 64.5 kg 64.5 kg 63.7 kg     General Appearance: critically ill-appearing middle aged female, on mech vent, NAD. HEENT: [place]/AT, scleral icterus present, PERRL. G tube in place (NG and cortrak), ETT tube in place.  CV: tachycardic rate and regular rhythm, no murmur noted.   Pulm: ventilated breath sounds, no overt adventitious sounds noted. GI: soft, mildly distended, bowel sounds present. Skin: warm and dry Extremities: bruising noted on upper extremities bilaterally. Neuro: sedated, RASS -4   Resolved Hospital Problem list   Ileus Hypokalemia Hypophosphatemia Hypernatremia Thrombocytopenia  Assessment & Plan:   Hypotension/Circulatory shock -recurred 06/14/22, and again 06/15/22 Remains on levophed gtt, currently at 15mcg. Midodrine stopped given ileus. Given concern for HRS, will maintain MAP goal >80 at this time. Received weight-based albumin for the last 2 days, will given additional 25g today.   Plan  - MAP goal >80, on pressor support - holding midodrine given ileus - albumin 25g today - holding lasix and aldactone   Encephalopathy - hepatic and metabolic New Seizure (02/72) 2/2 hyperammonemia, listeria  meningitis, uremia Encephalopathy worsened after seizure activity and correlated with worsening ammonia level. She is currently being treated for meningitis which could have precipitated seizure activity. MRI brain yesterday without any acute findings. Technically difficult EEG (significant artifact) but without seizures or epileptiform discharges. Kidney function is gradually improving.   Plan: -rifaximin per tube -rectal lactulose (had 1.9L stool output, decreased frequency to daily) -NPO -neuro following, appreciate assistance -keppra -on propofol gtt, RASS goal 0 to -2   Alcoholic cirrhosis - Recently diagnosed cirrhosis during last hospitalization. MELD Na score of 17. Child Pugh Class C. FIB-4 score of 7.46 indicates advance fibrosis. Peritoneal labs admit  not consistent with SBP, however, patient received abx prior to tap. MELD score 22 as of 11/25 (worsening kidney function and hyperbilirubinemia)  Plan - supportive care - consider GI consult if worsening   ILEUS - recurred 06/14/22  CT imaging 11/24 showing multiple dilated loops of small bowel with a transition zone in the mid to distal ileum consistent with a least partial small bowel obstruction.  Per CCS, surgical intervention not indicated due to risk. Unclear if pSBO versus ileus. She is still having significant stool output (1.9L over 24 hrs). Repeat abdominal films 11/27 showing persistence of SBO pattern.   Plan - NPO - lactulose enema to promote BM - monitor    Listeria bacteremia and meningitis MSSA bacteremia  Blood cultures 11/24 > no growth for past 3 days. Trach aspirate cultures with rate klebsiella oxytoca. For listeriosis, transitioned to ampicillin monotherapy (gentamicin stopped due to renal function). For MSSA, she was transitioned to cefazolin.   Plan -ampicillin, cefazolin -appreciate ID recommendations -will need TEE once extubated    Acute hypoxic respiratory failure (CAP versus aspiration  pneumonia and Compressive atelectasis )  - Extubated 11/22  - reintubated 06/15/22  06/18/2022 - > does not meet criteria for SBT/Extubation in setting of Acute Respiratory Failure due to shock, AKI, encephalopathy  Plan  - Oral care - HOB > 30 - PRVC - No SBT   AKI (Baseline 0.6- 0.9) Likely prerenal etiology, worry for HRS given cirrhosis. She did have some response to albumin therapy with improvement in her kidney function. Will continue with albumin through today to finish therapy.   Plan - MAP goal >= 80 - nephrology following  Hx SVT Prolonged Qtc,  - 530msec 06/04/22  - 513 msec 06/17/22  Plan - monitor - avoid Qtc prolongation drus  Anemia - chronic (baseline 8gm-9gm% in oct 2023)- Present on Admit  11/27 - no active bleeding  Plan  - - PRBC for hgb </= 6.9gm%    - exceptions are   -  if ACS susepcted/confirmed then transfuse for hgb </= 8.0gm%,  or    -  active bleeding with hemodynamic instability, then transfuse regardless of hemoglobin value   At at all times try to transfuse 1 unit prbc as possible with exception of active hemorrhage  Protein caloric malnutrition, mild  11/27 - TF remain on hold given ileus.  Plan - hold off TPN - D5w for now kvo  Hyperglycemia, controlled -continue SSI prn -goal BG 140-180  Severe physical deconditioning  Plan - She will need aggressive PT/OT (likely CIR) for strength training.   Best Practice (right click and "Reselect all SmartList Selections" daily)   Diet/type: tubefeeds - on hold since 06/14/22 due to ileus ->  DVT prophylaxis: prophylactic heparin  GI prophylaxis: PPI Lines: Central line - rUE PICC since 06/12/22 Foley:  Yes, and it is still needed Code Status:  full code Last date of multidisciplinary goals of care discussion [updated mother at bedside 11/22 and 06/14/21 and 06/15/22, 06/16/22 at bedside    Interdisciplinary Goals of Care Family Meeting   Date carried out::  06/18/2022  Location of the meeting: Bedside  Member's involved: Physician, Bedside Registered Nurse, Family Member or next of kin, and Other: Mom and [place] or Loss adjuster, chartered: mom    Discussion: We discussed goals of care for [employer] .  MODS  Code status: Full Code  Disposition: Continue current acute care    Time spent for the meeting: 10 min       Virl Axe, MD 06/18/2022, 10:04 AM    LABS    PULMONARY Recent Labs  Lab 06/15/22 1037 06/15/22 2107 06/15/22 2238  PHART 7.557* 7.564* 7.502*  PCO2ART 35.2 38.2 43.8  PO2ART 67* 91 69*  HCO3 31.3* 34.7* 34.5*  TCO2 32 36* 36*  O2SAT 95 98 95    CBC  Recent Labs  Lab 06/15/22 0403 06/15/22 1037 06/15/22 2238 06/16/22 0335 06/17/22 0253  HGB 10.5*   < > 10.9* 10.2* 9.8*  HCT 32.6*   < > 32.0* 32.7* 31.1*  WBC 13.1*  --   --  20.6* 23.5*  PLT 277  --   --  361 324   < > = values in this interval not displayed.    COAGULATION Recent Labs  Lab 06/16/22 0335  INR 1.4*    CHEMISTRY Recent Labs  Lab 06/14/22 0306 06/15/22 0403 06/15/22 1037 06/15/22 2052 06/15/22 2107 06/16/22 0335 06/16/22 0826 06/17/22 0253 06/18/22 0338 06/18/22 0527  NA 151* 153*   < > 150*   < > 148* 143 141 137 140  K 3.3* 4.5   < > 4.6   < > 4.7 3.8 5.3* 2.7* 2.5*  CL 110 109  --  104  --  102 98 94* 94* 96*  CO2 26 27  --  31  --  31 25 26 24 24   GLUCOSE 167* 170*  --  129*  --  144* 208* 129* 144* 135*  BUN 51* 57*  --  70*  --  76* 68* 74* 71* 67*  CREATININE 0.59 0.89  --  1.88*  --  2.30* 2.24* 2.61* 2.19* 2.08*  CALCIUM 9.4 9.9  --  9.3  --  9.1 7.8* 8.3* 7.4* 7.4*  MG 2.6* 2.6*  --  3.0*  --  2.7*  --  2.8*  --   --   PHOS  --  3.6  --   --   --  6.1*  --  8.1*  --   --    < > = values in this interval not displayed.   Estimated Creatinine Clearance: 27.3 mL/min (A) (by C-G formula based on SCr of 2.08 mg/dL (H)).   LIVER Recent Labs  Lab  06/14/22 0306 06/15/22 0403 06/16/22 0335 06/17/22 0253 06/18/22 0338  AST 49* 57* 62* 60* 51*  ALT 17 21 21 21 20   ALKPHOS 105 112 108 107 78  BILITOT 2.2* 2.7* 2.8* 3.3* 3.4*  PROT 6.8 7.2 7.1 7.4 6.5  ALBUMIN 2.7* 3.0* 2.9* 3.8 3.3*  INR  --   --  1.4*  --   --      INFECTIOUS Recent Labs  Lab 06/15/22 1054 06/16/22 0335 06/16/22 0826 06/17/22 0253 06/17/22 0514  LATICACIDVEN 1.7 2.1* 1.9 6.8* 1.8  PROCALCITON 0.55 1.63  --  0.78  --      ENDOCRINE CBG (last 3)  Recent Labs    06/17/22 2310 06/18/22 0335 06/18/22 0731  GLUCAP 200* 149* 131*         IMAGING x48h  - image(s) personally visualized  -   highlighted in bold DG Abd Portable 1V  Result Date: 06/18/2022 CLINICAL DATA:  Small-bowel obstruction EXAM: PORTABLE ABDOMEN - 1 VIEW COMPARISON:  06/17/2022 FINDINGS: Nasogastric tube tip in the antrum of the stomach. Soft feeding tube tip is now in the region of the pylorus, previously in the descending duodenum. Persistent small bowel obstruction pattern, probably slightly worsened radiographically. Small bowel dilatation measures up to 4.6 cm. IMPRESSION: 1. Nasogastric tube tip in the antrum of the stomach. Soft feeding tube tip now in the region of the pylorus, previously in the descending duodenum. 2. Persistent small bowel obstruction pattern, probably slightly worsened. Electronically Signed   By: Nelson Chimes M.D.   On: 06/18/2022 08:02   MR BRAIN WO CONTRAST  Result  Date: 06/17/2022 CLINICAL DATA:  Seizure EXAM: MRI HEAD WITHOUT CONTRAST TECHNIQUE: Multiplanar, multiecho pulse sequences of the brain and surrounding structures were obtained without intravenous contrast. COMPARISON:  CT head obtained the same day and CT head 06/02/2022 FINDINGS: Brain: There is no acute intracranial hemorrhage, extra-axial fluid collection, or acute infarct. Areas of sulcal FLAIR hyperintensity may be related to intubation/oxygenation given normal same-day head CT.  Parenchymal volume is normal. The ventricles are normal in size. Gray-white differentiation is preserved. There is mild FLAIR signal abnormality in the subcortical and periventricular white matter which is nonspecific but may reflect sequela of mild chronic small-vessel ischemic change. The hippocampi are normal in signal and architecture. There is no mass lesion.  There is no mass effect or midline shift. Vascular: Normal flow voids. Skull and upper cervical spine: Normal marrow signal. Sinuses/Orbits: The paranasal sinuses are clear. The globes and orbits are unremarkable. Other: There are bilateral mastoid effusions which may be related to instrumentation. IMPRESSION: No acute intracranial pathology or epileptogenic focus identified. Electronically Signed   By: Valetta Mole M.D.   On: 06/17/2022 17:57   DG Abd Portable 1V-Small Bowel Obstruction Protocol-24 hr delay  Result Date: 06/17/2022 CLINICAL DATA:  Nasogastric tube placement. EXAM: PORTABLE ABDOMEN - 1 VIEW COMPARISON:  June 16, 2022. FINDINGS: Distal tip of nasogastric tube is seen in expected position of distal stomach. Distal tip of feeding tube is seen in expected position of proximal duodenum. Continued small bowel dilatation is noted. IMPRESSION: Stable position of nasogastric and feeding tubes. Grossly stable small bowel dilatation. Electronically Signed   By: Marijo Conception M.D.   On: 06/17/2022 08:33   Overnight EEG with video  Result Date: 06/17/2022 Lora Havens, MD     06/18/2022  9:16 AM Patient Name: KANDANCE YANO MRN: [id number] Epilepsy Attending: Lora Havens Referring Physician/Provider: Donnetta Simpers, MD Duration: 06/17/2022 6734 to 06/18/2022 1937 Patient history: 51yo F with GTC seizure. EEG to evaluate for seizure. Level of alertness: comatose AEDs during EEG study: LEV, Propofol Technical aspects: This EEG study was done with scalp electrodes positioned according to the 10-20 International system of  electrode placement. Electrical activity was reviewed with band pass filter of 1-70Hz , sensitivity of 7 uV/mm, display speed of 22mm/sec with a 60Hz  notched filter applied as appropriate. EEG data were recorded continuously and digitally stored.  Video monitoring was available and reviewed as appropriate. Description: EEG showed burst suppression pattern with burst of 5-7hz  theta slowing lasting 1 seconds alternating with 2-5 seconds of generalized suppression. Hyperventilation and photic stimulation were not performed.   Of note, study was technically difficult to interpret due to significant electrode artifact. ABNORMALITY - Burst suppression, generalized IMPRESSION: This technically difficult study is suggestive of profound diffuse encephalopathy, nonspecific etiology but likely related to sedation. No seizures or epileptiform discharges were seen throughout the recording. Priyanka Barbra Sarks   CT HEAD WO CONTRAST (5MM)  Result Date: 06/17/2022 CLINICAL DATA:  Seizure, new onset. Meningitis/CNS infection suspected. Mental status change, unknown cause. EXAM: CT HEAD WITHOUT CONTRAST TECHNIQUE: Contiguous axial images were obtained from the base of the skull through the vertex without intravenous contrast. RADIATION DOSE REDUCTION: This exam was performed according to the departmental dose-optimization program which includes automated exposure control, adjustment of the mA and/or kV according to patient size and/or use of iterative reconstruction technique. COMPARISON:  06/09/2022. FINDINGS: Brain: No acute intracranial hemorrhage, midline shift or mass effect. No extra-axial fluid collection. Mild atrophy is noted. Periventricular white  matter hypodensities are present bilaterally. No hydrocephalus. Vascular: No hyperdense vessel or unexpected calcification. Skull: Normal. Negative for fracture or focal lesion. Sinuses/Orbits: No acute finding. Other: Tubes are present in the nasopharynx. A few opacities are  noted in the mastoid air cells on the right. IMPRESSION: 1. No acute intracranial hemorrhage. 2. Atrophy with chronic microvascular ischemic changes. Electronically Signed   By: Brett Fairy M.D.   On: 06/17/2022 04:55   DG Abd 1 View  Result Date: 06/16/2022 CLINICAL DATA:  Evaluate NG tube placement EXAM: ABDOMEN - 1 VIEW COMPARISON:  June 16, 2022 FINDINGS: The NG tube terminates in the distal stomach. The feeding tube terminates in the distal second portion of the duodenum. Continued small bowel obstruction with dilated loops of small bowel. IMPRESSION: 1. The NG tube terminates in the distal stomach. The feeding tube terminates in the distal second portion of the duodenum. 2. Continued small bowel obstruction. Electronically Signed   By: Dorise Bullion III M.D.   On: 06/16/2022 12:57   DG Abd Portable 1V-Small Bowel Obstruction Protocol-initial, 8 hr delay  Result Date: 06/16/2022 CLINICAL DATA:  Small bowel obstruction. EXAM: PORTABLE ABDOMEN - 1 VIEW COMPARISON:  KUB June 15, 2022. CT the abdomen and pelvis June 15, 2022. FINDINGS: The feeding tube is been advanced and appears to terminate in the distal second portion of the duodenum. The NG tube terminates in the stomach. Dilated loops of small bowel are identified throughout the abdomen consistent with on going small-bowel obstruction. IMPRESSION: 1. Support apparatus as above. 2. Continued small bowel obstruction. Electronically Signed   By: Dorise Bullion III M.D.   On: 06/16/2022 11:52

## 2022-06-18 NOTE — Progress Notes (Signed)
Pt transported from [id number] to [facility] and back on full support vent settings without complication. RT, RN and transport accompanied pt.

## 2022-06-18 NOTE — Progress Notes (Signed)
[facility] Progress Note  Name: Deanna Mcmillan MRN: [id number] DOB: Nov 20, 1970  Chief Complaint:  Headache and AMS on presentation  Subjective:  She has been in the ICU.  She had 700 mL uop over 11/26.  Her mother is at bedside.  She has been on levo at 22 mcg/min.   Review of systems:  Unable to obtain secondary to intubated and sedated   ------------------ Background on consult:  Deanna Mcmillan is a/an 51 y.o. female with a past medical history of alcoholic cirrhosis who presents with sepsis now c/b AKI.  Patient has had a fairly prolonged hospital stay.  History was obtained per chart review and family.  Patient initially presented 18 days ago.  She had a recent history of alcoholic hepatitis in October of this year.  Otherwise she was in her normal state of health but the night before she came she developed a headache and developed altered mental status.  In the hospital she was found to be septic.  Because of her altered mental status she was intubated at that time.  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 She was started on treatment. 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 Developed an ileus early in the hospitalization but thi resolved. Then she was extubated on 11/22 and was doing okay on nasal cannular and briefly was off norepinephrine. Then around 11/24 she worsened again requiring intubated. Further complications include seizures last night and increasing pressor requirements now on 79mcg of norepi. UOP has dropped off but still with about 500cc/day. She developed recurrent ileus and now has NG tube in place again. Crt was 0.6 on 11/23 and started rising the next day now up to 2.6.   Intake/Output Summary (Last 24 hours) at 06/18/2022 0633 Last data filed at 06/18/2022 0600 Gross per 24 hour  Intake 3688.4 ml  Output 3900 ml  Net -211.6 ml    Vitals:  Vitals:   06/18/22 0400 06/18/22 0500 06/18/22 0517 06/18/22 0600  BP: (!)  109/51 (!) 95/42 (!) 113/49 (!) 112/48  Pulse: (!) 122 (!) 119 (!) 116 (!) 114  Resp: (!) 22 (!) 21 (!) 22 (!) 22  Temp:   100.1 F (37.8 C)   TempSrc:   Oral   SpO2: 97% 97% 97% 97%  Weight:         Physical Exam:  General adult female in bed intubated HEENT normocephalic atraumatic  Neck normal circumference trachea midline Lungs clear to auscultation anteriorly no air hunger; FIO2 40, PEEP 5  Heart S1S2 no rub Abdomen soft nontender nondistended Extremities no edema appreciated Neuro - sedation currently running    Medications reviewed   Labs:     Latest Ref Rng & Units 06/18/2022    3:38 AM 06/17/2022    2:53 AM 06/16/2022    8:26 AM  BMP  Glucose 70 - 99 mg/dL 144  129  208   BUN 6 - 20 mg/dL 71  74  68   Creatinine 0.44 - 1.00 mg/dL 2.19  2.61  2.24   Sodium 135 - 145 mmol/L 137  141  143   Potassium 3.5 - 5.1 mmol/L 2.7  5.3  3.8   Chloride 98 - 111 mmol/L 94  94  98   CO2 22 - 32 mmol/L 24  26  25    Calcium 8.9 - 10.3 mg/dL 7.4  8.3  7.8      Assessment/Plan:    Non-Oliguric AKI: Likely secondary to ATN  associated with shock. Does not appear dehydrated on exam. Foley in place. - Management of shock as below - Continue supportive care  - Please renally dose antibiotics.    - Maintain MAP>65 for optimal renal perfusion.    Septic shock: on Levophed per primary team.  Antibiotics per primary team.  Hypokalemia - stop lokelma 10 gram TID (agree).  Note until recently K ran a little low.  Note there is also a repeat BMP in process.  I do believe initial result given her med history.  Potassium 40 meq PO once then repeat potassium at 10:00 am   AMS/seizures: Using lactulose and rifaximin.  Neurology involved.   Alcoholic cirrhosis: Complicates overall picture.  Continue with supportive care   Ileus: Continues to have some stool passage despite imaging with transition zone.  Per primary team    Listeria bacteremia and meningitis: Infectious disease  following.  abx per primary team and ID   Acute hypoxic respiratory failure: Ventilatory support per primary team   Disposition - per primary team   Claudia Desanctis, MD 06/18/2022 6:55 AM

## 2022-06-18 NOTE — Progress Notes (Addendum)
eLink Physician-Brief Progress Note Patient Name: Deanna Mcmillan DOB: 16-Apr-1971 MRN: [id number]   Date of Service  06/18/2022  HPI/Events of Note  K of 2.7? Was 5.3 yesterday and started on lokelma.   eICU Interventions  Stop Lokelma Recheck bmp now to confirm     Intervention Category Major Interventions: Electrolyte abnormality - evaluation and management  Jameire Kouba G Jalyric Kaestner 06/18/2022, 5:27 AM  Addendum - Notified repeat K is 2.5. Was placing orders when I see Nephrology MD already ordered Kcl. Defer to them.

## 2022-06-18 NOTE — Progress Notes (Addendum)
[facility]    Date of Admission:  05/26/2022    ID: Deanna Mcmillan is a 51 y.o. female with severe sepsis with listeria bacteremia/meningitis decompensated 4 days ago requiring re-intubation (11/24), pressors, LT EEG monitoring; possible ileus Principal Problem:   Acute respiratory failure with hypoxia (HCC) Active Problems:   Pressure injury of skin   Toxic metabolic encephalopathy   On mechanically assisted ventilation (HCC)   Sepsis with acute hypoxic respiratory failure (HCC)   Listeriosis   Acute pulmonary edema (HCC)   Septic shock (HCC)   Compromised airway   MSSA bacteremia   Small bowel obstruction (HCC)    Subjective: Afebrile but still requiring pressor support. LT EEG monitoring showing diffuse encephalopathy; had GTC seizure in early 11/26.   She underwent LP - still bloody tap with WBC 191/RBC 5000/ Pro>600/glu 94. Gram stain negative. 92% lymphocyte  Labs showing WBC 23.6K with hyperammonemia and hypokalemia  Medications:   Chlorhexidine Gluconate Cloth  6 each Topical Daily   folic acid  1 mg Intravenous Daily   heparin  5,000 Units Subcutaneous Q8H   insulin aspart  0-15 Units Subcutaneous Q4H   lactulose  300 mL Rectal Daily   leptospermum manuka honey  1 Application Topical Daily   mouth rinse  15 mL Mouth Rinse Q2H   pantoprazole (PROTONIX) IV  40 mg Intravenous Q24H   rifaximin  550 mg Per Tube BID   sodium chloride flush  10-40 mL Intracatheter Q12H   thiamine (VITAMIN B1) injection  100 mg Intravenous Daily    Objective: Vital signs in last 24 hours: Temp:  [98.7 F (37.1 C)-101.4 F (38.6 C)] 98.7 F (37.1 C) (11/27 1535) Pulse Rate:  [106-123] 109 (11/27 1600) Resp:  [20-29] 22 (11/27 1600) BP: (95-146)/(42-122) 140/63 (11/27 1600) SpO2:  [94 %-100 %] 97 % (11/27 1600) FiO2 (%):  [40 %] 40 % (11/27 1310) Weight:  [63.7 kg] 63.7 kg (11/27 0355)  Physical Exam  Constitutional:  obtunded appears  well-developed and well-nourished. No distress.  HENT: [place]/AT, PERRLA, no scleral icterus Mouth/Throat: OETT in place Cardiovascular: tachy; regular rhythm and normal heart sounds. Exam reveals no gallop and no friction rub.  No murmur heard.  Pulmonary/Chest: Effort normal and breath sounds normal. No respiratory distress.  has no wheezes.  Neck = supple, no nuchal rigidity Abdominal: Soft. Bowel sounds are decreased.  exhibits no distension.  Ext: anasarca, scattered echymosis on upper extremities Neurological: alert and oriented to person, place, and time.  Skin: Skin is warm and dry. No rash noted. No erythema.  Psychiatric: a normal mood and affect.  behavior is normal.    Lab Results Recent Labs    06/16/22 0335 06/16/22 0826 06/17/22 0253 06/18/22 0338 06/18/22 0527 06/18/22 0954  WBC 20.6*  --  23.5*  --   --   --   HGB 10.2*  --  9.8*  --   --   --   HCT 32.7*  --  31.1*  --   --   --   NA 148*   < > 141 137 140  --   K 4.7   < > 5.3* 2.7* 2.5* 2.6*  CL 102   < > 94* 94* 96*  --   CO2 31   < > 26 24 24   --   BUN 76*   < > 74* 71* 67*  --   CREATININE 2.30*   < > 2.61* 2.19* 2.08*  --    < > =  values in this interval not displayed.   Liver Panel Recent Labs    06/17/22 0253 06/18/22 0338  PROT 7.4 6.5  ALBUMIN 3.8 3.3*  AST 60* 51*  ALT 21 20  ALKPHOS 107 78  BILITOT 3.3* 3.4*   Sedimentation Rate No results for input(s): "ESRSEDRATE" in the last 72 hours. C-Reactive Protein No results for input(s): "CRP" in the last 72 hours.  Microbiology: Trach aspirate 11/25: kleb oxytoca Csf cx 11/27: pending Blood cx 11/24: NGTD Csf cx 11/10: NGTD Studies/Results: DG FL GUIDED LUMBAR PUNCTURE  Result Date: 06/18/2022 CLINICAL DATA:  Listeria meningitis, encephalopathy EXAM: DIAGNOSTIC LUMBAR PUNCTURE UNDER FLUOROSCOPIC GUIDANCE COMPARISON:  06/01/22. FLUOROSCOPY: Radiation Exposure Index (as provided by the fluoroscopic device): 2.40 mGy Kerma PROCEDURE:  Informed consent was obtained from the patient's mother prior to the procedure, including potential complications of headache, allergy, and pain. With the patient prone, the lower back was prepped with Betadine. 1% Lidocaine was used for local anesthesia. Lumbar puncture was performed at the L3-L4 level using a 20g gauge needle with return of yellow, clear CSF with an opening pressure of 25 cm water. 15 ml of CSF were obtained for laboratory studies. Closing pressure was measured at 19 cm water. The patient tolerated the procedure well and there were no apparent complications. IMPRESSION: Successful lumbar puncture as described above. This exam was performed by Pasty Spillers, PA-C, and was supervised and interpreted by Lajean Manes, MD. Electronically Signed   By: Lajean Manes M.D.   On: 06/18/2022 11:15   DG Abd Portable 1V  Result Date: 06/18/2022 CLINICAL DATA:  Small-bowel obstruction EXAM: PORTABLE ABDOMEN - 1 VIEW COMPARISON:  06/17/2022 FINDINGS: Nasogastric tube tip in the antrum of the stomach. Soft feeding tube tip is now in the region of the pylorus, previously in the descending duodenum. Persistent small bowel obstruction pattern, probably slightly worsened radiographically. Small bowel dilatation measures up to 4.6 cm. IMPRESSION: 1. Nasogastric tube tip in the antrum of the stomach. Soft feeding tube tip now in the region of the pylorus, previously in the descending duodenum. 2. Persistent small bowel obstruction pattern, probably slightly worsened. Electronically Signed   By: Nelson Chimes M.D.   On: 06/18/2022 08:02   MR BRAIN WO CONTRAST  Result Date: 06/17/2022 CLINICAL DATA:  Seizure EXAM: MRI HEAD WITHOUT CONTRAST TECHNIQUE: Multiplanar, multiecho pulse sequences of the brain and surrounding structures were obtained without intravenous contrast. COMPARISON:  CT head obtained the same day and CT head 06/13/2022 FINDINGS: Brain: There is no acute intracranial hemorrhage, extra-axial  fluid collection, or acute infarct. Areas of sulcal FLAIR hyperintensity may be related to intubation/oxygenation given normal same-day head CT. Parenchymal volume is normal. The ventricles are normal in size. Gray-white differentiation is preserved. There is mild FLAIR signal abnormality in the subcortical and periventricular white matter which is nonspecific but may reflect sequela of mild chronic small-vessel ischemic change. The hippocampi are normal in signal and architecture. There is no mass lesion.  There is no mass effect or midline shift. Vascular: Normal flow voids. Skull and upper cervical spine: Normal marrow signal. Sinuses/Orbits: The paranasal sinuses are clear. The globes and orbits are unremarkable. Other: There are bilateral mastoid effusions which may be related to instrumentation. IMPRESSION: No acute intracranial pathology or epileptogenic focus identified. Electronically Signed   By: Valetta Mole M.D.   On: 06/17/2022 17:57   DG Abd Portable 1V-Small Bowel Obstruction Protocol-24 hr delay  Result Date: 06/17/2022 CLINICAL DATA:  Nasogastric tube placement. EXAM: PORTABLE  ABDOMEN - 1 VIEW COMPARISON:  June 16, 2022. FINDINGS: Distal tip of nasogastric tube is seen in expected position of distal stomach. Distal tip of feeding tube is seen in expected position of proximal duodenum. Continued small bowel dilatation is noted. IMPRESSION: Stable position of nasogastric and feeding tubes. Grossly stable small bowel dilatation. Electronically Signed   By: Marijo Conception M.D.   On: 06/17/2022 08:33   Overnight EEG with video  Result Date: 06/17/2022 Lora Havens, MD     06/18/2022  9:16 AM Patient Name: Deanna Mcmillan MRN: [id number] Epilepsy Attending: Lora Havens Referring Physician/Provider: Donnetta Simpers, MD Duration: 06/17/2022 7062 to 06/18/2022 3762 Patient history: 51yo F with GTC seizure. EEG to evaluate for seizure. Level of alertness: comatose AEDs during EEG  study: LEV, Propofol Technical aspects: This EEG study was done with scalp electrodes positioned according to the 10-20 International system of electrode placement. Electrical activity was reviewed with band pass filter of 1-70Hz , sensitivity of 7 uV/mm, display speed of 55mm/sec with a 60Hz  notched filter applied as appropriate. EEG data were recorded continuously and digitally stored.  Video monitoring was available and reviewed as appropriate. Description: EEG showed burst suppression pattern with burst of 5-7hz  theta slowing lasting 1 seconds alternating with 2-5 seconds of generalized suppression. Hyperventilation and photic stimulation were not performed.   Of note, study was technically difficult to interpret due to significant electrode artifact. ABNORMALITY - Burst suppression, generalized IMPRESSION: This technically difficult study is suggestive of profound diffuse encephalopathy, nonspecific etiology but likely related to sedation. No seizures or epileptiform discharges were seen throughout the recording. Priyanka Barbra Sarks   CT HEAD WO CONTRAST (5MM)  Result Date: 06/17/2022 CLINICAL DATA:  Seizure, new onset. Meningitis/CNS infection suspected. Mental status change, unknown cause. EXAM: CT HEAD WITHOUT CONTRAST TECHNIQUE: Contiguous axial images were obtained from the base of the skull through the vertex without intravenous contrast. RADIATION DOSE REDUCTION: This exam was performed according to the departmental dose-optimization program which includes automated exposure control, adjustment of the mA and/or kV according to patient size and/or use of iterative reconstruction technique. COMPARISON:  05/28/2022. FINDINGS: Brain: No acute intracranial hemorrhage, midline shift or mass effect. No extra-axial fluid collection. Mild atrophy is noted. Periventricular white matter hypodensities are present bilaterally. No hydrocephalus. Vascular: No hyperdense vessel or unexpected calcification. Skull:  Normal. Negative for fracture or focal lesion. Sinuses/Orbits: No acute finding. Other: Tubes are present in the nasopharynx. A few opacities are noted in the mastoid air cells on the right. IMPRESSION: 1. No acute intracranial hemorrhage. 2. Atrophy with chronic microvascular ischemic changes. Electronically Signed   By: Brett Fairy M.D.   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  Repeat CSF analysis once corrected for RBCs - still shows pleocytosis of 155 - lymphocytic predominance. Elevated protein but not low glucose. Gram stain negative. It would be exceptionally unusual to have bacterial meningitis onset while hospitalized. Suspect this lymphocytic predominance is either aseptic meningitis/inflammation/leptomeningeal process. Brain MRI non-con was not very revealing for what has caused acute decompensation and new onset seizure. Tb Meningitis usually has undetectable glucose which is less likely in the differential for this case  Meningitis panel did not show cryptococcal disease, which can see as reactivation of disease process in cirrhotics. Will check cryptococcal antigen in CSF/serum.  Trach aspirate cultures showing prelim drug resistant K.oxytoca.  For now, we  will  plan to change abtx to meropenem (which has listeria coverage, in addition klebsiella and MSSA coverage)  MSSA bacteremia = currently on meropenem. Initially thought to be simple bacteremia. Continue to monitor repeat blood cx which are NGTD at 72hrs.   [facility] Pager: [phone]  06/18/2022, 4:20 PM

## 2022-06-18 NOTE — Progress Notes (Incomplete)
Attending note: I have seen and examined the patient. History, labs and imaging reviewed.  51 Y/O alcohol cirrhosis admit with altered mental status and fevers, listeria meningitis and MSSA, MSSE bacteremia. Re intubated on 11/25 with seizures, obtundation. with increasing WBC counts repeat LP attemped yesterday but unable to complete at [facility]  Blood pressure (!) 120/49, pulse (!) 106, temperature 99.9 F (37.7 C), temperature source Axillary, resp. rate (!) 22, weight 63.7 kg, SpO2 97 %. Gen:      No acute distress HEENT:  EOMI, sclera anicteric, ETT Neck:     No masses; no thyromegaly Lungs:    Clear to auscultation bilaterally; normal respiratory effort CV:         Regular rate and rhythm; no murmurs Abd:      Distended, + BS Ext:    No edema; adequate peripheral perfusion Skin:      Warm and dry; no rash Neuro: Sedated, unresponsive  Labs/Imaging personally reviewed, significant for K 2.7 Cr 2.08 WBC increased to 23.5 CXR with worse ileus  Assessment/plan: Shock Septic vs circulatory Wean pressors as tolerated. Maintaining a higher MAP goal of 80 for treatment of HRS Given albumin 25 mg IV once.  Hold lasix and aldactone Ampicillin and linezolid  Acute metabolic enchalopahty Continue keppra Lactulose and rifaximin MRI does not show acute abnormalities Continuous EEG  Alcohol cirrhosis Supportive care   Ileus Surgery is following Keep NPO  The patient is critically ill with multiple organ systems failure and requires high complexity decision making for assessment and support, frequent evaluation and titration of therapies, application of advanced monitoring technologies and extensive interpretation of multiple databases.  Critical care time - 35 mins. This represents my time independent of the NPs time taking care of the pt.  Marshell Garfinkel MD [facility] 06/18/2022, 8:40 AM

## 2022-06-19 ENCOUNTER — Inpatient Hospital Stay (HOSPITAL_COMMUNITY): Payer: 59

## 2022-06-19 DIAGNOSIS — R569 Unspecified convulsions: Secondary | ICD-10-CM | POA: Diagnosis not present

## 2022-06-19 DIAGNOSIS — J9601 Acute respiratory failure with hypoxia: Secondary | ICD-10-CM | POA: Diagnosis not present

## 2022-06-19 LAB — GLUCOSE, CAPILLARY
Glucose-Capillary: 142 mg/dL — ABNORMAL HIGH (ref 70–99)
Glucose-Capillary: 131 mg/dL — ABNORMAL HIGH (ref 70–99)
Glucose-Capillary: 156 mg/dL — ABNORMAL HIGH (ref 70–99)
Glucose-Capillary: 138 mg/dL — ABNORMAL HIGH (ref 70–99)
Glucose-Capillary: 120 mg/dL — ABNORMAL HIGH (ref 70–99)
Glucose-Capillary: 142 mg/dL — ABNORMAL HIGH (ref 70–99)

## 2022-06-19 LAB — CBC WITH DIFFERENTIAL/PLATELET
Abs Immature Granulocytes: 0.14 10*3/uL — ABNORMAL HIGH (ref 0.00–0.07)
Basophils Absolute: 0.1 10*3/uL (ref 0.0–0.1)
Basophils Relative: 0 %
Eosinophils Absolute: 0.1 10*3/uL (ref 0.0–0.5)
Eosinophils Relative: 1 %
HCT: 27.8 % — ABNORMAL LOW (ref 36.0–46.0)
Hemoglobin: 8.8 g/dL — ABNORMAL LOW (ref 12.0–15.0)
Immature Granulocytes: 1 %
Lymphocytes Relative: 11 %
Lymphs Abs: 1.6 10*3/uL (ref 0.7–4.0)
MCH: 36.5 pg — ABNORMAL HIGH (ref 26.0–34.0)
MCHC: 31.7 g/dL (ref 30.0–36.0)
MCV: 115.4 fL — ABNORMAL HIGH (ref 80.0–100.0)
Monocytes Absolute: 1.1 10*3/uL — ABNORMAL HIGH (ref 0.1–1.0)
Monocytes Relative: 7 %
Neutro Abs: 12 10*3/uL — ABNORMAL HIGH (ref 1.7–7.7)
Neutrophils Relative %: 80 %
Platelets: 242 10*3/uL (ref 150–400)
RBC: 2.41 MIL/uL — ABNORMAL LOW (ref 3.87–5.11)
RDW: 16.2 % — ABNORMAL HIGH (ref 11.5–15.5)
WBC: 15 10*3/uL — ABNORMAL HIGH (ref 4.0–10.5)
nRBC: 0 % (ref 0.0–0.2)

## 2022-06-19 LAB — CULTURE, RESPIRATORY W GRAM STAIN: Gram Stain: NONE SEEN

## 2022-06-19 LAB — COMPREHENSIVE METABOLIC PANEL
ALT: 20 U/L (ref 0–44)
AST: 42 U/L — ABNORMAL HIGH (ref 15–41)
Albumin: 3.3 g/dL — ABNORMAL LOW (ref 3.5–5.0)
Alkaline Phosphatase: 71 U/L (ref 38–126)
Anion gap: 17 — ABNORMAL HIGH (ref 5–15)
BUN: 59 mg/dL — ABNORMAL HIGH (ref 6–20)
CO2: 23 mmol/L (ref 22–32)
Calcium: 7.3 mg/dL — ABNORMAL LOW (ref 8.9–10.3)
Chloride: 102 mmol/L (ref 98–111)
Creatinine, Ser: 1.83 mg/dL — ABNORMAL HIGH (ref 0.44–1.00)
GFR, Estimated: 33 mL/min — ABNORMAL LOW (ref >60)
Glucose, Bld: 147 mg/dL — ABNORMAL HIGH (ref 70–99)
Potassium: 3.1 mmol/L — ABNORMAL LOW (ref 3.5–5.1)
Sodium: 142 mmol/L (ref 135–145)
Total Bilirubin: 4.5 mg/dL — ABNORMAL HIGH (ref 0.3–1.2)
Total Protein: 6.8 g/dL (ref 6.5–8.1)

## 2022-06-19 LAB — MAGNESIUM: Magnesium: 2.6 mg/dL — ABNORMAL HIGH (ref 1.7–2.4)

## 2022-06-19 LAB — POTASSIUM: Potassium: 3.4 mmol/L — ABNORMAL LOW (ref 3.5–5.1)

## 2022-06-19 MED ORDER — POTASSIUM CHLORIDE CRYS ER 20 MEQ PO TBCR: 40.0000 meq | EXTENDED_RELEASE_TABLET | Freq: Once | ORAL | Status: DC

## 2022-06-19 MED ORDER — POTASSIUM CHLORIDE 10 MEQ/50ML IV SOLN
10.0000 meq | INTRAVENOUS | Status: DC
  Administered 2022-06-19 (×2): 10 meq via INTRAVENOUS
  Filled 2022-06-19 (×2): qty 50

## 2022-06-19 MED ORDER — POTASSIUM CHLORIDE 10 MEQ/50ML IV SOLN
10.0000 meq | INTRAVENOUS | Status: DC
  Filled 2022-06-19: qty 50

## 2022-06-19 MED ORDER — POTASSIUM CHLORIDE 10 MEQ/50ML IV SOLN
10.0000 meq | INTRAVENOUS | Status: AC
  Administered 2022-06-19 – 2022-06-20 (×3): 10 meq via INTRAVENOUS
  Filled 2022-06-19 (×3): qty 50

## 2022-06-19 MED ORDER — ACETAMINOPHEN 160 MG/5ML PO SOLN
650.0000 mg | Freq: Four times a day (QID) | ORAL | Status: DC | PRN
  Administered 2022-06-19 – 2022-07-03 (×13): 650 mg
  Filled 2022-06-19 (×13): qty 20.3

## 2022-06-19 MED ORDER — GADOBUTROL 1 MMOL/ML IV SOLN
10.0000 mL | Freq: Once | INTRAVENOUS | Status: AC | PRN
  Administered 2022-06-19: 7.5 mL via INTRAVENOUS

## 2022-06-19 MED ORDER — POTASSIUM CHLORIDE 10 MEQ/50ML IV SOLN
10.0000 meq | INTRAVENOUS | Status: AC
  Administered 2022-06-19 (×2): 10 meq via INTRAVENOUS
  Filled 2022-06-19: qty 50

## 2022-06-19 MED ORDER — SODIUM CHLORIDE 0.9 % IV SOLN
2.0000 g | Freq: Two times a day (BID) | INTRAVENOUS | Status: DC
  Administered 2022-06-19 – 2022-06-21 (×6): 2 g via INTRAVENOUS
  Filled 2022-06-19 (×9): qty 40

## 2022-06-19 MED ORDER — POTASSIUM CHLORIDE 10 MEQ/50ML IV SOLN: 10.0000 meq | INTRAVENOUS | Status: DC

## 2022-06-19 MED ORDER — POTASSIUM CHLORIDE 20 MEQ PO PACK: 40.0000 meq | PACK | Freq: Once | ORAL | Status: DC

## 2022-06-19 NOTE — Progress Notes (Signed)
vLTM maintenance   All impedances below 10kohms.  No skin breakdown noted at all skin sites 

## 2022-06-19 NOTE — Progress Notes (Signed)
NAME:  Deanna Mcmillan, MRN:  [id number], DOB:  1971-04-19, LOS: 1 ADMISSION DATE:  06/02/2022, CONSULTATION DATE:  11/7 REFERRING MD:  Lendon Colonel, EDP CHIEF COMPLAINT:  AMS   History of Present Illness:  51 yo woman with hx of recent diagnosis of alcoholic hepatitis (dx 40/98), here with ams, fever.   History from her mother Deanna Mcmillan.  Patient was in her normal state of health, until suddenly the night PTA she developed a headache.  She went to sleep to rest.  The following day she slept throughout the day, that evening her mother went to wake her and was unable to wake her up, so she called 911.     She has been doing well since her recent hospital discharge, taking all the meds as prescribed, no new meds.  Planned follow up with GI on 11/14.  LE edema had been improving.  No cough, sob no focal symptoms.  No known sick contacts.     In ED, febrile, tachycardic, lethargic, desaturating to 70s.   Intubated by ED physician.    Had been admitted earlier in October and started on course of prenisone for alc hepatitis.    Recently seen at [facility] 10/19: LE Edema. Lasix not helpful at home.  Admitted for diuresis.  Jaundice and abd distension also noted.  Pleural effusion.  GI saw: non enough ascites for tap  Started lasix and spironolactone.  28 day course of prednisolone, miralax, lactulose, miralax. Midodrine TID    In ED got cefepime, LR 1L, flagyl ordered, intubated, started on Propofol, vanc ordered   Pertinent  Medical History  ETOH Cirrhosis BLE edema recently worsening  S/p lap appy 2014    Echo: recent normal 05/9146   Home meds: folic ascid, acetaminohpen, lasix 40bid, lactulose 30 TID protonix, prednisone (04/29/22), prednisolone 04/28/22 three week course, thiamine, simethicone,    Per her mother she had been drinking at least one bottle of wine daily, she isnt clear exactly how much she was drinking but she did also drink [place].  She notes she had wanted to stop recently but  been unable to.  She thinks she was depressed after experiencing perimenopause.  No tobacco.   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 11/18 more awake, following commands 1121 RUE PICC 11/22 extubated to BiPAP 11/23 - 14d of Rx complerted for MSSA/MESSE. For listeria - ID wants to extend amp/gent to 3 weeks 11/8-11/29. Ileus post extubation and NG to LIS . [place] increased. BP sofft an dlevophed and alb Rx.   11/24 -  OG placed yesterday for ileus and bile returns. TF on hold. Lactulose increased yesterday. REamins off vent. Did not need BiPAP. This AM more obtunded but mom thinks was awake at night and is sleeping due to fatigue. Afebrile.  But wBC up 13L. Neg baklance -3L:  OFF PRESSORs = [place] up at 72 - [place] mild periphepatic ascites Blood culture  11/25 - Now intubated due to obtunded encephaopathy. CTAbc with SBO v Ileus with transition ponit . CCS consult - no surgery but ok for rectal lactulose w ith continue NG/OG tube to LIS. ID ordered repeat blood culture. Linezolid added.  No ntrition since 06/14/22 Pm. Creat worsening. LEss Ur OP. On levophed 76mcg. Not on sedation. Vent 40% Ammonia 108 Trach aspirate 11/27 - Remains intubated. On propofol 16mcg and levophed 17mcg. Febrile up to 101.27F overnight. On ampicillin and linezolid, going for IR guided LP today. Kidney function  improving, repleting potassium. No seizure activity noted overnight on EEG. Surgery following for ileus, no need for surgical intervention. Holding TF for today.   Interim History / Subjective:   She was febrile x2 overnight with Tmax 101.71F. She was transitioned to meropenem yesterday per ID. Her IR guided LP revealed a lymphocytic pleocytosis, unclear significance. She has not had any seizure activity. Kidney function improving, nephro signed off. Discussed with ID and neuro, will obtain MRI brain and c-spine to assess for etiology of encephalopathy.   She remains unresponsive on my exam, which is concerning as she is off all sedation as of yesterday.  Her stool output is mainly composed of the lactulose enema itself instead of actual stool output.  Objective   Blood pressure (!) 124/56, pulse (!) 109, temperature 99.9 F (37.7 C), temperature source Axillary, resp. rate (!) 21, weight 63.7 kg, SpO2 97 %.    Vent Mode: PRVC FiO2 (%):  [40 %] 40 % Set Rate:  [20 bmp] 20 bmp Vt Set:  [430 mL] 430 mL PEEP:  [5 cmH20] 5 cmH20 Plateau Pressure:  [11 cmH20-39 cmH20] 39 cmH20   Intake/Output Summary (Last 24 hours) at 06/18/2022 1003 Last data filed at 06/18/2022 0941 Gross per 24 hour  Intake 3093.14 ml  Output 4250 ml  Net -1156.86 ml   Filed Weights   06/16/22 0545 06/17/22 0400 06/18/22 0355  Weight: 64.5 kg 64.5 kg 63.7 kg   General: critically ill-appearing middle aged female, on mech vent, NAD. HEENT: [place]/AT, scleral icterus present. G tube in place, ETT in place. CV: tachycardic rate and regular rhythm, no murmur noted. Pulm: ventilated breath sounds. GI: soft, mildly distended, hypoactive bowel sounds.  Skin: warm and dry Extremities: bruising on BUE. Neuro: sedated, RASS -4.   Resolved Hospital Problem list   Ileus Hypokalemia Hypophosphatemia Hypernatremia Thrombocytopenia  Assessment & Plan:   Hypotension/Circulatory shock -recurred 06/14/22, and again  06/15/22 Remains on levophed gtt, currently at 100mcg. Midodrine stopped given ileus. Doubtful that patient has HRS, will reduce MAP goal to >65 in hopes of decreasing pressor requirements.  -MAP goal >65, on pressor support -- wean as tolerated -holding midodrine due to ileus -holding lasix and aldactone given pressor need  Encephalopathy - hepatic and metabolic New Seizure (44/31) 2/2 hyperammonemia, listeria meningitis, uremia Encephalopathy worsened after seizure activity and correlated with worsening ammonia level. She is currently being treated for meningitis which could have precipitated seizure activity. Her uremia is improving and she is on lactulose therapy to help reduce ammonia levels. From an infection standpoint, treating with merrem per ID. Obtaining repeat MRI brain and c-spine today given worsening exam today. -rectal lactulose -on merrem -NPO -neuro and ID following, appreciate assistance -keppra -off of sedation, remains unresponsive -f/u MRI brain and c-spine -palliative care consult  Alcoholic cirrhosis - Recently diagnosed cirrhosis during last hospitalization. MELD Na score of 17. Child Pugh Class C. FIB-4 score of 7.46 indicates advance fibrosis. Peritoneal labs admit  not consistent with SBP, however, patient received abx prior to tap. MELD score 22 as of 11/25 (worsening kidney function and hyperbilirubinemia) - supportive care - consider GI consult if worsening   ILEUS - recurred 06/14/22  CT imaging 11/24 showing multiple dilated loops of small bowel with a transition zone in the mid to distal ileum consistent with a least partial small bowel obstruction. Per CCS, surgical intervention not indicated due to risk. Unclear if pSBO versus ileus. Repeat abdominal films x2 showing persistent SBO pattern. Her stool output is reflective of the lactulose therapy itself, not having much actual stool output. Per CCS, plan to trial clamping NGT and assess output. If tolerates,  can trial trickle feeds tomorrow.  - NPO - lactulose enema to promote BM - clamp NGT, if tolerates > consider starting trickle feeds tomorrow - appreciate CCS assistance   Listeria bacteremia and meningitis MSSA bacteremia  Klebsiella oxytoca on resp culture Blood cultures 11/24 > no growth for past 4 days. Trach aspirate growing MDR Klebsiella oxytoca. She has been on meropenem as of 11/27. -continue meropenem -appreciate ID recommendations -will need TEE once extubated  Acute hypoxic respiratory failure (CAP versus aspiration pneumonia and Compressive atelectasis ) - Extubated 11/22, reintubated 06/15/22 - 11/28 -- does not meet criteria for SBT/Extubation in setting of Acute Respiratory Failure due to shock, AKI, encephalopathy Plan - Oral care - HOB > 30 - PRVC - No SBT  AKI (Baseline 0.6- 0.9) - improving Her kidney function is improving daily. Cr down to 1.83 from 2.08. Nephrology has signed off given improvement. - MAP goal >= 65 - avoid nephrotoxic meds as able - monitor UOP  Hx SVT Prolonged Qtc,  -532msec 06/04/22;   513 msec 06/17/22 - monitor - avoid Qtc prolongation drugs  Anemia - chronic (baseline 8gm-9gm% in oct 2023)- Present on Admit -appears to be at baseline, no active bleeding noted -transfuse if hgb <7 and/or hemodynamic instability  Protein caloric malnutrition, mild 11/28 - TF remain on hold given ileus. - hold off TPN, may need to consider tomorrow if no improvement in ileus - D5w for now kvo  Hyperglycemia, controlled -continue SSI prn -goal BG 140-180  Severe physical deconditioning - She will need aggressive PT/OT (likely CIR) for strength training.   Best Practice (right click and "Reselect all SmartList Selections" daily)   Diet/type: tubefeeds - on hold since 06/14/22 due to ileus ->  DVT prophylaxis: prophylactic heparin  GI prophylaxis: PPI Lines: Central line - rUE PICC since 06/12/22 Foley:  Yes, and it is still needed Code  Status:  full code Last date of multidisciplinary goals of care discussion [updated mother at bedside 11/22 and 06/14/21 and 06/15/22, 06/16/22 at bedside    Interdisciplinary Goals of Care Family Meeting   Date carried out:: 06/19/2022  Location of the meeting: Bedside  Member's involved: Physician, Bedside Registered Nurse, Family Member or next of kin, and Other: Mom  Durable Power of Tour manager: mom    Discussion: We discussed goals of care for [employer].  MODS  Code status: Full Code  Disposition: Continue current acute care    Time spent for the meeting: 10 min       Virl Axe, MD 06/18/2022, 10:04 AM    LABS    PULMONARY Recent Labs  Lab 06/15/22 1037 06/15/22 2107 06/15/22 2238  PHART 7.557* 7.564* 7.502*  PCO2ART 35.2 38.2 43.8  PO2ART 67* 91 69*  HCO3 31.3* 34.7* 34.5*  TCO2  32 36* 36*  O2SAT 95 98 95    CBC Recent Labs  Lab 06/15/22 0403 06/15/22 1037 06/15/22 2238 06/16/22 0335 06/17/22 0253  HGB 10.5*   < > 10.9* 10.2* 9.8*  HCT 32.6*   < > 32.0* 32.7* 31.1*  WBC 13.1*  --   --  20.6* 23.5*  PLT 277  --   --  361 324   < > = values in this interval not displayed.    COAGULATION Recent Labs  Lab 06/16/22 0335  INR 1.4*    CHEMISTRY Recent Labs  Lab 06/14/22 0306 06/15/22 0403 06/15/22 1037 06/15/22 2052 06/15/22 2107 06/16/22 0335 06/16/22 0826 06/17/22 0253 06/18/22 0338 06/18/22 0527  NA 151* 153*   < > 150*   < > 148* 143 141 137 140  K 3.3* 4.5   < > 4.6   < > 4.7 3.8 5.3* 2.7* 2.5*  CL 110 109  --  104  --  102 98 94* 94* 96*  CO2 26 27  --  31  --  31 25 26 24 24   GLUCOSE 167* 170*  --  129*  --  144* 208* 129* 144* 135*  BUN 51* 57*  --  70*  --  76* 68* 74* 71* 67*  CREATININE 0.59 0.89  --  1.88*  --  2.30* 2.24* 2.61* 2.19* 2.08*  CALCIUM 9.4 9.9  --  9.3  --  9.1 7.8* 8.3* 7.4* 7.4*  MG 2.6* 2.6*  --  3.0*  --  2.7*  --  2.8*  --   --   PHOS  --  3.6  --    --   --  6.1*  --  8.1*  --   --    < > = values in this interval not displayed.   Estimated Creatinine Clearance: 27.3 mL/min (A) (by C-G formula based on SCr of 2.08 mg/dL (H)).   LIVER Recent Labs  Lab 06/14/22 0306 06/15/22 0403 06/16/22 0335 06/17/22 0253 06/18/22 0338  AST 49* 57* 62* 60* 51*  ALT 17 21 21 21 20   ALKPHOS 105 112 108 107 78  BILITOT 2.2* 2.7* 2.8* 3.3* 3.4*  PROT 6.8 7.2 7.1 7.4 6.5  ALBUMIN 2.7* 3.0* 2.9* 3.8 3.3*  INR  --   --  1.4*  --   --      INFECTIOUS Recent Labs  Lab 06/15/22 1054 06/16/22 0335 06/16/22 0826 06/17/22 0253 06/17/22 0514  LATICACIDVEN 1.7 2.1* 1.9 6.8* 1.8  PROCALCITON 0.55 1.63  --  0.78  --      ENDOCRINE CBG (last 3)  Recent Labs    06/17/22 2310 06/18/22 0335 06/18/22 0731  GLUCAP 200* 149* 131*         IMAGING x48h  - image(s) personally visualized  -   highlighted in bold DG Abd Portable 1V  Result Date: 06/18/2022 CLINICAL DATA:  Small-bowel obstruction EXAM: PORTABLE ABDOMEN - 1 VIEW COMPARISON:  06/17/2022 FINDINGS: Nasogastric tube tip in the antrum of the stomach. Soft feeding tube tip is now in the region of the pylorus, previously in the descending duodenum. Persistent small bowel obstruction pattern, probably slightly worsened radiographically. Small bowel dilatation measures up to 4.6 cm. IMPRESSION: 1. Nasogastric tube tip in the antrum of the stomach. Soft feeding tube tip now in the region of the pylorus, previously in the descending duodenum. 2. Persistent small bowel obstruction pattern, probably slightly worsened. Electronically Signed   By: Jan Fireman.D.  On: 06/18/2022 08:02   MR BRAIN WO CONTRAST  Result Date: 06/17/2022 CLINICAL DATA:  Seizure EXAM: MRI HEAD WITHOUT CONTRAST TECHNIQUE: Multiplanar, multiecho pulse sequences of the brain and surrounding structures were obtained without intravenous contrast. COMPARISON:  CT head obtained the same day and CT head 06/12/2022 FINDINGS:  Brain: There is no acute intracranial hemorrhage, extra-axial fluid collection, or acute infarct. Areas of sulcal FLAIR hyperintensity may be related to intubation/oxygenation given normal same-day head CT. Parenchymal volume is normal. The ventricles are normal in size. Gray-white differentiation is preserved. There is mild FLAIR signal abnormality in the subcortical and periventricular white matter which is nonspecific but may reflect sequela of mild chronic small-vessel ischemic change. The hippocampi are normal in signal and architecture. There is no mass lesion.  There is no mass effect or midline shift. Vascular: Normal flow voids. Skull and upper cervical spine: Normal marrow signal. Sinuses/Orbits: The paranasal sinuses are clear. The globes and orbits are unremarkable. Other: There are bilateral mastoid effusions which may be related to instrumentation. IMPRESSION: No acute intracranial pathology or epileptogenic focus identified. Electronically Signed   By: Valetta Mole M.D.   On: 06/17/2022 17:57   DG Abd Portable 1V-Small Bowel Obstruction Protocol-24 hr delay  Result Date: 06/17/2022 CLINICAL DATA:  Nasogastric tube placement. EXAM: PORTABLE ABDOMEN - 1 VIEW COMPARISON:  June 16, 2022. FINDINGS: Distal tip of nasogastric tube is seen in expected position of distal stomach. Distal tip of feeding tube is seen in expected position of proximal duodenum. Continued small bowel dilatation is noted. IMPRESSION: Stable position of nasogastric and feeding tubes. Grossly stable small bowel dilatation. Electronically Signed   By: Marijo Conception M.D.   On: 06/17/2022 08:33   Overnight EEG with video  Result Date: 06/17/2022 Lora Havens, MD     06/18/2022  9:16 AM Patient Name: Deanna Mcmillan MRN: [id number] Epilepsy Attending: Lora Havens Referring Physician/Provider: Donnetta Simpers, MD Duration: 06/17/2022 2595 to 06/18/2022 6387 Patient history: 51yo F with GTC seizure. EEG to  evaluate for seizure. Level of alertness: comatose AEDs during EEG study: LEV, Propofol Technical aspects: This EEG study was done with scalp electrodes positioned according to the 10-20 International system of electrode placement. Electrical activity was reviewed with band pass filter of 1-70Hz , sensitivity of 7 uV/mm, display speed of 33mm/sec with a 60Hz  notched filter applied as appropriate. EEG data were recorded continuously and digitally stored.  Video monitoring was available and reviewed as appropriate. Description: EEG showed burst suppression pattern with burst of 5-7hz  theta slowing lasting 1 seconds alternating with 2-5 seconds of generalized suppression. Hyperventilation and photic stimulation were not performed.   Of note, study was technically difficult to interpret due to significant electrode artifact. ABNORMALITY - Burst suppression, generalized IMPRESSION: This technically difficult study is suggestive of profound diffuse encephalopathy, nonspecific etiology but likely related to sedation. No seizures or epileptiform discharges were seen throughout the recording. Priyanka Barbra Sarks   CT HEAD WO CONTRAST (5MM)  Result Date: 06/17/2022 CLINICAL DATA:  Seizure, new onset. Meningitis/CNS infection suspected. Mental status change, unknown cause. EXAM: CT HEAD WITHOUT CONTRAST TECHNIQUE: Contiguous axial images were obtained from the base of the skull through the vertex without intravenous contrast. RADIATION DOSE REDUCTION: This exam was performed according to the departmental dose-optimization program which includes automated exposure control, adjustment of the mA and/or kV according to patient size and/or use of iterative reconstruction technique. COMPARISON:  05/24/2022. FINDINGS: Brain: No acute intracranial hemorrhage, midline shift or mass  effect. No extra-axial fluid collection. Mild atrophy is noted. Periventricular white matter hypodensities are present bilaterally. No hydrocephalus.  Vascular: No hyperdense vessel or unexpected calcification. Skull: Normal. Negative for fracture or focal lesion. Sinuses/Orbits: No acute finding. Other: Tubes are present in the nasopharynx. A few opacities are noted in the mastoid air cells on the right. IMPRESSION: 1. No acute intracranial hemorrhage. 2. Atrophy with chronic microvascular ischemic changes. Electronically Signed   By: Brett Fairy M.D.   On: 06/17/2022 04:55   DG Abd 1 View  Result Date: 06/16/2022 CLINICAL DATA:  Evaluate NG tube placement EXAM: ABDOMEN - 1 VIEW COMPARISON:  June 16, 2022 FINDINGS: The NG tube terminates in the distal stomach. The feeding tube terminates in the distal second portion of the duodenum. Continued small bowel obstruction with dilated loops of small bowel. IMPRESSION: 1. The NG tube terminates in the distal stomach. The feeding tube terminates in the distal second portion of the duodenum. 2. Continued small bowel obstruction. Electronically Signed   By: Dorise Bullion III M.D.   On: 06/16/2022 12:57   DG Abd Portable 1V-Small Bowel Obstruction Protocol-initial, 8 hr delay  Result Date: 06/16/2022 CLINICAL DATA:  Small bowel obstruction. EXAM: PORTABLE ABDOMEN - 1 VIEW COMPARISON:  KUB June 15, 2022. CT the abdomen and pelvis June 15, 2022. FINDINGS: The feeding tube is been advanced and appears to terminate in the distal second portion of the duodenum. The NG tube terminates in the stomach. Dilated loops of small bowel are identified throughout the abdomen consistent with on going small-bowel obstruction. IMPRESSION: 1. Support apparatus as above. 2. Continued small bowel obstruction. Electronically Signed   By: Dorise Bullion III M.D.   On: 06/16/2022 11:52

## 2022-06-19 NOTE — Progress Notes (Signed)
Same day note  MRI brain and C-spine personally reviewed, agree with radiology: 1. Negative for acute infarct or mass. 2. Diffuse pachymeningeal enhancement and mild thickening. Sulcal hyperintensity on FLAIR has progressed in the interval. Mild pituitary enlargement for age unchanged. The mamillopontine distance is narrowed at 4.3 mm. Findings can be seen with intracranial hypotension however some of these findings can also be seen with meningitis. 3. No evidence of spinal infection in the cervical spine. Cervical spondylosis causing foraminal narrowing as described above.  Appreciate ID following as well.  Notably the FLAIR hyperintensity can also be an artifactual finding in the setting of intubation although I do agree it appears to have progressed from prior scan, which coupled with worsening CSF pleocytosis is concerning for possible progressive infectious/inflammatory process.  Will rely on serial examination tomorrow to try to distinguish worsening CSF process from expected post-LP related findings  Lesleigh Noe MD-PhD [facility] [phone] Available 7 AM to 7 PM, outside these hours please contact Neurologist on call listed on AMION

## 2022-06-19 NOTE — Progress Notes (Signed)
Subjective/Chief Complaint: intubated, sedated, output in flexi-seal mostly likely has been secondary to lactulose enemas recently.  Unclear how much bowel function she is actually having.  NGT output is much decreased though.   Objective: Vital signs in last 24 hours: Temp:  [98.7 F (37.1 C)-101.1 F (38.4 C)] 98.7 F (37.1 C) (11/28 0747) Pulse Rate:  [107-121] 107 (11/28 0800) Resp:  [16-36] 36 (11/28 0800) BP: (106-146)/(53-75) 132/56 (11/28 0800) SpO2:  [91 %-100 %] 98 % (11/28 0800) FiO2 (%):  [40 %] 40 % (11/28 0400) Weight:  [67.1 kg] 67.1 kg (11/28 0500) Last BM Date : 06/19/22  Intake/Output from previous day: 11/27 0701 - 11/28 0700 In: 2772 [I.V.:649; NG/GT:90; IV Piggyback:1133] Out: 2800 [Urine:1500; Emesis/NG output:500; Stool:800] Intake/Output this shift: Total I/O In: 136.4 [I.V.:26.5; NG/GT:60; IV Piggyback:49.9] Out: 200 [Emesis/NG output:200]  Abdomen: soft, seems only slightly distended.  NGT with 200cc out so far overnight and today.    Lab Results:  Recent Labs    06/18/22 1531 06/19/22 0402  WBC 16.5* 15.0*  HGB 8.7* 8.8*  HCT 26.7* 27.8*  PLT 255 242   BMET Recent Labs    06/18/22 0527 06/18/22 0954 06/18/22 1531 06/19/22 0402  NA 140  --   --  142  K 2.5*   < > 3.1* 3.1*  CL 96*  --   --  102  CO2 24  --   --  23  GLUCOSE 135*  --   --  147*  BUN 67*  --   --  59*  CREATININE 2.08*  --   --  1.83*  CALCIUM 7.4*  --   --  7.3*   < > = values in this interval not displayed.   PT/INR No results for input(s): "LABPROT", "INR" in the last 72 hours.  ABG No results for input(s): "PHART", "HCO3" in the last 72 hours.  Invalid input(s): "PCO2", "PO2"   Studies/Results: DG FL GUIDED LUMBAR PUNCTURE  Result Date: 06/18/2022 CLINICAL DATA:  Listeria meningitis, encephalopathy EXAM: DIAGNOSTIC LUMBAR PUNCTURE UNDER FLUOROSCOPIC GUIDANCE COMPARISON:  06/01/22. FLUOROSCOPY: Radiation Exposure Index (as provided by the  fluoroscopic device): 2.40 mGy Kerma PROCEDURE: Informed consent was obtained from the patient's mother prior to the procedure, including potential complications of headache, allergy, and pain. With the patient prone, the lower back was prepped with Betadine. 1% Lidocaine was used for local anesthesia. Lumbar puncture was performed at the L3-L4 level using a 20g gauge needle with return of yellow, clear CSF with an opening pressure of 25 cm water. 15 ml of CSF were obtained for laboratory studies. Closing pressure was measured at 19 cm water. The patient tolerated the procedure well and there were no apparent complications. IMPRESSION: Successful lumbar puncture as described above. This exam was performed by Pasty Spillers, PA-C, and was supervised and interpreted by Lajean Manes, MD. Electronically Signed   By: Lajean Manes M.D.   On: 06/18/2022 11:15   DG Abd Portable 1V  Result Date: 06/18/2022 CLINICAL DATA:  Small-bowel obstruction EXAM: PORTABLE ABDOMEN - 1 VIEW COMPARISON:  06/17/2022 FINDINGS: Nasogastric tube tip in the antrum of the stomach. Soft feeding tube tip is now in the region of the pylorus, previously in the descending duodenum. Persistent small bowel obstruction pattern, probably slightly worsened radiographically. Small bowel dilatation measures up to 4.6 cm. IMPRESSION: 1. Nasogastric tube tip in the antrum of the stomach. Soft feeding tube tip now in the region of the pylorus, previously in the descending duodenum.  2. Persistent small bowel obstruction pattern, probably slightly worsened. Electronically Signed   By: Nelson Chimes M.D.   On: 06/18/2022 08:02   MR BRAIN WO CONTRAST  Result Date: 06/17/2022 CLINICAL DATA:  Seizure EXAM: MRI HEAD WITHOUT CONTRAST TECHNIQUE: Multiplanar, multiecho pulse sequences of the brain and surrounding structures were obtained without intravenous contrast. COMPARISON:  CT head obtained the same day and CT head 05/30/2022 FINDINGS: Brain: There is  no acute intracranial hemorrhage, extra-axial fluid collection, or acute infarct. Areas of sulcal FLAIR hyperintensity may be related to intubation/oxygenation given normal same-day head CT. Parenchymal volume is normal. The ventricles are normal in size. Gray-white differentiation is preserved. There is mild FLAIR signal abnormality in the subcortical and periventricular white matter which is nonspecific but may reflect sequela of mild chronic small-vessel ischemic change. The hippocampi are normal in signal and architecture. There is no mass lesion.  There is no mass effect or midline shift. Vascular: Normal flow voids. Skull and upper cervical spine: Normal marrow signal. Sinuses/Orbits: The paranasal sinuses are clear. The globes and orbits are unremarkable. Other: There are bilateral mastoid effusions which may be related to instrumentation. IMPRESSION: No acute intracranial pathology or epileptogenic focus identified. Electronically Signed   By: Valetta Mole M.D.   On: 06/17/2022 17:57    Anti-infectives: Anti-infectives (From admission, onward)    Start     Dose/Rate Route Frequency Ordered Stop   06/20/22 0600  ceFAZolin (ANCEF) IVPB 2g/100 mL premix  Status:  Discontinued        2 g 200 mL/hr over 30 Minutes Intravenous Every 8 hours 06/15/22 1343 06/18/22 1632   06/19/22 1000  meropenem (MERREM) 2 g in sodium chloride 0.9 % 100 mL IVPB        2 g 280 mL/hr over 30 Minutes Intravenous Every 12 hours 06/19/22 0852     06/18/22 2000  meropenem (MERREM) 1 g in sodium chloride 0.9 % 100 mL IVPB  Status:  Discontinued        1 g 200 mL/hr over 30 Minutes Intravenous Every 12 hours 06/18/22 1632 06/19/22 0852   06/17/22 1400  ampicillin (OMNIPEN) 2 g in sodium chloride 0.9 % 100 mL IVPB  Status:  Discontinued        2 g 300 mL/hr over 20 Minutes Intravenous Every 8 hours 06/17/22 0731 06/18/22 1632   06/17/22 1100  rifaximin (XIFAXAN) tablet 550 mg        550 mg Per Tube 2 times daily 06/17/22  1000     06/16/22 1200  ampicillin (OMNIPEN) 2 g in sodium chloride 0.9 % 100 mL IVPB  Status:  Discontinued        2 g 300 mL/hr over 20 Minutes Intravenous Every 6 hours 06/16/22 0813 06/17/22 0731   06/15/22 1430  linezolid (ZYVOX) IVPB 600 mg  Status:  Discontinued        600 mg 300 mL/hr over 60 Minutes Intravenous Every 12 hours 06/15/22 1339 06/18/22 1632   06/12/22 2000  gentamicin (GARAMYCIN) IVPB 60 mg        60 mg 100 mL/hr over 30 Minutes Intravenous Every 24 hours 06/12/22 1146 06/15/22 2209   06/11/22 1230  rifaximin (XIFAXAN) tablet 550 mg  Status:  Discontinued        550 mg Per Tube 2 times daily 06/11/22 1135 06/16/22 0803   06/08/22 1845  gentamicin (GARAMYCIN) 20 mg in dextrose 5 % 50 mL IVPB  Status:  Discontinued  20 mg 101 mL/hr over 30 Minutes Intravenous Every 12 hours 06/08/22 0753 06/12/22 1146   06/05/22 1845  gentamicin (GARAMYCIN) 40 mg in dextrose 5 % 50 mL IVPB  Status:  Discontinued        40 mg 102 mL/hr over 30 Minutes Intravenous Every 12 hours 06/05/22 0801 06/08/22 0753   06/02/22 1515  ceFAZolin (ANCEF) IVPB 2g/100 mL premix        2 g 200 mL/hr over 30 Minutes Intravenous Every 8 hours 06/02/22 1428 06/13/22 2154   06/01/22 1842  gentamicin (GARAMYCIN) IVPB 80 mg  Status:  Discontinued        80 mg 100 mL/hr over 30 Minutes Intravenous Every 12 hours 06/01/22 0800 06/05/22 0801   06/01/22 1205  vancomycin (VANCOCIN) IVPB 1000 mg/200 mL premix  Status:  Discontinued        1,000 mg 200 mL/hr over 60 Minutes Intravenous Every 12 hours 06/01/22 0839 06/02/22 1428   05/30/22 2300  vancomycin (VANCOREADY) IVPB 1250 mg/250 mL  Status:  Discontinued        1,250 mg 166.7 mL/hr over 90 Minutes Intravenous Every 24 hours 05/30/2022 2313 06/01/22 0839   05/30/22 2000  cefTRIAXone (ROCEPHIN) 2 g in sodium chloride 0.9 % 100 mL IVPB  Status:  Discontinued        2 g 200 mL/hr over 30 Minutes Intravenous Every 24 hours 05/30/22 1417 05/30/22 1443    05/30/22 2000  cefTRIAXone (ROCEPHIN) 2 g in sodium chloride 0.9 % 100 mL IVPB  Status:  Discontinued        2 g 200 mL/hr over 30 Minutes Intravenous Every 12 hours 05/30/22 1443 06/02/22 1428   05/30/22 1800  metroNIDAZOLE (FLAGYL) IVPB 500 mg  Status:  Discontinued        500 mg 100 mL/hr over 60 Minutes Intravenous Every 12 hours 05/30/22 1726 05/31/22 1155   05/30/22 1500  gentamicin (GARAMYCIN) 130 mg in dextrose 5 % 50 mL IVPB  Status:  Discontinued        5 mg/kg/day  77.1 kg 106.5 mL/hr over 30 Minutes Intravenous Every 8 hours 05/30/22 1359 06/01/22 0800   05/30/22 1330  ampicillin (OMNIPEN) 2 g in sodium chloride 0.9 % 100 mL IVPB  Status:  Discontinued        2 g 300 mL/hr over 20 Minutes Intravenous Every 4 hours 05/30/22 1238 06/16/22 0813   05/30/22 0500  ceFEPIme (MAXIPIME) 2 g in sodium chloride 0.9 % 100 mL IVPB  Status:  Discontinued        2 g 200 mL/hr over 30 Minutes Intravenous Every 8 hours 05/30/2022 2313 05/30/22 1417   06/04/2022 2000  vancomycin (VANCOREADY) IVPB 1750 mg/350 mL        1,750 mg 175 mL/hr over 120 Minutes Intravenous  Once 06/12/2022 1952 05/30/22 0128   06/20/2022 2000  ceFEPIme (MAXIPIME) 2 g in sodium chloride 0.9 % 100 mL IVPB  Status:  Discontinued        2 g 200 mL/hr over 30 Minutes Intravenous  Once 06/13/2022 1952 06/16/2022 2003   06/05/2022 1945  ceFEPIme (MAXIPIME) 2 g in sodium chloride 0.9 % 100 mL IVPB        2 g 200 mL/hr over 30 Minutes Intravenous  Once 05/26/2022 1938 06/07/2022 2126   06/09/2022 1945  metroNIDAZOLE (FLAGYL) IVPB 500 mg        500 mg 100 mL/hr over 60 Minutes Intravenous  Once 05/27/2022 1938 06/18/2022 2322  06/21/2022 1945  vancomycin (VANCOCIN) IVPB 1000 mg/200 mL premix  Status:  Discontinued        1,000 mg 200 mL/hr over 60 Minutes Intravenous  Once 05/31/2022 1938 06/17/2022 2003       Assessment/Plan: pSBO vs ileus -only has appy before  -difficult to determine what type of bowel function she is having given a lot  flexi-seal output is from enemas. -NGT output has decreased and abdomen seems softer today -will try NGT clamping trial today.  If she tolerates can hopefully start TFs tomorrow -check plain film as well. -d/w primary service.  FEN - NPO/NGT, clamp/IVFs VTE - heparin ID - ancef, ampicillin, zyvox  Henreitta Cea 06/19/2022

## 2022-06-19 NOTE — Progress Notes (Signed)
eLink Physician-Brief Progress Note Patient Name: Deanna Mcmillan DOB: August 11, 1970 MRN: [id number]   Date of Service  06/19/2022  HPI/Events of Note  Temp up and down, currently 101.3 with no PRNs, on vent with OG and FT, has flexi seal,  RN replacing ice packs,  K+ 3.1 with just today Cr 1.83 and GFR 33  Recent cultures NGTD On meropenem  eICU Interventions  Ordered Tylenol prn Electrolyte to be replaced using the electrolyte replacement protocol as kidney function continues to improve     Intervention Category Intermediate Interventions: Other:  Judd Lien 06/19/2022, 5:26 AM

## 2022-06-19 NOTE — Procedures (Addendum)
Patient Name: Deanna Mcmillan  MRN: [id number]  Epilepsy Attending: Lora Havens  Referring Physician/Provider: Donnetta Simpers, MD  Duration: 06/19/2022 0513 to 06/19/2022 1041   Patient history: 51yo F with GTC seizure. EEG to evaluate for seizure.   Level of alertness: comatose   AEDs during EEG study: LEV, Propofol   Technical aspects: This EEG study was done with scalp electrodes positioned according to the 10-20 International system of electrode placement. Electrical activity was reviewed with band pass filter of 1-70Hz , sensitivity of 7 uV/mm, display speed of 42mm/sec with a 60Hz  notched filter applied as appropriate. EEG data were recorded continuously and digitally stored.  Video monitoring was available and reviewed as appropriate.   Description: EEG showed continuous 3-6 hz theta-delta slowing admixed with intermittent generalized 12 to 14 Hz beta activity. Hyperventilation and photic stimulation were not performed.      ABNORMALITY -Continuous slow, generalized   IMPRESSION: This study is suggestive of severe diffuse encephalopathy, nonspecific etiology but likely related to sedation. No seizures or epileptiform discharges were seen throughout the recording.   Deanna Mcmillan

## 2022-06-19 NOTE — Progress Notes (Signed)
[facility] ADULT ICU REPLACEMENT PROTOCOL   The patient does apply for the [facility] Adult ICU Electrolyte Replacment Protocol based on the criteria listed below:   1.Exclusion criteria: TCTS, ECMO, Dialysis, and Myasthenia Gravis patients 2. Is GFR >/= 30 ml/min? Yes.    Patient's GFR today is 33 3. Is SCr </= 2? Yes.   Patient's SCr is 1.83 mg/dL 4. Did SCr increase >/= 0.5 in 24 hours? No. 5.Pt's weight >40kg  Yes.   6. Abnormal electrolyte(s): K+3.1  7. Electrolytes replaced per protocol 8.  Call MD STAT for K+ </= 2.5, Phos </= 1, or Mag </= 1 Physician:  Dr. Nicki Gu[place], Talbot Grumbling 06/19/2022 5:32 AM

## 2022-06-19 NOTE — Progress Notes (Signed)
PHARMACY NOTE:  ANTIMICROBIAL RENAL DOSAGE ADJUSTMENT  Current antimicrobial regimen includes a mismatch between antimicrobial dosage and estimated renal function.  As per policy approved by the Pharmacy & Therapeutics and Medical Executive Committees, the antimicrobial dosage will be adjusted accordingly.  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 []      On intermittent HD, scheduled: []      On CRRT    Antimicrobial dosage has been changed to:  Meropenem 2 gm Q 12 hours   Additional comments: Renal function slightly improved today    Thank you for allowing pharmacy to be a part of this patient's care.  Jimmy Footman, PharmD, BCPS, BCIDP Infectious Diseases Clinical Pharmacist Phone: [phone] 06/19/2022 8:53 AM

## 2022-06-19 NOTE — Progress Notes (Signed)
[facility]    Date of Admission:  06/12/2022   Total days of antibiotics 21   ID: Deanna Mcmillan is a 51 y.o. 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 Having ongoing fevers Principal Problem:   Acute respiratory failure with hypoxia (HCC) Active Problems:   Pressure injury of skin   Toxic metabolic encephalopathy   On mechanically assisted ventilation (HCC)   Sepsis with acute hypoxic respiratory failure (HCC)   Listeriosis   Acute pulmonary edema (HCC)   Septic shock (HCC)   Compromised airway   MSSA bacteremia   Small bowel obstruction (HCC)    Subjective: Still having fevers, remains encephalopathic, intubated, requiring higher pressors doses for MAP > 80  WBC trending down,   Medications:   Chlorhexidine Gluconate Cloth  6 each Topical Daily   folic acid  1 mg Intravenous Daily   heparin  5,000 Units Subcutaneous Q8H   insulin aspart  0-15 Units Subcutaneous Q4H   lactulose  300 mL Rectal Daily   leptospermum manuka honey  1 Application Topical Daily   mouth rinse  15 mL Mouth Rinse Q2H   pantoprazole (PROTONIX) IV  40 mg Intravenous Q24H   rifaximin  550 mg Per Tube BID   sodium chloride flush  10-40 mL Intracatheter Q12H   thiamine (VITAMIN B1) injection  100 mg Intravenous Daily    Objective: Vital signs in last 24 hours: Temp:  [98.1 F (36.7 C)-101.1 F (38.4 C)] 98.1 F (36.7 C) (11/28 1548) Pulse Rate:  [101-121] 112 (11/28 1630) Resp:  [16-36] 24 (11/28 1630) BP: (106-140)/(43-83) 113/46 (11/28 1630) SpO2:  [70 %-100 %] 98 % (11/28 1630) FiO2 (%):  [40 %] 40 % (11/28 1516) Weight:  [67.1 kg] 67.1 kg (11/28 0500)  Physical Exam  Constitutional:  appears well-developed and well-nourished. No distress.  HENT: [place]/AT, conjunctiva edema, scleral icterus; EEG leads on scalp Mouth/Throat: OETT in place Cardiovascular: Normal  rate, regular rhythm and normal heart sounds. Exam reveals no gallop and no friction rub.  No murmur heard.  Pulmonary/Chest: Effort normal and breath sounds normal. No respiratory distress.  has no wheezes.  Neck = supple, no nuchal rigidity Abdominal: Soft. Bowel sounds are decreased.  exhibits no distension.  Skin: Skin is warm and dry. No rash noted. No erythema.    Lab Results Recent Labs    06/18/22 0527 06/18/22 0954 06/18/22 1531 06/19/22 0402  WBC  --   --  16.5* 15.0*  HGB  --   --  8.7* 8.8*  HCT  --   --  26.7* 27.8*  NA 140  --   --  142  K 2.5*   < > 3.1* 3.1*  CL 96*  --   --  102  CO2 24  --   --  23  BUN 67*  --   --  59*  CREATININE 2.08*  --   --  1.83*   < > = values in this interval not displayed.   Liver Panel Recent Labs    06/18/22 0338 06/19/22 0402  PROT 6.5 6.8  ALBUMIN 3.3* 3.3*  AST 51* 42*  ALT 20 20  ALKPHOS 78 71  BILITOT 3.4* 4.5*   Sedimentation Rate No results for input(s): "ESRSEDRATE" in the last 72 hours. C-Reactive Protein No results for input(s): "CRP" in the last 72 hours.  Microbiology: reviewed Studies/Results: DG CHEST PORT  1 VIEW  Result Date: 06/19/2022 CLINICAL DATA:  Hypoxia EXAM: PORTABLE CHEST 1 VIEW COMPARISON:  06/15/2022 FINDINGS: Endotracheal tube seen 3.7 cm above the carina, withdrawn since prior examination. Nasogastric tube and nasoenteric feeding tube extending into the upper abdomen beyond the margin of the examination. Right upper extremity PICC line tip noted within the superior right atrium. Lung volumes are small, but are stable since prior examination. Partial right lower lobe collapse noted. Left perihilar pulmonary infiltrate appears stable, asymmetric pulmonary edema versus infection. No pneumothorax or pleural effusion. Cardiac size within normal limits. IMPRESSION: 1. Endotracheal tube slightly withdrawn since prior examination, now 3.7 cm above the carina. Otherwise stable support lines and tubes.  2. Stable pulmonary hypoinflation with right lower lobe collapse. 3. Stable left perihilar pulmonary infiltrate, edema versus infection. Electronically Signed   By: Fidela Salisbury M.D.   On: 06/19/2022 15:23   MR BRAIN W WO CONTRAST  Result Date: 06/19/2022 CLINICAL DATA:  Altered mental status. Encephalopathy. Lumbar puncture of 06/18/2022. Rule out CSF leak/spontaneous intracranial hypotension EXAM: MRI HEAD WITHOUT AND WITH CONTRAST MRI CERVICAL SPINE WITHOUT AND WITH CONTRAST TECHNIQUE: Multiplanar, multiecho pulse sequences of the brain and surrounding structures, and cervical spine, to include the craniocervical junction and cervicothoracic junction, were obtained without and with intravenous contrast. CONTRAST:  7.82mL GADAVIST GADOBUTROL 1 MMOL/ML IV SOLN COMPARISON:  MRI head 06/17/2022 FINDINGS: MRI HEAD FINDINGS Brain: Negative for acute infarct or mass. No acute hemorrhage or fluid collection. Pituitary mildly enlarged for age measuring 8 mm in height. This is unchanged. Pituitary enhances homogeneously. There is hyperintensity in the sulci of FLAIR imaging which has progressed in the interval. Ventricles remain mildly enlarged and unchanged. Following contrast infusion, there is diffuse pack E meningeal enhancement and mild thickening. The mamillopontine distance is 4.3 mm which is narrowed. These findings can be seen with intracranial hypotension however can be also seen with meningitis. Vascular: Normal arterial flow voids.  Normal venous enhancement Skull and upper cervical spine: No focal skeletal lesion. Sinuses/Orbits: Paranasal sinuses clear. Mild mastoid effusion bilaterally. Negative orbit Other: None MRI CERVICAL SPINE FINDINGS Alignment: Mild anterolisthesis C2-3. Mild retrolisthesis C5-6. 3 mm retrolisthesis C6-7 Vertebrae: Negative for fracture or mass. No evidence of spinal infection or abscess. Cord: Normal signal and morphology Posterior Fossa, vertebral arteries, paraspinal tissues:  The patient is intubated. NG tube in feeding tube in the esophagus. No mass, adenopathy, or fluid collection in the neck. Disc levels: C2-3: Negative C3-4: Disc degeneration with asymmetric spurring on the left. Moderate left foraminal narrowing. C4-5: Disc degeneration with mild uncinate spurring. Mild left foraminal narrowing C5-6: Disc degeneration with uncinate spurring. Moderate right foraminal narrowing and mild left foraminal narrowing C6-7: Mild disc degeneration and spurring without significant stenosis C7-T1 height negative IMPRESSION: 1. Negative for acute infarct or mass. 2. Diffuse pachymeningeal enhancement and mild thickening. Sulcal hyperintensity on FLAIR has progressed in the interval. Mild pituitary enlargement for age unchanged. The mamillopontine distance is narrowed at 4.3 mm. Findings can be seen with intracranial hypotension however some of these findings can also be seen with meningitis. 3. No evidence of spinal infection in the cervical spine. Cervical spondylosis causing foraminal narrowing as described above. Electronically Signed   By: Franchot Gallo M.D.   On: 06/19/2022 13:04   MR CERVICAL SPINE W WO CONTRAST  Result Date: 06/19/2022 CLINICAL DATA:  Altered mental status. Encephalopathy. Lumbar puncture of 06/18/2022. Rule out CSF leak/spontaneous intracranial hypotension EXAM: MRI HEAD WITHOUT AND WITH CONTRAST MRI CERVICAL SPINE  WITHOUT AND WITH CONTRAST TECHNIQUE: Multiplanar, multiecho pulse sequences of the brain and surrounding structures, and cervical spine, to include the craniocervical junction and cervicothoracic junction, were obtained without and with intravenous contrast. CONTRAST:  7.60mL GADAVIST GADOBUTROL 1 MMOL/ML IV SOLN COMPARISON:  MRI head 06/17/2022 FINDINGS: MRI HEAD FINDINGS Brain: Negative for acute infarct or mass. No acute hemorrhage or fluid collection. Pituitary mildly enlarged for age measuring 8 mm in height. This is unchanged. Pituitary enhances  homogeneously. There is hyperintensity in the sulci of FLAIR imaging which has progressed in the interval. Ventricles remain mildly enlarged and unchanged. Following contrast infusion, there is diffuse pack E meningeal enhancement and mild thickening. The mamillopontine distance is 4.3 mm which is narrowed. These findings can be seen with intracranial hypotension however can be also seen with meningitis. Vascular: Normal arterial flow voids.  Normal venous enhancement Skull and upper cervical spine: No focal skeletal lesion. Sinuses/Orbits: Paranasal sinuses clear. Mild mastoid effusion bilaterally. Negative orbit Other: None MRI CERVICAL SPINE FINDINGS Alignment: Mild anterolisthesis C2-3. Mild retrolisthesis C5-6. 3 mm retrolisthesis C6-7 Vertebrae: Negative for fracture or mass. No evidence of spinal infection or abscess. Cord: Normal signal and morphology Posterior Fossa, vertebral arteries, paraspinal tissues: The patient is intubated. NG tube in feeding tube in the esophagus. No mass, adenopathy, or fluid collection in the neck. Disc levels: C2-3: Negative C3-4: Disc degeneration with asymmetric spurring on the left. Moderate left foraminal narrowing. C4-5: Disc degeneration with mild uncinate spurring. Mild left foraminal narrowing C5-6: Disc degeneration with uncinate spurring. Moderate right foraminal narrowing and mild left foraminal narrowing C6-7: Mild disc degeneration and spurring without significant stenosis C7-T1 height negative IMPRESSION: 1. Negative for acute infarct or mass. 2. Diffuse pachymeningeal enhancement and mild thickening. Sulcal hyperintensity on FLAIR has progressed in the interval. Mild pituitary enlargement for age unchanged. The mamillopontine distance is narrowed at 4.3 mm. Findings can be seen with intracranial hypotension however some of these findings can also be seen with meningitis. 3. No evidence of spinal infection in the cervical spine. Cervical spondylosis causing  foraminal narrowing as described above. Electronically Signed   By: Franchot Gallo M.D.   On: 06/19/2022 13:04   DG Abd Portable 1V  Result Date: 06/19/2022 CLINICAL DATA:  Small-bowel obstruction EXAM: PORTABLE ABDOMEN - 1 VIEW COMPARISON:  06/18/2022 FINDINGS: Nasogastric tube tip in the antrum of the stomach. Soft feeding tube in the region of the pylorus or proximal duodenum. Persistent dilated small intestine, largest loop 6 cm. Some previously administered contrast present within the stomach and within the proximal small bowel IMPRESSION: Persistent small bowel obstruction pattern. Nasogastric tube tip in the antrum of the stomach. Soft feeding tube in the region of the pylorus or proximal duodenum. Electronically Signed   By: Nelson Chimes M.D.   On: 06/19/2022 09:51   DG FL GUIDED LUMBAR PUNCTURE  Result Date: 06/18/2022 CLINICAL DATA:  Listeria meningitis, encephalopathy EXAM: DIAGNOSTIC LUMBAR PUNCTURE UNDER FLUOROSCOPIC GUIDANCE COMPARISON:  06/01/22. FLUOROSCOPY: Radiation Exposure Index (as provided by the fluoroscopic device): 2.40 mGy Kerma PROCEDURE: Informed consent was obtained from the patient's mother prior to the procedure, including potential complications of headache, allergy, and pain. With the patient prone, the lower back was prepped with Betadine. 1% Lidocaine was used for local anesthesia. Lumbar puncture was performed at the L3-L4 level using a 20g gauge needle with return of yellow, clear CSF with an opening pressure of 25 cm water. 15 ml of CSF were obtained for laboratory studies. Closing pressure was  measured at 19 cm water. The patient tolerated the procedure well and there were no apparent complications. IMPRESSION: Successful lumbar puncture as described above. This exam was performed by Pasty Spillers, PA-C, and was supervised and interpreted by Lajean Manes, MD. Electronically Signed   By: Lajean Manes M.D.   On: 06/18/2022 11:15   DG Abd Portable 1V  Result Date:  06/18/2022 CLINICAL DATA:  Small-bowel obstruction EXAM: PORTABLE ABDOMEN - 1 VIEW COMPARISON:  06/17/2022 FINDINGS: Nasogastric tube tip in the antrum of the stomach. Soft feeding tube tip is now in the region of the pylorus, previously in the descending duodenum. Persistent small bowel obstruction pattern, probably slightly worsened radiographically. Small bowel dilatation measures up to 4.6 cm. IMPRESSION: 1. Nasogastric tube tip in the antrum of the stomach. Soft feeding tube tip now in the region of the pylorus, previously in the descending duodenum. 2. Persistent small bowel obstruction pattern, probably slightly worsened. Electronically Signed   By: Nelson Chimes M.D.   On: 06/18/2022 08:02   MR BRAIN WO CONTRAST  Result Date: 06/17/2022 CLINICAL DATA:  Seizure EXAM: MRI HEAD WITHOUT CONTRAST TECHNIQUE: Multiplanar, multiecho pulse sequences of the brain and surrounding structures were obtained without intravenous contrast. COMPARISON:  CT head obtained the same day and CT head 05/30/2022 FINDINGS: Brain: There is no acute intracranial hemorrhage, extra-axial fluid collection, or acute infarct. Areas of sulcal FLAIR hyperintensity may be related to intubation/oxygenation given normal same-day head CT. Parenchymal volume is normal. The ventricles are normal in size. Gray-white differentiation is preserved. There is mild FLAIR signal abnormality in the subcortical and periventricular white matter which is nonspecific but may reflect sequela of mild chronic small-vessel ischemic change. The hippocampi are normal in signal and architecture. There is no mass lesion.  There is no mass effect or midline shift. Vascular: Normal flow voids. Skull and upper cervical spine: Normal marrow signal. Sinuses/Orbits: The paranasal sinuses are clear. The globes and orbits are unremarkable. Other: There are bilateral mastoid effusions which may be related to instrumentation. IMPRESSION: No acute intracranial pathology or  epileptogenic focus identified. Electronically Signed   By: Valetta Mole M.D.   On: 06/17/2022 17:57     Assessment/Plan: 51yo F with cirrhosis severe sepsis now on day 21 abtx for listeria bacteremia (+/- meningitis)  Fevers = agree with plan to repeat imaging of brain and neck with contrasts as renal function has improved to see if other source of infection. Reading of mri and discussion with radiology still nonspecific, can see with low intracranial pressure and or sequelae of meningitis. Afbrile this morning. Also leukocytosis improving  Hepatic encephalopathy = continue on rifaximin. No recent ascites on imaging.  CSF analysis shows lymphocytic predominance pleocytosis but only 155 cells.  Still following cultures  Continued with meropenem to cover kleb as well as listeria infection  Kleb oxytoca on trach aspirate = maybe colonized. Not having significant increase with ventilation. Continue on meropenem for now  Sbo/ileus= surgery following.   [facility] Pager: [phone]  06/19/2022, 4:46 PM

## 2022-06-19 NOTE — Progress Notes (Signed)
[facility] Progress Note  Name: Deanna Mcmillan MRN: [id number] DOB: Dec 01, 1970  Chief Complaint:  Headache and AMS on presentation  Subjective:  She had 1.5 liters UOP over 11/27.  She has been on levo at 28 mcg/min.  Spoke with her mother at bedside.  ICU team has switched meds to IV given her ileus.   Review of systems:  Unable to obtain secondary to intubated and sedated   ------------------ Background on consult:  Deanna Mcmillan is a/an 51 y.o. female with a past medical history of alcoholic cirrhosis who presents with sepsis now c/b AKI.  Patient has had a fairly prolonged hospital stay.  History was obtained per chart review and family.  Patient initially presented 18 days ago.  She had a recent history of alcoholic hepatitis in October of this year.  Otherwise she was in her normal state of health but the night before she came she developed a headache and developed altered mental status.  In the hospital she was found to be septic.  Because of her altered mental status she was intubated at that time.  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 She was started on treatment. 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 Developed an ileus early in the hospitalization but thi resolved. Then she was extubated on 11/22 and was doing okay on nasal cannular and briefly was off norepinephrine. Then around 11/24 she worsened again requiring intubated. Further complications include seizures last night and increasing pressor requirements now on 17mcg of norepi. UOP has dropped off but still with about 500cc/day. She developed recurrent ileus and now has NG tube in place again. Crt was 0.6 on 11/23 and started rising the next day now up to 2.6.   Intake/Output Summary (Last 24 hours) at 06/19/2022 0652 Last data filed at 06/19/2022 0600 Gross per 24 hour  Intake 2723.09 ml  Output 2800 ml  Net -76.91 ml    Vitals:  Vitals:   06/19/22 0530 06/19/22 0545  06/19/22 0551 06/19/22 0600  BP: (!) 130/55 (!) 128/56  122/60  Pulse: (!) 116 (!) 114  (!) 115  Resp: (!) 22 (!) 24  (!) 21  Temp:   99.7 F (37.6 C)   TempSrc:   Oral   SpO2: 98% 98%  98%  Weight:         Physical Exam:  General adult female in bed intubated HEENT normocephalic atraumatic  Neck normal circumference trachea midline Lungs clear to auscultation anteriorly no air hunger; FIO2 40, PEEP 5  Heart S1S2 no rub Abdomen soft nontender nondistended Extremities no edema appreciated Neuro - no continuous sedation currently running    Medications reviewed   Labs:     Latest Ref Rng & Units 06/19/2022    4:02 AM 06/18/2022    3:31 PM 06/18/2022    9:54 AM  BMP  Glucose 70 - 99 mg/dL 147     BUN 6 - 20 mg/dL 59     Creatinine 0.44 - 1.00 mg/dL 1.83     Sodium 135 - 145 mmol/L 142     Potassium 3.5 - 5.1 mmol/L 3.1  3.1  2.6   Chloride 98 - 111 mmol/L 102     CO2 22 - 32 mmol/L 23     Calcium 8.9 - 10.3 mg/dL 7.3        Assessment/Plan:    Non-Oliguric AKI: Likely secondary to ATN associated with shock. Does not appear dehydrated on exam. Foley in  place. - Management of shock as below - Continue supportive care  - Please renally dose antibiotics.    - Maintain MAP>65 for optimal renal perfusion.    Septic shock: on Levophed per primary team.  Antibiotics per primary team.  Hypokalemia - off of lokelma 10 gram TID which appears to be culprit. Note until recently K ran a little low.  She is not able to tolerate PO meds due to ileus so we are limited to IV.  Will order additional 20 meq IV potassium - she is on her 2nd bag now (had been ordered for 60 mg IV potassium - I cancelled that order to reduce to total of 40 meq IV given her renal failure)   AMS/seizures: Using lactulose and rifaximin.  Neurology involved.   Alcoholic cirrhosis: Continue with supportive care per primary team    Ileus: Per primary team    Listeria bacteremia and meningitis: Infectious  disease following.  abx per primary team and ID   Acute hypoxic respiratory failure: Ventilatory support per primary team   Disposition - per primary team.  Nephrology will sign off.     Claudia Desanctis, MD 06/19/2022 7:07 AM

## 2022-06-19 NOTE — Progress Notes (Signed)
NG clamped for 6 hours .Residuals 150 checked after this period .Will leave NG clamped per orders.

## 2022-06-19 NOTE — Progress Notes (Signed)
Pt transported from 2M11 to MRI 2 and back on the ventilator without complication. RT, RN and transport accompanied patient.

## 2022-06-19 NOTE — Progress Notes (Signed)
Neurology Progress Note  Major interval events/Subjective: - Remains minimally responsive, but opening eyes today    Current Facility-Administered Medications:    0.9 %  sodium chloride infusion, , Intravenous, PRN, Julian Hy, DO, Stopped at 06/14/22 0834   acetaminophen (TYLENOL) 160 MG/5ML solution 650 mg, 650 mg, Per Tube, Q6H PRN, Judd Lien, MD, 650 mg at 06/19/22 0554   Chlorhexidine Gluconate Cloth 2 % PADS 6 each, 6 each, Topical, Daily, Candee Furbish, MD, 6 each at 06/18/22 0947   dextrose 5 % solution, , Intravenous, Continuous, Brand Males, MD, Stopped at 06/17/22 1629   fentaNYL (SUBLIMAZE) injection 50-200 mcg, 50-200 mcg, Intravenous, Q30 min PRN, Chesley Mires, MD, 50 mcg at 57/26/20 3559   folic acid injection 1 mg, 1 mg, Intravenous, Daily, Chesley Mires, MD, 1 mg at 06/18/22 0947   heparin injection 5,000 Units, 5,000 Units, Subcutaneous, Q8H, Corey Harold, NP, 5,000 Units at 06/19/22 0521   insulin aspart (novoLOG) injection 0-15 Units, 0-15 Units, Subcutaneous, Q4H, Ogan, [name], MD, 2 Units at 06/19/22 0753   lactulose (CHRONULAC) enema 200 gm, 300 mL, Rectal, Daily, Virl Axe, MD, 300 mL at 06/18/22 1502   leptospermum manuka honey (MEDIHONEY) paste 1 Application, 1 Application, Topical, Daily, Brand Males, MD, 1 Application at 74/16/38 1319   levETIRAcetam (KEPPRA) IVPB 500 mg/100 mL premix, 500 mg, Intravenous, BID, Haydan Wedig L, MD, Stopped at 06/18/22 2148   lip balm (CARMEX) ointment, , Topical, PRN, Virl Axe, MD   meropenem (MERREM) 2 g in sodium chloride 0.9 % 100 mL IVPB, 2 g, Intravenous, Q12H, Sinclair, Emily S, RPH   norepinephrine (LEVOPHED) 16 mg in 21mL (0.064 mg/mL) premix infusion, 0-40 mcg/min, Intravenous, Titrated, Mannam, Praveen, MD, Last Rate: 28.1 mL/hr at 06/19/22 0800, 30 mcg/min at 06/19/22 0800   Oral care mouth rinse, 15 mL, Mouth Rinse, Q2H, Sood, Vineet, MD, 15 mL at 06/19/22 0727   Oral care  mouth rinse, 15 mL, Mouth Rinse, PRN, Halford Chessman, Vineet, MD   pantoprazole (PROTONIX) injection 40 mg, 40 mg, Intravenous, Q24H, Sood, Vineet, MD, 40 mg at 06/18/22 2130   potassium chloride 10 mEq in 50 mL *CENTRAL LINE* IVPB, 10 mEq, Intravenous, Q1 Hr x 2, Jinwala, Sagar, MD, Last Rate: 50 mL/hr at 06/19/22 0850, 10 mEq at 06/19/22 0850   propofol (DIPRIVAN) 1000 MG/100ML infusion, 0-50 mcg/kg/min, Intravenous, Continuous, Greta Doom, MD, Stopped at 06/18/22 1520   rifaximin (XIFAXAN) tablet 550 mg, 550 mg, Per Tube, BID, Brand Males, MD, 550 mg at 06/18/22 2126   sodium chloride flush (NS) 0.9 % injection 10-40 mL, 10-40 mL, Intracatheter, Q12H, Julian Hy, DO, 10 mL at 06/18/22 2133   sodium chloride flush (NS) 0.9 % injection 10-40 mL, 10-40 mL, Intracatheter, PRN, Noemi Chapel P, DO   thiamine (VITAMIN B1) injection 100 mg, 100 mg, Intravenous, Daily, Sood, Vineet, MD, 100 mg at 06/18/22 0947   white petrolatum (VASELINE) gel, , Topical, PRN, Julian Hy, DO, 1 Application at 45/36/46 0809   Exam: Vitals:   06/19/22 0753 06/19/22 0800  BP:  (!) 132/56  Pulse: (!) 109 (!) 107  Resp: (!) 22 (!) 36  Temp:    SpO2: 98% 98%   Physical Exam  Constitutional: Appears chronically ill  Psych: Minimally interactive Eyes: Scleral edema is minimal HENT: ET tube in place MSK: no joint deformities.  Cardiovascular: Mildly tachy Respiratory: Breathing comfortably on the ventilator,  GI: Mildly distended Skin: Scattered bruising throughout, some icterus  Neuro: Mental Status: Eyes open and blinking spontaneously but not tracking or following commands Cranial Nerves: II: No blink to threat. Pupils are equal, round, and reactive to light.   III,IV, VI/VIII: EOMI suppressed to VOR, partial VOR to the right but not really to the left  V/VII: Facial sensation is symmetric to eyelash brush VIII: No response to voice X/XI: Intact cough XII: Unable to assess tongue  protrusion secondary to patient's mental status  Motor/Sensory: Tone is normal. Bulk is normal. No movement of the bilateral upper extremities today Left lower extremity trace movement of toes to noxious stim, no clear movement of RLE  Plantars: Toes are mute bilaterally.  Reflexes:  Unable to elicit brachioradialis, biceps or patellar reflexes Cerebellar: Unable to assess secondary to patient's mental status    Pertinent Labs:  Basic Metabolic Panel: Recent Labs  Lab 06/15/22 0403 06/15/22 1037 06/15/22 2052 06/15/22 2107 06/16/22 0335 06/16/22 0826 06/17/22 0253 06/18/22 0338 06/18/22 0527 06/18/22 0954 06/18/22 1531 06/19/22 0402  NA 153*   < > 150*   < > 148* 143 141 137 140  --   --  142  K 4.5   < > 4.6   < > 4.7 3.8 5.3* 2.7* 2.5* 2.6* 3.1* 3.1*  CL 109  --  104  --  102 98 94* 94* 96*  --   --  102  CO2 27  --  31  --  31 25 26 24 24   --   --  23  GLUCOSE 170*  --  129*  --  144* 208* 129* 144* 135*  --   --  147*  BUN 57*  --  70*  --  76* 68* 74* 71* 67*  --   --  59*  CREATININE 0.89  --  1.88*  --  2.30* 2.24* 2.61* 2.19* 2.08*  --   --  1.83*  CALCIUM 9.9  --  9.3  --  9.1 7.8* 8.3* 7.4* 7.4*  --   --  7.3*  MG 2.6*  --  3.0*  --  2.7*  --  2.8*  --   --   --   --  2.6*  PHOS 3.6  --   --   --  6.1*  --  8.1*  --   --   --   --   --    < > = values in this interval not displayed.    CBC: Recent Labs  Lab 06/13/22 0450 06/14/22 0306 06/15/22 0403 06/15/22 1037 06/15/22 2238 06/16/22 0335 06/17/22 0253 06/18/22 1531 06/19/22 0402  WBC 9.5 10.8* 13.1*  --   --  20.6* 23.5* 16.5* 15.0*  NEUTROABS 6.9 8.1* 9.6*  --   --   --   --   --  12.0*  HGB 8.9* 9.1* 10.5*   < > 10.9* 10.2* 9.8* 8.7* 8.8*  HCT 29.2* 30.0* 32.6*   < > 32.0* 32.7* 31.1* 26.7* 27.8*  MCV 120.2* 121.0* 119.0*  --   --  118.9* 118.7* 112.7* 115.4*  PLT 229 216 277  --   --  361 324 255 242   < > = values in this interval not displayed.     Coagulation Studies: No results for  input(s): "LABPROT", "INR" in the last 72 hours.   CSF  Latest Reference Range & Units 06/01/22 13:43 06/18/22 11:04  Appearance, CSF CLEAR  CLOUDY ! HAZY !  Glucose, CSF 40 - 70 mg/dL 86 (H) 94 (H) --  serum 193 48% of serum  RBC Count, CSF 0 /cu mm 26,400 (H) 5,000 (H)  WBC, CSF 0 - 5 /cu mm 80 (HH) 191 (HH)  Segmented Neutrophils-CSF 0 - 6 % 37 (H) 3  Lymphs, CSF 40 - 80 % 53 92 (H)  Monocyte-Macrophage-Spinal Fluid 15 - 45 % 9 (L) 5 (L)  Eosinophils, CSF 0 - 1 % 1 0  Color, CSF COLORLESS  RED ! AMBER !  Supernatant  XANTHOCHROMIC XANTHOCHROMIC  Total  Protein, CSF 15 - 45 mg/dL >600 (H) >600 (H)  (HH): Data is critically high !: Data is abnormal (H): Data is abnormally high (L): Data is abnormally low Opening pressure today 25, closing pressure 19  EEG: EEG showed continuous 3-6 hz theta-delta slowing admixed with 1 to 2 seconds of generalized EEG attenuation at times. Hyperventilation and photic stimulation were not performed.    Of note, study was technically difficult to interpret due to significant electrode artifact.  MRI brain w/o contrast 06/17/2022 personally reviewed, agree with radiology:   No acute intracranial pathology or epileptogenic focus identified.   Assessment: Minimally interactive patient, likely secondary to toxic/metabolic/infectious processes. Opening eyes today which mother at bedside notes is typical for patient when clearing sedation slowly. However, exam is concerning for less movement of bilateral upper extremities today and she continues to to move the left lower extremity more than the right lower extremity, the opposite of what I would expect for a postictal Todd's phenomenon given the semiology of the event was described as prolonged left lower extremity tremoring after the GTC had resolved.  Additionally, worsening pleocytosis in the CSF is puzzling to me.  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 CSF contamination with blood, appreciate ID following) -Nonoliguric AKI likely secondary to ATN associated with shock, appreciate nephrology following -Alcoholic cirrhosis -Ileus/concern for partial small bowel obstruction, appreciate surgery following -Acute respiratory failure due to shock AKI and encephalopathy -Prolonged Qtc -Chronic anemia  Recommendations: - Will follow-up continuous EEG read today and consider discontinuing - s/p Keppra 3500 mg load, then 1000 mg BID; given renal function and somnolence reduced to 500 mg BID on 11/27, will continue to adjust as needed, now room to increase to 750 mg every 12 hours IF she has any concern for breakthrough seizure activity going forward Estimated Creatinine Clearance: 33.9 mL/min (A) (by C-G formula based on SCr of 1.83 mg/dL (H)).   CrCl 80 to 130 mL/minute/1.73 m2: 500 mg to 1.5 g every 12 hours.  CrCl 50 to <80 mL/minute/1.73 m2: 500 mg to 1 g every 12 hours.  CrCl 30 to <50 mL/minute/1.73 m2: 250 to 750 mg every 12 hours.  CrCl 15 to <30 mL/minute/1.73 m2: 250 to 500 mg every 12 hours.  CrCl <15 mL/minute/1.73 m2: 250 to 500 mg every 24 hours (expert opinion). - MRI brain and cervical spine given slightly worsening examination today, with and without contrast now given improved renal function - Neurology will continue to follow - Appreciate discussion with CCM, infectious disease and nephrology via secure chat  Lesleigh Noe MD-PhD [facility] [phone]   CRITICAL CARE Performed by: Lorenza Chick   Total critical  care time: 40 minutes  Critical care time was exclusive  of separately billable procedures and treating other patients.  Critical care was necessary to treat or prevent imminent or life-threatening deterioration.  Critical care was time spent personally by me on the following activities: development of treatment plan with patient and/or surrogate as well as nursing, discussions with consultants, evaluation of patient's response to treatment, examination of patient, obtaining history from patient or surrogate, ordering and performing treatments and interventions, ordering and review of laboratory studies, ordering and review of radiographic studies, pulse oximetry and re-evaluation of patient's condition.

## 2022-06-19 NOTE — Progress Notes (Signed)
eLink Physician-Brief Progress Note Patient Name: Deanna Mcmillan DOB: 03/28/1971 MRN: [id number]   Date of Service  06/19/2022  HPI/Events of Note  Notified of K 3.6 following K repletion.   eICU Interventions  Additional 24meq KCl IV repletion ordered. Will continue to monitor serial K carefully due to renal dysfunction and prior hyperkalemia.         [name] 06/19/2022, 10:53 PM

## 2022-06-20 ENCOUNTER — Inpatient Hospital Stay (HOSPITAL_COMMUNITY): Payer: 59

## 2022-06-20 DIAGNOSIS — A419 Sepsis, unspecified organism: Secondary | ICD-10-CM

## 2022-06-20 DIAGNOSIS — Z515 Encounter for palliative care: Secondary | ICD-10-CM | POA: Diagnosis not present

## 2022-06-20 DIAGNOSIS — R569 Unspecified convulsions: Secondary | ICD-10-CM | POA: Diagnosis not present

## 2022-06-20 DIAGNOSIS — K56609 Unspecified intestinal obstruction, unspecified as to partial versus complete obstruction: Secondary | ICD-10-CM

## 2022-06-20 DIAGNOSIS — J9601 Acute respiratory failure with hypoxia: Secondary | ICD-10-CM | POA: Diagnosis not present

## 2022-06-20 DIAGNOSIS — G9340 Encephalopathy, unspecified: Secondary | ICD-10-CM | POA: Diagnosis not present

## 2022-06-20 DIAGNOSIS — Z7189 Other specified counseling: Secondary | ICD-10-CM | POA: Diagnosis not present

## 2022-06-20 LAB — CBC WITH DIFFERENTIAL/PLATELET
Abs Immature Granulocytes: 0.24 10*3/uL — ABNORMAL HIGH (ref 0.00–0.07)
Basophils Absolute: 0.1 10*3/uL (ref 0.0–0.1)
Basophils Relative: 0 %
Eosinophils Absolute: 0 10*3/uL (ref 0.0–0.5)
Eosinophils Relative: 0 %
HCT: 26.5 % — ABNORMAL LOW (ref 36.0–46.0)
Hemoglobin: 8.4 g/dL — ABNORMAL LOW (ref 12.0–15.0)
Immature Granulocytes: 1 %
Lymphocytes Relative: 11 %
Lymphs Abs: 1.9 10*3/uL (ref 0.7–4.0)
MCH: 36.8 pg — ABNORMAL HIGH (ref 26.0–34.0)
MCHC: 31.7 g/dL (ref 30.0–36.0)
MCV: 116.2 fL — ABNORMAL HIGH (ref 80.0–100.0)
Monocytes Absolute: 1.2 10*3/uL — ABNORMAL HIGH (ref 0.1–1.0)
Monocytes Relative: 7 %
Neutro Abs: 13.2 10*3/uL — ABNORMAL HIGH (ref 1.7–7.7)
Neutrophils Relative %: 81 %
Platelets: 224 10*3/uL (ref 150–400)
RBC: 2.28 MIL/uL — ABNORMAL LOW (ref 3.87–5.11)
RDW: 16.1 % — ABNORMAL HIGH (ref 11.5–15.5)
WBC: 16.6 10*3/uL — ABNORMAL HIGH (ref 4.0–10.5)
nRBC: 0 % (ref 0.0–0.2)

## 2022-06-20 LAB — TSH: TSH: 0.095 u[IU]/mL — ABNORMAL LOW (ref 0.350–4.500)

## 2022-06-20 LAB — GLUCOSE, CAPILLARY
Glucose-Capillary: 121 mg/dL — ABNORMAL HIGH (ref 70–99)
Glucose-Capillary: 127 mg/dL — ABNORMAL HIGH (ref 70–99)
Glucose-Capillary: 131 mg/dL — ABNORMAL HIGH (ref 70–99)
Glucose-Capillary: 132 mg/dL — ABNORMAL HIGH (ref 70–99)
Glucose-Capillary: 135 mg/dL — ABNORMAL HIGH (ref 70–99)
Glucose-Capillary: 141 mg/dL — ABNORMAL HIGH (ref 70–99)

## 2022-06-20 LAB — CSF CELL COUNT WITH DIFFERENTIAL
Eosinophils, CSF: 0 % (ref 0–1)
Eosinophils, CSF: 0 % (ref 0–1)
Lymphs, CSF: 84 % — ABNORMAL HIGH (ref 40–80)
Lymphs, CSF: 89 % — ABNORMAL HIGH (ref 40–80)
Monocyte-Macrophage-Spinal Fluid: 4 % — ABNORMAL LOW (ref 15–45)
Monocyte-Macrophage-Spinal Fluid: 5 % — ABNORMAL LOW (ref 15–45)
RBC Count, CSF: 1800 /mm3 — ABNORMAL HIGH
RBC Count, CSF: 5725 /mm3 — ABNORMAL HIGH
Segmented Neutrophils-CSF: 12 % — ABNORMAL HIGH (ref 0–6)
Segmented Neutrophils-CSF: 6 % (ref 0–6)
Tube #: 1
Tube #: 4
WBC, CSF: 61 /mm3 (ref 0–5)
WBC, CSF: 88 /mm3 (ref 0–5)

## 2022-06-20 LAB — CULTURE, BLOOD (ROUTINE X 2)
Culture: NO GROWTH
Culture: NO GROWTH
Special Requests: ADEQUATE
Special Requests: ADEQUATE

## 2022-06-20 LAB — COMPREHENSIVE METABOLIC PANEL
ALT: 22 U/L (ref 0–44)
AST: 40 U/L (ref 15–41)
Albumin: 2.9 g/dL — ABNORMAL LOW (ref 3.5–5.0)
Alkaline Phosphatase: 87 U/L (ref 38–126)
Anion gap: 13 (ref 5–15)
BUN: 71 mg/dL — ABNORMAL HIGH (ref 6–20)
CO2: 21 mmol/L — ABNORMAL LOW (ref 22–32)
Calcium: 7.5 mg/dL — ABNORMAL LOW (ref 8.9–10.3)
Chloride: 109 mmol/L (ref 98–111)
Creatinine, Ser: 1.83 mg/dL — ABNORMAL HIGH (ref 0.44–1.00)
GFR, Estimated: 33 mL/min — ABNORMAL LOW (ref >60)
Glucose, Bld: 132 mg/dL — ABNORMAL HIGH (ref 70–99)
Potassium: 3.9 mmol/L (ref 3.5–5.1)
Sodium: 143 mmol/L (ref 135–145)
Total Bilirubin: 4.9 mg/dL — ABNORMAL HIGH (ref 0.3–1.2)
Total Protein: 6.4 g/dL — ABNORMAL LOW (ref 6.5–8.1)

## 2022-06-20 LAB — T4, FREE: Free T4: 0.72 ng/dL (ref 0.61–1.12)

## 2022-06-20 LAB — PROTEIN AND GLUCOSE, CSF
Glucose, CSF: 92 mg/dL — ABNORMAL HIGH (ref 40–70)
Total  Protein, CSF: 423 mg/dL — ABNORMAL HIGH (ref 15–45)

## 2022-06-20 LAB — BILIRUBIN, FRACTIONATED(TOT/DIR/INDIR)
Bilirubin, Direct: 2.5 mg/dL — ABNORMAL HIGH (ref 0.0–0.2)
Indirect Bilirubin: 2.2 mg/dL — ABNORMAL HIGH (ref 0.3–0.9)
Total Bilirubin: 4.7 mg/dL — ABNORMAL HIGH (ref 0.3–1.2)

## 2022-06-20 LAB — HIV ANTIBODY (ROUTINE TESTING W REFLEX): HIV Screen 4th Generation wRfx: NONREACTIVE

## 2022-06-20 LAB — AMMONIA: Ammonia: 54 umol/L — ABNORMAL HIGH (ref 9–35)

## 2022-06-20 MED ORDER — SODIUM CHLORIDE 0.9 % IV SOLN
1000.0000 mg | Freq: Once | INTRAVENOUS | Status: AC
  Administered 2022-06-24: 1000 mg via INTRAVENOUS
  Filled 2022-06-20: qty 16

## 2022-06-20 MED ORDER — SODIUM CHLORIDE 0.9 % IV SOLN
1000.0000 mg | Freq: Once | INTRAVENOUS | Status: AC
  Administered 2022-06-20: 1000 mg via INTRAVENOUS
  Filled 2022-06-20: qty 16

## 2022-06-20 MED ORDER — VITAL 1.5 CAL PO LIQD
1000.0000 mL | ORAL | Status: DC
  Administered 2022-06-20: 1000 mL

## 2022-06-20 MED ORDER — SODIUM CHLORIDE 0.9 % IV SOLN
1000.0000 mg | Freq: Once | INTRAVENOUS | Status: AC
  Administered 2022-06-22: 1000 mg via INTRAVENOUS
  Filled 2022-06-20: qty 16

## 2022-06-20 MED ORDER — SODIUM CHLORIDE 0.9 % IV SOLN
1000.0000 mg | Freq: Once | INTRAVENOUS | Status: AC
  Administered 2022-06-23: 1000 mg via INTRAVENOUS
  Filled 2022-06-20: qty 16

## 2022-06-20 MED ORDER — SODIUM CHLORIDE 0.9 % IV SOLN
1000.0000 mg | Freq: Once | INTRAVENOUS | Status: AC
  Administered 2022-06-21: 1000 mg via INTRAVENOUS
  Filled 2022-06-20: qty 16

## 2022-06-20 MED ORDER — LIDOCAINE HCL (PF) 1 % IJ SOLN
2.0000 mL | Freq: Once | INTRAMUSCULAR | Status: AC
  Administered 2022-06-20: 2 mL via INTRADERMAL

## 2022-06-20 NOTE — Progress Notes (Signed)
Pt transported from [facility] to [facility] and back on the ventilator without complication. RT, RN and transport accompanied pt.

## 2022-06-20 NOTE — Progress Notes (Signed)
Brief Nutrition Support Note  Discussed in rounds. Pt did well with OGT clamping yesterday. Per surgery ok to start trickles. If tolerated, can likely advance rate tomorrow. Discussed with RN on the unit. Vital 1.5 @ 56mL/ hour entered. If tolerated, recommend the following goal regimen: Vital 1.5 @ 29mL/h (1.32L/d) Prosource TF 1x/d Provides 2060kcal, 109g protein, and 1044mL of free water  Ranell Patrick, RD, LDN Clinical Dietitian RD pager # available in [facility]  After hours/weekend pager # available in [facility]

## 2022-06-20 NOTE — Progress Notes (Signed)
NAME:  Deanna Mcmillan, MRN:  [id number], DOB:  March 08, 1971, LOS: 90 ADMISSION DATE:  06/07/2022, CONSULTATION DATE:  11/7 REFERRING MD:  Lendon Colonel, EDP CHIEF COMPLAINT:  AMS   History of Present Illness:  51 yo woman with hx of recent diagnosis of alcoholic hepatitis (dx 47/42), here with ams, fever.   History from her mother Deanna Mcmillan.  Patient was in her normal state of health, until suddenly the night PTA she developed a headache.  She went to sleep to rest.  The following day she slept throughout the day, that evening her mother went to wake her and was unable to wake her up, so she called 911.     She has been doing well since her recent hospital discharge, taking all the meds as prescribed, no new meds.  Planned follow up with GI on 11/14.  LE edema had been improving.  No cough, sob no focal symptoms.  No known sick contacts.     In ED, febrile, tachycardic, lethargic, desaturating to 70s.   Intubated by ED physician.    Had been admitted earlier in October and started on course of prenisone for alc hepatitis.    Recently seen at [facility] 10/19: LE Edema. Lasix not helpful at home.  Admitted for diuresis.  Jaundice and abd distension also noted.  Pleural effusion.  GI saw: non enough ascites for tap  Started lasix and spironolactone.  28 day course of prednisolone, miralax, lactulose, miralax. Midodrine TID    In ED got cefepime, LR 1L, flagyl ordered, intubated, started on Propofol, vanc ordered   Pertinent  Medical History  ETOH Cirrhosis BLE edema recently worsening  S/p lap appy 2014    Echo: recent normal [id number]   Home meds: folic ascid, acetaminohpen, lasix 40bid, lactulose 30 TID protonix, prednisone (04/29/22), prednisolone 04/28/22 three week course, thiamine, simethicone,    Per her mother she had been drinking at least one bottle of wine daily, she isnt clear exactly how much she was drinking but she did also drink [place].  She notes she had wanted to stop recently but  been unable to.  She thinks she was depressed after experiencing perimenopause.  No tobacco.   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 11/18 more awake, following commands 1121 RUE PICC 11/22 extubated to BiPAP 11/23 - 14d of Rx complerted for MSSA/MESSE. For listeria - ID wants to extend amp/gent to 3 weeks 11/8-11/29. Ileus post extubation and NG to LIS . Mellette increased. BP sofft an dlevophed and alb Rx.   11/24 -  OG placed yesterday for ileus and bile returns. TF on hold. Lactulose increased yesterday. REamins off vent. Did not need BiPAP. This AM more obtunded but mom thinks was awake at night and is sleeping due to fatigue. Afebrile.  But wBC up 13L. Neg baklance -3L:  OFF PRESSORs = Ammondia up at 72 - [place] mild periphepatic ascites Blood culture  11/25 - Now intubated due to obtunded encephaopathy. CTAbc with SBO v Ileus with transition ponit . CCS consult - no surgery but ok for rectal lactulose w ith continue NG/OG tube to LIS. ID ordered repeat blood culture. Linezolid added.  No ntrition since 06/14/22 Pm. Creat worsening. LEss Ur OP. On levophed 53mcg. Not on sedation. Vent 40% Ammonia 108 Trach aspirate 11/27 - Remains intubated. On propofol 33mcg and levophed 12mcg. Febrile up to 101.59F overnight. On ampicillin and linezolid, going for IR guided LP today. Kidney function  improving, repleting potassium. No seizure activity noted overnight on EEG. Surgery following for ileus, no need for surgical intervention. Holding TF for today.   Interim History / Subjective:   Low grade fever overnight (100.99F) and febrile to 102.59F this AM. Slight worsening leukocytosis. Extensive discussion with neurology and ID, planning to repeat LP today via IR and perform autoimmune workup for further evaluation. She remains minimally responsive on exam despite being off sedation for the past 2 days. Remains on levophed.  Ileus is improving, plan to start trickle TF today per surgery and assess tolerance.  Objective   Blood pressure 116/65, pulse (!) 115, temperature (!) 101 F (38.3 C), temperature source Axillary, resp. rate (!) 25, weight 64.1 kg, SpO2 98 %.    Vent Mode: PRVC FiO2 (%):  [40 %] 40 % Set Rate:  [20 bmp] 20 bmp Vt Set:  [430 mL] 430 mL PEEP:  [5 cmH20] 5 cmH20 Plateau Pressure:  [15 cmH20-20 cmH20] 17 cmH20   Intake/Output Summary (Last 24 hours) at 06/20/2022 1542 Last data filed at 06/20/2022 1400 Gross per 24 hour  Intake 1303.2 ml  Output 2197 ml  Net -893.8 ml   Filed Weights   06/18/22 0355 06/19/22 0500 06/20/22 0500  Weight: 63.7 kg 67.1 kg 64.1 kg   General: critically ill-appearing middle aged female, on mech vent, NAD. HEENT: [place]/AT, scleral icterus present, ETT and G tube in place. CV: tachycardic rate and regular rhythm, no murmurs noted. Pulm: breathing comfortably on vent with ventilated breath sounds (synchronous). GI: soft, mildly distended, hypoactive bowel sounds. Skin: warm and dry. Extremities: bruising on bilateral UE. Neuro: minimally responsive. Eyes slightly open, blinking spontaneously. Unable to track or follow commands. Normal bulk and tone. Slight head movement in response to painful stimuli of proximal LUE.    Resolved Hospital Problem list   Hypokalemia Hypophosphatemia Hypernatremia Thrombocytopenia  Assessment &  Plan:   Hypotension/Circulatory shock -recurred 06/14/22, and again 06/15/22 Remains on levophed gtt. Midodrine being held while ileus improves. -MAP goal >65, on pressor support -- wean as tolerated -holding midodrine while ileus improves -holding lasix and aldactone given pressor need  Encephalopathy - hepatic and metabolic New Seizure (32/20) 2/2 hyperammonemia, listeria meningitis, uremia Unclear etiology of persistent encephalopathy. She has been off of sedation for the past 2 days yet has only minimal responsiveness. Per neuro, MRI findings of FLAIR hyperintensity can be artifact in the setting of intubation versus progressive infectious/inflammatory process. Planning to repeat lumbar puncture to rule out increased ICP and malignancy (lymphoma). -rectal lactulose -continue merrem per ID -appreciate neuro and ID assistance -remains NPO -appreciate palliative care assistance, continue full scope of care -Repeat LP for autoimmune CSF encephalitis panel -Check opening pressure, protein, glucose, oligoclonal bands, IgG index, bacterial culture, fungal culture, anaerobic culture, VDRL, CSF flow cytometry, CSF cytopathology, autoimmune CSF encephalitis panel   Cirrhosis  Recently diagnosed cirrhosis during last hospitalization. MELD Na score of 17. Child Pugh Class C. FIB-4 score  of 7.46 indicates advance fibrosis. Initially thought to be alcoholic in etiology, but unclear given patient stopped drinking a couple of months prior to diagnosis. Will perform autoimmune workup for further evaluation. 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 Per surgery, will start trickle TF through today and assess  tolerance. If tolerates, can advance TF tomorrow. - NPO - lactulose enema to promote BM - remove NGT - trickle TF through today, advance tomorrow if tolerates - appreciate CCS assistance   Listeria bacteremia and meningitis MSSA bacteremia  Klebsiella oxytoca on resp culture On appropriate coverage per ID. Her MSSA and listeria should have been adequately treated by this time. As mentioned above, performing repeat LP and autoimmune workup to investigate other etiologies. -continue meropenem -LP and autoimmune workup as outlined above -appreciate ID recommendations -will need TEE once extubated  Acute hypoxic respiratory failure (CAP versus aspiration pneumonia and Compressive atelectasis ) - Extubated 11/22, reintubated 06/15/22 - 11/29 -- does not meet criteria for SBT/Extubation in setting of Acute Respiratory Failure due to shock, AKI, encephalopathy Plan - Oral care - HOB > 30 - PRVC - No SBT  AKI (Baseline 0.6- 0.9) - improving Kidney function stable today. - MAP goal >= 65 - avoid nephrotoxic meds as able - monitor UOP - trend kidney function  Hx SVT Prolonged QT interval  -512msec 06/04/22;   513 msec 06/17/22 - monitor - avoid Qtc prolongation drugs  Anemia - chronic (baseline 8gm-9gm% in oct 2023)- Present on Admit -appears to be at baseline, no active bleeding noted -transfuse if hgb <7 and/or hemodynamic instability  Protein caloric malnutrition, mild - starting trickle TF today, if tolerates will advance tomorrow - D5w for now kvo  Hyperglycemia, controlled -continue SSI prn -goal BG 140-180  Severe physical deconditioning - She will need aggressive PT/OT (likely CIR) for strength training.   Best Practice (right click and "Reselect all SmartList Selections" daily)   Diet/type: tubefeeds - starting trickle TF DVT prophylaxis: prophylactic heparin  GI prophylaxis: PPI Lines: Central line - RUE PICC since 06/12/22 Foley:  Yes, and it is still  needed Code Status:  full code Last date of multidisciplinary goals of care discussion [updated mother at bedside 11/22, 06/14/21, 06/15/22, 06/16/22, 06/17/22, 11/27, 11/28, 11/29]   Virl Axe, MD 06/20/2022, 3:42 PM    LABS    PULMONARY Recent Labs  Lab 06/15/22 1037 06/15/22 2107 06/15/22 2238  PHART 7.557* 7.564* 7.502*  PCO2ART 35.2 38.2 43.8  PO2ART 67* 91 69*  HCO3 31.3* 34.7* 34.5*  TCO2 32 36* 36*  O2SAT 95 98 95    CBC Recent Labs  Lab 06/18/22 1531 06/19/22 0402 06/20/22 0352  HGB 8.7* 8.8* 8.4*  HCT 26.7* 27.8* 26.5*  WBC 16.5* 15.0* 16.6*  PLT 255 242 224    COAGULATION Recent Labs  Lab 06/16/22 0335  INR 1.4*    CHEMISTRY Recent Labs  Lab 06/15/22 0403 06/15/22 1037 06/15/22 2052 06/15/22 2107 06/16/22 0335 06/16/22 0826 06/17/22 0253 06/18/22 0338 06/18/22 0527 06/18/22 0954 06/19/22 0402 06/19/22 2025 06/20/22 0352  NA 153*   < > 150*   < > 148*   < > 141 137 140  --  142  --  143  K 4.5   < > 4.6   < >  4.7   < > 5.3* 2.7* 2.5*   < > 3.1* 3.4* 3.9  CL 109  --  104  --  102   < > 94* 94* 96*  --  102  --  109  CO2 27  --  31  --  31   < > 26 24 24   --  23  --  21*  GLUCOSE 170*  --  129*  --  144*   < > 129* 144* 135*  --  147*  --  132*  BUN 57*  --  70*  --  76*   < > 74* 71* 67*  --  59*  --  71*  CREATININE 0.89  --  1.88*  --  2.30*   < > 2.61* 2.19* 2.08*  --  1.83*  --  1.83*  CALCIUM 9.9  --  9.3  --  9.1   < > 8.3* 7.4* 7.4*  --  7.3*  --  7.5*  MG 2.6*  --  3.0*  --  2.7*  --  2.8*  --   --   --  2.6*  --   --   PHOS 3.6  --   --   --  6.1*  --  8.1*  --   --   --   --   --   --    < > = values in this interval not displayed.   Estimated Creatinine Clearance: 31.1 mL/min (A) (by C-G formula based on SCr of 1.83 mg/dL (H)).   LIVER Recent Labs  Lab 06/16/22 0335 06/17/22 0253 06/18/22 0338 06/19/22 0402 06/20/22 0352 06/20/22 1141  AST 62* 60* 51* 42* 40  --   ALT 21 21 20 20 22   --   ALKPHOS 108 107  78 71 87  --   BILITOT 2.8* 3.3* 3.4* 4.5* 4.9* 4.7*  PROT 7.1 7.4 6.5 6.8 6.4*  --   ALBUMIN 2.9* 3.8 3.3* 3.3* 2.9*  --   INR 1.4*  --   --   --   --   --      INFECTIOUS Recent Labs  Lab 06/15/22 1054 06/16/22 0335 06/16/22 0826 06/17/22 0253 06/17/22 0514  LATICACIDVEN 1.7 2.1* 1.9 6.8* 1.8  PROCALCITON 0.55 1.63  --  0.78  --      ENDOCRINE CBG (last 3)  Recent Labs    06/20/22 0757 06/20/22 1121 06/20/22 1538  GLUCAP 131* 127* 141*         IMAGING x48h  - image(s) personally visualized  -   highlighted in bold DG FL GUIDED LUMBAR PUNCTURE  Result Date: 06/20/2022 CLINICAL DATA:  Patient with listeria meningitis, encephalopathy, 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 Previous fluoroscopically guided lumbar puncture 06/01/22, 06/18/22. Request for repeat lumbar puncture to further evaluate. EXAM: LUMBAR PUNCTURE UNDER FLUOROSCOPY PROCEDURE: An appropriate skin entry site was determined fluoroscopically. Operator donned sterile gloves and mask. Skin site was marked, then prepped with Betadine, draped in usual sterile fashion, and infiltrated locally with 1% lidocaine. A 20 gauge spinal needle advanced into the thecal sac at L3-4 level. Hazy yellow CSF spontaneously returned, with opening pressure of 23 cm water. 20 ml CSF were collected and divided among 4 sterile vials for the requested laboratory studies. Closing pressure 12 cm water The needle was then removed. The patient tolerated the procedure well and there were no complications. FLUOROSCOPY: Radiation Exposure Index (as provided by the fluoroscopic device): 8.10 mGy Kerma IMPRESSION: Technically successful  lumbar puncture under fluoroscopy. This exam was performed by Candiss Norse, PA-C, and was supervised and interpreted by Nelson Chimes, MD. Electronically Signed   By: Nelson Chimes M.D.   On: 06/20/2022 14:15   DG CHEST PORT 1 VIEW  Result Date: 06/19/2022 CLINICAL DATA:  Hypoxia EXAM:  PORTABLE CHEST 1 VIEW COMPARISON:  06/15/2022 FINDINGS: Endotracheal tube seen 3.7 cm above the carina, withdrawn since prior examination. Nasogastric tube and nasoenteric feeding tube extending into the upper abdomen beyond the margin of the examination. Right upper extremity PICC line tip noted within the superior right atrium. Lung volumes are small, but are stable since prior examination. Partial right lower lobe collapse noted. Left perihilar pulmonary infiltrate appears stable, asymmetric pulmonary edema versus infection. No pneumothorax or pleural effusion. Cardiac size within normal limits. IMPRESSION: 1. Endotracheal tube slightly withdrawn since prior examination, now 3.7 cm above the carina. Otherwise stable support lines and tubes. 2. Stable pulmonary hypoinflation with right lower lobe collapse. 3. Stable left perihilar pulmonary infiltrate, edema versus infection. Electronically Signed   By: Fidela Salisbury M.D.   On: 06/19/2022 15:23   MR BRAIN W WO CONTRAST  Result Date: 06/19/2022 CLINICAL DATA:  Altered mental status. Encephalopathy. Lumbar puncture of 06/18/2022. Rule out CSF leak/spontaneous intracranial hypotension EXAM: MRI HEAD WITHOUT AND WITH CONTRAST MRI CERVICAL SPINE WITHOUT AND WITH CONTRAST TECHNIQUE: Multiplanar, multiecho pulse sequences of the brain and surrounding structures, and cervical spine, to include the craniocervical junction and cervicothoracic junction, were obtained without and with intravenous contrast. CONTRAST:  7.67mL GADAVIST GADOBUTROL 1 MMOL/ML IV SOLN COMPARISON:  MRI head 06/17/2022 FINDINGS: MRI HEAD FINDINGS Brain: Negative for acute infarct or mass. No acute hemorrhage or fluid collection. Pituitary mildly enlarged for age measuring 8 mm in height. This is unchanged. Pituitary enhances homogeneously. There is hyperintensity in the sulci of FLAIR imaging which has progressed in the interval. Ventricles remain mildly enlarged and unchanged. Following contrast  infusion, there is diffuse pack E meningeal enhancement and mild thickening. The mamillopontine distance is 4.3 mm which is narrowed. These findings can be seen with intracranial hypotension however can be also seen with meningitis. Vascular: Normal arterial flow voids.  Normal venous enhancement Skull and upper cervical spine: No focal skeletal lesion. Sinuses/Orbits: Paranasal sinuses clear. Mild mastoid effusion bilaterally. Negative orbit Other: None MRI CERVICAL SPINE FINDINGS Alignment: Mild anterolisthesis C2-3. Mild retrolisthesis C5-6. 3 mm retrolisthesis C6-7 Vertebrae: Negative for fracture or mass. No evidence of spinal infection or abscess. Cord: Normal signal and morphology Posterior Fossa, vertebral arteries, paraspinal tissues: The patient is intubated. NG tube in feeding tube in the esophagus. No mass, adenopathy, or fluid collection in the neck. Disc levels: C2-3: Negative C3-4: Disc degeneration with asymmetric spurring on the left. Moderate left foraminal narrowing. C4-5: Disc degeneration with mild uncinate spurring. Mild left foraminal narrowing C5-6: Disc degeneration with uncinate spurring. Moderate right foraminal narrowing and mild left foraminal narrowing C6-7: Mild disc degeneration and spurring without significant stenosis C7-T1 height negative IMPRESSION: 1. Negative for acute infarct or mass. 2. Diffuse pachymeningeal enhancement and mild thickening. Sulcal hyperintensity on FLAIR has progressed in the interval. Mild pituitary enlargement for age unchanged. The mamillopontine distance is narrowed at 4.3 mm. Findings can be seen with intracranial hypotension however some of these findings can also be seen with meningitis. 3. No evidence of spinal infection in the cervical spine. Cervical spondylosis causing foraminal narrowing as described above. Electronically Signed   By: Franchot Gallo M.D.   On: 06/19/2022  13:04   MR CERVICAL SPINE W WO CONTRAST  Result Date:  06/19/2022 CLINICAL DATA:  Altered mental status. Encephalopathy. Lumbar puncture of 06/18/2022. Rule out CSF leak/spontaneous intracranial hypotension EXAM: MRI HEAD WITHOUT AND WITH CONTRAST MRI CERVICAL SPINE WITHOUT AND WITH CONTRAST TECHNIQUE: Multiplanar, multiecho pulse sequences of the brain and surrounding structures, and cervical spine, to include the craniocervical junction and cervicothoracic junction, were obtained without and with intravenous contrast. CONTRAST:  7.46mL GADAVIST GADOBUTROL 1 MMOL/ML IV SOLN COMPARISON:  MRI head 06/17/2022 FINDINGS: MRI HEAD FINDINGS Brain: Negative for acute infarct or mass. No acute hemorrhage or fluid collection. Pituitary mildly enlarged for age measuring 8 mm in height. This is unchanged. Pituitary enhances homogeneously. There is hyperintensity in the sulci of FLAIR imaging which has progressed in the interval. Ventricles remain mildly enlarged and unchanged. Following contrast infusion, there is diffuse pack E meningeal enhancement and mild thickening. The mamillopontine distance is 4.3 mm which is narrowed. These findings can be seen with intracranial hypotension however can be also seen with meningitis. Vascular: Normal arterial flow voids.  Normal venous enhancement Skull and upper cervical spine: No focal skeletal lesion. Sinuses/Orbits: Paranasal sinuses clear. Mild mastoid effusion bilaterally. Negative orbit Other: None MRI CERVICAL SPINE FINDINGS Alignment: Mild anterolisthesis C2-3. Mild retrolisthesis C5-6. 3 mm retrolisthesis C6-7 Vertebrae: Negative for fracture or mass. No evidence of spinal infection or abscess. Cord: Normal signal and morphology Posterior Fossa, vertebral arteries, paraspinal tissues: The patient is intubated. NG tube in feeding tube in the esophagus. No mass, adenopathy, or fluid collection in the neck. Disc levels: C2-3: Negative C3-4: Disc degeneration with asymmetric spurring on the left. Moderate left foraminal narrowing.  C4-5: Disc degeneration with mild uncinate spurring. Mild left foraminal narrowing C5-6: Disc degeneration with uncinate spurring. Moderate right foraminal narrowing and mild left foraminal narrowing C6-7: Mild disc degeneration and spurring without significant stenosis C7-T1 height negative IMPRESSION: 1. Negative for acute infarct or mass. 2. Diffuse pachymeningeal enhancement and mild thickening. Sulcal hyperintensity on FLAIR has progressed in the interval. Mild pituitary enlargement for age unchanged. The mamillopontine distance is narrowed at 4.3 mm. Findings can be seen with intracranial hypotension however some of these findings can also be seen with meningitis. 3. No evidence of spinal infection in the cervical spine. Cervical spondylosis causing foraminal narrowing as described above. Electronically Signed   By: Franchot Gallo M.D.   On: 06/19/2022 13:04   DG Abd Portable 1V  Result Date: 06/19/2022 CLINICAL DATA:  Small-bowel obstruction EXAM: PORTABLE ABDOMEN - 1 VIEW COMPARISON:  06/18/2022 FINDINGS: Nasogastric tube tip in the antrum of the stomach. Soft feeding tube in the region of the pylorus or proximal duodenum. Persistent dilated small intestine, largest loop 6 cm. Some previously administered contrast present within the stomach and within the proximal small bowel IMPRESSION: Persistent small bowel obstruction pattern. Nasogastric tube tip in the antrum of the stomach. Soft feeding tube in the region of the pylorus or proximal duodenum. Electronically Signed   By: Nelson Chimes M.D.   On: 06/19/2022 09:51

## 2022-06-20 NOTE — Progress Notes (Signed)
Daily Progress Note   Patient Name: Deanna Mcmillan       Date: 06/20/2022 DOB: [birth year]  Age: 51 y.o. MRN#: [id number] Attending Physician: Marshell Garfinkel, MD Primary Care Physician: Elisabeth Cara, [place] Admit Date: 06/05/2022  Reason for Consultation/Follow-up: Establishing goals of care  Subjective: Intubated - does not wake to voice or gentle touch  Length of Stay: 22  Current Medications: Scheduled Meds:   Chlorhexidine Gluconate Cloth  6 each Topical Daily   folic acid  1 mg Intravenous Daily   heparin  5,000 Units Subcutaneous Q8H   insulin aspart  0-15 Units Subcutaneous Q4H   lactulose  300 mL Rectal Daily   leptospermum manuka honey  1 Application Topical Daily   mouth rinse  15 mL Mouth Rinse Q2H   pantoprazole (PROTONIX) IV  40 mg Intravenous Q24H   rifaximin  550 mg Per Tube BID   sodium chloride flush  10-40 mL Intracatheter Q12H   thiamine (VITAMIN B1) injection  100 mg Intravenous Daily    Continuous Infusions:  sodium chloride 10 mL/hr at 06/20/22 0000   dextrose Stopped (06/17/22 1629)   levETIRAcetam 500 mg (06/20/22 0942)   meropenem (MERREM) IV Stopped (06/19/22 2327)   norepinephrine (LEVOPHED) Adult infusion 24 mcg/min (06/20/22 0941)   propofol (DIPRIVAN) infusion Stopped (06/18/22 1520)    PRN Meds: sodium chloride, acetaminophen (TYLENOL) oral liquid 160 mg/5 mL, fentaNYL (SUBLIMAZE) injection, lip balm, mouth rinse, sodium chloride flush, white petrolatum  Physical Exam Constitutional:      General: She is not in acute distress.    Appearance: She is ill-appearing.     Comments: Intubated - does not wake to voice or gentle touch  Pulmonary:     Effort: Pulmonary effort is normal.  Skin:    General: Skin is warm and dry.             Vital  Signs: BP (!) 122/51   Pulse (!) 118   Temp (!) 102.4 F (39.1 C) (Axillary)   Resp (!) 29   Wt 64.1 kg   SpO2 99%   BMI 24.26 kg/m  SpO2: SpO2: 99 % O2 Device: O2 Device: Ventilator O2 Flow Rate: O2 Flow Rate (L/min): 4 L/min  Intake/output summary:  Intake/Output Summary (Last 24 hours) at 06/20/2022 1012 Last data filed at 06/20/2022 0800 Gross per 24 hour  Intake 2228.55 ml  Output 2047 ml  Net 181.55 ml   LBM: Last BM Date : 06/20/22 Baseline Weight: Weight: 101 kg Most recent weight: Weight: 64.1 kg   Patient Active Problem List   Diagnosis Date Noted   MSSA bacteremia 06/16/2022   Small bowel obstruction ([place]) 06/16/2022   Compromised airway 06/15/2022   Septic shock ([place]) 06/14/2022   On mechanically assisted ventilation ([place]) 06/11/2022   Sepsis with acute hypoxic respiratory failure ([place]) 06/11/2022   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   Acute pulmonary edema ([place]) 03/55/9741   Toxic metabolic encephalopathy 63/84/5364   Pressure injury of skin 06/06/2022   Acute respiratory failure with hypoxia ([place]) 06/15/2022   Anasarca 05/13/2022   Abnormal LFTs 68/09/2120   Alcoholic hepatitis 48/25/0037   Normal anion gap metabolic acidosis 04/88/8916   Hyperglycemia 05/13/2022  Coagulopathy ([place]) 05/13/2022   Thrombocytopenia ([place]) 05/13/2022   Macrocytic anemia 05/13/2022   Obesity (BMI 30-39.9) 05/13/2022   Leg edema 05/11/2022   Advanced care planning/counseling discussion    Depression, major, single episode, moderate ([place])    Goals of care, counseling/discussion    Serum total bilirubin elevated 04/24/2022   Alcohol induced acute pancreatitis 79/39/6886   Alcoholic liver disease, unspecified ([place]) 04/23/2022   Hypoalbuminemia 04/23/2022   Hyponatremia 04/23/2022   Alcohol abuse 04/23/2022   QT prolongation 04/23/2022   Hypokalemia 04/23/2022   Appendicitis, acute s/p laparoscopic appendectomy 04/11/13 04/12/2013    Palliative Care Assessment & Plan   HPI: 51  y.o. female  with past medical history of ETOH cirrhosis admitted on 05/27/2022 with severe sepsis and acute metabolic encephalopathy. 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 With acute respiratory failure as well - extubated 11/22. PMT consulted to discuss [place].     Assessment: Follow up today with patient's mother at bedside.  We review events since we met last week including worsening mental status, reintubation, and seizure. Discussed plans for another LP. Mother reviews the emotional roller coaster - some things getting better then some things getting worse. She tells me she is fixated on getting more answers and finding out why patient is not waking up more. She repeatedly tells me she wants the care team "to fix her". We discuss gravity of her illness and she reports understanding. She tells me she feels that patient's son understands severity of illness   We review that prior to hospitalization patient was doing well, fully functional. Patient's rapid decline is shocking for family.   We review our conversation from last week - patient's mother tells me goals remain the same. She is interested in all medical efforts to prolong life and this would include trach if necessary.  Chaplaincy support offered - declined.   We discuss that PMT will continue to see intermittently. She is agreeable.   Recommendations/Plan: Open to all options offered to prolong life at this point including trach PMT will follow intermittently - mother has our contact info   Code Status: Full code  Care plan was discussed with critical care team, RN, patient's mother  Thank you for allowing the Palliative Medicine Team to assist in the care of this patient.   *Please note that this is a verbal dictation therefore any spelling or grammatical errors are due to the "[place] [employer]" system interpretation.  Juel Burrow, DNP, [facility] Palliative Medicine Team Team Phone # [phone]  Pager [phone]

## 2022-06-20 NOTE — Progress Notes (Signed)
CSF results reviewed, opening pressure remains within normal range at 23 cm of water  CSF: RBC 5725/1800, WBC 61/88 (lymphocytic predominance), xanthochromic with protein of 423 and glucose of 92 (serum 141)  I have ordered methylprednisolone 1 g every 20 hours for 5 doses (to allow gradual more physiological morning dosing of methylprednisolone without delaying initiation)  I have also added on VZV IgG in the CSF, as this can be more sensitive than VZV PCR; VZV can cause this type of CSF picture However I have held off on adding acyclovir at this time due to concern for nephrotoxicity with her significantly impaired renal function and rising BUN at this time, and negative VZV PCR x 2 Will discuss further with infectious disease and CCM team in the morning  Lesleigh Noe MD-PhD [facility] [phone] Available 7 AM to 7 PM, outside these hours please contact Neurologist on call listed on AMION

## 2022-06-20 NOTE — Progress Notes (Signed)
LTM EEG discontinued - no skin breakdown at unhook. [facility] notified 

## 2022-06-20 NOTE — Progress Notes (Signed)
Neurology Progress Note  Major interval events/Subjective: - Remains minimally responsive, still opening eyes today  - Again febrile overnight/early this morning  Current Facility-Administered Medications:    0.9 %  sodium chloride infusion, , Intravenous, PRN, Julian Hy, DO, Last Rate: 10 mL/hr at 06/20/22 0000, Infusion Verify at 06/20/22 0000   acetaminophen (TYLENOL) 160 MG/5ML solution 650 mg, 650 mg, Per Tube, Q6H PRN, Judd Lien, MD, 650 mg at 06/20/22 0352   Chlorhexidine Gluconate Cloth 2 % PADS 6 each, 6 each, Topical, Daily, Candee Furbish, MD, 6 each at 06/19/22 0923   dextrose 5 % solution, , Intravenous, Continuous, Brand Males, MD, Stopped at 06/17/22 1629   fentaNYL (SUBLIMAZE) injection 50-200 mcg, 50-200 mcg, Intravenous, Q30 min PRN, Chesley Mires, MD, 50 mcg at 62/37/62 8315   folic acid injection 1 mg, 1 mg, Intravenous, Daily, Chesley Mires, MD, 1 mg at 06/19/22 0924   heparin injection 5,000 Units, 5,000 Units, Subcutaneous, Q8H, Corey Harold, NP, 5,000 Units at 06/20/22 0519   insulin aspart (novoLOG) injection 0-15 Units, 0-15 Units, Subcutaneous, Q4H, Ogan, [name], MD, 2 Units at 06/20/22 0352   lactulose (CHRONULAC) enema 200 gm, 300 mL, Rectal, Daily, Virl Axe, MD, 300 mL at 06/19/22 0924   leptospermum manuka honey (MEDIHONEY) paste 1 Application, 1 Application, Topical, Daily, Brand Males, MD, 1 Application at 17/61/60 0924   levETIRAcetam (KEPPRA) IVPB 500 mg/100 mL premix, 500 mg, Intravenous, BID, Davisha Linthicum L, MD, Paused at 06/19/22 2127   lip balm (CARMEX) ointment, , Topical, PRN, Virl Axe, MD   meropenem (MERREM) 2 g in sodium chloride 0.9 % 100 mL IVPB, 2 g, Intravenous, Q12H, Sinclair, Emily S, RPH, Stopped at 06/19/22 2327   norepinephrine (LEVOPHED) 16 mg in 242mL (0.064 mg/mL) premix infusion, 0-40 mcg/min, Intravenous, Titrated, Mannam, Praveen, MD, Last Rate: 23.4 mL/hr at 06/20/22 0600, 25 mcg/min at  06/20/22 0600   Oral care mouth rinse, 15 mL, Mouth Rinse, Q2H, Sood, Vineet, MD, 15 mL at 06/20/22 0515   Oral care mouth rinse, 15 mL, Mouth Rinse, PRN, Chesley Mires, MD   pantoprazole (PROTONIX) injection 40 mg, 40 mg, Intravenous, Q24H, Sood, Vineet, MD, 40 mg at 06/19/22 2200   propofol (DIPRIVAN) 1000 MG/100ML infusion, 0-50 mcg/kg/min, Intravenous, Continuous, Greta Doom, MD, Stopped at 06/18/22 1520   rifaximin (XIFAXAN) tablet 550 mg, 550 mg, Per Tube, BID, Brand Males, MD, 550 mg at 06/19/22 2125   sodium chloride flush (NS) 0.9 % injection 10-40 mL, 10-40 mL, Intracatheter, Q12H, Julian Hy, DO, 10 mL at 06/19/22 2113   sodium chloride flush (NS) 0.9 % injection 10-40 mL, 10-40 mL, Intracatheter, PRN, Noemi Chapel P, DO   thiamine (VITAMIN B1) injection 100 mg, 100 mg, Intravenous, Daily, Halford Chessman, Vineet, MD, 100 mg at 06/19/22 0925   white petrolatum (VASELINE) gel, , Topical, PRN, Julian Hy, DO, 1 Application at 73/71/06 0809  Current vital signs: BP (!) 122/51   Pulse (!) 118   Temp (!) 102.4 F (39.1 C) (Axillary)   Resp (!) 29   Wt 64.1 kg   SpO2 99%   BMI 24.26 kg/m  Vital signs in last 24 hours: Temp:  [98.1 F (36.7 C)-102.4 F (39.1 C)] 102.4 F (39.1 C) (11/29 0759) Pulse Rate:  [101-122] 118 (11/29 0900) Resp:  [20-37] 29 (11/29 0900) BP: (93-134)/(42-70) 122/51 (11/29 0900) SpO2:  [95 %-100 %] 99 % (11/29 0900) FiO2 (%):  [40 %] 40 % (11/29 0755) Weight:  [  64.1 kg] 64.1 kg (11/29 0500)   Physical Exam  Constitutional: Appears chronically ill  Psych: Minimally interactive Eyes: Scleral edema is minimal HENT: ET tube in place MSK: no joint deformities.  Cardiovascular: Mildly tachy Respiratory: Breathing comfortably on the ventilator,  GI: Mildly distended Skin: Scattered bruising throughout, some icterus   Neuro: Mental Status: Eyes open and blinking spontaneously but not tracking or following commands Cranial Nerves: II:  No blink to threat. Pupils are equal, round, and reactive to light.   III,IV, VI/VIII: EOMI suppressed to VOR, partial VOR to the right but not really to the left  V/VII: Facial sensation is symmetric to eyelash brush VIII: No response to voice X/XI: Intact cough XII: Unable to assess tongue protrusion secondary to patient's mental status  Motor/Sensory: Tone is normal. Bulk is normal. No movement of the bilateral upper extremities today, does move her head slightly to max noxious stim of the proximal LUE. Left lower extremity trace triple flexion distally, no movement with proximal noxious stim, no movement of RLE  Cerebellar: Unable to assess secondary to patient's mental status    Pertinent Labs:  Basic Metabolic Panel: Recent Labs  Lab 06/15/22 0403 06/15/22 1037 06/15/22 2052 06/15/22 2107 06/16/22 0335 06/16/22 0826 06/17/22 0253 06/18/22 0338 06/18/22 0527 06/18/22 0954 06/18/22 1531 06/19/22 0402 06/19/22 2025 06/20/22 0352  NA 153*   < > 150*   < > 148*   < > 141 137 140  --   --  142  --  143  K 4.5   < > 4.6   < > 4.7   < > 5.3* 2.7* 2.5* 2.6* 3.1* 3.1* 3.4* 3.9  CL 109  --  104  --  102   < > 94* 94* 96*  --   --  102  --  109  CO2 27  --  31  --  31   < > 26 24 24   --   --  23  --  21*  GLUCOSE 170*  --  129*  --  144*   < > 129* 144* 135*  --   --  147*  --  132*  BUN 57*  --  70*  --  76*   < > 74* 71* 67*  --   --  59*  --  71*  CREATININE 0.89  --  1.88*  --  2.30*   < > 2.61* 2.19* 2.08*  --   --  1.83*  --  1.83*  CALCIUM 9.9  --  9.3  --  9.1   < > 8.3* 7.4* 7.4*  --   --  7.3*  --  7.5*  MG 2.6*  --  3.0*  --  2.7*  --  2.8*  --   --   --   --  2.6*  --   --   PHOS 3.6  --   --   --  6.1*  --  8.1*  --   --   --   --   --   --   --    < > = values in this interval not displayed.     CBC: Recent Labs  Lab 06/14/22 0306 06/15/22 0403 06/15/22 1037 06/16/22 0335 06/17/22 0253 06/18/22 1531 06/19/22 0402 06/20/22 0352  WBC 10.8* 13.1*  --   20.6* 23.5* 16.5* 15.0* 16.6*  NEUTROABS 8.1* 9.6*  --   --   --   --  12.0* 13.2*  HGB  9.1* 10.5*   < > 10.2* 9.8* 8.7* 8.8* 8.4*  HCT 30.0* 32.6*   < > 32.7* 31.1* 26.7* 27.8* 26.5*  MCV 121.0* 119.0*  --  118.9* 118.7* 112.7* 115.4* 116.2*  PLT 216 277  --  361 324 255 242 224   < > = values in this interval not displayed.     Coagulation Studies: No results for input(s): "LABPROT", "INR" in the last 72 hours.   CSF  Latest Reference Range & Units 06/01/22 13:43 06/18/22 11:04  Appearance, CSF CLEAR  CLOUDY ! HAZY !  Glucose, CSF 40 - 70 mg/dL 86 (H) 94 (H) -- serum 193 48% of serum  RBC Count, CSF 0 /cu mm 26,400 (H) 5,000 (H)  WBC, CSF 0 - 5 /cu mm 80 (HH) 191 (HH)  Segmented Neutrophils-CSF 0 - 6 % 37 (H) 3  Lymphs, CSF 40 - 80 % 53 92 (H)  Monocyte-Macrophage-Spinal Fluid 15 - 45 % 9 (L) 5 (L)  Eosinophils, CSF 0 - 1 % 1 0  Color, CSF COLORLESS  RED ! AMBER !  Supernatant  XANTHOCHROMIC XANTHOCHROMIC  Total  Protein, CSF 15 - 45 mg/dL >600 (H) >600 (H)  (HH): Data is critically high !: Data is abnormal (H): Data is abnormally high (L): Data is abnormally low Opening pressure today 25, closing pressure 19  EEG  generalized continuous slowing previously, read today pending MRI brain w/o contrast 06/17/2022 personally reviewed, agree with radiology:   No acute intracranial pathology or epileptogenic focus identified.   MRI brain and C-spine 06/20/2022 1. Negative for acute infarct or mass. 2. Diffuse pachymeningeal enhancement and mild thickening. Sulcal hyperintensity on FLAIR has progressed in the interval. Mild pituitary enlargement for age unchanged. The mamillopontine distance is narrowed at 4.3 mm. Findings can be seen with intracranial hypotension however some of these findings can also be seen with meningitis. 3. No evidence of spinal infection in the cervical spine. Cervical spondylosis causing foraminal narrowing as described above.  CT abdomen pelvis w/o  contrast 06/15/22 Multiple dilated loops of small bowel with a transition zone in the mid to distal ileum consistent with a least partial small bowel obstruction. Bibasilar consolidation with left-sided pleural effusions stable from the prior CT. Cirrhotic changes of the liver with ascites. Ascites may also be in part due to the underlying obstructive change.     Assessment: Minimally interactive patient, despite improvement in her bowel function, ammonia, and stabilization of her AKI, she remains encephalopathic, with sedation held for 2 days at this time.   Notably the FLAIR hyperintensity can also be an artifactual finding in the setting of intubation although I do agree it appears to have progressed from prior scan, which coupled with worsening CSF pleocytosis is concerning for possible progressive infectious/inflammatory process, especially in the setting of her examination actually worsening since Monday despite sedation being held (with reduced movement of the bilateral upper extremities).  Also of note is the increasing intracranial pressure on the serial lumbar punctures she had, with initial opening pressure of 15 followed by opening pressure of 25 which is borderline elevated.  On further review, if the patient truly did stop drinking in early September it is also unclear why she had worsening liver function in October and raises the possibility that this was also an autoimmune process.  Her steroids were stopped on admission given the concern for infection and the fact that she had completed the normal course of steroids for alcoholic hepatitis.  Therefore it  is possible that her decompensation was in the setting of worsening autoimmune disease  Extended discussion with infectious disease and critical care team.  Appreciate Dr. Baxter Flattery assessment that patient has been adequately treated for Listeria at this time as well as Staph aureus bacteremia; overall risks of treatment with pulse dose  steroids is outweighed by the benefits at this time, but will pursue LP with flow and cytopathology prior to reinitiation of steroids to assess for lymphoma.  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 CSF contamination with blood, appreciate ID following), improving -Nonoliguric AKI likely secondary to ATN associated with shock, appreciate nephrology following -Alcoholic cirrhosis vs. Potential for autoimmune component  -Ileus/concern for partial small bowel obstruction, appreciate surgery following -Acute respiratory failure due to shock AKI and encephalopathy -Prolonged Qtc -Chronic anemia  Recommendations:  # Encephalopathy # CSF Inflammatory process, infectious vs. autoimmune - Repeat LP with IR    Opening pressure, drain to less then 20 mm or 50% of opening pressure which ever is higher Cell counts in tubes 1 and 4, protein, glucose, oligoclonal bands, IgG index, bacterial culture, fungal culture, anaerobic culture, VDRL, CSF flow cytometry, CSF cytopathology, autoimmune CSF encephalitis panel - Pending lumbar puncture completion will plan for pulse dose steroids 1 g daily for 5 days - Broad autoimmune workup, which I have asked infectious disease and CCM team to add to as needed: Lupus anticoagulant panel, ANA with reflex if positive, anti-Jo1, SSA, SSB, thyroid antibodies, ANCA profile, anti-smooth muscle antibody, mitochondrial antibodies, rheumatoid factor, anti-DS DNA, cardiolipin antibodies, oligoclonal bands, autoimmune serum encephalitis panel - Additional serological workup: TSH, free  T4, HIV, RPR - Consider CT chest abdomen pelvis for malignancy screening, renal function still precludes contrast at this time and so far she has had a recent abdomen/pelvis scan  # Clinical concern for seizure, seizure free on Keppra - Will follow-up continuous EEG read today and consider discontinuing - s/p Keppra 3500 mg load, then 1000 mg BID; given renal function and somnolence reduced to 500 mg BID on 11/27, will continue to adjust as needed, now room to increase to 750 mg every 12 hours IF she has any concern for breakthrough seizure activity going forward Estimated Creatinine Clearance: 31.1 mL/min (A) (by C-G formula based on SCr of 1.83 mg/dL (H)).   CrCl 80 to 130 mL/minute/1.73 m2: 500 mg to 1.5 g every 12 hours.  CrCl 50 to <80 mL/minute/1.73 m2: 500 mg to 1 g every 12 hours.  CrCl 30 to <50 mL/minute/1.73 m2: 250 to 750 mg every 12 hours.  CrCl 15 to <30 mL/minute/1.73 m2: 250 to 500 mg every 12 hours.  CrCl <15 mL/minute/1.73 m2: 250 to 500 mg every 24 hours (expert opinion).  Lesleigh Noe MD-PhD [facility] [phone]   CRITICAL CARE Performed by: Lorenza Chick   Total critical care time: 70 minutes  Critical care time was exclusive of separately billable procedures and treating other patients.  Critical care was necessary to treat or prevent imminent or life-threatening deterioration.  Critical care was time spent personally by me on the following activities: development of treatment plan with patient and/or surrogate as well as nursing, discussions  with consultants, evaluation of patient's response to treatment, examination of patient, obtaining history from patient or surrogate, ordering and performing treatments and interventions, ordering and review of laboratory studies, ordering and review of radiographic studies, pulse oximetry and re-evaluation of patient's condition.

## 2022-06-20 NOTE — Progress Notes (Incomplete)
Attending note: I have seen and examined the patient. History, labs and imaging reviewed.  51 Y/O alcohol cirrhosis admit with altered mental status and fevers, listeria meningitis and MSSA, MSSE bacteremia. Re intubated on 11/25 with seizures, obtundation. with increasing WBC counts. NG tube clamped. Febrile to 100.5 Remains on levophed  Blood pressure (!) 122/51, pulse (!) 118, temperature (!) 102.4 F (39.1 C), temperature source Axillary, resp. rate (!) 29, weight 64.1 kg, SpO2 99 %. Gen:      No acute distress HEENT:  EOMI, sclera anicteric Neck:     No masses; no thyromegaly, ETT Lungs:    Clear to auscultation bilaterally; normal respiratory effort CV:         Regular rate and rhythm; no murmurs Abd:      + bowel sounds; soft, non-tender; no palpable masses, no distension Ext:    No edema; adequate peripheral perfusion Skin:      Warm and dry; no rash Neuro: Sedated  Labs/Imaging personally reviewed, significant for BUN/Cr 71/1.83, WBC 16.8, Hb 8.4, PLts 224 MRI brain reviewed with flair signal of unclear etiology  Assessment/plan: Shock Septic vs circulatory Wean pressors as tolerated. Hold lasix and aldactone Antibiotics broadened to meropenem yesterday   Acute metabolic enchalopahty Continue keppra Lactulose. Rifaxamin on hold MRI reviewed For repeat LP today per neurology   Alcohol cirrhosis Supportive care  Will get GI involved as there is a question of autoimmune hepatitis   Ileus Surgery is following Start trickle feeds   Goals of care Consulted palliative as prognosis is poor with prolonged hoispitlization and co mobidities    The patient is critically ill with multiple organ systems failure and requires high complexity decision making for assessment and support, frequent evaluation and titration of therapies, application of advanced monitoring technologies and extensive interpretation of multiple databases.  Critical care time - 35 mins. This  represents my time independent of the NPs time taking care of the pt.  Marshell Garfinkel MD [facility] 06/20/2022, 10:10 AM

## 2022-06-20 NOTE — Procedures (Addendum)
Patient Name: Deanna Mcmillan  MRN: [id number]  Epilepsy Attending: Lora Havens  Referring Physician/Provider: Donnetta Simpers, MD  Duration: 06/20/2022 0513 to 06/19/2022 1122   Patient history: 51yo F with GTC seizure. EEG to evaluate for seizure.   Level of alertness: lethargic   AEDs during EEG study: LEV   Technical aspects: This EEG study was done with scalp electrodes positioned according to the 10-20 International system of electrode placement. Electrical activity was reviewed with band pass filter of 1-70Hz , sensitivity of 7 uV/mm, display speed of 71mm/sec with a 60Hz  notched filter applied as appropriate. EEG data were recorded continuously and digitally stored.  Video monitoring was available and reviewed as appropriate.   Description: EEG showed continuous generalized predominantly 5 to 7 Hz theta slowing admixed with intermittent generalized 2 to 3 Hz delta slowing, at times with triphasic morphology.  Hyperventilation and photic stimulation were not performed.      ABNORMALITY -Continuous slow, generalized   IMPRESSION: This study is suggestive of moderate to severe diffuse encephalopathy, nonspecific etiology but likely related to sedation. No seizures or epileptiform discharges were seen throughout the recording.   Sharanya Templin Barbra Sarks

## 2022-06-20 NOTE — Progress Notes (Signed)
Subjective/Chief Complaint: Tolerating clamping trials with minimal residual.  Some liquid stool in flex-seal not related to enemas.   Objective: Vital signs in last 24 hours: Temp:  [98.1 F (36.7 C)-102.4 F (39.1 C)] 102.4 F (39.1 C) (11/29 0759) Pulse Rate:  [101-122] 118 (11/29 0900) Resp:  [20-37] 29 (11/29 0900) BP: (93-138)/(42-70) 122/51 (11/29 0900) SpO2:  [70 %-100 %] 99 % (11/29 0900) FiO2 (%):  [40 %] 40 % (11/29 0755) Weight:  [64.1 kg] 64.1 kg (11/29 0500) Last BM Date : 06/19/22  Intake/Output from previous day: 11/28 0701 - 11/29 0700 In: 2456 [I.V.:650.7; NG/GT:120; IV Piggyback:785.3] Out: 2167 [Urine:742; Emesis/NG output:350; LXBWI:2035] Intake/Output this shift: Total I/O In: 23.3 [I.V.:23.3] Out: -   Abdomen: soft, seems only slightly distended.  NGT clamped with minimal residual  Lab Results:  Recent Labs    06/19/22 0402 06/20/22 0352  WBC 15.0* 16.6*  HGB 8.8* 8.4*  HCT 27.8* 26.5*  PLT 242 224   BMET Recent Labs    06/19/22 0402 06/19/22 2025 06/20/22 0352  NA 142  --  143  K 3.1* 3.4* 3.9  CL 102  --  109  CO2 23  --  21*  GLUCOSE 147*  --  132*  BUN 59*  --  71*  CREATININE 1.83*  --  1.83*  CALCIUM 7.3*  --  7.5*   PT/INR No results for input(s): "LABPROT", "INR" in the last 72 hours.  ABG No results for input(s): "PHART", "HCO3" in the last 72 hours.  Invalid input(s): "PCO2", "PO2"   Studies/Results: DG CHEST PORT 1 VIEW  Result Date: 06/19/2022 CLINICAL DATA:  Hypoxia EXAM: PORTABLE CHEST 1 VIEW COMPARISON:  06/15/2022 FINDINGS: Endotracheal tube seen 3.7 cm above the carina, withdrawn since prior examination. Nasogastric tube and nasoenteric feeding tube extending into the upper abdomen beyond the margin of the examination. Right upper extremity PICC line tip noted within the superior right atrium. Lung volumes are small, but are stable since prior examination. Partial right lower lobe collapse noted. Left  perihilar pulmonary infiltrate appears stable, asymmetric pulmonary edema versus infection. No pneumothorax or pleural effusion. Cardiac size within normal limits. IMPRESSION: 1. Endotracheal tube slightly withdrawn since prior examination, now 3.7 cm above the carina. Otherwise stable support lines and tubes. 2. Stable pulmonary hypoinflation with right lower lobe collapse. 3. Stable left perihilar pulmonary infiltrate, edema versus infection. Electronically Signed   By: Fidela Salisbury M.D.   On: 06/19/2022 15:23   MR BRAIN W WO CONTRAST  Result Date: 06/19/2022 CLINICAL DATA:  Altered mental status. Encephalopathy. Lumbar puncture of 06/18/2022. Rule out CSF leak/spontaneous intracranial hypotension EXAM: MRI HEAD WITHOUT AND WITH CONTRAST MRI CERVICAL SPINE WITHOUT AND WITH CONTRAST TECHNIQUE: Multiplanar, multiecho pulse sequences of the brain and surrounding structures, and cervical spine, to include the craniocervical junction and cervicothoracic junction, were obtained without and with intravenous contrast. CONTRAST:  7.23mL GADAVIST GADOBUTROL 1 MMOL/ML IV SOLN COMPARISON:  MRI head 06/17/2022 FINDINGS: MRI HEAD FINDINGS Brain: Negative for acute infarct or mass. No acute hemorrhage or fluid collection. Pituitary mildly enlarged for age measuring 8 mm in height. This is unchanged. Pituitary enhances homogeneously. There is hyperintensity in the sulci of FLAIR imaging which has progressed in the interval. Ventricles remain mildly enlarged and unchanged. Following contrast infusion, there is diffuse pack E meningeal enhancement and mild thickening. The mamillopontine distance is 4.3 mm which is narrowed. These findings can be seen with intracranial hypotension however can be also seen with meningitis.  Vascular: Normal arterial flow voids.  Normal venous enhancement Skull and upper cervical spine: No focal skeletal lesion. Sinuses/Orbits: Paranasal sinuses clear. Mild mastoid effusion bilaterally. Negative  orbit Other: None MRI CERVICAL SPINE FINDINGS Alignment: Mild anterolisthesis C2-3. Mild retrolisthesis C5-6. 3 mm retrolisthesis C6-7 Vertebrae: Negative for fracture or mass. No evidence of spinal infection or abscess. Cord: Normal signal and morphology Posterior Fossa, vertebral arteries, paraspinal tissues: The patient is intubated. NG tube in feeding tube in the esophagus. No mass, adenopathy, or fluid collection in the neck. Disc levels: C2-3: Negative C3-4: Disc degeneration with asymmetric spurring on the left. Moderate left foraminal narrowing. C4-5: Disc degeneration with mild uncinate spurring. Mild left foraminal narrowing C5-6: Disc degeneration with uncinate spurring. Moderate right foraminal narrowing and mild left foraminal narrowing C6-7: Mild disc degeneration and spurring without significant stenosis C7-T1 height negative IMPRESSION: 1. Negative for acute infarct or mass. 2. Diffuse pachymeningeal enhancement and mild thickening. Sulcal hyperintensity on FLAIR has progressed in the interval. Mild pituitary enlargement for age unchanged. The mamillopontine distance is narrowed at 4.3 mm. Findings can be seen with intracranial hypotension however some of these findings can also be seen with meningitis. 3. No evidence of spinal infection in the cervical spine. Cervical spondylosis causing foraminal narrowing as described above. Electronically Signed   By: Franchot Gallo M.D.   On: 06/19/2022 13:04   MR CERVICAL SPINE W WO CONTRAST  Result Date: 06/19/2022 CLINICAL DATA:  Altered mental status. Encephalopathy. Lumbar puncture of 06/18/2022. Rule out CSF leak/spontaneous intracranial hypotension EXAM: MRI HEAD WITHOUT AND WITH CONTRAST MRI CERVICAL SPINE WITHOUT AND WITH CONTRAST TECHNIQUE: Multiplanar, multiecho pulse sequences of the brain and surrounding structures, and cervical spine, to include the craniocervical junction and cervicothoracic junction, were obtained without and with  intravenous contrast. CONTRAST:  7.8mL GADAVIST GADOBUTROL 1 MMOL/ML IV SOLN COMPARISON:  MRI head 06/17/2022 FINDINGS: MRI HEAD FINDINGS Brain: Negative for acute infarct or mass. No acute hemorrhage or fluid collection. Pituitary mildly enlarged for age measuring 8 mm in height. This is unchanged. Pituitary enhances homogeneously. There is hyperintensity in the sulci of FLAIR imaging which has progressed in the interval. Ventricles remain mildly enlarged and unchanged. Following contrast infusion, there is diffuse pack E meningeal enhancement and mild thickening. The mamillopontine distance is 4.3 mm which is narrowed. These findings can be seen with intracranial hypotension however can be also seen with meningitis. Vascular: Normal arterial flow voids.  Normal venous enhancement Skull and upper cervical spine: No focal skeletal lesion. Sinuses/Orbits: Paranasal sinuses clear. Mild mastoid effusion bilaterally. Negative orbit Other: None MRI CERVICAL SPINE FINDINGS Alignment: Mild anterolisthesis C2-3. Mild retrolisthesis C5-6. 3 mm retrolisthesis C6-7 Vertebrae: Negative for fracture or mass. No evidence of spinal infection or abscess. Cord: Normal signal and morphology Posterior Fossa, vertebral arteries, paraspinal tissues: The patient is intubated. NG tube in feeding tube in the esophagus. No mass, adenopathy, or fluid collection in the neck. Disc levels: C2-3: Negative C3-4: Disc degeneration with asymmetric spurring on the left. Moderate left foraminal narrowing. C4-5: Disc degeneration with mild uncinate spurring. Mild left foraminal narrowing C5-6: Disc degeneration with uncinate spurring. Moderate right foraminal narrowing and mild left foraminal narrowing C6-7: Mild disc degeneration and spurring without significant stenosis C7-T1 height negative IMPRESSION: 1. Negative for acute infarct or mass. 2. Diffuse pachymeningeal enhancement and mild thickening. Sulcal hyperintensity on FLAIR has progressed in  the interval. Mild pituitary enlargement for age unchanged. The mamillopontine distance is narrowed at 4.3 mm. Findings can be seen  with intracranial hypotension however some of these findings can also be seen with meningitis. 3. No evidence of spinal infection in the cervical spine. Cervical spondylosis causing foraminal narrowing as described above. Electronically Signed   By: Franchot Gallo M.D.   On: 06/19/2022 13:04   DG Abd Portable 1V  Result Date: 06/19/2022 CLINICAL DATA:  Small-bowel obstruction EXAM: PORTABLE ABDOMEN - 1 VIEW COMPARISON:  06/18/2022 FINDINGS: Nasogastric tube tip in the antrum of the stomach. Soft feeding tube in the region of the pylorus or proximal duodenum. Persistent dilated small intestine, largest loop 6 cm. Some previously administered contrast present within the stomach and within the proximal small bowel IMPRESSION: Persistent small bowel obstruction pattern. Nasogastric tube tip in the antrum of the stomach. Soft feeding tube in the region of the pylorus or proximal duodenum. Electronically Signed   By: Nelson Chimes M.D.   On: 06/19/2022 09:51   DG FL GUIDED LUMBAR PUNCTURE  Result Date: 06/18/2022 CLINICAL DATA:  Listeria meningitis, encephalopathy EXAM: DIAGNOSTIC LUMBAR PUNCTURE UNDER FLUOROSCOPIC GUIDANCE COMPARISON:  06/01/22. FLUOROSCOPY: Radiation Exposure Index (as provided by the fluoroscopic device): 2.40 mGy Kerma PROCEDURE: Informed consent was obtained from the patient's mother prior to the procedure, including potential complications of headache, allergy, and pain. With the patient prone, the lower back was prepped with Betadine. 1% Lidocaine was used for local anesthesia. Lumbar puncture was performed at the L3-L4 level using a 20g gauge needle with return of yellow, clear CSF with an opening pressure of 25 cm water. 15 ml of CSF were obtained for laboratory studies. Closing pressure was measured at 19 cm water. The patient tolerated the procedure well  and there were no apparent complications. IMPRESSION: Successful lumbar puncture as described above. This exam was performed by Pasty Spillers, PA-C, and was supervised and interpreted by Lajean Manes, MD. Electronically Signed   By: Lajean Manes M.D.   On: 06/18/2022 11:15    Anti-infectives: Anti-infectives (From admission, onward)    Start     Dose/Rate Route Frequency Ordered Stop   06/20/22 0600  ceFAZolin (ANCEF) IVPB 2g/100 mL premix  Status:  Discontinued        2 g 200 mL/hr over 30 Minutes Intravenous Every 8 hours 06/15/22 1343 06/18/22 1632   06/19/22 1000  meropenem (MERREM) 2 g in sodium chloride 0.9 % 100 mL IVPB        2 g 280 mL/hr over 30 Minutes Intravenous Every 12 hours 06/19/22 0852     06/18/22 2000  meropenem (MERREM) 1 g in sodium chloride 0.9 % 100 mL IVPB  Status:  Discontinued        1 g 200 mL/hr over 30 Minutes Intravenous Every 12 hours 06/18/22 1632 06/19/22 0852   06/17/22 1400  ampicillin (OMNIPEN) 2 g in sodium chloride 0.9 % 100 mL IVPB  Status:  Discontinued        2 g 300 mL/hr over 20 Minutes Intravenous Every 8 hours 06/17/22 0731 06/18/22 1632   06/17/22 1100  rifaximin (XIFAXAN) tablet 550 mg        550 mg Per Tube 2 times daily 06/17/22 1000     06/16/22 1200  ampicillin (OMNIPEN) 2 g in sodium chloride 0.9 % 100 mL IVPB  Status:  Discontinued        2 g 300 mL/hr over 20 Minutes Intravenous Every 6 hours 06/16/22 0813 06/17/22 0731   06/15/22 1430  linezolid (ZYVOX) IVPB 600 mg  Status:  Discontinued  600 mg 300 mL/hr over 60 Minutes Intravenous Every 12 hours 06/15/22 1339 06/18/22 1632   06/12/22 2000  gentamicin (GARAMYCIN) IVPB 60 mg        60 mg 100 mL/hr over 30 Minutes Intravenous Every 24 hours 06/12/22 1146 06/15/22 2209   06/11/22 1230  rifaximin (XIFAXAN) tablet 550 mg  Status:  Discontinued        550 mg Per Tube 2 times daily 06/11/22 1135 06/16/22 0803   06/08/22 1845  gentamicin (GARAMYCIN) 20 mg in dextrose 5 % 50 mL  IVPB  Status:  Discontinued        20 mg 101 mL/hr over 30 Minutes Intravenous Every 12 hours 06/08/22 0753 06/12/22 1146   06/05/22 1845  gentamicin (GARAMYCIN) 40 mg in dextrose 5 % 50 mL IVPB  Status:  Discontinued        40 mg 102 mL/hr over 30 Minutes Intravenous Every 12 hours 06/05/22 0801 06/08/22 0753   06/02/22 1515  ceFAZolin (ANCEF) IVPB 2g/100 mL premix        2 g 200 mL/hr over 30 Minutes Intravenous Every 8 hours 06/02/22 1428 06/13/22 2154   06/01/22 1842  gentamicin (GARAMYCIN) IVPB 80 mg  Status:  Discontinued        80 mg 100 mL/hr over 30 Minutes Intravenous Every 12 hours 06/01/22 0800 06/05/22 0801   06/01/22 1205  vancomycin (VANCOCIN) IVPB 1000 mg/200 mL premix  Status:  Discontinued        1,000 mg 200 mL/hr over 60 Minutes Intravenous Every 12 hours 06/01/22 0839 06/02/22 1428   05/30/22 2300  vancomycin (VANCOREADY) IVPB 1250 mg/250 mL  Status:  Discontinued        1,250 mg 166.7 mL/hr over 90 Minutes Intravenous Every 24 hours 05/27/2022 2313 06/01/22 0839   05/30/22 2000  cefTRIAXone (ROCEPHIN) 2 g in sodium chloride 0.9 % 100 mL IVPB  Status:  Discontinued        2 g 200 mL/hr over 30 Minutes Intravenous Every 24 hours 05/30/22 1417 05/30/22 1443   05/30/22 2000  cefTRIAXone (ROCEPHIN) 2 g in sodium chloride 0.9 % 100 mL IVPB  Status:  Discontinued        2 g 200 mL/hr over 30 Minutes Intravenous Every 12 hours 05/30/22 1443 06/02/22 1428   05/30/22 1800  metroNIDAZOLE (FLAGYL) IVPB 500 mg  Status:  Discontinued        500 mg 100 mL/hr over 60 Minutes Intravenous Every 12 hours 05/30/22 1726 05/31/22 1155   05/30/22 1500  gentamicin (GARAMYCIN) 130 mg in dextrose 5 % 50 mL IVPB  Status:  Discontinued        5 mg/kg/day  77.1 kg 106.5 mL/hr over 30 Minutes Intravenous Every 8 hours 05/30/22 1359 06/01/22 0800   05/30/22 1330  ampicillin (OMNIPEN) 2 g in sodium chloride 0.9 % 100 mL IVPB  Status:  Discontinued        2 g 300 mL/hr over 20 Minutes Intravenous  Every 4 hours 05/30/22 1238 06/16/22 0813   05/30/22 0500  ceFEPIme (MAXIPIME) 2 g in sodium chloride 0.9 % 100 mL IVPB  Status:  Discontinued        2 g 200 mL/hr over 30 Minutes Intravenous Every 8 hours 06/02/2022 2313 05/30/22 1417   06/01/2022 2000  vancomycin (VANCOREADY) IVPB 1750 mg/350 mL        1,750 mg 175 mL/hr over 120 Minutes Intravenous  Once 06/21/2022 1952 05/30/22 0128   06/09/2022 2000  ceFEPIme (  MAXIPIME) 2 g in sodium chloride 0.9 % 100 mL IVPB  Status:  Discontinued        2 g 200 mL/hr over 30 Minutes Intravenous  Once 06/03/2022 1952 05/24/2022 2003   05/28/2022 1945  ceFEPIme (MAXIPIME) 2 g in sodium chloride 0.9 % 100 mL IVPB        2 g 200 mL/hr over 30 Minutes Intravenous  Once 06/20/2022 1938 06/09/2022 2126   05/30/2022 1945  metroNIDAZOLE (FLAGYL) IVPB 500 mg        500 mg 100 mL/hr over 60 Minutes Intravenous  Once 05/23/2022 1938 05/23/2022 2322   05/26/2022 1945  vancomycin (VANCOCIN) IVPB 1000 mg/200 mL premix  Status:  Discontinued        1,000 mg 200 mL/hr over 60 Minutes Intravenous  Once 06/21/2022 1938 05/24/2022 2003       Assessment/Plan: pSBO vs ileus -only has appy before  -tolerating NGT clamping trial.  Dc NGT and ok to try trickle TFs today.  Would not advance today, but if does well can start to adv per protocol tomorrow. -d/w primary service.  FEN - NPO, Cortrak, trickle TFs ok from surgery standpoint/IVFs VTE - heparin ID - ancef, ampicillin, zyvox  Henreitta Cea 06/20/2022

## 2022-06-20 NOTE — Procedures (Signed)
Technically successful fluoro guided LP at L3-4 level with opening pressure of 23 cm H2O and closing pressure of 12 cm H2O.  20 cc of hazy yellow CSF sent to lab for analysis.  No immediate post procedural complication.  Please see imaging section of Epic for full dictation.  Candiss Norse, PA-C

## 2022-06-20 NOTE — Progress Notes (Signed)
[facility]    Date of Admission:  06/10/2022   Total days of antibiotics 21   ID: Deanna Mcmillan is a 51 y.o. 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 Having ongoing fevers Principal Problem:   Acute respiratory failure with hypoxia (HCC) Active Problems:   Pressure injury of skin   Toxic metabolic encephalopathy   On mechanically assisted ventilation (HCC)   Sepsis with acute hypoxic respiratory failure (HCC)   Listeriosis   Acute pulmonary edema (HCC)   Septic shock (HCC)   Compromised airway   MSSA bacteremia   Small bowel obstruction (HCC)    Subjective: Febrile to 102.4 this morning.  Mother at bedside.  Remains intubated and minimally responsive.  Still on pressors.  Mild bump in white count from 15->16.6  Medications:   Chlorhexidine Gluconate Cloth  6 each Topical Daily   folic acid  1 mg Intravenous Daily   heparin  5,000 Units Subcutaneous Q8H   insulin aspart  0-15 Units Subcutaneous Q4H   lactulose  300 mL Rectal Daily   leptospermum manuka honey  1 Application Topical Daily   mouth rinse  15 mL Mouth Rinse Q2H   pantoprazole (PROTONIX) IV  40 mg Intravenous Q24H   rifaximin  550 mg Per Tube BID   sodium chloride flush  10-40 mL Intracatheter Q12H   thiamine (VITAMIN B1) injection  100 mg Intravenous Daily    Objective: Vital signs in last 24 hours: Temp:  [98.1 F (36.7 C)-102.4 F (39.1 C)] 99.2 F (37.3 C) (11/29 1124) Pulse Rate:  [103-122] 111 (11/29 1300) Resp:  [20-37] 26 (11/29 1300) BP: (93-133)/(42-60) 116/58 (11/29 1300) SpO2:  [95 %-100 %] 98 % (11/29 1300) FiO2 (%):  [40 %] 40 % (11/29 0755) Weight:  [64.1 kg] 64.1 kg (11/29 0500)  Physical Exam  General: Acutely ill middle-aged woman laying in bed. No acute distress. HEENT: ET tube stable in place. Mild conjunctival edema. CV: Tachycardic. Regular rhythm.  No  murmurs, rubs, or gallops. No LE edema Pulmonary: Mechanical lung sounds. Decreased air movement. Abdominal: Soft. Non-distended. Hypoactive bowel sounds. Extremities: Well-perfused.  Palpable pulses. Skin: Warm and dry.  Scattered bruises throughout. Neuro: Sedated.  No purposeful movement.  Does not follow commands.   Lab Results Recent Labs    06/19/22 0402 06/19/22 2025 06/20/22 0352  WBC 15.0*  --  16.6*  HGB 8.8*  --  8.4*  HCT 27.8*  --  26.5*  NA 142  --  143  K 3.1* 3.4* 3.9  CL 102  --  109  CO2 23  --  21*  BUN 59*  --  71*  CREATININE 1.83*  --  1.83*   Liver Panel Recent Labs    06/19/22 0402 06/20/22 0352 06/20/22 1141  PROT 6.8 6.4*  --   ALBUMIN 3.3* 2.9*  --   AST 42* 40  --   ALT 20 22  --   ALKPHOS 71 87  --   BILITOT 4.5* 4.9* 4.7*  BILIDIR  --   --  2.5*  IBILI  --   --  2.2*   Sedimentation Rate No results for input(s): "ESRSEDRATE" in the last 72 hours. C-Reactive Protein No results for input(s): "CRP" in the last 72 hours.  Microbiology: 11/24 >> blood culture NG after 5 days 11/25 >> tracheal aspirate rare Klebsiella oxytoca 11/27 >> CSF culture NGTD  Antibiotics:  11/28-c meropenem 11/20-c rifaximine 11/08-c amp 11/24-c linezolid   11/08-24 gentamicin 11/11-22 cefazolin 11/08-11 ceftriaxone 11/07-08 vanc/cefepime/flagyl  Studies/Results: DG CHEST PORT 1 VIEW  Result Date: 06/19/2022 CLINICAL DATA:  Hypoxia EXAM: PORTABLE CHEST 1 VIEW COMPARISON:  06/15/2022 FINDINGS: Endotracheal tube seen 3.7 cm above the carina, withdrawn since prior examination. Nasogastric tube and nasoenteric feeding tube extending into the upper abdomen beyond the margin of the examination. Right upper extremity PICC line tip noted within the superior right atrium. Lung volumes are small, but are stable since prior examination. Partial right lower lobe collapse noted. Left perihilar pulmonary infiltrate appears stable, asymmetric pulmonary edema versus  infection. No pneumothorax or pleural effusion. Cardiac size within normal limits. IMPRESSION: 1. Endotracheal tube slightly withdrawn since prior examination, now 3.7 cm above the carina. Otherwise stable support lines and tubes. 2. Stable pulmonary hypoinflation with right lower lobe collapse. 3. Stable left perihilar pulmonary infiltrate, edema versus infection. Electronically Signed   By: Fidela Salisbury M.D.   On: 06/19/2022 15:23   MR BRAIN W WO CONTRAST  Result Date: 06/19/2022 CLINICAL DATA:  Altered mental status. Encephalopathy. Lumbar puncture of 06/18/2022. Rule out CSF leak/spontaneous intracranial hypotension EXAM: MRI HEAD WITHOUT AND WITH CONTRAST MRI CERVICAL SPINE WITHOUT AND WITH CONTRAST TECHNIQUE: Multiplanar, multiecho pulse sequences of the brain and surrounding structures, and cervical spine, to include the craniocervical junction and cervicothoracic junction, were obtained without and with intravenous contrast. CONTRAST:  7.62mL GADAVIST GADOBUTROL 1 MMOL/ML IV SOLN COMPARISON:  MRI head 06/17/2022 FINDINGS: MRI HEAD FINDINGS Brain: Negative for acute infarct or mass. No acute hemorrhage or fluid collection. Pituitary mildly enlarged for age measuring 8 mm in height. This is unchanged. Pituitary enhances homogeneously. There is hyperintensity in the sulci of FLAIR imaging which has progressed in the interval. Ventricles remain mildly enlarged and unchanged. Following contrast infusion, there is diffuse pack E meningeal enhancement and mild thickening. The mamillopontine distance is 4.3 mm which is narrowed. These findings can be seen with intracranial hypotension however can be also seen with meningitis. Vascular: Normal arterial flow voids.  Normal venous enhancement Skull and upper cervical spine: No focal skeletal lesion. Sinuses/Orbits: Paranasal sinuses clear. Mild mastoid effusion bilaterally. Negative orbit Other: None MRI CERVICAL SPINE FINDINGS Alignment: Mild anterolisthesis  C2-3. Mild retrolisthesis C5-6. 3 mm retrolisthesis C6-7 Vertebrae: Negative for fracture or mass. No evidence of spinal infection or abscess. Cord: Normal signal and morphology Posterior Fossa, vertebral arteries, paraspinal tissues: The patient is intubated. NG tube in feeding tube in the esophagus. No mass, adenopathy, or fluid collection in the neck. Disc levels: C2-3: Negative C3-4: Disc degeneration with asymmetric spurring on the left. Moderate left foraminal narrowing. C4-5: Disc degeneration with mild uncinate spurring. Mild left foraminal narrowing C5-6: Disc degeneration with uncinate spurring. Moderate right foraminal narrowing and mild left foraminal narrowing C6-7: Mild disc degeneration and spurring without significant stenosis C7-T1 height negative IMPRESSION: 1. Negative for acute infarct or mass. 2. Diffuse pachymeningeal enhancement and mild thickening. Sulcal hyperintensity on FLAIR has progressed in the interval. Mild pituitary enlargement for age unchanged. The mamillopontine distance is narrowed at 4.3 mm. Findings can be seen with intracranial hypotension however some of these findings can also be seen with meningitis. 3. No evidence of spinal infection in the cervical spine. Cervical spondylosis causing foraminal narrowing as described above. Electronically Signed   By: Franchot Gallo M.D.   On: 06/19/2022 13:04   MR CERVICAL SPINE W WO CONTRAST  Result Date: 06/19/2022 CLINICAL DATA:  Altered mental status. Encephalopathy. Lumbar puncture of 06/18/2022. Rule out CSF leak/spontaneous intracranial hypotension EXAM: MRI HEAD WITHOUT AND WITH CONTRAST MRI CERVICAL SPINE WITHOUT AND WITH CONTRAST TECHNIQUE: Multiplanar, multiecho pulse sequences of the brain and surrounding structures, and cervical spine, to include the craniocervical junction and cervicothoracic junction, were obtained without and with intravenous contrast. CONTRAST:  7.18mL GADAVIST GADOBUTROL 1 MMOL/ML IV SOLN COMPARISON:   MRI head 06/17/2022 FINDINGS: MRI HEAD FINDINGS Brain: Negative for acute infarct or mass. No acute hemorrhage or fluid collection. Pituitary mildly enlarged for age measuring 8 mm in height. This is unchanged. Pituitary enhances homogeneously. There is hyperintensity in the sulci of FLAIR imaging which has progressed in the interval. Ventricles remain mildly enlarged and unchanged. Following contrast infusion, there is diffuse pack E meningeal enhancement and mild thickening. The mamillopontine distance is 4.3 mm which is narrowed. These findings can be seen with intracranial hypotension however can be also seen with meningitis. Vascular: Normal arterial flow voids.  Normal venous enhancement Skull and upper cervical spine: No focal skeletal lesion. Sinuses/Orbits: Paranasal sinuses clear. Mild mastoid effusion bilaterally. Negative orbit Other: None MRI CERVICAL SPINE FINDINGS Alignment: Mild anterolisthesis C2-3. Mild retrolisthesis C5-6. 3 mm retrolisthesis C6-7 Vertebrae: Negative for fracture or mass. No evidence of spinal infection or abscess. Cord: Normal signal and morphology Posterior Fossa, vertebral arteries, paraspinal tissues: The patient is intubated. NG tube in feeding tube in the esophagus. No mass, adenopathy, or fluid collection in the neck. Disc levels: C2-3: Negative C3-4: Disc degeneration with asymmetric spurring on the left. Moderate left foraminal narrowing. C4-5: Disc degeneration with mild uncinate spurring. Mild left foraminal narrowing C5-6: Disc degeneration with uncinate spurring. Moderate right foraminal narrowing and mild left foraminal narrowing C6-7: Mild disc degeneration and spurring without significant stenosis C7-T1 height negative IMPRESSION: 1. Negative for acute infarct or mass. 2. Diffuse pachymeningeal enhancement and mild thickening. Sulcal hyperintensity on FLAIR has progressed in the interval. Mild pituitary enlargement for age unchanged. The mamillopontine distance is  narrowed at 4.3 mm. Findings can be seen with intracranial hypotension however some of these findings can also be seen with meningitis. 3. No evidence of spinal infection in the cervical spine. Cervical spondylosis causing foraminal narrowing as described above. Electronically Signed   By: Franchot Gallo M.D.   On: 06/19/2022 13:04   DG Abd Portable 1V  Result Date: 06/19/2022 CLINICAL DATA:  Small-bowel obstruction EXAM: PORTABLE ABDOMEN - 1 VIEW COMPARISON:  06/18/2022 FINDINGS: Nasogastric tube tip in the antrum of the stomach. Soft feeding tube in the region of the pylorus or proximal duodenum. Persistent dilated small intestine, largest loop 6 cm. Some previously administered contrast present within the stomach and within the proximal small bowel IMPRESSION: Persistent small bowel obstruction pattern. Nasogastric tube tip in the antrum of the stomach. Soft feeding tube in the region of the pylorus or proximal duodenum. Electronically Signed   By: Nelson Chimes M.D.   On: 06/19/2022 09:51     Assessment/Plan: CERENITI CURB is a 51 y.o. 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  #Persistent encephalopathy/fevers #Listeria bacteremia/meningitis/MSSA bacteremia Repeat brain MRI shows progression of the sulcal hyperintensity on FLAIR as well as meningeal enhancement and mild thickening. She continues to have intermittent fevers but now uptrending of leukocytosis today. Last CSF study shows clearance of Listeria, negative for cryptococcus. Analysis showed lymphocytic predominance pleocytosis, culture no growth x 2 days. In the setting of pt decompensating over the last few  days while on broad-spectrum antibiotics and recent seizure episode, discussed case with neuro with plan to initiate workup for possible autoimmune encephalopathy.  -Repeat LP for autoimmune CSF encephalitis panel -Check opening pressure, protein, glucose, oligoclonal  bands, IgG index, bacterial culture, fungal culture, anaerobic culture, VDRL, CSF flow cytometry, CSF cytopathology, autoimmune CSF encephalitis panel  -Autoimmune workup with lupus anticoagulant panel, ANA, anti-Jo1, SSA, SSB, thyroid antibodies, ANCA profile, ASMA, AMA, rheumatoid factor, anti-dsDNA, cardiolipin antibodies, oligoclonal bands, autoimmune serum encephalitis panel  -Continue meropenem -Trend fever curve/WBC  #Hepatic encephalopathy #Alcoholic hepatitis #Concern for autoimmune hepatitis Ammonia level significantly improved.  -Continue rifaximin -Autoimmune workup as above  #Kleb oxytoca on trach aspirate Repeat chest x-ray shows persistent perihilar pulmonary infiltrates. Remains febrile with mild bump in white count. Remains on ventilator support. -Continue meropenem -Check procalcitonin  #Septic shock/AHRF: Per PCCM #SBO/ileus: CCS following #Seizures: Neurology following   ID will continue to follow  Lacinda Axon, MD 06/20/2022, 1:29 PM IM Resident, PGY-3 Oswaldo Milian 41:[id number]

## 2022-06-21 ENCOUNTER — Inpatient Hospital Stay (HOSPITAL_COMMUNITY): Payer: 59

## 2022-06-21 DIAGNOSIS — J9601 Acute respiratory failure with hypoxia: Secondary | ICD-10-CM | POA: Diagnosis not present

## 2022-06-21 LAB — CSF CULTURE W GRAM STAIN: Culture: NO GROWTH

## 2022-06-21 LAB — COMPREHENSIVE METABOLIC PANEL
ALT: 20 U/L (ref 0–44)
AST: 47 U/L — ABNORMAL HIGH (ref 15–41)
Albumin: 2.8 g/dL — ABNORMAL LOW (ref 3.5–5.0)
Alkaline Phosphatase: 81 U/L (ref 38–126)
Anion gap: 18 — ABNORMAL HIGH (ref 5–15)
BUN: 82 mg/dL — ABNORMAL HIGH (ref 6–20)
CO2: 21 mmol/L — ABNORMAL LOW (ref 22–32)
Calcium: 8.3 mg/dL — ABNORMAL LOW (ref 8.9–10.3)
Chloride: 111 mmol/L (ref 98–111)
Creatinine, Ser: 1.99 mg/dL — ABNORMAL HIGH (ref 0.44–1.00)
GFR, Estimated: 30 mL/min — ABNORMAL LOW (ref >60)
Glucose, Bld: 210 mg/dL — ABNORMAL HIGH (ref 70–99)
Potassium: 3.5 mmol/L (ref 3.5–5.1)
Sodium: 150 mmol/L — ABNORMAL HIGH (ref 135–145)
Total Bilirubin: 4.7 mg/dL — ABNORMAL HIGH (ref 0.3–1.2)
Total Protein: 6.3 g/dL — ABNORMAL LOW (ref 6.5–8.1)

## 2022-06-21 LAB — CARDIOLIPIN ANTIBODIES, IGG, IGM, IGA
Anticardiolipin IgA: <9 APL U/mL (ref 0–11)
Anticardiolipin IgG: <9 GPL U/mL (ref 0–14)
Anticardiolipin IgM: 25 MPL U/mL — ABNORMAL HIGH (ref 0–12)

## 2022-06-21 LAB — CBC WITH DIFFERENTIAL/PLATELET
Abs Immature Granulocytes: 0.25 10*3/uL — ABNORMAL HIGH (ref 0.00–0.07)
Basophils Absolute: 0 10*3/uL (ref 0.0–0.1)
Basophils Relative: 0 %
Eosinophils Absolute: 0 10*3/uL (ref 0.0–0.5)
Eosinophils Relative: 0 %
HCT: 25.6 % — ABNORMAL LOW (ref 36.0–46.0)
Hemoglobin: 8.2 g/dL — ABNORMAL LOW (ref 12.0–15.0)
Immature Granulocytes: 2 %
Lymphocytes Relative: 9 %
Lymphs Abs: 1.2 10*3/uL (ref 0.7–4.0)
MCH: 36.8 pg — ABNORMAL HIGH (ref 26.0–34.0)
MCHC: 32 g/dL (ref 30.0–36.0)
MCV: 114.8 fL — ABNORMAL HIGH (ref 80.0–100.0)
Monocytes Absolute: 0.2 10*3/uL (ref 0.1–1.0)
Monocytes Relative: 1 %
Neutro Abs: 12.3 10*3/uL — ABNORMAL HIGH (ref 1.7–7.7)
Neutrophils Relative %: 88 %
Platelets: 187 10*3/uL (ref 150–400)
RBC: 2.23 MIL/uL — ABNORMAL LOW (ref 3.87–5.11)
RDW: 16 % — ABNORMAL HIGH (ref 11.5–15.5)
Smear Review: NORMAL
WBC: 14 10*3/uL — ABNORMAL HIGH (ref 4.0–10.5)
nRBC: 0.4 % — ABNORMAL HIGH (ref 0.0–0.2)

## 2022-06-21 LAB — EXTRACTABLE NUCLEAR ANTIGEN ANTIBODY
ENA SM Ab Ser-aCnc: <0.2 AI (ref 0.0–0.9)
Ribonucleic Protein: 0.3 AI (ref 0.0–0.9)
SSA (Ro) (ENA) Antibody, IgG: <0.2 AI (ref 0.0–0.9)
SSB (La) (ENA) Antibody, IgG: <0.2 AI (ref 0.0–0.9)
Scleroderma (Scl-70) (ENA) Antibody, IgG: <0.2 AI (ref 0.0–0.9)
ds DNA Ab: <1 IU/mL (ref 0–9)

## 2022-06-21 LAB — GLUCOSE, CAPILLARY
Glucose-Capillary: 195 mg/dL — ABNORMAL HIGH (ref 70–99)
Glucose-Capillary: 202 mg/dL — ABNORMAL HIGH (ref 70–99)
Glucose-Capillary: 216 mg/dL — ABNORMAL HIGH (ref 70–99)
Glucose-Capillary: 219 mg/dL — ABNORMAL HIGH (ref 70–99)
Glucose-Capillary: 224 mg/dL — ABNORMAL HIGH (ref 70–99)
Glucose-Capillary: 250 mg/dL — ABNORMAL HIGH (ref 70–99)

## 2022-06-21 LAB — ANTI-SMOOTH MUSCLE ANTIBODY, IGG: F-Actin IgG: 8 Units (ref 0–19)

## 2022-06-21 LAB — CYTOLOGY - NON PAP

## 2022-06-21 LAB — MITOCHONDRIAL ANTIBODIES: Mitochondrial M2 Ab, IgG: <20 Units (ref 0.0–20.0)

## 2022-06-21 LAB — THYROID ANTIBODIES
Thyroglobulin Antibody: <1 IU/mL (ref 0.0–0.9)
Thyroperoxidase Ab SerPl-aCnc: 22 IU/mL (ref 0–34)

## 2022-06-21 LAB — ANTI-JO 1 ANTIBODY, IGG: Anti JO-1: <0.2 AI (ref 0.0–0.9)

## 2022-06-21 LAB — ANCA PROFILE
Anti-MPO Antibodies: <0.2 units (ref 0.0–0.9)
Anti-PR3 Antibodies: <0.2 units (ref 0.0–0.9)
Atypical P-ANCA titer: <1:20 {titer}
C-ANCA: <1:20 {titer}
P-ANCA: <1:20 {titer}

## 2022-06-21 LAB — RPR: RPR Ser Ql: NONREACTIVE

## 2022-06-21 MED ORDER — FREE WATER
200.0000 mL | Status: DC
  Administered 2022-06-21 – 2022-06-25 (×23): 200 mL

## 2022-06-21 MED ORDER — INSULIN ASPART 100 UNIT/ML IJ SOLN
0.0000 [IU] | INTRAMUSCULAR | Status: DC
  Administered 2022-06-21 – 2022-06-22 (×5): 7 [IU] via SUBCUTANEOUS
  Administered 2022-06-22: 4 [IU] via SUBCUTANEOUS
  Administered 2022-06-22 (×3): 7 [IU] via SUBCUTANEOUS
  Administered 2022-06-22: 4 [IU] via SUBCUTANEOUS
  Administered 2022-06-23 (×5): 7 [IU] via SUBCUTANEOUS
  Administered 2022-06-23: 11 [IU] via SUBCUTANEOUS
  Administered 2022-06-23 – 2022-06-24 (×2): 7 [IU] via SUBCUTANEOUS
  Administered 2022-06-24 (×4): 11 [IU] via SUBCUTANEOUS
  Administered 2022-06-24 – 2022-06-25 (×3): 7 [IU] via SUBCUTANEOUS
  Administered 2022-06-25: 6 [IU] via SUBCUTANEOUS
  Administered 2022-06-25: 7 [IU] via SUBCUTANEOUS
  Administered 2022-06-25 (×2): 11 [IU] via SUBCUTANEOUS
  Administered 2022-06-26: 4 [IU] via SUBCUTANEOUS
  Administered 2022-06-26: 3 [IU] via SUBCUTANEOUS
  Administered 2022-06-26 (×2): 4 [IU] via SUBCUTANEOUS
  Administered 2022-06-26 – 2022-06-27 (×3): 7 [IU] via SUBCUTANEOUS
  Administered 2022-06-27: 4 [IU] via SUBCUTANEOUS
  Administered 2022-06-27 (×3): 7 [IU] via SUBCUTANEOUS
  Administered 2022-06-28 (×2): 3 [IU] via SUBCUTANEOUS
  Administered 2022-06-28 (×2): 4 [IU] via SUBCUTANEOUS
  Administered 2022-06-29: 3 [IU] via SUBCUTANEOUS
  Administered 2022-06-29: 4 [IU] via SUBCUTANEOUS
  Administered 2022-06-29 (×2): 3 [IU] via SUBCUTANEOUS
  Administered 2022-06-29 (×2): 4 [IU] via SUBCUTANEOUS
  Administered 2022-06-30 – 2022-07-03 (×12): 3 [IU] via SUBCUTANEOUS

## 2022-06-21 MED ORDER — LACTULOSE 10 GM/15ML PO SOLN
20.0000 g | Freq: Three times a day (TID) | ORAL | Status: DC
  Administered 2022-06-21 – 2022-06-24 (×9): 20 g
  Filled 2022-06-21 (×9): qty 30

## 2022-06-21 MED ORDER — LEVETIRACETAM 500 MG PO TABS
500.0000 mg | ORAL_TABLET | Freq: Two times a day (BID) | ORAL | Status: DC
  Administered 2022-06-21 – 2022-06-29 (×16): 500 mg
  Filled 2022-06-21 (×17): qty 1

## 2022-06-21 MED ORDER — FOLIC ACID 1 MG PO TABS
1.0000 mg | ORAL_TABLET | Freq: Every day | ORAL | Status: DC
  Administered 2022-06-22 – 2022-07-04 (×13): 1 mg
  Filled 2022-06-21 (×13): qty 1

## 2022-06-21 MED ORDER — PROSOURCE TF20 ENFIT COMPATIBL EN LIQD
60.0000 mL | Freq: Every day | ENTERAL | Status: DC
  Administered 2022-06-21 – 2022-07-04 (×14): 60 mL
  Filled 2022-06-21 (×14): qty 60

## 2022-06-21 MED ORDER — VITAL 1.5 CAL PO LIQD
1000.0000 mL | ORAL | Status: DC
  Administered 2022-06-22 – 2022-07-03 (×12): 1000 mL
  Filled 2022-06-21 (×2): qty 1000

## 2022-06-21 MED ORDER — MIDODRINE HCL 5 MG PO TABS
10.0000 mg | ORAL_TABLET | Freq: Three times a day (TID) | ORAL | Status: DC
  Administered 2022-06-22 – 2022-07-04 (×37): 10 mg
  Filled 2022-06-21 (×37): qty 2

## 2022-06-21 MED ORDER — POTASSIUM CHLORIDE 20 MEQ PO PACK
40.0000 meq | PACK | Freq: Once | ORAL | Status: AC
  Administered 2022-06-21: 40 meq via ORAL
  Filled 2022-06-21: qty 2

## 2022-06-21 MED ORDER — INSULIN ASPART 100 UNIT/ML IJ SOLN
2.0000 [IU] | INTRAMUSCULAR | Status: DC
  Administered 2022-06-21 – 2022-06-22 (×5): 2 [IU] via SUBCUTANEOUS

## 2022-06-21 MED ORDER — THIAMINE MONONITRATE 100 MG PO TABS
100.0000 mg | ORAL_TABLET | Freq: Every day | ORAL | Status: DC
  Administered 2022-06-22 – 2022-07-04 (×12): 100 mg
  Filled 2022-06-21 (×12): qty 1

## 2022-06-21 MED ORDER — FREE WATER
100.0000 mL | Status: DC
  Administered 2022-06-21: 100 mL

## 2022-06-21 MED ORDER — MIDODRINE HCL 5 MG PO TABS
10.0000 mg | ORAL_TABLET | Freq: Three times a day (TID) | ORAL | Status: DC
  Administered 2022-06-21 (×2): 10 mg via ORAL
  Filled 2022-06-21 (×2): qty 2

## 2022-06-21 NOTE — Progress Notes (Signed)
RT transported patient to CT and back to 2M11 without event. Patient suctioned prior to and after transport per VAP protocol.

## 2022-06-21 NOTE — Progress Notes (Signed)
[facility] Progress Note     Subjective: CC-  NG out >24hr and tolerating trickle tube feedings without n/v. Continues to pass liquid stool.  Objective: Vital signs in last 24 hours: Temp:  [98.4 F (36.9 C)-101.1 F (38.4 C)] 99.9 F (37.7 C) (11/30 0829) Pulse Rate:  [96-122] 99 (11/30 0645) Resp:  [14-31] 25 (11/30 0645) BP: (108-136)/(44-66) 117/52 (11/30 0752) SpO2:  [95 %-100 %] 96 % (11/30 0645) FiO2 (%):  [40 %] 40 % (11/30 0859) Weight:  [66.7 kg] 66.7 kg (11/30 0100) Last BM Date : 06/20/22  Intake/Output from previous day: 11/29 0701 - 11/30 0700 In: 1405.6 [I.V.:586.3; NG/GT:273.3; IV Piggyback:545.9] Out: 1225 [Urine:795; Emesis/NG output:80; Stool:350] Intake/Output this shift: No intake/output data recorded.  PE: Gen: on the vent Abd: soft, slightly distended, nontender, few bowel sounds heard, thin brown stool in flexiseal  Lab Results:  Recent Labs    06/20/22 0352 06/21/22 0409  WBC 16.6* 14.0*  HGB 8.4* 8.2*  HCT 26.5* 25.6*  PLT 224 187   BMET Recent Labs    06/20/22 0352 06/21/22 0409  NA 143 150*  K 3.9 3.5  CL 109 111  CO2 21* 21*  GLUCOSE 132* 210*  BUN 71* 82*  CREATININE 1.83* 1.99*  CALCIUM 7.5* 8.3*   PT/INR No results for input(s): "LABPROT", "INR" in the last 72 hours. CMP     Component Value Date/Time   NA 150 (H) 06/21/2022 0409   K 3.5 06/21/2022 0409   CL 111 06/21/2022 0409   CO2 21 (L) 06/21/2022 0409   GLUCOSE 210 (H) 06/21/2022 0409   BUN 82 (H) 06/21/2022 0409   CREATININE 1.99 (H) 06/21/2022 0409   CALCIUM 8.3 (L) 06/21/2022 0409   PROT 6.3 (L) 06/21/2022 0409   ALBUMIN 2.8 (L) 06/21/2022 0409   AST 47 (H) 06/21/2022 0409   ALT 20 06/21/2022 0409   ALKPHOS 81 06/21/2022 0409   BILITOT 4.7 (H) 06/21/2022 0409   GFRNONAA 30 (L) 06/21/2022 0409   GFRAA >90 04/12/2013 0633   Lipase     Component Value Date/Time   LIPASE 73 (H) 06/14/2022 1452       Studies/Results: DG FL GUIDED  LUMBAR PUNCTURE  Result Date: 06/20/2022 CLINICAL DATA:  Patient with listeria meningitis, encephalopathy, 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 Previous fluoroscopically guided lumbar puncture 06/01/22, 06/18/22. Request for repeat lumbar puncture to further evaluate. EXAM: LUMBAR PUNCTURE UNDER FLUOROSCOPY PROCEDURE: An appropriate skin entry site was determined fluoroscopically. Operator donned sterile gloves and mask. Skin site was marked, then prepped with Betadine, draped in usual sterile fashion, and infiltrated locally with 1% lidocaine. A 20 gauge spinal needle advanced into the thecal sac at L3-4 level. Hazy yellow CSF spontaneously returned, with opening pressure of 23 cm water. 20 ml CSF were collected and divided among 4 sterile vials for the requested laboratory studies. Closing pressure 12 cm water The needle was then removed. The patient tolerated the procedure well and there were no complications. FLUOROSCOPY: Radiation Exposure Index (as provided by the fluoroscopic device): 8.10 mGy Kerma IMPRESSION: Technically successful lumbar puncture under fluoroscopy. This exam was performed by Candiss Norse, PA-C, and was supervised and interpreted by Nelson Chimes, MD. Electronically Signed   By: Nelson Chimes M.D.   On: 06/20/2022 14:15   DG CHEST PORT 1 VIEW  Result Date: 06/19/2022 CLINICAL DATA:  Hypoxia EXAM: PORTABLE CHEST 1 VIEW COMPARISON:  06/15/2022 FINDINGS: Endotracheal tube seen 3.7 cm above the carina, withdrawn since prior examination.  Nasogastric tube and nasoenteric feeding tube extending into the upper abdomen beyond the margin of the examination. Right upper extremity PICC line tip noted within the superior right atrium. Lung volumes are small, but are stable since prior examination. Partial right lower lobe collapse noted. Left perihilar pulmonary infiltrate appears stable, asymmetric pulmonary edema versus infection. No pneumothorax or pleural effusion.  Cardiac size within normal limits. IMPRESSION: 1. Endotracheal tube slightly withdrawn since prior examination, now 3.7 cm above the carina. Otherwise stable support lines and tubes. 2. Stable pulmonary hypoinflation with right lower lobe collapse. 3. Stable left perihilar pulmonary infiltrate, edema versus infection. Electronically Signed   By: Fidela Salisbury M.D.   On: 06/19/2022 15:23   MR BRAIN W WO CONTRAST  Result Date: 06/19/2022 CLINICAL DATA:  Altered mental status. Encephalopathy. Lumbar puncture of 06/18/2022. Rule out CSF leak/spontaneous intracranial hypotension EXAM: MRI HEAD WITHOUT AND WITH CONTRAST MRI CERVICAL SPINE WITHOUT AND WITH CONTRAST TECHNIQUE: Multiplanar, multiecho pulse sequences of the brain and surrounding structures, and cervical spine, to include the craniocervical junction and cervicothoracic junction, were obtained without and with intravenous contrast. CONTRAST:  7.22mL GADAVIST GADOBUTROL 1 MMOL/ML IV SOLN COMPARISON:  MRI head 06/17/2022 FINDINGS: MRI HEAD FINDINGS Brain: Negative for acute infarct or mass. No acute hemorrhage or fluid collection. Pituitary mildly enlarged for age measuring 8 mm in height. This is unchanged. Pituitary enhances homogeneously. There is hyperintensity in the sulci of FLAIR imaging which has progressed in the interval. Ventricles remain mildly enlarged and unchanged. Following contrast infusion, there is diffuse pack E meningeal enhancement and mild thickening. The mamillopontine distance is 4.3 mm which is narrowed. These findings can be seen with intracranial hypotension however can be also seen with meningitis. Vascular: Normal arterial flow voids.  Normal venous enhancement Skull and upper cervical spine: No focal skeletal lesion. Sinuses/Orbits: Paranasal sinuses clear. Mild mastoid effusion bilaterally. Negative orbit Other: None MRI CERVICAL SPINE FINDINGS Alignment: Mild anterolisthesis C2-3. Mild retrolisthesis C5-6. 3 mm retrolisthesis  C6-7 Vertebrae: Negative for fracture or mass. No evidence of spinal infection or abscess. Cord: Normal signal and morphology Posterior Fossa, vertebral arteries, paraspinal tissues: The patient is intubated. NG tube in feeding tube in the esophagus. No mass, adenopathy, or fluid collection in the neck. Disc levels: C2-3: Negative C3-4: Disc degeneration with asymmetric spurring on the left. Moderate left foraminal narrowing. C4-5: Disc degeneration with mild uncinate spurring. Mild left foraminal narrowing C5-6: Disc degeneration with uncinate spurring. Moderate right foraminal narrowing and mild left foraminal narrowing C6-7: Mild disc degeneration and spurring without significant stenosis C7-T1 height negative IMPRESSION: 1. Negative for acute infarct or mass. 2. Diffuse pachymeningeal enhancement and mild thickening. Sulcal hyperintensity on FLAIR has progressed in the interval. Mild pituitary enlargement for age unchanged. The mamillopontine distance is narrowed at 4.3 mm. Findings can be seen with intracranial hypotension however some of these findings can also be seen with meningitis. 3. No evidence of spinal infection in the cervical spine. Cervical spondylosis causing foraminal narrowing as described above. Electronically Signed   By: Franchot Gallo M.D.   On: 06/19/2022 13:04   MR CERVICAL SPINE W WO CONTRAST  Result Date: 06/19/2022 CLINICAL DATA:  Altered mental status. Encephalopathy. Lumbar puncture of 06/18/2022. Rule out CSF leak/spontaneous intracranial hypotension EXAM: MRI HEAD WITHOUT AND WITH CONTRAST MRI CERVICAL SPINE WITHOUT AND WITH CONTRAST TECHNIQUE: Multiplanar, multiecho pulse sequences of the brain and surrounding structures, and cervical spine, to include the craniocervical junction and cervicothoracic junction, were obtained without and with  intravenous contrast. CONTRAST:  7.34mL GADAVIST GADOBUTROL 1 MMOL/ML IV SOLN COMPARISON:  MRI head 06/17/2022 FINDINGS: MRI HEAD FINDINGS  Brain: Negative for acute infarct or mass. No acute hemorrhage or fluid collection. Pituitary mildly enlarged for age measuring 8 mm in height. This is unchanged. Pituitary enhances homogeneously. There is hyperintensity in the sulci of FLAIR imaging which has progressed in the interval. Ventricles remain mildly enlarged and unchanged. Following contrast infusion, there is diffuse pack E meningeal enhancement and mild thickening. The mamillopontine distance is 4.3 mm which is narrowed. These findings can be seen with intracranial hypotension however can be also seen with meningitis. Vascular: Normal arterial flow voids.  Normal venous enhancement Skull and upper cervical spine: No focal skeletal lesion. Sinuses/Orbits: Paranasal sinuses clear. Mild mastoid effusion bilaterally. Negative orbit Other: None MRI CERVICAL SPINE FINDINGS Alignment: Mild anterolisthesis C2-3. Mild retrolisthesis C5-6. 3 mm retrolisthesis C6-7 Vertebrae: Negative for fracture or mass. No evidence of spinal infection or abscess. Cord: Normal signal and morphology Posterior Fossa, vertebral arteries, paraspinal tissues: The patient is intubated. NG tube in feeding tube in the esophagus. No mass, adenopathy, or fluid collection in the neck. Disc levels: C2-3: Negative C3-4: Disc degeneration with asymmetric spurring on the left. Moderate left foraminal narrowing. C4-5: Disc degeneration with mild uncinate spurring. Mild left foraminal narrowing C5-6: Disc degeneration with uncinate spurring. Moderate right foraminal narrowing and mild left foraminal narrowing C6-7: Mild disc degeneration and spurring without significant stenosis C7-T1 height negative IMPRESSION: 1. Negative for acute infarct or mass. 2. Diffuse pachymeningeal enhancement and mild thickening. Sulcal hyperintensity on FLAIR has progressed in the interval. Mild pituitary enlargement for age unchanged. The mamillopontine distance is narrowed at 4.3 mm. Findings can be seen with  intracranial hypotension however some of these findings can also be seen with meningitis. 3. No evidence of spinal infection in the cervical spine. Cervical spondylosis causing foraminal narrowing as described above. Electronically Signed   By: Franchot Gallo M.D.   On: 06/19/2022 13:04   DG Abd Portable 1V  Result Date: 06/19/2022 CLINICAL DATA:  Small-bowel obstruction EXAM: PORTABLE ABDOMEN - 1 VIEW COMPARISON:  06/18/2022 FINDINGS: Nasogastric tube tip in the antrum of the stomach. Soft feeding tube in the region of the pylorus or proximal duodenum. Persistent dilated small intestine, largest loop 6 cm. Some previously administered contrast present within the stomach and within the proximal small bowel IMPRESSION: Persistent small bowel obstruction pattern. Nasogastric tube tip in the antrum of the stomach. Soft feeding tube in the region of the pylorus or proximal duodenum. Electronically Signed   By: Nelson Chimes M.D.   On: 06/19/2022 09:51    Anti-infectives: Anti-infectives (From admission, onward)    Start     Dose/Rate Route Frequency Ordered Stop   06/20/22 0600  ceFAZolin (ANCEF) IVPB 2g/100 mL premix  Status:  Discontinued        2 g 200 mL/hr over 30 Minutes Intravenous Every 8 hours 06/15/22 1343 06/18/22 1632   06/19/22 1000  meropenem (MERREM) 2 g in sodium chloride 0.9 % 100 mL IVPB        2 g 280 mL/hr over 30 Minutes Intravenous Every 12 hours 06/19/22 0852     06/18/22 2000  meropenem (MERREM) 1 g in sodium chloride 0.9 % 100 mL IVPB  Status:  Discontinued        1 g 200 mL/hr over 30 Minutes Intravenous Every 12 hours 06/18/22 1632 06/19/22 0852   06/17/22 1400  ampicillin (OMNIPEN) 2 g  in sodium chloride 0.9 % 100 mL IVPB  Status:  Discontinued        2 g 300 mL/hr over 20 Minutes Intravenous Every 8 hours 06/17/22 0731 06/18/22 1632   06/17/22 1100  rifaximin (XIFAXAN) tablet 550 mg        550 mg Per Tube 2 times daily 06/17/22 1000     06/16/22 1200  ampicillin  (OMNIPEN) 2 g in sodium chloride 0.9 % 100 mL IVPB  Status:  Discontinued        2 g 300 mL/hr over 20 Minutes Intravenous Every 6 hours 06/16/22 0813 06/17/22 0731   06/15/22 1430  linezolid (ZYVOX) IVPB 600 mg  Status:  Discontinued        600 mg 300 mL/hr over 60 Minutes Intravenous Every 12 hours 06/15/22 1339 06/18/22 1632   06/12/22 2000  gentamicin (GARAMYCIN) IVPB 60 mg        60 mg 100 mL/hr over 30 Minutes Intravenous Every 24 hours 06/12/22 1146 06/15/22 2209   06/11/22 1230  rifaximin (XIFAXAN) tablet 550 mg  Status:  Discontinued        550 mg Per Tube 2 times daily 06/11/22 1135 06/16/22 0803   06/08/22 1845  gentamicin (GARAMYCIN) 20 mg in dextrose 5 % 50 mL IVPB  Status:  Discontinued        20 mg 101 mL/hr over 30 Minutes Intravenous Every 12 hours 06/08/22 0753 06/12/22 1146   06/05/22 1845  gentamicin (GARAMYCIN) 40 mg in dextrose 5 % 50 mL IVPB  Status:  Discontinued        40 mg 102 mL/hr over 30 Minutes Intravenous Every 12 hours 06/05/22 0801 06/08/22 0753   06/02/22 1515  ceFAZolin (ANCEF) IVPB 2g/100 mL premix        2 g 200 mL/hr over 30 Minutes Intravenous Every 8 hours 06/02/22 1428 06/13/22 2154   06/01/22 1842  gentamicin (GARAMYCIN) IVPB 80 mg  Status:  Discontinued        80 mg 100 mL/hr over 30 Minutes Intravenous Every 12 hours 06/01/22 0800 06/05/22 0801   06/01/22 1205  vancomycin (VANCOCIN) IVPB 1000 mg/200 mL premix  Status:  Discontinued        1,000 mg 200 mL/hr over 60 Minutes Intravenous Every 12 hours 06/01/22 0839 06/02/22 1428   05/30/22 2300  vancomycin (VANCOREADY) IVPB 1250 mg/250 mL  Status:  Discontinued        1,250 mg 166.7 mL/hr over 90 Minutes Intravenous Every 24 hours 06/06/2022 2313 06/01/22 0839   05/30/22 2000  cefTRIAXone (ROCEPHIN) 2 g in sodium chloride 0.9 % 100 mL IVPB  Status:  Discontinued        2 g 200 mL/hr over 30 Minutes Intravenous Every 24 hours 05/30/22 1417 05/30/22 1443   05/30/22 2000  cefTRIAXone (ROCEPHIN) 2  g in sodium chloride 0.9 % 100 mL IVPB  Status:  Discontinued        2 g 200 mL/hr over 30 Minutes Intravenous Every 12 hours 05/30/22 1443 06/02/22 1428   05/30/22 1800  metroNIDAZOLE (FLAGYL) IVPB 500 mg  Status:  Discontinued        500 mg 100 mL/hr over 60 Minutes Intravenous Every 12 hours 05/30/22 1726 05/31/22 1155   05/30/22 1500  gentamicin (GARAMYCIN) 130 mg in dextrose 5 % 50 mL IVPB  Status:  Discontinued        5 mg/kg/day  77.1 kg 106.5 mL/hr over 30 Minutes Intravenous Every 8 hours 05/30/22 1359  06/01/22 0800   05/30/22 1330  ampicillin (OMNIPEN) 2 g in sodium chloride 0.9 % 100 mL IVPB  Status:  Discontinued        2 g 300 mL/hr over 20 Minutes Intravenous Every 4 hours 05/30/22 1238 06/16/22 0813   05/30/22 0500  ceFEPIme (MAXIPIME) 2 g in sodium chloride 0.9 % 100 mL IVPB  Status:  Discontinued        2 g 200 mL/hr over 30 Minutes Intravenous Every 8 hours 05/30/2022 2313 05/30/22 1417   06/12/2022 2000  vancomycin (VANCOREADY) IVPB 1750 mg/350 mL        1,750 mg 175 mL/hr over 120 Minutes Intravenous  Once 06/17/2022 1952 05/30/22 0128   06/21/2022 2000  ceFEPIme (MAXIPIME) 2 g in sodium chloride 0.9 % 100 mL IVPB  Status:  Discontinued        2 g 200 mL/hr over 30 Minutes Intravenous  Once 06/21/2022 1952 05/24/2022 2003   05/31/2022 1945  ceFEPIme (MAXIPIME) 2 g in sodium chloride 0.9 % 100 mL IVPB        2 g 200 mL/hr over 30 Minutes Intravenous  Once 06/14/2022 1938 05/25/2022 2126   06/15/2022 1945  metroNIDAZOLE (FLAGYL) IVPB 500 mg        500 mg 100 mL/hr over 60 Minutes Intravenous  Once 06/04/2022 1938 06/21/2022 2322   06/15/2022 1945  vancomycin (VANCOCIN) IVPB 1000 mg/200 mL premix  Status:  Discontinued        1,000 mg 200 mL/hr over 60 Minutes Intravenous  Once 05/27/2022 1938 06/01/2022 2003        Assessment/Plan pSBO vs ileus -only has appy before  -Tolerating trickle tube feedings and continues to have bowel function. Gradually advance tube feedings to goal as  tolerated. Ok to use Cortrak for medications. Discussed with primary team. We will sign off, please call with questions or concerns.   FEN - NPO, Cortrak, advance TF to goal VTE - heparin ID - merrem  I reviewed last 24 h vitals and pain scores, last 48 h intake and output, last 24 h labs and trends    LOS: 23 days    Wellington Hampshire, [facility] 06/21/2022, 9:11 AM Please see Amion for pager number during day hours 7:00am-4:30pm

## 2022-06-21 NOTE — Progress Notes (Signed)
[facility]    Date of Admission:  06/10/2022   Total days of antibiotics 21   ID: Deanna Mcmillan is a 51 y.o. 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 Having ongoing fevers  Principal Problem:   Acute respiratory failure with hypoxia (HCC) Active Problems:   Pressure injury of skin   Toxic metabolic encephalopathy   On mechanically assisted ventilation (HCC)   Sepsis with acute hypoxic respiratory failure (HCC)   Listeriosis   Acute pulmonary edema (HCC)   Septic shock (HCC)   Compromised airway   MSSA bacteremia   Small bowel obstruction (HCC)   Subjective: Started pulsed dose steroids last night Fever of 101.1 overnight. Remains somnolent, still not following commands Pressor requirements coming down White count trending down  Medications:   Chlorhexidine Gluconate Cloth  6 each Topical Daily   folic acid  1 mg Intravenous Daily   heparin  5,000 Units Subcutaneous Q8H   insulin aspart  0-20 Units Subcutaneous Q4H   lactulose  20 g Per Tube TID   leptospermum manuka honey  1 Application Topical Daily   mouth rinse  15 mL Mouth Rinse Q2H   pantoprazole (PROTONIX) IV  40 mg Intravenous Q24H   rifaximin  550 mg Per Tube BID   sodium chloride flush  10-40 mL Intracatheter Q12H   thiamine (VITAMIN B1) injection  100 mg Intravenous Daily    Objective: Vital signs in last 24 hours: Temp:  [98.4 F (36.9 C)-101.1 F (38.4 C)] 99.9 F (37.7 C) (11/30 0829) Pulse Rate:  [96-122] 99 (11/30 0645) Resp:  [14-31] 25 (11/30 0645) BP: (108-136)/(44-66) 117/52 (11/30 0752) SpO2:  [95 %-100 %] 96 % (11/30 0645) FiO2 (%):  [40 %] 40 % (11/30 0859) Weight:  [66.7 kg] 66.7 kg (11/30 0100)  Physical Exam  General: Acutely ill middle-aged woman laying in bed. No acute distress. HEENT: ET tube stable in place. Mild conjunctival edema, improving CV:  Tachycardic. Regular rhythm.  No murmurs, rubs, or gallops. No LE edema Pulmonary: Mechanical lung sounds. Decreased air movement. Abdominal: Soft. Non-distended. Hypoactive bowel sounds. Extremities: Well-perfused. Palpable pulses. Skin: Warm and dry. Scattered ecchymosis, BUE > BLE Neuro: Off sedation. Somnolent, but opens eyes spontaneously. Still does not follow commands.   Lab Results Recent Labs    06/20/22 0352 06/21/22 0409  WBC 16.6* 14.0*  HGB 8.4* 8.2*  HCT 26.5* 25.6*  NA 143 150*  K 3.9 3.5  CL 109 111  CO2 21* 21*  BUN 71* 82*  CREATININE 1.83* 1.99*   Liver Panel Recent Labs    06/20/22 0352 06/20/22 1141 06/21/22 0409  PROT 6.4*  --  6.3*  ALBUMIN 2.9*  --  2.8*  AST 40  --  47*  ALT 22  --  20  ALKPHOS 87  --  81  BILITOT 4.9* 4.7* 4.7*  BILIDIR  --  2.5*  --   IBILI  --  2.2*  --    Sedimentation Rate No results for input(s): "ESRSEDRATE" in the last 72 hours. C-Reactive Protein No results for input(s): "CRP" in the last 72 hours.  Microbiology: 11/24 >> blood culture NG after 5 days 11/25 >> tracheal aspirate rare Klebsiella oxytoca 11/27 >> CSF culture NGTD  Antibiotics:  11/28-c meropenem 11/20-c rifaximine 11/08-c amp 11/24-c linezolid   11/08-24 gentamicin 11/11-22 cefazolin 11/08-11 ceftriaxone 11/07-08 vanc/cefepime/flagyl  Studies/Results: DG FL GUIDED LUMBAR  PUNCTURE  Result Date: 06/20/2022 CLINICAL DATA:  Patient with listeria meningitis, encephalopathy, 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 Previous fluoroscopically guided lumbar puncture 06/01/22, 06/18/22. Request for repeat lumbar puncture to further evaluate. EXAM: LUMBAR PUNCTURE UNDER FLUOROSCOPY PROCEDURE: An appropriate skin entry site was determined fluoroscopically. Operator donned sterile gloves and mask. Skin site was marked, then prepped with Betadine, draped in usual sterile fashion, and infiltrated locally with 1% lidocaine. A 20 gauge  spinal needle advanced into the thecal sac at L3-4 level. Hazy yellow CSF spontaneously returned, with opening pressure of 23 cm water. 20 ml CSF were collected and divided among 4 sterile vials for the requested laboratory studies. Closing pressure 12 cm water The needle was then removed. The patient tolerated the procedure well and there were no complications. FLUOROSCOPY: Radiation Exposure Index (as provided by the fluoroscopic device): 8.10 mGy Kerma IMPRESSION: Technically successful lumbar puncture under fluoroscopy. This exam was performed by Candiss Norse, PA-C, and was supervised and interpreted by Nelson Chimes, MD. Electronically Signed   By: Nelson Chimes M.D.   On: 06/20/2022 14:15   DG CHEST PORT 1 VIEW  Result Date: 06/19/2022 CLINICAL DATA:  Hypoxia EXAM: PORTABLE CHEST 1 VIEW COMPARISON:  06/15/2022 FINDINGS: Endotracheal tube seen 3.7 cm above the carina, withdrawn since prior examination. Nasogastric tube and nasoenteric feeding tube extending into the upper abdomen beyond the margin of the examination. Right upper extremity PICC line tip noted within the superior right atrium. Lung volumes are small, but are stable since prior examination. Partial right lower lobe collapse noted. Left perihilar pulmonary infiltrate appears stable, asymmetric pulmonary edema versus infection. No pneumothorax or pleural effusion. Cardiac size within normal limits. IMPRESSION: 1. Endotracheal tube slightly withdrawn since prior examination, now 3.7 cm above the carina. Otherwise stable support lines and tubes. 2. Stable pulmonary hypoinflation with right lower lobe collapse. 3. Stable left perihilar pulmonary infiltrate, edema versus infection. Electronically Signed   By: Fidela Salisbury M.D.   On: 06/19/2022 15:23   MR BRAIN W WO CONTRAST  Result Date: 06/19/2022 CLINICAL DATA:  Altered mental status. Encephalopathy. Lumbar puncture of 06/18/2022. Rule out CSF leak/spontaneous intracranial hypotension  EXAM: MRI HEAD WITHOUT AND WITH CONTRAST MRI CERVICAL SPINE WITHOUT AND WITH CONTRAST TECHNIQUE: Multiplanar, multiecho pulse sequences of the brain and surrounding structures, and cervical spine, to include the craniocervical junction and cervicothoracic junction, were obtained without and with intravenous contrast. CONTRAST:  7.63mL GADAVIST GADOBUTROL 1 MMOL/ML IV SOLN COMPARISON:  MRI head 06/17/2022 FINDINGS: MRI HEAD FINDINGS Brain: Negative for acute infarct or mass. No acute hemorrhage or fluid collection. Pituitary mildly enlarged for age measuring 8 mm in height. This is unchanged. Pituitary enhances homogeneously. There is hyperintensity in the sulci of FLAIR imaging which has progressed in the interval. Ventricles remain mildly enlarged and unchanged. Following contrast infusion, there is diffuse pack E meningeal enhancement and mild thickening. The mamillopontine distance is 4.3 mm which is narrowed. These findings can be seen with intracranial hypotension however can be also seen with meningitis. Vascular: Normal arterial flow voids.  Normal venous enhancement Skull and upper cervical spine: No focal skeletal lesion. Sinuses/Orbits: Paranasal sinuses clear. Mild mastoid effusion bilaterally. Negative orbit Other: None MRI CERVICAL SPINE FINDINGS Alignment: Mild anterolisthesis C2-3. Mild retrolisthesis C5-6. 3 mm retrolisthesis C6-7 Vertebrae: Negative for fracture or mass. No evidence of spinal infection or abscess. Cord: Normal signal and morphology Posterior Fossa, vertebral arteries, paraspinal tissues: The patient is intubated. NG tube in feeding tube in the esophagus. No  mass, adenopathy, or fluid collection in the neck. Disc levels: C2-3: Negative C3-4: Disc degeneration with asymmetric spurring on the left. Moderate left foraminal narrowing. C4-5: Disc degeneration with mild uncinate spurring. Mild left foraminal narrowing C5-6: Disc degeneration with uncinate spurring. Moderate right foraminal  narrowing and mild left foraminal narrowing C6-7: Mild disc degeneration and spurring without significant stenosis C7-T1 height negative IMPRESSION: 1. Negative for acute infarct or mass. 2. Diffuse pachymeningeal enhancement and mild thickening. Sulcal hyperintensity on FLAIR has progressed in the interval. Mild pituitary enlargement for age unchanged. The mamillopontine distance is narrowed at 4.3 mm. Findings can be seen with intracranial hypotension however some of these findings can also be seen with meningitis. 3. No evidence of spinal infection in the cervical spine. Cervical spondylosis causing foraminal narrowing as described above. Electronically Signed   By: Franchot Gallo M.D.   On: 06/19/2022 13:04   MR CERVICAL SPINE W WO CONTRAST  Result Date: 06/19/2022 CLINICAL DATA:  Altered mental status. Encephalopathy. Lumbar puncture of 06/18/2022. Rule out CSF leak/spontaneous intracranial hypotension EXAM: MRI HEAD WITHOUT AND WITH CONTRAST MRI CERVICAL SPINE WITHOUT AND WITH CONTRAST TECHNIQUE: Multiplanar, multiecho pulse sequences of the brain and surrounding structures, and cervical spine, to include the craniocervical junction and cervicothoracic junction, were obtained without and with intravenous contrast. CONTRAST:  7.64mL GADAVIST GADOBUTROL 1 MMOL/ML IV SOLN COMPARISON:  MRI head 06/17/2022 FINDINGS: MRI HEAD FINDINGS Brain: Negative for acute infarct or mass. No acute hemorrhage or fluid collection. Pituitary mildly enlarged for age measuring 8 mm in height. This is unchanged. Pituitary enhances homogeneously. There is hyperintensity in the sulci of FLAIR imaging which has progressed in the interval. Ventricles remain mildly enlarged and unchanged. Following contrast infusion, there is diffuse pack E meningeal enhancement and mild thickening. The mamillopontine distance is 4.3 mm which is narrowed. These findings can be seen with intracranial hypotension however can be also seen with  meningitis. Vascular: Normal arterial flow voids.  Normal venous enhancement Skull and upper cervical spine: No focal skeletal lesion. Sinuses/Orbits: Paranasal sinuses clear. Mild mastoid effusion bilaterally. Negative orbit Other: None MRI CERVICAL SPINE FINDINGS Alignment: Mild anterolisthesis C2-3. Mild retrolisthesis C5-6. 3 mm retrolisthesis C6-7 Vertebrae: Negative for fracture or mass. No evidence of spinal infection or abscess. Cord: Normal signal and morphology Posterior Fossa, vertebral arteries, paraspinal tissues: The patient is intubated. NG tube in feeding tube in the esophagus. No mass, adenopathy, or fluid collection in the neck. Disc levels: C2-3: Negative C3-4: Disc degeneration with asymmetric spurring on the left. Moderate left foraminal narrowing. C4-5: Disc degeneration with mild uncinate spurring. Mild left foraminal narrowing C5-6: Disc degeneration with uncinate spurring. Moderate right foraminal narrowing and mild left foraminal narrowing C6-7: Mild disc degeneration and spurring without significant stenosis C7-T1 height negative IMPRESSION: 1. Negative for acute infarct or mass. 2. Diffuse pachymeningeal enhancement and mild thickening. Sulcal hyperintensity on FLAIR has progressed in the interval. Mild pituitary enlargement for age unchanged. The mamillopontine distance is narrowed at 4.3 mm. Findings can be seen with intracranial hypotension however some of these findings can also be seen with meningitis. 3. No evidence of spinal infection in the cervical spine. Cervical spondylosis causing foraminal narrowing as described above. Electronically Signed   By: Franchot Gallo M.D.   On: 06/19/2022 13:04   DG Abd Portable 1V  Result Date: 06/19/2022 CLINICAL DATA:  Small-bowel obstruction EXAM: PORTABLE ABDOMEN - 1 VIEW COMPARISON:  06/18/2022 FINDINGS: Nasogastric tube tip in the antrum of the stomach. Soft feeding tube  in the region of the pylorus or proximal duodenum. Persistent  dilated small intestine, largest loop 6 cm. Some previously administered contrast present within the stomach and within the proximal small bowel IMPRESSION: Persistent small bowel obstruction pattern. Nasogastric tube tip in the antrum of the stomach. Soft feeding tube in the region of the pylorus or proximal duodenum. Electronically Signed   By: Nelson Chimes M.D.   On: 06/19/2022 09:51     Assessment/Plan: BEXLEIGH THERIAULT is a 51 y.o. 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  #Persistent encephalopathy/fevers #Listeria bacteremia/meningitis/MSSA bacteremia Repeat brain MRI shows progression of the sulcal hyperintensity on FLAIR as well as meningeal enhancement and mild thickening. Leukocytosis improved however she fevered again this morning. Mental status still not significantly improved. Will continue broad-spectrum antibiotics with meropenem for now until further improvement in mental status before de-escalating. Repeat CSF study continues to show a lymphocytic pleocytosis, CSF culture NGTD. Further CSF studies still pending. -F/u autoimmune CSF encephalitis panel -F/u oligoclonal bands, IgG index, bacterial culture, fungal culture, anaerobic culture, VDRL, CSF flow cytometry, CSF cytopathology, autoimmune CSF encephalitis panel  -Continue meropenem -Continue pulse dose steroids -Trend fever curve/WBC  #Hepatic encephalopathy #Alcoholic hepatitis #Concern for autoimmune hepatitis Autoimmune workup so far unremarkable with negative ASMA, AMA, ANA, anti-Jo 1 Ab, thyroid Ab, extractable nuclear antigen ab, RPR, HIV. Low concern for primary biliary cirrhosis, autoimmune hepatitis, polymyositis, thyroid dysfunction or sjogren's syndrome -Continue rifaximin and lactulose -F/u lupus anticoagulant panel, ANA, ANCA profile, IgG, anti-dsDNA, cardiolipin antibodies, oligoclonal bands, autoimmune serum encephalitis panel   #Kleb oxytoca on  trach aspirate Repeat chest x-ray shows persistent perihilar pulmonary infiltrates.  Continues to remain on full vent support. -Continue meropenem -Check procalcitonin -Follow-up CT chest  #Septic shock/AHRF: Per PCCM #SBO/ileus: CCS following #Seizures: Neurology following  ID will continue to follow  Lacinda Axon, MD 06/21/2022, 9:10 AM IM Resident, PGY-3 [facility]al[facility] Milian 41:10

## 2022-06-21 NOTE — Progress Notes (Signed)
Nutrition Follow-up  DOCUMENTATION CODES:  Not applicable  INTERVENTION:  Tube feeding cortrak tube: Vital 1.5 @ 51m/h (1.32L/d) Advance from 20 to 365mand then advance 1070m12h to goal Prosource TF 1x/d Provides 2060kcal, 109g protein, and 1008m96m free water Thiamine and folic acid daily for hx of EtOH abuse  NUTRITION DIAGNOSIS:  Inadequate oral intake related to inability to eat as evidenced by NPO status. - remains applicable  GOAL:  Patient will meet greater than or equal to 90% of their needs - progressing, being met with TF  MONITOR:  Vent status, Labs, TF tolerance  REASON FOR ASSESSMENT:  Ventilator    ASSESSMENT:  Pt with hx of EtOH abuse with cirrhosis presented to ED after being found unresponsive.   11/7 - intubated and admitted to ICU 11/15 - cortrak placed 11/22 - extubated 11/23 - NGT placed for suction for ileus, TF via cortrak held 11/24 - reintubated 11/29 - LP  Pt able to have trickle feeds initiated yesterday and showing good tolerance. Per surgery, ok to use cortrak tube for meds and can slowly start advancing feeds.   MV: 11.1 L/min Temp (24hrs), Avg:99.9 F (37.7 C), Min:98.4 F (36.9 C), Max:101.1 F (38.4 C)  Intake/Output Summary (Last 24 hours) at 06/21/2022 0841 Last data filed at 06/21/2022 0640 Gross per 24 hour  Intake 1382.23 ml  Output 1145 ml  Net 237.23 ml  Net IO Since Admission: -1,387.85 mL [06/21/22 0841]  Nutritionally Relevant Medications: Scheduled Meds:  folic acid  1 mg Intravenous Daily   insulin aspart  0-20 Units Subcutaneous Q4H   lactulose  300 mL Rectal Daily   pantoprazole (PROTONIX) IV  40 mg Intravenous Q24H   rifaximin  550 mg Per Tube BID   thiamine (VITAMIN B1) injection  100 mg Intravenous Daily   Continuous Infusions:  feeding supplement (VITAL 1.5 CAL) 20 mL/hr at 06/21/22 0600   methylPREDNISolone (SOLU-MEDROL) injection     norepinephrine (LEVOPHED) Adult infusion 12 mcg/min (06/21/22  0600)   Labs Reviewed: Na 150 BUN 82, 1.99 creatinine CBG ranges from 127-224 mg/dL over the last 24 hours  Micronutrient Labs: Folate 31.2 Vitamin B12 1,454  NUTRITION - FOCUSED PHYSICAL EXAM: Flowsheet Row Most Recent Value  Orbital Region No depletion  Upper Arm Region No depletion  Thoracic and Lumbar Region No depletion  Buccal Region No depletion  Temple Region No depletion  Clavicle Bone Region No depletion  Clavicle and Acromion Bone Region No depletion  Scapular Bone Region No depletion  Dorsal Hand No depletion  Patellar Region No depletion  Anterior Thigh Region No depletion  Posterior Calf Region No depletion  Edema (RD Assessment) Moderate  [pitting to BLE and BUE]  Hair Reviewed  Eyes Reviewed  Mouth Reviewed  Skin Reviewed  Nails Reviewed   Diet Order:   Diet Order             Diet NPO time specified  Diet effective now                  EDUCATION NEEDS:  Not appropriate for education at this time  Skin:  Skin Assessment: Reviewed RN Assessment  Last BM:  11/29 - type 7 Rectal tube: 350mL68m in 24 hours  Height:  Ht Readings from Last 1 Encounters:  05/10/22 _0  (1.626 m)   Weight:  Wt Readings from Last 1 Encounters:  06/21/22 66.7 kg    Ideal Body Weight:  54.5 kg  BMI:  Body mass  index is 25.24 kg/m.  Estimated Nutritional Needs:  Kcal:  1800-2000 kcal/d Protein:  90-110 g/d Fluid:  2L/d   Ranell Patrick, RD, LDN Clinical Dietitian RD pager # available in [facility]  After hours/weekend pager # available in [facility]

## 2022-06-21 NOTE — Progress Notes (Signed)
NAME:  Deanna Mcmillan, MRN:  [id number], DOB:  11/09/1970, LOS: 39 ADMISSION DATE:  06/12/2022, CONSULTATION DATE:  11/7 REFERRING MD:  Lendon Colonel, EDP CHIEF COMPLAINT:  AMS   History of Present Illness:  51 yo woman with hx of recent diagnosis of alcoholic hepatitis (dx 09/62), here with ams, fever.   History from [name].  Patient was in her normal state of health, until suddenly the night PTA she developed a headache.  She went to sleep to rest.  The following day she slept throughout the day, that evening her mother went to wake her and was unable to wake her up, so she called 911.     She has been doing well since her recent hospital discharge, taking all the meds as prescribed, no new meds.  Planned follow up with GI on 11/14.  LE edema had been improving.  No cough, sob no focal symptoms.  No known sick contacts.     In ED, febrile, tachycardic, lethargic, desaturating to 70s.   Intubated by ED physician.    Had been admitted earlier in October and started on course of prenisone for alc hepatitis.    Recently seen at [facility] 10/19: LE Edema. Lasix not helpful at home.  Admitted for diuresis.  Jaundice and abd distension also noted.  Pleural effusion.  GI saw: non enough ascites for tap  Started lasix and spironolactone.  28 day course of prednisolone, miralax, lactulose, miralax. Midodrine TID    In ED got cefepime, LR 1L, flagyl ordered, intubated, started on Propofol, vanc ordered   Pertinent  Medical History  ETOH Cirrhosis BLE edema recently worsening  S/p lap appy 2014    Echo: recent normal [id number]   Home meds: folic ascid, acetaminohpen, lasix 40bid, lactulose 30 TID protonix, prednisone (04/29/22), prednisolone 04/28/22 three week course, thiamine, simethicone,    Per her mother she had been drinking at least one bottle of wine daily, she isnt clear exactly how much she was drinking but she did also drink [place].  She notes she had wanted to stop recently but  been unable to.  She thinks she was depressed after experiencing perimenopause.  No tobacco.   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 11/18 more awake, following commands 1121 RUE PICC 11/22 extubated to BiPAP 11/23 - 14d of Rx complerted for MSSA/MESSE. For listeria - ID wants to extend amp/gent to 3 weeks 11/8-11/29. Ileus post extubation and NG to LIS .  increased. BP sofft an dlevophed and alb Rx.   11/24 -  OG placed yesterday for ileus and bile returns. TF on hold. Lactulose increased yesterday. REamins off vent. Did not need BiPAP. This AM more obtunded but mom thinks was awake at night and is sleeping due to fatigue. Afebrile.  But wBC up 13L. Neg baklance -3L:  OFF PRESSORs = Ammondia up at 72 - [place] mild periphepatic ascites Blood culture  11/25 - Now intubated due to obtunded encephaopathy. CTAbc with SBO v Ileus with transition ponit . CCS consult - no surgery but ok for rectal lactulose w ith continue NG/OG tube to LIS. ID ordered repeat blood culture. Linezolid added.  No ntrition since 06/14/22 Pm. Creat worsening. LEss Ur OP. On levophed 82mcg. Not on sedation. Vent 40% Ammonia 108 Trach aspirate 11/27 - Remains intubated. On propofol 27mcg and levophed 71mcg. Febrile up to 101.90F overnight. On ampicillin and linezolid, going for IR guided LP today. Kidney function  improving, repleting potassium. No seizure activity noted overnight on EEG. Surgery following for ileus, no need for surgical intervention. Holding TF for today.  11/29 - started on pulse dose steroids, getting autoimmune workup, CSF studies Interim History / Subjective:   More responsive today, able to open eyes, blinks to threat, slight spontaneous movement of RUE. Was febrile again overnight. Leukocytosis is improving and pressor requirements are decreasing. Started on pulse dose steroids yesterday night.  Objective   Blood pressure (!) 117/52, pulse 99, temperature 99.9 F (37.7 C), temperature source Oral, resp. rate (!) 25, weight 66.7 kg, SpO2 96 %.    Vent Mode: PRVC FiO2 (%):  [40 %] 40 % Set Rate:  [20 bmp] 20 bmp Vt Set:  [430 mL] 430 mL PEEP:  [5 cmH20] 5 cmH20 Pressure Support:  [10 cmH20] 10 cmH20 Plateau Pressure:  [15 cmH20-17 cmH20] 15 cmH20   Intake/Output Summary (Last 24 hours) at 06/21/2022 0915 Last data filed at 06/21/2022 0640 Gross per 24 hour  Intake 1358.78 ml  Output 1145 ml  Net 213.78 ml   Filed Weights   06/19/22 0500 06/20/22 0500 06/21/22 0100  Weight: 67.1 kg 64.1 kg 66.7 kg    General: critically ill-appearing middle aged female, on mech vent, NAD. HENT: [place]/AT, ETT and G tube in place. Eyes: PERRL, scleral icterus present. CV: tachycardic rate and regular rhythm, no murmurs noted. Pulm: breathing comfortably on vent, ventilated breath sounds. GI: soft, mildly distended, normoactive bowel sounds. Skin: warm and dry. Neuro: more responsive today, opens eyes to verbal stimuli. Orients to voice on the right, but not to the left side. Blinks to threat. Slight spontaneous movement of RUE, none in other extremities.   Resolved Hospital Problem list   Hypokalemia Hypophosphatemia Hypernatremia Thrombocytopenia  Assessment & Plan:   Hypotension/Circulatory shock -recurred 06/14/22, and again 06/15/22 Remains on levophed gtt. Ileus improved per  surgery, tolerated trickle TF. Advancing TF to goal. Will restart midodrine to decrease pressor need. -MAP goal >65, on pressor support -- wean as tolerated -restart midodrine 10mg  q8h  -holding lasix and aldactone given pressor need  Encephalopathy - hepatic and metabolic New Seizure (56/38) 2/2 hyperammonemia, listeria meningitis, uremia More responsive today, but remains encephalopathic. Off of sedation for past 3 days. Started on pulse dose steroids yesterday night. Repeat LP with similar findings, unable to fully differentiate between infectious and inflammatory etiologies. CSF serologies and autoimmune workup pending. -appreciate neuro and ID assistance -continue merrem per ID -NPO, advancing TF -lactulose and rifaximin per tube BID -continue keppra -f/u CSF serologies -f/u autoimmune workup  Cirrhosis  Recently diagnosed cirrhosis during last hospitalization. MELD Na score of 17. Child Pugh Class C. FIB-4 score of 7.46 indicates advance fibrosis. Initially thought to be alcoholic in etiology, but unclear given patient stopped drinking a couple of months prior to diagnosis. Will perform autoimmune workup for further evaluation. - supportive care -extractable nuclear antigen Ab panel, anti-Jo 1 Ab, thyroid Ab, RPR, HIV,  mitochondrial Ab, anti-smooth muscle Ab all negative - f/u lupus anticoagulant panel, ANA, ANCA profile, ASMA, AMA, rheumatoid factor, anti-dsDNA, cardiolipin antibodies, oligoclonal bands, autoimmune serum encephalitis panel    ILEUS - recurred 06/14/22 - improving Tolerated trickle TF yesterday, advancing towards goal today. Per surgery, ok to use cortrak for medications so will transition to per tube.  -advance TF as tolerated to goal -lactulose per tube BID -appreciate CCS assistance   Hypernatremia Sodium increased to 150. 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 -LP and autoimmune workup as outlined above -appreciate ID recommendations -will need TEE once extubated  Acute hypoxic respiratory failure (CAP versus aspiration pneumonia and Compressive atelectasis ) - Extubated 11/22, reintubated 06/15/22 - 11/29 -- does not meet criteria for SBT/Extubation in setting of Acute Respiratory Failure due to shock, AKI, encephalopathy Plan - Oral care - HOB > 30 - PRVC - No SBT  AKI (Baseline 0.6- 0.9)  Kidney function slightly worse today, Cr 1.83 > 1.99 with worsening uremia (although mental status slightly improved). Will continue to trend today and if worsening tomorrow, will need to re-consult nephrology. - MAP goal >= 65 - avoid nephrotoxic meds as able - monitor UOP - trend kidney function  Hx SVT Prolonged QT interval  -534msec 06/04/22; 513 msec 06/17/22 - monitor - avoid Qtc prolongation drugs  Anemia - chronic (baseline 8gm-9gm% in oct 2023) -appears to be at baseline, no active bleeding noted -transfuse if hgb <7 and/or hemodynamic instability  Protein caloric malnutrition, mild - starting trickle TF today, if tolerates will advance tomorrow - D5w for now kvo  Hyperglycemia, controlled -monitor as TF are advanced and with pulse dose steroids -changed to resistant SSI -goal BG 140-180 -may need additional TF coverage  Severe physical deconditioning - She will need aggressive PT/OT (likely CIR) for strength training.   Best Practice (right click and "Reselect all SmartList Selections" daily)   Diet/type: tubefeeds - starting trickle TF DVT prophylaxis: prophylactic heparin  GI prophylaxis: PPI Lines: Central line - RUE PICC since 06/12/22 Foley:  Yes, and it is still needed Code Status:  full code Last date of multidisciplinary goals of care discussion [updated mother at bedside 11/30]   Virl Axe, MD 06/21/2022, 9:15 AM     LABS    PULMONARY Recent Labs  Lab 06/15/22 1037 06/15/22 2107 06/15/22 2238  PHART 7.557* 7.564* 7.502*  PCO2ART 35.2 38.2 43.8  PO2ART 67* 91 69*  HCO3 31.3* 34.7* 34.5*  TCO2 32 36* 36*  O2SAT 95 98 95    CBC Recent Labs  Lab 06/19/22 0402 06/20/22 0352 06/21/22 0409  HGB 8.8* 8.4* 8.2*  HCT 27.8* 26.5* 25.6*  WBC 15.0* 16.6* 14.0*  PLT 242 224 187    COAGULATION Recent Labs  Lab 06/16/22 0335  INR 1.4*    CHEMISTRY Recent Labs  Lab 06/15/22 0403 06/15/22 1037 06/15/22 2052 06/15/22 2107 06/16/22 0335 06/16/22 0826 06/17/22 0253 06/18/22 0338 06/18/22 0527 06/18/22 0954 06/19/22 0402 06/19/22 2025 06/20/22 0352 06/21/22 0409  NA 153*   < > 150*   < > 148*   < > 141 137 140  --  142  --  143 150*  K 4.5   < > 4.6   < > 4.7   < > 5.3* 2.7* 2.5*   < > 3.1*   < > 3.9 3.5  CL 109  --  104  --  102   < > 94* 94* 96*  --  102  --  109 111  CO2 27  --  31  --  31   < > 26 24 24   --  23  --  21* 21*  GLUCOSE 170*  --  129*  --  144*   < > 129* 144* 135*  --  147*  --  132* 210*  BUN 57*  --  70*  --  76*   < > 74* 71* 67*  --  59*  --  71* 82*  CREATININE 0.89  --  1.88*  --  2.30*   < > 2.61* 2.19* 2.08*  --  1.83*  --  1.83* 1.99*  CALCIUM 9.9  --  9.3  --  9.1   < > 8.3* 7.4* 7.4*  --  7.3*  --  7.5* 8.3*  MG 2.6*  --  3.0*  --  2.7*  --  2.8*  --   --   --  2.6*  --   --   --   PHOS 3.6  --   --   --  6.1*  --  8.1*  --   --   --   --   --   --   --    < > = values in this interval not displayed.   Estimated Creatinine Clearance: 31.1 mL/min (A) (by C-G formula based on SCr of 1.99 mg/dL (H)).   LIVER Recent Labs  Lab 06/16/22 0335 06/17/22 0253 06/18/22 0338 06/19/22 0402 06/20/22 0352 06/20/22 1141 06/21/22 0409  AST 62* 60* 51* 42* 40  --  47*  ALT 21 21 20 20 22   --  20  ALKPHOS 108 107 78 71 87  --  81  BILITOT 2.8* 3.3* 3.4* 4.5* 4.9* 4.7* 4.7*  PROT 7.1 7.4 6.5 6.8 6.4*  --  6.3*  ALBUMIN 2.9* 3.8 3.3* 3.3* 2.9*  --  2.8*   INR 1.4*  --   --   --   --   --   --      INFECTIOUS Recent Labs  Lab 06/15/22 1054 06/16/22 0335 06/16/22 0826 06/17/22 0253 06/17/22 0514  LATICACIDVEN 1.7 2.1* 1.9 6.8* 1.8  PROCALCITON 0.55 1.63  --  0.78  --      ENDOCRINE CBG (last 3)  Recent Labs    06/20/22 2350 06/21/22 0331 06/21/22 0828  GLUCAP 135* 195* 224*         IMAGING x48h  - image(s) personally visualized  -   highlighted in bold DG FL GUIDED LUMBAR PUNCTURE  Result Date: 06/20/2022 CLINICAL DATA:  Patient with listeria meningitis, encephalopathy, 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 Previous fluoroscopically guided lumbar puncture 06/01/22, 06/18/22. Request for repeat lumbar puncture to further evaluate. EXAM: LUMBAR PUNCTURE UNDER FLUOROSCOPY PROCEDURE: An appropriate skin entry site was determined fluoroscopically. Operator donned sterile gloves and mask. Skin site was marked, then prepped with Betadine, draped in usual sterile fashion, and infiltrated locally with 1% lidocaine. A 20 gauge spinal needle advanced into the thecal sac at L3-4 level. Hazy yellow CSF spontaneously returned, with opening pressure of 23 cm water. 20 ml CSF were collected and divided among 4 sterile vials for the requested laboratory studies. Closing pressure 12 cm water The needle was then removed. The patient tolerated the procedure well and there were no complications. FLUOROSCOPY: Radiation Exposure Index (as provided by the fluoroscopic  device): 8.10 mGy Kerma IMPRESSION: Technically successful lumbar puncture under fluoroscopy. This exam was performed by Candiss Norse, PA-C, and was supervised and interpreted by Nelson Chimes, MD. Electronically Signed   By: Nelson Chimes M.D.   On: 06/20/2022 14:15   DG CHEST PORT 1 VIEW  Result Date: 06/19/2022 CLINICAL DATA:  Hypoxia EXAM: PORTABLE CHEST 1 VIEW COMPARISON:  06/15/2022 FINDINGS: Endotracheal tube seen 3.7 cm above the carina, withdrawn since  prior examination. Nasogastric tube and nasoenteric feeding tube extending into the upper abdomen beyond the margin of the examination. Right upper extremity PICC line tip noted within the superior right atrium. Lung volumes are small, but are stable since prior examination. Partial right lower lobe collapse noted. Left perihilar pulmonary infiltrate appears stable, asymmetric pulmonary edema versus infection. No pneumothorax or pleural effusion. Cardiac size within normal limits. IMPRESSION: 1. Endotracheal tube slightly withdrawn since prior examination, now 3.7 cm above the carina. Otherwise stable support lines and tubes. 2. Stable pulmonary hypoinflation with right lower lobe collapse. 3. Stable left perihilar pulmonary infiltrate, edema versus infection. Electronically Signed   By: Fidela Salisbury M.D.   On: 06/19/2022 15:23   MR BRAIN W WO CONTRAST  Result Date: 06/19/2022 CLINICAL DATA:  Altered mental status. Encephalopathy. Lumbar puncture of 06/18/2022. Rule out CSF leak/spontaneous intracranial hypotension EXAM: MRI HEAD WITHOUT AND WITH CONTRAST MRI CERVICAL SPINE WITHOUT AND WITH CONTRAST TECHNIQUE: Multiplanar, multiecho pulse sequences of the brain and surrounding structures, and cervical spine, to include the craniocervical junction and cervicothoracic junction, were obtained without and with intravenous contrast. CONTRAST:  7.24mL GADAVIST GADOBUTROL 1 MMOL/ML IV SOLN COMPARISON:  MRI head 06/17/2022 FINDINGS: MRI HEAD FINDINGS Brain: Negative for acute infarct or mass. No acute hemorrhage or fluid collection. Pituitary mildly enlarged for age measuring 8 mm in height. This is unchanged. Pituitary enhances homogeneously. There is hyperintensity in the sulci of FLAIR imaging which has progressed in the interval. Ventricles remain mildly enlarged and unchanged. Following contrast infusion, there is diffuse pack E meningeal enhancement and mild thickening. The mamillopontine distance is 4.3 mm  which is narrowed. These findings can be seen with intracranial hypotension however can be also seen with meningitis. Vascular: Normal arterial flow voids.  Normal venous enhancement Skull and upper cervical spine: No focal skeletal lesion. Sinuses/Orbits: Paranasal sinuses clear. Mild mastoid effusion bilaterally. Negative orbit Other: None MRI CERVICAL SPINE FINDINGS Alignment: Mild anterolisthesis C2-3. Mild retrolisthesis C5-6. 3 mm retrolisthesis C6-7 Vertebrae: Negative for fracture or mass. No evidence of spinal infection or abscess. Cord: Normal signal and morphology Posterior Fossa, vertebral arteries, paraspinal tissues: The patient is intubated. NG tube in feeding tube in the esophagus. No mass, adenopathy, or fluid collection in the neck. Disc levels: C2-3: Negative C3-4: Disc degeneration with asymmetric spurring on the left. Moderate left foraminal narrowing. C4-5: Disc degeneration with mild uncinate spurring. Mild left foraminal narrowing C5-6: Disc degeneration with uncinate spurring. Moderate right foraminal narrowing and mild left foraminal narrowing C6-7: Mild disc degeneration and spurring without significant stenosis C7-T1 height negative IMPRESSION: 1. Negative for acute infarct or mass. 2. Diffuse pachymeningeal enhancement and mild thickening. Sulcal hyperintensity on FLAIR has progressed in the interval. Mild pituitary enlargement for age unchanged. The mamillopontine distance is narrowed at 4.3 mm. Findings can be seen with intracranial hypotension however some of these findings can also be seen with meningitis. 3. No evidence of spinal infection in the cervical spine. Cervical spondylosis causing foraminal narrowing as described above. Electronically Signed   By: Juanda Crumble  Carlis Abbott M.D.   On: 06/19/2022 13:04   MR CERVICAL SPINE W WO CONTRAST  Result Date: 06/19/2022 CLINICAL DATA:  Altered mental status. Encephalopathy. Lumbar puncture of 06/18/2022. Rule out CSF leak/spontaneous  intracranial hypotension EXAM: MRI HEAD WITHOUT AND WITH CONTRAST MRI CERVICAL SPINE WITHOUT AND WITH CONTRAST TECHNIQUE: Multiplanar, multiecho pulse sequences of the brain and surrounding structures, and cervical spine, to include the craniocervical junction and cervicothoracic junction, were obtained without and with intravenous contrast. CONTRAST:  7.27mL GADAVIST GADOBUTROL 1 MMOL/ML IV SOLN COMPARISON:  MRI head 06/17/2022 FINDINGS: MRI HEAD FINDINGS Brain: Negative for acute infarct or mass. No acute hemorrhage or fluid collection. Pituitary mildly enlarged for age measuring 8 mm in height. This is unchanged. Pituitary enhances homogeneously. There is hyperintensity in the sulci of FLAIR imaging which has progressed in the interval. Ventricles remain mildly enlarged and unchanged. Following contrast infusion, there is diffuse pack E meningeal enhancement and mild thickening. The mamillopontine distance is 4.3 mm which is narrowed. These findings can be seen with intracranial hypotension however can be also seen with meningitis. Vascular: Normal arterial flow voids.  Normal venous enhancement Skull and upper cervical spine: No focal skeletal lesion. Sinuses/Orbits: Paranasal sinuses clear. Mild mastoid effusion bilaterally. Negative orbit Other: None MRI CERVICAL SPINE FINDINGS Alignment: Mild anterolisthesis C2-3. Mild retrolisthesis C5-6. 3 mm retrolisthesis C6-7 Vertebrae: Negative for fracture or mass. No evidence of spinal infection or abscess. Cord: Normal signal and morphology Posterior Fossa, vertebral arteries, paraspinal tissues: The patient is intubated. NG tube in feeding tube in the esophagus. No mass, adenopathy, or fluid collection in the neck. Disc levels: C2-3: Negative C3-4: Disc degeneration with asymmetric spurring on the left. Moderate left foraminal narrowing. C4-5: Disc degeneration with mild uncinate spurring. Mild left foraminal narrowing C5-6: Disc degeneration with uncinate spurring.  Moderate right foraminal narrowing and mild left foraminal narrowing C6-7: Mild disc degeneration and spurring without significant stenosis C7-T1 height negative IMPRESSION: 1. Negative for acute infarct or mass. 2. Diffuse pachymeningeal enhancement and mild thickening. Sulcal hyperintensity on FLAIR has progressed in the interval. Mild pituitary enlargement for age unchanged. The mamillopontine distance is narrowed at 4.3 mm. Findings can be seen with intracranial hypotension however some of these findings can also be seen with meningitis. 3. No evidence of spinal infection in the cervical spine. Cervical spondylosis causing foraminal narrowing as described above. Electronically Signed   By: Franchot Gallo M.D.   On: 06/19/2022 13:04   DG Abd Portable 1V  Result Date: 06/19/2022 CLINICAL DATA:  Small-bowel obstruction EXAM: PORTABLE ABDOMEN - 1 VIEW COMPARISON:  06/18/2022 FINDINGS: Nasogastric tube tip in the antrum of the stomach. Soft feeding tube in the region of the pylorus or proximal duodenum. Persistent dilated small intestine, largest loop 6 cm. Some previously administered contrast present within the stomach and within the proximal small bowel IMPRESSION: Persistent small bowel obstruction pattern. Nasogastric tube tip in the antrum of the stomach. Soft feeding tube in the region of the pylorus or proximal duodenum. Electronically Signed   By: Nelson Chimes M.D.   On: 06/19/2022 09:51

## 2022-06-21 NOTE — Progress Notes (Signed)
Neurology Progress Note  Major interval events/Subjective: - Improving responsiveness today - Continues to have fevers, worsening renal function, but decreasing pressor requirement  Current Facility-Administered Medications:    0.9 %  sodium chloride infusion, , Intravenous, PRN, Julian Hy, DO, Last Rate: 10 mL/hr at 06/21/22 0600, Infusion Verify at 06/21/22 0600   acetaminophen (TYLENOL) 160 MG/5ML solution 650 mg, 650 mg, Per Tube, Q6H PRN, Judd Lien, MD, 650 mg at 06/21/22 0023   Chlorhexidine Gluconate Cloth 2 % PADS 6 each, 6 each, Topical, Daily, Candee Furbish, MD, 6 each at 06/20/22 1019   dextrose 5 % solution, , Intravenous, Continuous, Brand Males, MD, Stopped at 06/17/22 1629   feeding supplement (VITAL 1.5 CAL) liquid 1,000 mL, 1,000 mL, Per Tube, Continuous, Virl Axe, MD, Last Rate: 20 mL/hr at 06/21/22 0600, Infusion Verify at 06/21/22 0600   fentaNYL (SUBLIMAZE) injection 50-200 mcg, 50-200 mcg, Intravenous, Q30 min PRN, Chesley Mires, MD, 50 mcg at 03/[birth year]/47 1856   folic acid injection 1 mg, 1 mg, Intravenous, Daily, Chesley Mires, MD, 1 mg at 06/20/22 1130   heparin injection 5,000 Units, 5,000 Units, Subcutaneous, Q8H, Corey Harold, NP, 5,000 Units at 06/21/22 0502   insulin aspart (novoLOG) injection 0-20 Units, 0-20 Units, Subcutaneous, Q4H, Virl Axe, MD, 7 Units at 06/21/22 0833   lactulose [facility]) enema 200 gm, 300 mL, Rectal, Daily, Virl Axe, MD, 300 mL at 06/20/22 1017   leptospermum manuka honey (MEDIHONEY) paste 1 Application, 1 Application, Topical, Daily, Brand Males, MD, 1 Application at 31/49/70 1131   levETIRAcetam (KEPPRA) IVPB 500 mg/100 mL premix, 500 mg, Intravenous, BID, Norie Latendresse L, MD, Stopped at 06/20/22 2145   lip balm (CARMEX) ointment, , Topical, PRN, Virl Axe, MD   meropenem (MERREM) 2 g in sodium chloride 0.9 % 100 mL IVPB, 2 g, Intravenous, Q12H, Sinclair, Emily S, RPH, Stopped at 06/20/22  2221   [COMPLETED] methylPREDNISolone sodium succinate (SOLU-MEDROL) 1,000 mg in sodium chloride 0.9 % 50 mL IVPB, 1,000 mg, Intravenous, Once, Stopped at 06/20/22 2331 **FOLLOWED BY** methylPREDNISolone sodium succinate (SOLU-MEDROL) 1,000 mg in sodium chloride 0.9 % 50 mL IVPB, 1,000 mg, Intravenous, Once **FOLLOWED BY** [START ON 06/22/2022] methylPREDNISolone sodium succinate (SOLU-MEDROL) 1,000 mg in sodium chloride 0.9 % 50 mL IVPB, 1,000 mg, Intravenous, Once **FOLLOWED BY** [START ON 06/23/2022] methylPREDNISolone sodium succinate (SOLU-MEDROL) 1,000 mg in sodium chloride 0.9 % 50 mL IVPB, 1,000 mg, Intravenous, Once **FOLLOWED BY** [START ON 06/24/2022] methylPREDNISolone sodium succinate (SOLU-MEDROL) 1,000 mg in sodium chloride 0.9 % 50 mL IVPB, 1,000 mg, Intravenous, Once, Kataleya Zaugg L, MD   norepinephrine (LEVOPHED) 16 mg in 232mL (0.064 mg/mL) premix infusion, 0-40 mcg/min, Intravenous, Titrated, Mannam, Praveen, MD, Last Rate: 11.25 mL/hr at 06/21/22 0600, 12 mcg/min at 06/21/22 0600   Oral care mouth rinse, 15 mL, Mouth Rinse, Q2H, Sood, Vineet, MD, 15 mL at 06/21/22 0829   Oral care mouth rinse, 15 mL, Mouth Rinse, PRN, Halford Chessman, Vineet, MD   pantoprazole (PROTONIX) injection 40 mg, 40 mg, Intravenous, Q24H, Sood, Vineet, MD, 40 mg at 06/20/22 2130   propofol (DIPRIVAN) 1000 MG/100ML infusion, 0-50 mcg/kg/min, Intravenous, Continuous, Greta Doom, MD, Stopped at 06/18/22 1520   rifaximin (XIFAXAN) tablet 550 mg, 550 mg, Per Tube, BID, Ramaswamy, Murali, MD, 550 mg at 06/20/22 2130   sodium chloride flush (NS) 0.9 % injection 10-40 mL, 10-40 mL, Intracatheter, Q12H, Julian Hy, DO, 10 mL at 06/20/22 2127   sodium chloride flush (NS) 0.9 % injection  10-40 mL, 10-40 mL, Intracatheter, PRN, Julian Hy, DO   thiamine (VITAMIN B1) injection 100 mg, 100 mg, Intravenous, Daily, Halford Chessman, Vineet, MD, 100 mg at 06/20/22 0945   white petrolatum (VASELINE) gel, , Topical, PRN, Julian Hy, DO, 1 Application at 35/36/14 0809  Current vital signs: BP (!) 117/52   Pulse 99   Temp 99.9 F (37.7 C) (Oral)   Resp (!) 25   Wt 66.7 kg   SpO2 96%   BMI 25.24 kg/m  Vital signs in last 24 hours: Temp:  [98.4 F (36.9 C)-101.1 F (38.4 C)] 99.9 F (37.7 C) (11/30 0829) Pulse Rate:  [96-122] 99 (11/30 0645) Resp:  [14-31] 25 (11/30 0645) BP: (108-136)/(44-66) 117/52 (11/30 0752) SpO2:  [95 %-100 %] 96 % (11/30 0645) FiO2 (%):  [40 %] 40 % (11/30 0752) Weight:  [66.7 kg] 66.7 kg (11/30 0100)   Physical Exam  Constitutional: Appears chronically ill  Psych: Minimally interactive Eyes: Scleral edema is minimal HENT: ET tube in place MSK: no joint deformities.  Cardiovascular: Mildly tachy Respiratory: Breathing comfortably on the ventilator,  GI: Mildly distended Skin: Scattered bruising throughout, some icterus   Neuro: Mental Status: Opens eyes to voice today, orients to examiner on the right, but not to the left. Cranial Nerves: Blinks to threat bilaterally.  Pupils are equal, round, and reactive to light.   Orients eyes to examiner's voice to the right but not to the left Intact cough Spontaneously protrudes tongue just prior to coughing, grossly midline within limits of ET tube Motor/Sensory: Slight spontaneous movement of the right upper extremity.  Mom at bedside reports some spontaneous movement of the left lower extremity though I do not see this.  Flicker of response to noxious stimulation in the right upper extremity but not elsewhere Cerebellar: Unable to assess secondary to patient's mental status    Pertinent Labs:  Basic Metabolic Panel: Recent Labs  Lab 06/15/22 0403 06/15/22 1037 06/15/22 2052 06/15/22 2107 06/16/22 0335 06/16/22 0826 06/17/22 0253 06/18/22 0338 06/18/22 0527 06/18/22 0954 06/18/22 1531 06/19/22 0402 06/19/22 2025 06/20/22 0352 06/21/22 0409  NA 153*   < > 150*   < > 148*   < > 141 137 140  --   --  142  --   143 150*  K 4.5   < > 4.6   < > 4.7   < > 5.3* 2.7* 2.5*   < > 3.1* 3.1* 3.4* 3.9 3.5  CL 109  --  104  --  102   < > 94* 94* 96*  --   --  102  --  109 111  CO2 27  --  31  --  31   < > 26 24 24   --   --  23  --  21* 21*  GLUCOSE 170*  --  129*  --  144*   < > 129* 144* 135*  --   --  147*  --  132* 210*  BUN 57*  --  70*  --  76*   < > 74* 71* 67*  --   --  59*  --  71* 82*  CREATININE 0.89  --  1.88*  --  2.30*   < > 2.61* 2.19* 2.08*  --   --  1.83*  --  1.83* 1.99*  CALCIUM 9.9  --  9.3  --  9.1   < > 8.3* 7.4* 7.4*  --   --  7.3*  --  7.5* 8.3*  MG 2.6*  --  3.0*  --  2.7*  --  2.8*  --   --   --   --  2.6*  --   --   --   PHOS 3.6  --   --   --  6.1*  --  8.1*  --   --   --   --   --   --   --   --    < > = values in this interval not displayed.     CBC: Recent Labs  Lab 06/15/22 0403 06/15/22 1037 06/17/22 0253 06/18/22 1531 06/19/22 0402 06/20/22 0352 06/21/22 0409  WBC 13.1*   < > 23.5* 16.5* 15.0* 16.6* 14.0*  NEUTROABS 9.6*  --   --   --  12.0* 13.2* 12.3*  HGB 10.5*   < > 9.8* 8.7* 8.8* 8.4* 8.2*  HCT 32.6*   < > 31.1* 26.7* 27.8* 26.5* 25.6*  MCV 119.0*   < > 118.7* 112.7* 115.4* 116.2* 114.8*  PLT 277   < > 324 255 242 224 187   < > = values in this interval not displayed.     Coagulation Studies: No results for input(s): "LABPROT", "INR" in the last 72 hours.   CSF Opening pressure 11/27 25, closing pressure 19 Opening pressure 11/29 23, closing pressure 12  Latest Reference Range & Units 06/01/22 13:43 06/18/22 11:04  Appearance, CSF CLEAR  CLOUDY ! HAZY !  Glucose, CSF 40 - 70 mg/dL 86 (H) 94 (H) -- serum 193 48% of serum  RBC Count, CSF 0 /cu mm 26,400 (H) 5,000 (H)  WBC, CSF 0 - 5 /cu mm 80 (HH) 191 (HH)  Segmented Neutrophils-CSF 0 - 6 % 37 (H) 3  Lymphs, CSF 40 - 80 % 53 92 (H)  Monocyte-Macrophage-Spinal Fluid 15 - 45 % 9 (L) 5 (L)  Eosinophils, CSF 0 - 1 % 1 0  Color, CSF COLORLESS  RED ! AMBER !  Supernatant  XANTHOCHROMIC XANTHOCHROMIC   Total  Protein, CSF 15 - 45 mg/dL >600 (H) >600 (H)  (HH): Data is critically high; !: Data is abnormal; (H): Data is abnormally high; (L): Data is abnormally low   CSF 11/30: RBC 5725/1800, WBC 61/88 (lymphocytic predominance), xanthochromic with protein of 423 and glucose of 92 (serum 141)  Autoimmune labs:  Thyroperoxidase Ab SerPl-aCnc 0 - 34 IU/mL 22  Thyroglobulin Antibody 0.0 - 0.9 IU/mL     Additional serum labs TSH: 0.095 (low) fT4 0.72 (wnl) HIV neg   EEG  generalized continuous slowing previously, read today pending MRI brain w/o contrast 06/17/2022 personally reviewed, agree with radiology:   No acute intracranial pathology or epileptogenic focus identified.   MRI brain and C-spine 06/20/2022 1. Negative for acute infarct or mass. 2. Diffuse pachymeningeal enhancement and mild thickening. Sulcal hyperintensity on FLAIR has progressed in the interval. Mild pituitary enlargement for age unchanged. The mamillopontine distance is narrowed at 4.3 mm. Findings can be seen with intracranial hypotension however some of these findings can also be seen with meningitis. 3. No evidence of spinal infection in the cervical spine. Cervical spondylosis causing foraminal narrowing as described above.  CT abdomen pelvis w/o contrast 06/15/22 Multiple dilated loops of small bowel with a transition zone in the mid to distal ileum consistent with a least partial small bowel obstruction. Bibasilar consolidation with left-sided pleural effusions stable from the prior CT. Cirrhotic changes of the liver with ascites. Ascites may also be  in part due to the underlying obstructive change.    CT chest 11/30 a.m. formal read pending but on personal review with CCM team there is some concern for pneumonia   Assessment: With initiation of steroids, patient is slightly improved today.  Extended discussion with mom at bedside about the guarded prognosis, with high risk of worsening infection  due to immunosuppression with pulse dose steroids and current concern for pneumonia, possibly ventilator associated.  Other considerations for infectious causes resulting in the patient's CSF findings other than Listeria would be HSV/VZV versus TB.  At this time low concern for TB based on CT chest (in discussion with CCM team), as she has had negative HSV/VZV PCR but I have added on IgG for confirmation.  If she initially improves with steroids but then worsens and VZV IgG has not resulted may consider acyclovir or would consider it if infectious disease feels it is prudent to start at this time.  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 Potential for autoimmune component  -Ileus/concern for partial small bowel obstruction, appreciate surgery following -Acute respiratory failure due to shock AKI and encephalopathy -Prolonged Qtc -Chronic anemia  Recommendations: # Encephalopathy # CSF Inflammatory process, infectious vs. autoimmune -Follow-up pending repeat CSF studies from 11/29 oligoclonal bands, IgG  index, bacterial culture, fungal culture, anaerobic culture, VDRL, CSF flow cytometry, CSF cytopathology, autoimmune CSF encephalitis panel, VZV IgG CSF -Continue pulse dose steroids 1 g daily for 5 days, keep 20 hours given first dose was at 2230 on 11/29, complete dosing on 12/3 -Follow-up pending autoimmune studies: Lupus anticoagulant panel, ANA with reflex if positive, anti-Jo1, SSA, SSB, thyroid antibodies, ANCA profile, anti-smooth muscle antibody, mitochondrial antibodies, rheumatoid factor, anti-DS DNA, cardiolipin antibodies, oligoclonal bands, autoimmune serum encephalitis panel -Follow up additional serological workup: RPR   # Clinical concern for seizure, seizure free on Keppra - Will follow-up continuous EEG read today and consider discontinuing - s/p Keppra 3500 mg load, then 1000 mg BID; given renal function and somnolence reduced to 500 mg BID on 11/27, will continue to adjust as needed given fluctuating renal function, dose ranges for reference: Estimated Creatinine Clearance: 31.1 mL/min (A) (by C-G formula based on SCr of 1.99 mg/dL (H)).   CrCl 80 to 130 mL/minute/1.73 m2: 500 mg to 1.5 g every 12 hours.  CrCl 50 to <80 mL/minute/1.73 m2: 500 mg to 1 g every 12 hours.  CrCl 30 to <50 mL/minute/1.73 m2: 250 to 750 mg every 12 hours.  CrCl 15 to <30 mL/minute/1.73 m2: 250 to 500 mg every 12 hours.  CrCl <15 mL/minute/1.73 m2: 250 to 500 mg every 24 hours (expert opinion).  Lesleigh Noe MD-PhD [facility] [phone]   CRITICAL CARE Performed by: Lorenza Chick   Total critical care time: 40 minutes  Critical care time was exclusive of separately billable procedures and treating other patients.  Critical care was necessary to treat or prevent imminent or life-threatening deterioration.  Critical care was time spent personally by me on the following activities: development of treatment plan with patient and/or surrogate as well as nursing, discussions  with consultants, evaluation of patient's response to treatment, examination of patient, obtaining history from patient or surrogate, ordering and performing treatments and interventions, ordering and review of laboratory studies, ordering and review of radiographic studies, pulse oximetry and re-evaluation of patient's condition.

## 2022-06-22 DIAGNOSIS — R7881 Bacteremia: Secondary | ICD-10-CM

## 2022-06-22 DIAGNOSIS — G9340 Encephalopathy, unspecified: Secondary | ICD-10-CM | POA: Diagnosis not present

## 2022-06-22 DIAGNOSIS — A419 Sepsis, unspecified organism: Secondary | ICD-10-CM | POA: Diagnosis not present

## 2022-06-22 DIAGNOSIS — R6520 Severe sepsis without septic shock: Secondary | ICD-10-CM | POA: Diagnosis not present

## 2022-06-22 DIAGNOSIS — A329 Listeriosis, unspecified: Secondary | ICD-10-CM | POA: Diagnosis not present

## 2022-06-22 DIAGNOSIS — J9601 Acute respiratory failure with hypoxia: Secondary | ICD-10-CM | POA: Diagnosis not present

## 2022-06-22 LAB — GLUCOSE, CAPILLARY
Glucose-Capillary: 198 mg/dL — ABNORMAL HIGH (ref 70–99)
Glucose-Capillary: 200 mg/dL — ABNORMAL HIGH (ref 70–99)
Glucose-Capillary: 205 mg/dL — ABNORMAL HIGH (ref 70–99)
Glucose-Capillary: 210 mg/dL — ABNORMAL HIGH (ref 70–99)
Glucose-Capillary: 219 mg/dL — ABNORMAL HIGH (ref 70–99)
Glucose-Capillary: 243 mg/dL — ABNORMAL HIGH (ref 70–99)

## 2022-06-22 LAB — COMPREHENSIVE METABOLIC PANEL
ALT: 21 U/L (ref 0–44)
AST: 67 U/L — ABNORMAL HIGH (ref 15–41)
Albumin: 2.7 g/dL — ABNORMAL LOW (ref 3.5–5.0)
Alkaline Phosphatase: 89 U/L (ref 38–126)
Anion gap: 17 — ABNORMAL HIGH (ref 5–15)
BUN: 100 mg/dL — ABNORMAL HIGH (ref 6–20)
CO2: 21 mmol/L — ABNORMAL LOW (ref 22–32)
Calcium: 8.9 mg/dL (ref 8.9–10.3)
Chloride: 115 mmol/L — ABNORMAL HIGH (ref 98–111)
Creatinine, Ser: 2.22 mg/dL — ABNORMAL HIGH (ref 0.44–1.00)
GFR, Estimated: 26 mL/min — ABNORMAL LOW (ref >60)
Glucose, Bld: 248 mg/dL — ABNORMAL HIGH (ref 70–99)
Potassium: 3 mmol/L — ABNORMAL LOW (ref 3.5–5.1)
Sodium: 153 mmol/L — ABNORMAL HIGH (ref 135–145)
Total Bilirubin: 4 mg/dL — ABNORMAL HIGH (ref 0.3–1.2)
Total Protein: 6.2 g/dL — ABNORMAL LOW (ref 6.5–8.1)

## 2022-06-22 LAB — CRYPTOCOCCAL ANTIGEN: Crypto Ag: NEGATIVE

## 2022-06-22 LAB — IGG: IgG (Immunoglobin G), Serum: 1047 mg/dL (ref 586–1602)

## 2022-06-22 LAB — CBC WITH DIFFERENTIAL/PLATELET
Abs Immature Granulocytes: 0.65 10*3/uL — ABNORMAL HIGH (ref 0.00–0.07)
Basophils Absolute: 0.1 10*3/uL (ref 0.0–0.1)
Basophils Relative: 0 %
Eosinophils Absolute: 0 10*3/uL (ref 0.0–0.5)
Eosinophils Relative: 0 %
HCT: 26.2 % — ABNORMAL LOW (ref 36.0–46.0)
Hemoglobin: 8.5 g/dL — ABNORMAL LOW (ref 12.0–15.0)
Immature Granulocytes: 3 %
Lymphocytes Relative: 9 %
Lymphs Abs: 2 10*3/uL (ref 0.7–4.0)
MCH: 37 pg — ABNORMAL HIGH (ref 26.0–34.0)
MCHC: 32.4 g/dL (ref 30.0–36.0)
MCV: 113.9 fL — ABNORMAL HIGH (ref 80.0–100.0)
Monocytes Absolute: 0.9 10*3/uL (ref 0.1–1.0)
Monocytes Relative: 4 %
Neutro Abs: 18.5 10*3/uL — ABNORMAL HIGH (ref 1.7–7.7)
Neutrophils Relative %: 84 %
Platelets: 158 10*3/uL (ref 150–400)
RBC: 2.3 MIL/uL — ABNORMAL LOW (ref 3.87–5.11)
RDW: 16.2 % — ABNORMAL HIGH (ref 11.5–15.5)
WBC: 22.1 10*3/uL — ABNORMAL HIGH (ref 4.0–10.5)
nRBC: 1.5 % — ABNORMAL HIGH (ref 0.0–0.2)

## 2022-06-22 LAB — BASIC METABOLIC PANEL
Anion gap: 19 — ABNORMAL HIGH (ref 5–15)
BUN: 99 mg/dL — ABNORMAL HIGH (ref 6–20)
CO2: 19 mmol/L — ABNORMAL LOW (ref 22–32)
Calcium: 9 mg/dL (ref 8.9–10.3)
Chloride: 111 mmol/L (ref 98–111)
Creatinine, Ser: 2.27 mg/dL — ABNORMAL HIGH (ref 0.44–1.00)
GFR, Estimated: 25 mL/min — ABNORMAL LOW (ref >60)
Glucose, Bld: 410 mg/dL — ABNORMAL HIGH (ref 70–99)
Potassium: 3.2 mmol/L — ABNORMAL LOW (ref 3.5–5.1)
Sodium: 149 mmol/L — ABNORMAL HIGH (ref 135–145)

## 2022-06-22 LAB — VDRL, CSF: VDRL Quant, CSF: NONREACTIVE

## 2022-06-22 LAB — IGG CSF INDEX
Albumin CSF-mCnc: 223 mg/dL — ABNORMAL HIGH (ref 8–37)
Albumin: 3.1 g/dL — ABNORMAL LOW (ref 3.8–4.9)
CSF IgG Index: 0.6 (ref 0.0–0.7)
IgG (Immunoglobin G), Serum: 930 mg/dL (ref 586–1602)
IgG, CSF: 39.2 mg/dL — ABNORMAL HIGH (ref 0.0–6.7)
IgG/Alb Ratio, CSF: 0.18 (ref 0.00–0.25)

## 2022-06-22 LAB — PTT-LA MIX: PTT-LA Mix: 44.2 s — ABNORMAL HIGH (ref 0.0–40.5)

## 2022-06-22 LAB — DRVVT CONFIRM: dRVVT Confirm: 1.2 ratio (ref 0.8–1.2)

## 2022-06-22 LAB — LUPUS ANTICOAGULANT PANEL
DRVVT: 53.2 s — ABNORMAL HIGH (ref 0.0–47.0)
PTT Lupus Anticoagulant: 45.9 s — ABNORMAL HIGH (ref 0.0–43.5)

## 2022-06-22 LAB — DRVVT MIX: dRVVT Mix: 43.7 s — ABNORMAL HIGH (ref 0.0–40.4)

## 2022-06-22 LAB — HEXAGONAL PHASE PHOSPHOLIPID: Hexagonal Phase Phospholipid: 8 s (ref 0–11)

## 2022-06-22 LAB — RHEUMATOID FACTOR: Rheumatoid fact SerPl-aCnc: <10 IU/mL (ref <14.0)

## 2022-06-22 MED ORDER — DEXTROSE 5 % IV SOLN: INTRAVENOUS | Status: DC

## 2022-06-22 MED ORDER — SODIUM CHLORIDE 0.9 % IV SOLN
1.0000 g | Freq: Two times a day (BID) | INTRAVENOUS | Status: DC
  Administered 2022-06-22 – 2022-06-25 (×7): 1 g via INTRAVENOUS
  Filled 2022-06-22 (×7): qty 20

## 2022-06-22 MED ORDER — LACTATED RINGERS IV SOLN: INTRAVENOUS | Status: DC

## 2022-06-22 MED ORDER — INSULIN ASPART 100 UNIT/ML IJ SOLN
6.0000 [IU] | INTRAMUSCULAR | Status: DC
  Administered 2022-06-22 – 2022-06-25 (×20): 6 [IU] via SUBCUTANEOUS

## 2022-06-22 MED ORDER — POTASSIUM CHLORIDE 10 MEQ/50ML IV SOLN
10.0000 meq | INTRAVENOUS | Status: AC
  Administered 2022-06-22 (×4): 10 meq via INTRAVENOUS
  Filled 2022-06-22 (×4): qty 50

## 2022-06-22 NOTE — Progress Notes (Signed)
PHARMACY NOTE:  ANTIMICROBIAL RENAL DOSAGE ADJUSTMENT  Current antimicrobial regimen includes a mismatch between antimicrobial dosage and estimated renal function.  As per policy approved by the Pharmacy & Therapeutics and Medical Executive Committees, the antimicrobial dosage will be adjusted accordingly.  Current antimicrobial dosage:  Meropenem 2 gm IV Q 12 hours   Indication: Listeria/MSSA bacteremia/meningitis and Kleb oxytoca pneumonia   Renal Function:  Estimated Creatinine Clearance: 27.9 mL/min (A) (by C-G formula based on SCr of 2.22 mg/dL (H)). []      On intermittent HD, scheduled: []      On CRRT    Antimicrobial dosage has been changed to:  Meropenem 1 gm q 12 hours   Additional comments:   Thank you for allowing pharmacy to be a part of this patient's care.  Jimmy Footman, PharmD, BCPS, BCIDP Infectious Diseases Clinical Pharmacist Phone: [phone] 06/22/2022 9:12 AM

## 2022-06-22 NOTE — Progress Notes (Signed)
eLink Physician-Brief Progress Note Patient Name: Deanna Mcmillan DOB: 05/03/1971 MRN: [id number]   Date of Service  06/22/2022  HPI/Events of Note  Hypokalemia - K+ = 3.0 and Creatinine = 2.22.  eICU Interventions  Plan: Replace K+. Repeat BMP at 12 noon.     Intervention Category Major Interventions: Electrolyte abnormality - evaluation and management  Jennica Tagliaferri [place] 06/22/2022, 4:20 AM

## 2022-06-22 NOTE — TOC Initial Note (Signed)
Transition of Care [facility]) - Initial/Assessment Note    Patient Details  Name: Deanna Mcmillan MRN: [id number] Date of Birth: [birth year]  [facility]) CM/SW Contact:    Verdell Carmine, RN Phone Number: 06/22/2022, 1:32 PM  Clinical Narrative:                  Day 23 of ICU stay. AMS, sepsis. Continues on IV antibiotics has PICC line, creatinine and WBC treading up ( on IV high dose steroids).  Will need support for ETOH    Barriers to Discharge: Continued Medical Work up   Patient Goals and CMS Choice        Expected Discharge Plan and Services     Discharge Planning Services: CM Consult   Living arrangements for the past 2 months: [place]                                      Prior Living Arrangements/Services Living arrangements for the past 2 months: Single Family Home Lives with:: Parents Patient language and need for interpreter reviewed:: Yes        Need for Family Participation in Patient Care: Yes (Comment) Care giver support system in place?: Yes (comment)   Criminal Activity/Legal Involvement Pertinent to Current Situation/Hospitalization: No - Comment as needed  Activities of Daily Living      Permission Sought/Granted                  Emotional Assessment       Orientation: : Fluctuating Orientation (Suspected and/or reported Sundowners) Alcohol / Substance Use: Alcohol Use Psych Involvement: No (comment)  Admission diagnosis:  Acute respiratory failure with hypoxia ([place]) [J96.01] Patient Active Problem List   Diagnosis Date Noted   MSSA bacteremia 06/16/2022   Small bowel obstruction ([place]) 06/16/2022   Compromised airway 06/15/2022   Septic shock ([place]) 06/14/2022   On mechanically assisted ventilation ([place]) 06/11/2022   Sepsis with acute hypoxic respiratory failure ([place]) 06/11/2022   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   Acute pulmonary edema ([place]) 45/80/9983   Toxic metabolic encephalopathy 38/25/0539    Pressure injury of skin 06/06/2022   Acute respiratory failure with hypoxia ([place]) 06/19/2022   Anasarca 05/13/2022   Abnormal LFTs 76/73/4193   Alcoholic hepatitis 79/08/4095   Normal anion gap metabolic acidosis 35/32/9924   Hyperglycemia 05/13/2022   Coagulopathy ([place]) 05/13/2022   Thrombocytopenia ([place]) 05/13/2022   Macrocytic anemia 05/13/2022   Obesity (BMI 30-39.9) 05/13/2022   Leg edema 05/11/2022   Advanced care planning/counseling discussion    Depression, major, single episode, moderate (HCC)    Goals of care, counseling/discussion    Serum total bilirubin elevated 04/24/2022   Alcohol induced acute pancreatitis 26/83/4196   Alcoholic liver disease, unspecified ([place]) 04/23/2022   Hypoalbuminemia 04/23/2022   Hyponatremia 04/23/2022   Alcohol abuse 04/23/2022   QT prolongation 04/23/2022   Hypokalemia 04/23/2022   Appendicitis, acute s/p laparoscopic appendectomy 04/11/13 04/12/2013   PCP:  Elisabeth Cara, PA-C Pharmacy:   [id number] - [place], [place] - [address] [address] Phone: [phone] Fax: [phone]     Social Determinants of Health (SDOH) Interventions    Readmission Risk Interventions    04/27/2022    1:19 PM 04/24/2022    1:49 PM  Readmission Risk Prevention Plan  Post Dischage Appt  Complete  Medication Screening  Complete  Transportation Screening Complete Complete  PCP or Specialist Appt within 5-7 Days Complete   Home Care Screening Complete   Medication Review (RN CM) Complete

## 2022-06-22 NOTE — Progress Notes (Signed)
NAME:  Deanna Mcmillan, MRN:  [id number], DOB:  January 07, 1971, LOS: 24 ADMISSION DATE:  06/05/2022, CONSULTATION DATE:  11/7 REFERRING MD:  Lendon Colonel, EDP CHIEF COMPLAINT:  AMS   History of Present Illness:  51 yo woman with hx of recent diagnosis of alcoholic hepatitis (dx 78/29), here with ams, fever.   History from [name].  Patient was in her normal state of health, until suddenly the night PTA she developed a headache.  She went to sleep to rest.  The following day she slept throughout the day, that evening her mother went to wake her and was unable to wake her up, so she called 911.     She has been doing well since her recent hospital discharge, taking all the meds as prescribed, no new meds.  Planned follow up with GI on 11/14.  LE edema had been improving.  No cough, sob no focal symptoms.  No known sick contacts.     In ED, febrile, tachycardic, lethargic, desaturating to 70s.   Intubated by ED physician.    Had been admitted earlier in October and started on course of prenisone for alc hepatitis.    Recently seen at [facility] 10/19: LE Edema. Lasix not helpful at home.  Admitted for diuresis.  Jaundice and abd distension also noted.  Pleural effusion.  GI saw: non enough ascites for tap  Started lasix and spironolactone.  28 day course of prednisolone, miralax, lactulose, miralax. Midodrine TID    In ED got cefepime, LR 1L, flagyl ordered, intubated, started on Propofol, vanc ordered   Pertinent  Medical History  ETOH Cirrhosis BLE edema recently worsening  S/p lap appy 2014    Echo: recent normal 56/2130   Home meds: folic ascid, acetaminohpen, lasix 40bid, lactulose 30 TID protonix, prednisone (04/29/22), prednisolone 04/28/22 three week course, thiamine, simethicone,    Per her mother she had been drinking at least one bottle of wine daily, she isnt clear exactly how much she was drinking but she did also drink [place].  She notes she had wanted to stop recently but  been unable to.  She thinks she was depressed after experiencing perimenopause.  No tobacco.   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 11/18 more awake, following commands 1121 RUE PICC 11/22 extubated to BiPAP 11/23 - 14d of Rx complerted for MSSA/MESSE. For listeria - ID wants to extend amp/gent to 3 weeks 11/8-11/29. Ileus post extubation and NG to LIS . [place] increased. BP sofft an dlevophed and alb Rx.   11/24 -  OG placed yesterday for ileus and bile returns. TF on hold. Lactulose increased yesterday. REamins off vent. Did not need BiPAP. This AM more obtunded but mom thinks was awake at night and is sleeping due to fatigue. Afebrile.  But wBC up 13L. Neg baklance -3L:  OFF PRESSORs = [place] up at 72 - [place] mild periphepatic ascites Blood culture  11/25 - Now intubated due to obtunded encephaopathy. CTAbc with SBO v Ileus with transition ponit . CCS consult - no surgery but ok for rectal lactulose w ith continue NG/OG tube to LIS. ID ordered repeat blood culture. Linezolid added.  No ntrition since 06/14/22 Pm. Creat worsening. LEss Ur OP. On levophed 37mcg. Not on sedation. Vent 40% Ammonia 108 Trach aspirate 11/27 - Remains intubated. On propofol 55mcg and levophed 71mcg. Febrile up to 101.772F overnight. On ampicillin and linezolid, going for IR guided LP today. Kidney function  improving, repleting potassium. No seizure activity noted overnight on EEG. Surgery following for ileus, no need for surgical intervention. Holding TF for today.  11/29 - started on pulse dose steroids, getting autoimmune workup, CSF studies Interim History / Subjective:   Neuro exam with mild improvement from yesterday. She is able to open her eyes to voice and blink spontaneously. Unable to turn her head, but was able to orient to voice on the left and right today. Still with tremendous neuromuscular weakness in all extremities.  Was febrile with Tmax 101.72F overnight. Leukocytosis worsened, likely due to pulse dose steroids. MAPs sustaining >65 on low dose levophed.    Objective   Blood pressure (!) 103/56, pulse 86, temperature 100.1 F (37.8 C), temperature source Axillary, resp. rate (!) 24, weight 66.9 kg, SpO2 94 %.    Vent Mode: PSV;CPAP FiO2 (%):  [40 %] 40 % Set Rate:  [20 bmp] 20 bmp Vt Set:  [430 mL] 430 mL PEEP:  [5 cmH20] 5 cmH20 Pressure Support:  [10 cmH20-15 cmH20] 15 cmH20 Plateau Pressure:  [15 cmH20-21 cmH20] 16 cmH20   Intake/Output Summary (Last 24 hours) at 06/22/2022 1320 Last data filed at 06/22/2022 1200 Gross per 24 hour  Intake 3331.03 ml  Output 1710 ml  Net 1621.03 ml   Filed Weights   06/20/22 0500 06/21/22 0100 06/22/22 0500  Weight: 64.1 kg 66.7 kg 66.9 kg   General: critically ill-appearing middle aged female, laying in bed, on mech vent, NAD. HENT: [place]/AT, ETT and G tube in place.  Eyes: opens spontaneously, PERRL, EOMI, scleral icterus present. CV: tachycardic rate and regular rhythm, no murmurs noted. Pulm: breathing comfortably on the ventilator with ventilated breath sounds. Transitioned to PS 15, PEEP 5. GI: soft, nondistended, normoactive bowel sounds. Skin: warm and dry. Extensive bruising again noted on bilateral UE. Neuro: opens eyes to voice, orients to left and right, no obvious tracking. Blinks to threat. No response to distal  noxious stimuli of extremities.    Resolved Hospital Problem list   Hypokalemia Hypophosphatemia Hypernatremia Thrombocytopenia Ileus  Assessment & Plan:   Shock, circulatory versus septic -weaning levophed as tolerated, down to 68mcg -continue midodrine 10mg  q8h per tube -pulse dose steroids (day 3/5) -holding lasix and aldactone given pressor need  Toxic metabolic encephalopathy Cirrhosis [MELD-Na 17, Child Pugh Class C] New GTC seizure (11/26) Persistent encephalopathy of unclear etiology, although slowly improving. Has been off sedation for multiple days. On day 3/5 of pulse dose steroids for possible inflammatory component. CSF serologies and autoimmune workup pending. Her CSF IgG and albumin are elevated, suggestive of possible autoimmune component. Oligoclonal bands pending. Discussed with neurology, will continue steroids and assess clinical response. -appreciate neuro and ID assistance -continue merrem per ID -lactulose and rifaximin per tube BID -continue keppra -pulse dose steroids, day 3/5 -preliminary fungal and anaerobic cultures negative -f/u remaining CSF serologies -f/u remaining autoimmune workup     Hypernatremia Sodium increased 150 > 153 -free water  200cc q4h -D5W @75cc /hr -trend sodium  Acute hypoxic respiratory failure  VAP Klebsiella oxytoca on resp culture Listeria bacteremia and meningitis - completed therapy MSSA bacteremia - completed therapy -extubated 11/22, reintubated 11/24 -transitioned to PS 15, PEEP 5 -does not yet meet criteria for SBT/extubation given shock, AKI, and encephalopathy -Remains on merrem for empiric coverage -LP and autoimmune workup as outlined above -appreciate ID recommendations -has been on MV for over 2 weeks altogether, will need to consider trach placement next week if no improvement -will need TEE once extubated  AKI (Baseline 0.6- 0.9)  Kidney function initially improved, but has began to worsen over the  past couple of days. Does have worsening uremia as well, but mental status slightly improved despite this. Will assess response to maintenance IVF with D5W. If continuing to worsen, will need to re-consult nephrology. -MAP goal >65 -avoid nephrotoxic medications as able -monitor UOP -trend kidney function -D5W @75cc /hr  Prolonged QTc interval  Hx of SVT -567msec 06/04/22; 513 msec 06/17/22; 514 msec 06/22/22 - monitor - avoid QTc prolonging medications  Anemia - chronic (baseline 8gm-9gm% in oct 2023) Hgb at baseline, slightly trending up. No active bleeding noted. -trend hgb curve, transfuse if hgb <7 and/or hemodynamic instability  Hyperglycemia, uncontrolled Patient currently receiving pulse dose steroids and D5W maintenance. Also advancing TF to goal as tolerated. CBGs elevated in the 200s overnight. Anticipate increasing insulin requirements. -resistant SSI -increased TF coverage to novolog 6u q4h -may need to add basal coverage as well if remain uncontrolled -goal BG 140-180  ILEUS - resolved Having good stool output in flexiseal. Advancing TF to goal as tolerated.  -advance TF to goal as tolerated -lactulose BID per tube  Severe physical deconditioning Protein caloric malnutrition, mild Advancing TF to goal as tolerated. - She will need aggressive PT/OT (likely CIR) for strength training.   Best Practice (right click and "Reselect all SmartList Selections" daily)   Diet/type: tubefeeds  DVT prophylaxis: prophylactic heparin  GI prophylaxis: PPI Lines: Central line - RUE PICC since 06/12/22 Foley:  Yes, and it is still needed Code Status:  full code Last date of multidisciplinary goals of care discussion [updated mother at bedside 06/22/2022]   Virl Axe, MD 06/22/2022, 1:20 PM    LABS   CBC Recent Labs  Lab 06/20/22 0352 06/21/22 0409 06/22/22 0304  HGB 8.4* 8.2* 8.5*  HCT 26.5* 25.6* 26.2*  WBC 16.6* 14.0* 22.1*  PLT 224 187 158     CHEMISTRY Recent Labs  Lab 06/15/22 2052 06/15/22 2107 06/16/22 0335 06/16/22 0826 06/17/22 0253 06/18/22 0338 06/18/22 0527 06/18/22 0954 06/19/22 0402 06/19/22 2025 06/20/22 0352 06/21/22 0409 06/22/22 0304  NA 150*   < > 148*   < > 141   < > 140  --  142  --  143 150* 153*  K 4.6   < > 4.7   < > 5.3*   < > 2.5*   < > 3.1*   < > 3.9 3.5 3.0*  CL 104  --  102   < > 94*   < > 96*  --  102  --  109 111 115*  CO2 31  --  31   < > 26   < > 24  --  23  --  21* 21* 21*  GLUCOSE 129*  --  144*   < > 129*   < > 135*  --  147*  --  132* 210* 248*  BUN 70*  --  76*   < > 74*   < > 67*  --  59*  --  71* 82* 100*  CREATININE 1.88*  --  2.30*   < > 2.61*   < > 2.08*  --  1.83*  --  1.83* 1.99* 2.22*  CALCIUM 9.3  --  9.1   < > 8.3*   < > 7.4*  --  7.3*  --  7.5* 8.3* 8.9  MG 3.0*  --  2.7*  --  2.8*  --   --   --  2.6*  --   --   --   --   PHOS  --   --  6.1*  --  8.1*  --   --   --   --   --   --   --   --    < > = values in this interval not displayed.   Estimated Creatinine Clearance: 27.9 mL/min (A) (by C-G formula based on SCr of 2.22 mg/dL (H)).   LIVER Recent Labs  Lab 06/16/22 0335 06/17/22 0253 06/18/22 0338 06/19/22 0402 06/20/22 0352 06/20/22 1141 06/21/22 0409 06/22/22 0304  AST 62*   < > 51* 42* 40  --  47* 67*  ALT 21   < > 20 20 22   --  20 21  ALKPHOS 108   < > 78 71 87  --  81 89  BILITOT 2.8*   < > 3.4* 4.5* 4.9* 4.7* 4.7* 4.0*  PROT 7.1   < > 6.5 6.8 6.4*  --  6.3* 6.2*  ALBUMIN 2.9*   < > 3.3* 3.3* 2.9*  --  2.8* 2.7*  INR 1.4*  --   --   --   --   --   --   --    < > = values in this interval not displayed.    ENDOCRINE CBG (last 3)  Recent Labs    06/22/22 0304 06/22/22 0801 06/22/22 1155  GLUCAP 198* 200* 219*      IMAGING x48h  - image(s) personally visualized  -   highlighted in bold CT CHEST WO CONTRAST  Result Date: 06/21/2022 CLINICAL DATA:  Concern for malignancy EXAM: CT CHEST WITHOUT CONTRAST TECHNIQUE: Multidetector CT  imaging of the chest was performed following the standard protocol without IV contrast. RADIATION DOSE REDUCTION: This exam was performed according to the departmental dose-optimization program which includes automated exposure control, adjustment of the mA and/or kV according to patient size and/or use of iterative reconstruction technique. COMPARISON:  Abdomen and pelvis CT dated June 15, 2022 FINDINGS: Cardiovascular: Normal heart size. Pericardial effusion. Caliber thoracic aorta with no atherosclerotic disease. Right central venous line tip in the right atrium. Small hiatal hernia. Mediastinum/Nodes: No pathologically enlarged lymph nodes seen in the chest. Small hiatal hernia. Thyroid is unremarkable. Lungs/Pleura: Bronchus intermedius is occluded with complete of the right middle and right lower lobe. Complete collapse of the left lower lobe and small left pleural effusion. Patchy upper lobe predominant ground-glass opacities and scattered nodular opacities. Part solid nodule of the right upper lobe measuring 6 mm with 3 mm solid component and part solid nodule of the right lower lobe measuring up to 9 mm with 7 mm solid component on series 4, image 75. Upper Abdomen: Cirrhotic liver morphology, upper abdominal varices and small volume ascites. Musculoskeletal: No chest wall mass or suspicious bone lesions identified. IMPRESSION: 1. Patchy upper lobe predominant ground-glass opacities and scattered nodular opacities, findings are likely infectious  or inflammatory, a component of pulmonary edema is also possible. Non-contrast chest CT at 3-6 months is recommended. If nodules persist, subsequent management will be based upon the most suspicious nodule(s). This recommendation follows the consensus statement: Guidelines for Management of Incidental Pulmonary Nodules Detected on CT Images: From the Fleischner Society 2017; Radiology 2017; 284:228-243. 2. Bronchus intermedius is occluded with complete of the  right middle and right lower lobe, likely due to mucous plugging. 3. Small left pleural effusion with complete collapse of the left lower lobe. 4. Partially visualized upper abdomen with cirrhotic liver morphology, upper abdominal varices and small volume ascites. Electronically Signed   By: Yetta Glassman M.D.   On: 06/21/2022 08:37   DG FL GUIDED LUMBAR PUNCTURE  Result Date: 06/20/2022 CLINICAL DATA:  Patient with listeria meningitis, encephalopathy, 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 Previous fluoroscopically guided lumbar puncture 06/01/22, 06/18/22. Request for repeat lumbar puncture to further evaluate. EXAM: LUMBAR PUNCTURE UNDER FLUOROSCOPY PROCEDURE: An appropriate skin entry site was determined fluoroscopically. Operator donned sterile gloves and mask. Skin site was marked, then prepped with Betadine, draped in usual sterile fashion, and infiltrated locally with 1% lidocaine. A 20 gauge spinal needle advanced into the thecal sac at L3-4 level. Hazy yellow CSF spontaneously returned, with opening pressure of 23 cm water. 20 ml CSF were collected and divided among 4 sterile vials for the requested laboratory studies. Closing pressure 12 cm water The needle was then removed. The patient tolerated the procedure well and there were no complications. FLUOROSCOPY: Radiation Exposure Index (as provided by the fluoroscopic device): 8.10 mGy Kerma IMPRESSION: Technically successful lumbar puncture under fluoroscopy. This exam was performed by Candiss Norse, PA-C, and was supervised and interpreted by Nelson Chimes, MD. Electronically Signed   By: Nelson Chimes M.D.   On: 06/20/2022 14:15

## 2022-06-22 NOTE — Progress Notes (Signed)
Neurology Progress Note  Major interval events/Subjective: - awake today but no purposeful movement, not following commands for me - febrile overnight to tmax 101.3 - day 3 of 5 of high dose steroids, worsening leukocytosis in this setting - On low dose levophed - No seizures overnight  Elevated IgG index 39.2 supports CNS inflammatory process  Current Facility-Administered Medications:    0.9 %  sodium chloride infusion, , Intravenous, PRN, Julian Hy, DO, Last Rate: 10 mL/hr at 06/22/22 0400, New Bag at 06/22/22 0400   acetaminophen (TYLENOL) 160 MG/5ML solution 650 mg, 650 mg, Per Tube, Q6H PRN, Judd Lien, MD, 650 mg at 06/21/22 2015   Chlorhexidine Gluconate Cloth 2 % PADS 6 each, 6 each, Topical, Daily, Candee Furbish, MD, 6 each at 06/21/22 2200   dextrose 5 % solution, , Intravenous, Continuous, Virl Axe, MD, Last Rate: 75 mL/hr at 06/22/22 1300, Infusion Verify at 06/22/22 1300   feeding supplement (PROSource TF20) liquid 60 mL, 60 mL, Per Tube, Daily, Meuth, Brooke A, PA-C, 60 mL at 06/22/22 0954   feeding supplement (VITAL 1.5 CAL) liquid 1,000 mL, 1,000 mL, Per Tube, Continuous, Meuth, Brooke A, PA-C, Last Rate: 55 mL/hr at 06/22/22 1300, Infusion Verify at 06/22/22 1300   fentaNYL (SUBLIMAZE) injection 50-200 mcg, 50-200 mcg, Intravenous, Q30 min PRN, Chesley Mires, MD, 50 mcg at 16/10/96 0454   folic acid (FOLVITE) tablet 1 mg, 1 mg, Per Tube, Daily, Ventura Sellers, RPH, 1 mg at 06/22/22 0981   free water 200 mL, 200 mL, Per Tube, Q4H, Virl Axe, MD, 200 mL at 06/22/22 1242   heparin injection 5,000 Units, 5,000 Units, Subcutaneous, Q8H, Corey Harold, NP, 5,000 Units at 06/22/22 1446   insulin aspart (novoLOG) injection 0-20 Units, 0-20 Units, Subcutaneous, Q4H, Virl Axe, MD, 7 Units at 06/22/22 1242   insulin aspart (novoLOG) injection 6 Units, 6 Units, Subcutaneous, Q4H, Rigoberto Noel, MD, 6 Units at 06/22/22 1242   lactated ringers infusion,  , Intravenous, Continuous, Virl Axe, MD, Stopped at 06/22/22 1239   lactulose ([place]) 10 GM/15ML solution 20 g, 20 g, Per Tube, TID, Virl Axe, MD, 20 g at 06/22/22 0953   leptospermum manuka honey (MEDIHONEY) paste 1 Application, 1 Application, Topical, Daily, Brand Males, MD, 1 Application at 19/14/78 1006   levETIRAcetam (KEPPRA) tablet 500 mg, 500 mg, Per Tube, BID, Ventura Sellers, RPH, 500 mg at 06/22/22 2956   lip balm (CARMEX) ointment, , Topical, PRN, Virl Axe, MD   meropenem (MERREM) 1 g in sodium chloride 0.9 % 100 mL IVPB, 1 g, Intravenous, Q12H, Sinclair, Emily S, RPH, Paused at 06/22/22 1039   [COMPLETED] methylPREDNISolone sodium succinate (SOLU-MEDROL) 1,000 mg in sodium chloride 0.9 % 50 mL IVPB, 1,000 mg, Intravenous, Once, Stopped at 06/20/22 2331 **FOLLOWED BY** [COMPLETED] methylPREDNISolone sodium succinate (SOLU-MEDROL) 1,000 mg in sodium chloride 0.9 % 50 mL IVPB, 1,000 mg, Intravenous, Once, Stopped at 06/21/22 1909 **FOLLOWED BY** [COMPLETED] methylPREDNISolone sodium succinate (SOLU-MEDROL) 1,000 mg in sodium chloride 0.9 % 50 mL IVPB, 1,000 mg, Intravenous, Once, Last Rate: 66 mL/hr at 06/22/22 1300, Infusion Verify at 06/22/22 1300 **FOLLOWED BY** [START ON 06/23/2022] methylPREDNISolone sodium succinate (SOLU-MEDROL) 1,000 mg in sodium chloride 0.9 % 50 mL IVPB, 1,000 mg, Intravenous, Once **FOLLOWED BY** [START ON 06/24/2022] methylPREDNISolone sodium succinate (SOLU-MEDROL) 1,000 mg in sodium chloride 0.9 % 50 mL IVPB, 1,000 mg, Intravenous, Once, Bhagat, Srishti L, MD   midodrine (PROAMATINE) tablet 10 mg, 10 mg, Per Tube, Q8H, Pierce,  Dwayne A, RPH, 10 mg at 06/22/22 1446   norepinephrine (LEVOPHED) 16 mg in 260mL (0.064 mg/mL) premix infusion, 0-40 mcg/min, Intravenous, Titrated, Mannam, Praveen, MD, Last Rate: 3.75 mL/hr at 06/22/22 1300, 4 mcg/min at 06/22/22 1300   Oral care mouth rinse, 15 mL, Mouth Rinse, Q2H, Sood, Vineet, MD, 15 mL at  06/22/22 1447   Oral care mouth rinse, 15 mL, Mouth Rinse, PRN, Chesley Mires, MD   pantoprazole (PROTONIX) injection 40 mg, 40 mg, Intravenous, Q24H, Sood, Vineet, MD, 40 mg at 06/21/22 2119   rifaximin (XIFAXAN) tablet 550 mg, 550 mg, Per Tube, BID, Brand Males, MD, 550 mg at 06/22/22 0954   sodium chloride flush (NS) 0.9 % injection 10-40 mL, 10-40 mL, Intracatheter, Q12H, Clark, Laura P, DO, 10 mL at 06/22/22 1009   sodium chloride flush (NS) 0.9 % injection 10-40 mL, 10-40 mL, Intracatheter, PRN, Noemi Chapel P, DO   thiamine (VITAMIN B1) tablet 100 mg, 100 mg, Per Tube, Daily, Ventura Sellers, RPH, 100 mg at 06/22/22 7902   white petrolatum (VASELINE) gel, , Topical, PRN, Julian Hy, DO, 1 Application at 40/97/35 0809  Current vital signs: BP (!) 101/49   Pulse 92   Temp 100.1 F (37.8 C) (Axillary)   Resp (!) 27   Wt 66.9 kg   SpO2 98%   BMI 25.32 kg/m  Vital signs in last 24 hours: Temp:  [98.8 F (37.1 C)-101.3 F (38.5 C)] 100.1 F (37.8 C) (12/01 1157) Pulse Rate:  [77-109] 92 (12/01 1415) Resp:  [17-36] 27 (12/01 1415) BP: (94-135)/(43-67) 101/49 (12/01 1415) SpO2:  [93 %-99 %] 98 % (12/01 1415) FiO2 (%):  [40 %] 40 % (12/01 1158) Weight:  [66.9 kg] 66.9 kg (12/01 0500)   Physical Exam  Constitutional: Appears chronically ill  Psych: Minimally interactive Eyes: Scleral edema is minimal HENT: ET tube in place MSK: no joint deformities.  Cardiovascular: Mildly tachy Respiratory: Breathing comfortably on the ventilator,  GI: Mildly distended Skin: Scattered bruising throughout, some icterus   Neuro: Mental Status: Opens eyes to voice today, orients to examiner on the right, but not to the left. Does not follow commands. Cranial Nerves: Blinks to threat bilaterally.  Pupils are equal, round, and reactive to light.   Orients eyes to examiner's voice to the right but not to the left Intact cough Spontaneously protrudes tongue just prior to coughing,  grossly midline within limits of ET tube Motor/Sensory: No spontaneous movement today. Flicker of response to noxious stimulation in the right upper extremity but not elsewhere Cerebellar: Unable to assess secondary to patient's mental status    Pertinent Labs:  Basic Metabolic Panel: Recent Labs  Lab 06/15/22 2052 06/15/22 2107 06/16/22 0335 06/16/22 0826 06/17/22 0253 06/18/22 0338 06/19/22 0402 06/19/22 2025 06/20/22 0352 06/21/22 0409 06/22/22 0304 06/22/22 1249  NA 150*   < > 148*   < > 141   < > 142  --  143 150* 153* 149*  K 4.6   < > 4.7   < > 5.3*   < > 3.1* 3.4* 3.9 3.5 3.0* 3.2*  CL 104  --  102   < > 94*   < > 102  --  109 111 115* 111  CO2 31  --  31   < > 26   < > 23  --  21* 21* 21* 19*  GLUCOSE 129*  --  144*   < > 129*   < > 147*  --  132*  210* 248* 410*  BUN 70*  --  76*   < > 74*   < > 59*  --  71* 82* 100* 99*  CREATININE 1.88*  --  2.30*   < > 2.61*   < > 1.83*  --  1.83* 1.99* 2.22* 2.27*  CALCIUM 9.3  --  9.1   < > 8.3*   < > 7.3*  --  7.5* 8.3* 8.9 9.0  MG 3.0*  --  2.7*  --  2.8*  --  2.6*  --   --   --   --   --   PHOS  --   --  6.1*  --  8.1*  --   --   --   --   --   --   --    < > = values in this interval not displayed.     CBC: Recent Labs  Lab 06/18/22 1531 06/19/22 0402 06/20/22 0352 06/21/22 0409 06/22/22 0304  WBC 16.5* 15.0* 16.6* 14.0* 22.1*  NEUTROABS  --  12.0* 13.2* 12.3* 18.5*  HGB 8.7* 8.8* 8.4* 8.2* 8.5*  HCT 26.7* 27.8* 26.5* 25.6* 26.2*  MCV 112.7* 115.4* 116.2* 114.8* 113.9*  PLT 255 242 224 187 158     Coagulation Studies: No results for input(s): "LABPROT", "INR" in the last 72 hours.   CSF Opening pressure 11/27 25, closing pressure 19 Opening pressure 11/29 23, closing pressure 12  Latest Reference Range & Units 06/01/22 13:43 06/18/22 11:04  Appearance, CSF CLEAR  CLOUDY ! HAZY !  Glucose, CSF 40 - 70 mg/dL 86 (H) 94 (H) -- serum 193 48% of serum  RBC Count, CSF 0 /cu mm 26,400 (H) 5,000 (H)  WBC, CSF  0 - 5 /cu mm 80 (HH) 191 (HH)  Segmented Neutrophils-CSF 0 - 6 % 37 (H) 3  Lymphs, CSF 40 - 80 % 53 92 (H)  Monocyte-Macrophage-Spinal Fluid 15 - 45 % 9 (L) 5 (L)  Eosinophils, CSF 0 - 1 % 1 0  Color, CSF COLORLESS  RED ! AMBER !  Supernatant  XANTHOCHROMIC XANTHOCHROMIC  Total  Protein, CSF 15 - 45 mg/dL >600 (H) >600 (H)  (HH): Data is critically high; !: Data is abnormal; (H): Data is abnormally high; (L): Data is abnormally low   CSF 11/30: RBC 5725/1800, WBC 61/88 (lymphocytic predominance), xanthochromic with protein of 423 and glucose of 92 (serum 141)  Autoimmune labs:  Thyroperoxidase Ab SerPl-aCnc 0 - 34 IU/mL 22  Thyroglobulin Antibody 0.0 - 0.9 IU/mL     Additional serum labs TSH: 0.095 (low) fT4 0.72 (wnl) HIV neg   EEG  generalized continuous slowing previously, read today pending MRI brain w/o contrast 06/17/2022 personally reviewed, agree with radiology:   No acute intracranial pathology or epileptogenic focus identified.   MRI brain and C-spine 06/20/2022 1. Negative for acute infarct or mass. 2. Diffuse pachymeningeal enhancement and mild thickening. Sulcal hyperintensity on FLAIR has progressed in the interval. Mild pituitary enlargement for age unchanged. The mamillopontine distance is narrowed at 4.3 mm. Findings can be seen with intracranial hypotension however some of these findings can also be seen with meningitis. 3. No evidence of spinal infection in the cervical spine. Cervical spondylosis causing foraminal narrowing as described above.  CT abdomen pelvis w/o contrast 06/15/22 Multiple dilated loops of small bowel with a transition zone in the mid to distal ileum consistent with a least partial small bowel obstruction. Bibasilar consolidation with left-sided pleural effusions stable from the  prior CT. Cirrhotic changes of the liver with ascites. Ascites may also be in part due to the underlying obstructive change.    CT chest 11/30 a.m.  formal read pending but on personal review with CCM team there is some concern for pneumonia   Assessment: With initiation of steroids, patient is slightly improved, though stable exam from yesterday.  Extended discussion with mom at bedside yesterday about the guarded prognosis, with high risk of worsening infection due to immunosuppression with pulse dose steroids and current concern for pneumonia, possibly ventilator associated. No family was at bedside today.  Other considerations for infectious causes resulting in the patient's CSF findings other than Listeria would be HSV/VZV versus TB.  At this time low concern for TB based on CT chest (in discussion with CCM team), as she has had negative HSV/VZV PCR but I have added on IgG for confirmation.  If she initially improves with steroids but then worsens and VZV IgG has not resulted may consider acyclovir or would consider it if infectious disease feels it is prudent to start at this time.  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 CSF contamination with blood, appreciate ID following), improving -Concern for ventilator associated pneumonia on chest CT 11/30 -Nonoliguric AKI likely secondary to ATN associated with shock, initially improved now worsening, appreciate nephrology  involvement -Alcoholic cirrhosis vs. Potential for autoimmune component  -Ileus/concern for partial small bowel obstruction, appreciate surgery following -Acute respiratory failure due to shock AKI and encephalopathy -Prolonged Qtc -Chronic anemia  Recommendations: # Encephalopathy # CSF Inflammatory process, infectious vs. autoimmune -Follow-up pending repeat CSF studies from 11/29 oligoclonal bands, bacterial culture, fungal culture, anaerobic culture, VDRL, CSF flow cytometry, CSF cytopathology, autoimmune CSF encephalitis panel, VZV IgG CSF -Continue pulse dose steroids 1 g daily for 5 days, keep 20 hours given first dose was at 2230 on 11/29, complete dosing on 12/3 -Follow-up serum autoimmune studies: Lupus anticoagulant panel, ANA with reflex if positive, anti-Jo1, SSA, SSB, thyroid antibodies, ANCA profile, anti-smooth muscle antibody, mitochondrial antibodies, rheumatoid factor, anti-DS DNA, oligoclonal bands, autoimmune serum encephalitis panel -Follow up additional serological workup: RPR   # Clinical concern for seizure, seizure free on Keppra - EEG d/c'd yesterday - s/p Keppra 3500 mg load, then 1000 mg BID; given renal function and somnolence reduced to 500 mg BID on 11/27, will continue to adjust as needed given fluctuating renal function, dose ranges for reference: Estimated Creatinine Clearance: 27.3 mL/min (A) (by C-G formula based on SCr of 2.27 mg/dL (H)).   CrCl 80 to 130 mL/minute/1.73 m2: 500 mg to 1.5 g every 12 hours.  CrCl 50 to <80 mL/minute/1.73 m2: 500 mg to 1 g every 12 hours.  CrCl 30 to <50 mL/minute/1.73 m2: 250 to 750 mg every 12 hours.  CrCl 15 to <30 mL/minute/1.73 m2: 250 to 500 mg every 12 hours.  CrCl <15 mL/minute/1.73 m2: 250 to 500 mg every 24 hours (expert opinion).  Su Monks, MD [facility] [phone]  If 7pm- 7am, please page neurology on call as listed in [place].   CRITICAL CARE Performed by: Derek Jack   Total  critical care time: 40 minutes  Critical care time was exclusive of separately billable procedures and treating other patients.  Critical care was necessary to treat or prevent imminent or life-threatening deterioration.  Critical care was time spent personally by me on the following activities: development of treatment plan with patient and/or surrogate as well as nursing, discussions with consultants, evaluation of patient's response to treatment, examination of patient, obtaining history from patient or surrogate, ordering and performing treatments and interventions, ordering and review of laboratory studies, ordering and review of radiographic studies, pulse oximetry and re-evaluation of patient's condition.

## 2022-06-22 NOTE — Progress Notes (Addendum)
[facility] for Infectious Disease    Date of Admission:  05/28/2022   Total days of antibiotics 21  ID: Deanna Mcmillan is a 51 y.o. 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 Having ongoing fevers  Principal Problem:   Acute respiratory failure with hypoxia (HCC) Active Problems:   Pressure injury of skin   Toxic metabolic encephalopathy   On mechanically assisted ventilation (HCC)   Sepsis with acute hypoxic respiratory failure (HCC)   Listeriosis   Acute pulmonary edema (HCC)   Septic shock (HCC)   Compromised airway   MSSA bacteremia   Small bowel obstruction (HCC)   Subjective: Mental status relatively unchanged Per mother patient has stayed more awake since yesterday Denies nasal pain but does not track or follow any commands Worsening of her kidney function overnight Last fever of 101.3 around 8 PM last night  Medications:   Chlorhexidine Gluconate Cloth  6 each Topical Daily   feeding supplement (PROSource TF20)  60 mL Per Tube Daily   folic acid  1 mg Per Tube Daily   free water  200 mL Per Tube Q4H   heparin  5,000 Units Subcutaneous Q8H   insulin aspart  0-20 Units Subcutaneous Q4H   insulin aspart  2 Units Subcutaneous Q4H   lactulose  20 g Per Tube TID   leptospermum manuka honey  1 Application Topical Daily   levETIRAcetam  500 mg Per Tube BID   midodrine  10 mg Per Tube Q8H   mouth rinse  15 mL Mouth Rinse Q2H   pantoprazole (PROTONIX) IV  40 mg Intravenous Q24H   rifaximin  550 mg Per Tube BID   sodium chloride flush  10-40 mL Intracatheter Q12H   thiamine  100 mg Per Tube Daily    Objective: Vital signs in last 24 hours: Temp:  [98.8 F (37.1 C)-101.3 F (38.5 C)] 99.8 F (37.7 C) (12/01 0802) Pulse Rate:  [77-109] 86 (12/01 0845) Resp:  [19-36] 24 (12/01 0845) BP: (94-135)/(46-67) 103/56 (12/01 0845) SpO2:  [94 %-97 %] 94 % (12/01 0845) FiO2  (%):  [40 %] 40 % (12/01 0817) Weight:  [66.9 kg] 66.9 kg (12/01 0500)  Physical Exam  General: Acutely ill middle-aged woman laying in bed. No acute distress. HEENT: ET tube stable in place. Mild conjunctival edema, improving CV: Tachycardic. Regular rhythm. No murmurs, rubs, or gallops. No LE edema Pulmonary: Mechanical lung sounds. Decreased air movement. Abdominal: Soft. Non-distended. Hypoactive bowel sounds. Extremities: Well-perfused. Palpable pulses. Skin: Warm and dry. Scattered ecchymosis, BUE > BLE Neuro: Off sedation. Somnolent, but opens eyes spontaneously. Still does not follow commands.   Lab Results Recent Labs    06/21/22 0409 06/22/22 0304  WBC 14.0* 22.1*  HGB 8.2* 8.5*  HCT 25.6* 26.2*  NA 150* 153*  K 3.5 3.0*  CL 111 115*  CO2 21* 21*  BUN 82* 100*  CREATININE 1.99* 2.22*   Liver Panel Recent Labs    06/20/22 1141 06/21/22 0409 06/22/22 0304  PROT  --  6.3* 6.2*  ALBUMIN  --  2.8* 2.7*  AST  --  47* 67*  ALT  --  20 21  ALKPHOS  --  81 89  BILITOT 4.7* 4.7* 4.0*  BILIDIR 2.5*  --   --   IBILI 2.2*  --   --    Sedimentation Rate No results for input(s): "ESRSEDRATE" in the last 72 hours.  C-Reactive Protein No results for input(s): "CRP" in the last 72 hours.  Microbiology: 11/24 >> blood culture NG after 5 days 11/25 >> tracheal aspirate rare Klebsiella oxytoca 11/27 >> CSF culture NGTD  Antibiotics:  11/28-c meropenem 11/20-c rifaximine 11/08-c amp 11/24-c linezolid   11/08-24 gentamicin 11/11-22 cefazolin 11/08-11 ceftriaxone 11/07-08 vanc/cefepime/flagyl  Studies/Results: CT CHEST WO CONTRAST  Result Date: 06/21/2022 CLINICAL DATA:  Concern for malignancy EXAM: CT CHEST WITHOUT CONTRAST TECHNIQUE: Multidetector CT imaging of the chest was performed following the standard protocol without IV contrast. RADIATION DOSE REDUCTION: This exam was performed according to the departmental dose-optimization program which includes  automated exposure control, adjustment of the mA and/or kV according to patient size and/or use of iterative reconstruction technique. COMPARISON:  Abdomen and pelvis CT dated June 15, 2022 FINDINGS: Cardiovascular: Normal heart size. Pericardial effusion. Caliber thoracic aorta with no atherosclerotic disease. Right central venous line tip in the right atrium. Small hiatal hernia. Mediastinum/Nodes: No pathologically enlarged lymph nodes seen in the chest. Small hiatal hernia. Thyroid is unremarkable. Lungs/Pleura: Bronchus intermedius is occluded with complete of the right middle and right lower lobe. Complete collapse of the left lower lobe and small left pleural effusion. Patchy upper lobe predominant ground-glass opacities and scattered nodular opacities. Part solid nodule of the right upper lobe measuring 6 mm with 3 mm solid component and part solid nodule of the right lower lobe measuring up to 9 mm with 7 mm solid component on series 4, image 75. Upper Abdomen: Cirrhotic liver morphology, upper abdominal varices and small volume ascites. Musculoskeletal: No chest wall mass or suspicious bone lesions identified. IMPRESSION: 1. Patchy upper lobe predominant ground-glass opacities and scattered nodular opacities, findings are likely infectious or inflammatory, a component of pulmonary edema is also possible. Non-contrast chest CT at 3-6 months is recommended. If nodules persist, subsequent management will be based upon the most suspicious nodule(s). This recommendation follows the consensus statement: Guidelines for Management of Incidental Pulmonary Nodules Detected on CT Images: From the Fleischner Society 2017; Radiology 2017; 284:228-243. 2. Bronchus intermedius is occluded with complete of the right middle and right lower lobe, likely due to mucous plugging. 3. Small left pleural effusion with complete collapse of the left lower lobe. 4. Partially visualized upper abdomen with cirrhotic liver  morphology, upper abdominal varices and small volume ascites. Electronically Signed   By: Yetta Glassman M.D.   On: 06/21/2022 08:37   DG FL GUIDED LUMBAR PUNCTURE  Result Date: 06/20/2022 CLINICAL DATA:  Patient with listeria meningitis, encephalopathy, 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 Previous fluoroscopically guided lumbar puncture 06/01/22, 06/18/22. Request for repeat lumbar puncture to further evaluate. EXAM: LUMBAR PUNCTURE UNDER FLUOROSCOPY PROCEDURE: An appropriate skin entry site was determined fluoroscopically. Operator donned sterile gloves and mask. Skin site was marked, then prepped with Betadine, draped in usual sterile fashion, and infiltrated locally with 1% lidocaine. A 20 gauge spinal needle advanced into the thecal sac at L3-4 level. Hazy yellow CSF spontaneously returned, with opening pressure of 23 cm water. 20 ml CSF were collected and divided among 4 sterile vials for the requested laboratory studies. Closing pressure 12 cm water The needle was then removed. The patient tolerated the procedure well and there were no complications. FLUOROSCOPY: Radiation Exposure Index (as provided by the fluoroscopic device): 8.10 mGy Kerma IMPRESSION: Technically successful lumbar puncture under fluoroscopy. This exam was performed by Candiss Norse, PA-C, and was supervised and interpreted by Nelson Chimes, MD. Electronically Signed   By: Jan Fireman.D.  On: 06/20/2022 14:15     Assessment/Plan: YOHANA BARTHA is a 51 y.o. 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  #Persistent encephalopathy/fevers #Listeria bacteremia/meningitis/MSSA bacteremia Repeat brain MRI shows progression of the sulcal hyperintensity on FLAIR as well as meningeal enhancement and mild thickening. Fever curve not significantly improved, no fevers in the last 16 hrs. White count trending up likely from demargination from pulse  dose steroids. Autoimmune workup so far has been unrevealing. Negative cryptococcal antigen and negative CSF fungal culture. CSF culture still no growth after 2 days. Pending further send out studies. Will continue broad-spectrum antibiotics for total 7 days before de-escalating. Prognosis remains guarded. -F/u autoimmune CSF encephalitis panel -F/u oligoclonal bands, VDRL, CSF flow cytometry, CSF cytopathology -Continue meropenem to complete 7-day course before narrowing again to target Listeria -Continue pulse dose steroids -Trend fever curve/WBC  #Hepatic encephalopathy #Alcoholic hepatitis #Concern for autoimmune hepatitis Autoimmune workup so far unremarkable with negative ANCA profile, lupus anticoagulant, IgG, cardiolipin antibodies, ASMA, AMA, ANA, anti-Jo 1 Ab, thyroid Ab, extractable nuclear antigen ab, RPR, HIV. Low concern for primary biliary cirrhosis, autoimmune hepatitis, antiphospholipid syndrome, polymyositis, thyroid dysfunction or sjogren's syndrome -Continue rifaximin and lactulose -F/u ANA w/ reflex, oligoclonal bands, autoimmune serum encephalitis panel   #Kleb oxytoca on trach aspirate #HAP Repeat chest x-ray shows persistent perihilar pulmonary infiltrates. CT chest patchy upper lobe groundglass opacities scattered nodules concerning for infectious versus inflammatory. Continues to remain on full vent support. -Continue meropenem as above -Vent management per PCCM  #Septic shock/AHRF/AKI: Per primary team #SBO/ileus: Improved. CCS signed off #Seizures: Neurology following   ID will continue to follow  Lacinda Axon, MD 06/22/2022, 8:59 AM IM Resident, PGY-3 Oswaldo Milian 41:10

## 2022-06-22 DEATH — deceased

## 2022-06-23 DIAGNOSIS — J9601 Acute respiratory failure with hypoxia: Secondary | ICD-10-CM | POA: Diagnosis not present

## 2022-06-23 DIAGNOSIS — G9340 Encephalopathy, unspecified: Secondary | ICD-10-CM | POA: Diagnosis not present

## 2022-06-23 DIAGNOSIS — N179 Acute kidney failure, unspecified: Secondary | ICD-10-CM

## 2022-06-23 LAB — CBC
HCT: 25.8 % — ABNORMAL LOW (ref 36.0–46.0)
Hemoglobin: 8.4 g/dL — ABNORMAL LOW (ref 12.0–15.0)
MCH: 37 pg — ABNORMAL HIGH (ref 26.0–34.0)
MCHC: 32.6 g/dL (ref 30.0–36.0)
MCV: 113.7 fL — ABNORMAL HIGH (ref 80.0–100.0)
Platelets: 127 10*3/uL — ABNORMAL LOW (ref 150–400)
RBC: 2.27 MIL/uL — ABNORMAL LOW (ref 3.87–5.11)
RDW: 16.1 % — ABNORMAL HIGH (ref 11.5–15.5)
WBC: 24.2 10*3/uL — ABNORMAL HIGH (ref 4.0–10.5)
nRBC: 3.5 % — ABNORMAL HIGH (ref 0.0–0.2)

## 2022-06-23 LAB — COMPREHENSIVE METABOLIC PANEL
ALT: 23 U/L (ref 0–44)
AST: 80 U/L — ABNORMAL HIGH (ref 15–41)
Albumin: 2.6 g/dL — ABNORMAL LOW (ref 3.5–5.0)
Alkaline Phosphatase: 105 U/L (ref 38–126)
Anion gap: 13 (ref 5–15)
BUN: 113 mg/dL — ABNORMAL HIGH (ref 6–20)
CO2: 19 mmol/L — ABNORMAL LOW (ref 22–32)
Calcium: 8.7 mg/dL — ABNORMAL LOW (ref 8.9–10.3)
Chloride: 115 mmol/L — ABNORMAL HIGH (ref 98–111)
Creatinine, Ser: 2.42 mg/dL — ABNORMAL HIGH (ref 0.44–1.00)
GFR, Estimated: 23 mL/min — ABNORMAL LOW (ref >60)
Glucose, Bld: 249 mg/dL — ABNORMAL HIGH (ref 70–99)
Potassium: 3 mmol/L — ABNORMAL LOW (ref 3.5–5.1)
Sodium: 147 mmol/L — ABNORMAL HIGH (ref 135–145)
Total Bilirubin: 3.4 mg/dL — ABNORMAL HIGH (ref 0.3–1.2)
Total Protein: 6.4 g/dL — ABNORMAL LOW (ref 6.5–8.1)

## 2022-06-23 LAB — CSF CULTURE W GRAM STAIN
Culture: NO GROWTH
Gram Stain: NONE SEEN

## 2022-06-23 LAB — ANA W/REFLEX IF POSITIVE: Anti Nuclear Antibody (ANA): NEGATIVE

## 2022-06-23 LAB — GLUCOSE, CAPILLARY
Glucose-Capillary: 246 mg/dL — ABNORMAL HIGH (ref 70–99)
Glucose-Capillary: 217 mg/dL — ABNORMAL HIGH (ref 70–99)
Glucose-Capillary: 236 mg/dL — ABNORMAL HIGH (ref 70–99)
Glucose-Capillary: 233 mg/dL — ABNORMAL HIGH (ref 70–99)
Glucose-Capillary: 252 mg/dL — ABNORMAL HIGH (ref 70–99)
Glucose-Capillary: 233 mg/dL — ABNORMAL HIGH (ref 70–99)

## 2022-06-23 LAB — POTASSIUM: Potassium: 3.4 mmol/L — ABNORMAL LOW (ref 3.5–5.1)

## 2022-06-23 LAB — MAGNESIUM: Magnesium: 2.6 mg/dL — ABNORMAL HIGH (ref 1.7–2.4)

## 2022-06-23 MED ORDER — POTASSIUM CHLORIDE 20 MEQ PO PACK
40.0000 meq | PACK | Freq: Once | ORAL | Status: AC
  Administered 2022-06-23: 40 meq via ORAL
  Filled 2022-06-23: qty 2

## 2022-06-23 MED ORDER — MEDIHONEY WOUND/BURN DRESSING EX PSTE
1.0000 | PASTE | Freq: Every day | CUTANEOUS | Status: DC
  Administered 2022-06-23 – 2022-07-03 (×10): 1 via TOPICAL
  Filled 2022-06-23: qty 44

## 2022-06-23 MED ORDER — INSULIN DETEMIR 100 UNIT/ML ~~LOC~~ SOLN
10.0000 [IU] | Freq: Two times a day (BID) | SUBCUTANEOUS | Status: DC
  Administered 2022-06-23 – 2022-06-24 (×3): 10 [IU] via SUBCUTANEOUS
  Filled 2022-06-23 (×4): qty 0.1

## 2022-06-23 MED ORDER — POTASSIUM CHLORIDE 20 MEQ PO PACK
20.0000 meq | PACK | Freq: Once | ORAL | Status: AC
  Administered 2022-06-23: 20 meq via ORAL
  Filled 2022-06-23: qty 1

## 2022-06-23 NOTE — Progress Notes (Signed)
NAME:  Deanna Mcmillan, MRN:  [id number], DOB:  04-Dec-1970, LOS: 61 ADMISSION DATE:  05/28/2022, CONSULTATION DATE:  11/7 REFERRING MD:  Lendon Colonel, EDP CHIEF COMPLAINT:  AMS   History of Present Illness:  51 yo woman with hx of recent diagnosis of alcoholic hepatitis (dx 78/46), here with ams, fever.   History from her mother Deanna Mcmillan.  Patient was in her normal state of health, until suddenly the night PTA she developed a headache.  She went to sleep to rest.  The following day she slept throughout the day, that evening her mother went to wake her and was unable to wake her up, so she called 911.     She has been doing well since her recent hospital discharge, taking all the meds as prescribed, no new meds.  Planned follow up with GI on 11/14.  LE edema had been improving.  No cough, sob no focal symptoms.  No known sick contacts.     In ED, febrile, tachycardic, lethargic, desaturating to 70s.   Intubated by ED physician.    Had been admitted earlier in October and started on course of prenisone for alc hepatitis.    Recently seen at [facility] 10/19: LE Edema. Lasix not helpful at home.  Admitted for diuresis.  Jaundice and abd distension also noted.  Pleural effusion.  GI saw: non enough ascites for tap  Started lasix and spironolactone.  28 day course of prednisolone, miralax, lactulose, miralax. Midodrine TID    In ED got cefepime, LR 1L, flagyl ordered, intubated, started on Propofol, vanc ordered   Pertinent  Medical History  ETOH Cirrhosis BLE edema recently worsening  S/p lap appy 2014    Echo: recent normal 96/2952   Home meds: folic ascid, acetaminohpen, lasix 40bid, lactulose 30 TID protonix, prednisone (04/29/22), prednisolone 04/28/22 three week course, thiamine, simethicone,    Per her mother she had been drinking at least one bottle of wine daily, she isnt clear exactly how much she was drinking but she did also drink [place].  She notes she had wanted to stop recently but  been unable to.  She thinks she was depressed after experiencing perimenopause.  No tobacco.   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 11/18 more awake, following commands 1121 RUE PICC 11/22 extubated to BiPAP 11/23 - 14d of Rx complerted for MSSA/MESSE. For listeria - ID wants to extend amp/gent to 3 weeks 11/8-11/29. Ileus post extubation and NG to LIS . [place] increased. BP sofft an dlevophed and alb Rx.   11/24 -  OG placed yesterday for ileus and bile returns. TF on hold. Lactulose increased yesterday. REamins off vent. Did not need BiPAP. This AM more obtunded but mom thinks was awake at night and is sleeping due to fatigue. Afebrile.  But wBC up 13L. Neg baklance -3L:  OFF PRESSORs = [place] up at 72 - [place] mild periphepatic ascites Blood culture  11/25 - Now intubated due to obtunded encephaopathy. CTAbc with SBO v Ileus with transition ponit . CCS consult - no surgery but ok for rectal lactulose w ith continue NG/OG tube to LIS. ID ordered repeat blood culture. Linezolid added.  No ntrition since 06/14/22 Pm. Creat worsening. LEss Ur OP. On levophed 79mcg. Not on sedation. Vent 40% Ammonia 108 Trach aspirate 11/27 - Remains intubated. On propofol 70mcg and levophed 30mcg. Febrile up to 101.19F overnight. On ampicillin and linezolid, going for IR guided LP today. Kidney function  improving, repleting potassium. No seizure activity noted overnight on EEG. Surgery following for ileus, no need for surgical intervention. Holding TF for today.  11/29 - started on pulse dose steroids, getting autoimmune workup, CSF studies Interim History / Subjective:   Neuro exam with marginal improvement. Afebrile overnight. Leukocytosis relatively stable. On PSV with PS 16, PEEP 5. Kidney function slightly worsened with worsening uremia, although mental status stable/marginally improved. Still requiring levophed to maintain MAPs at goal.  Given minimal improvement from a respiratory standpoint, it does appear that she will likely need a trach placement this coming week.    Objective   Blood pressure (!) 119/56, pulse 100, temperature 99.9 F (37.7 C), temperature source Oral, resp. rate (!) 34, weight 66.9 kg, SpO2 91 %.    Vent Mode: PSV;CPAP FiO2 (%):  [40 %] 40 % Set Rate:  [20 bmp] 20 bmp Vt Set:  [430 mL] 430 mL PEEP:  [5 cmH20] 5 cmH20 Pressure Support:  [16 cmH20-18 cmH20] 16 cmH20 Plateau Pressure:  [17 cmH20-20 cmH20] 17 cmH20   Intake/Output Summary (Last 24 hours) at 06/23/2022 1344 Last data filed at 06/23/2022 1200 Gross per 24 hour  Intake 3737.26 ml  Output 1450 ml  Net 2287.26 ml   Filed Weights   06/20/22 0500 06/21/22 0100 06/22/22 0500  Weight: 64.1 kg 66.7 kg 66.9 kg   General: critically ill-appearing middle aged female, laying in bed, on MV, NAD. HENT: [place]/AT, ETT and G tube in place. Eyes: opens spontaneously, PERRL, EOMI. Scleral icterus present. CV: tachycardic rate and regular rhythm, no murmurs. Pulm: ventilated breath sounds bilaterally. On PS 16, PEEP 5. GI: soft, mildly distended, normoactive bowel sounds. Skin: warm and dry. Extensive bruising remains on bilateral UE. Neuro: eyes open spontaneously but unable to orient to either side today, no obvious tracking. She does blink to threat. No spontaneous movement of any extremity, but does  slightly grimace to noxious stimuli of proximal extremities.    Resolved Hospital Problem list   Hypokalemia Hypophosphatemia Hypernatremia Thrombocytopenia Ileus  Assessment & Plan:   Shock, circulatory versus septic -weaning levophed as tolerated -continue midodrine 10mg  q8h per tube -merrem, day 6/7 -pulse dose steroids (day 4/5) -holding lasix and aldactone given pressor need  Toxic metabolic encephalopathy Cirrhosis [MELD-Na 17, Child Pugh Class C] New GTC seizure (11/26) Persistent encephalopathy of unclear etiology, although slowly improving. Has been off sedation for multiple days. CSF IgG and albumin are elevated, suggestive of autoimmune component. On pulse dose steroids, day 4/5. Per neurology, would likely need to continue steroids 60mg  daily for at least a month given unclear underlying diagnosis. CSF Oligoclonal band testing and cytology pending. Negative infectious testing.  -appreciate neurology and ID assistance -continue merrem per ID, day 6/7 -lactulose and rifaximin per tube BID -continue keppra -pulse dose steroids, day 4/5 -likely will need steroids 60mg  daily for at least a month after completion of pulse dose steroids -f/u CSF oligoclonal bands and cytology -f/u remaining  autoimmune workup  Acute hypoxic respiratory failure  VAP Klebsiella oxytoca on resp culture Listeria bacteremia and meningitis - completed therapy MSSA bacteremia - completed therapy -extubated 11/22, reintubated 11/24 -on PS 16, PEEP 5 -does not yet meet criteria for SBT/extubation given shock, AKI, and encephalopathy -Remains on merrem for empiric coverage (day 6/7) -CSF and autoimmune workup as outlined above -appreciate ID recommendations -has been on MV for over 2 weeks, will likely need trach placement this coming week -will need TEE once extubated  AKI (Baseline 0.6- 0.9)  Kidney function continuing to gradually worsen with worsening uremia, although mental status  stable/marginally improved. May need to re-consult nephrology if no improvement. -MAP goal >65 to maintain adequate renal perfusion -avoid nephrotoxic medications as able -monitor UOP -trend kidney function -D5W @75cc /hr  Hyperglycemia, uncontrolled Patient currently receiving pulse dose steroids and D5W maintenance. TF have been advanced to goal. CBGs remained elevated overnight in the 200s, required about 71u insulin total yesterday. Will add basal coverage for better glycemic control, but will need to readjust once she finishes pulse dose steroids and is off D5W. -resistant SSI -TF coverage with novolog 6u q4h -added levemir 10u BID -goal BG 140-180  Hypernatremia - improving Sodium improved from 153 > 147. -free water 200cc q4h -D5W @75cc /hr, can likely d/c tomorrow -trend sodium  Severe physical deconditioning Protein caloric malnutrition, mild -TF at goal - She will need aggressive PT/OT (likely CIR) for strength training.  Prolonged QTc interval  Hx of SVT -576msec 06/04/22; 513 msec 06/17/22; 514 msec 06/22/22 - monitor - avoid QTc prolonging medications  Anemia - chronic (baseline 8gm-9gm% in oct 2023) Hgb stable. No active bleeding noted. -trend hgb curve, transfuse if hgb <7 and/or hemodynamic instability  Best Practice (right click and "Reselect all SmartList Selections" daily)   Diet/type: tubefeeds  DVT prophylaxis: prophylactic heparin  GI prophylaxis: PPI Lines: Central line - RUE PICC since 06/12/22 Foley:  Yes, and it is still needed Code Status:  full code Last date of multidisciplinary goals of care discussion [updated mother at bedside 06/23/2022]   Virl Axe, MD 06/23/2022, 1:44 PM    LABS   CBC Recent Labs  Lab 06/21/22 0409 06/22/22 0304 06/23/22 0259  HGB 8.2* 8.5* 8.4*  HCT 25.6* 26.2* 25.8*  WBC 14.0* 22.1* 24.2*  PLT 187 158 127*    CHEMISTRY Recent Labs  Lab 06/17/22 0253 06/18/22 0338 06/19/22 0402 06/19/22 2025  06/20/22 0352 06/21/22 0409 06/22/22 0304 06/22/22 1249 06/23/22 0259 06/23/22 1247  NA 141   < > 142  --  143 150* 153* 149* 147*  --   K 5.3*   < > 3.1*   < > 3.9 3.5 3.0* 3.2* 3.0* 3.4*  CL 94*   < > 102  --  109 111 115* 111 115*  --   CO2 26   < > 23  --  21* 21* 21* 19* 19*  --   GLUCOSE 129*   < > 147*  --  132* 210* 248* 410* 249*  --   BUN 74*   < > 59*  --  71* 82* 100* 99* 113*  --   CREATININE 2.61*   < > 1.83*  --  1.83* 1.99* 2.22* 2.27* 2.42*  --   CALCIUM 8.3*   < > 7.3*  --  7.5* 8.3* 8.9 9.0 8.7*  --   MG 2.8*  --  2.6*  --   --   --   --   --   --  2.6*  PHOS 8.1*  --   --   --   --   --   --   --   --   --    < > = values in this interval not displayed.   Estimated Creatinine Clearance: 25.6 mL/min (A) (by C-G formula based on SCr of 2.42 mg/dL (H)).   LIVER Recent Labs  Lab 06/19/22 0402 06/20/22 0352 06/20/22 1141 06/20/22 1355 06/21/22 0409 06/22/22 0304 06/23/22 0259  AST 42* 40  --   --  47* 67* 80*  ALT 20 22  --   --  20 21 23   ALKPHOS 71 87  --   --  81 89 105  BILITOT 4.5* 4.9* 4.7*  --  4.7* 4.0* 3.4*  PROT 6.8 6.4*  --   --  6.3* 6.2* 6.4*  ALBUMIN 3.3* 2.9*  --  3.1* 2.8* 2.7* 2.6*    ENDOCRINE CBG (last 3)  Recent Labs    06/23/22 0302 06/23/22 0748 06/23/22 1138  GLUCAP 246* 217* 236*

## 2022-06-23 NOTE — Progress Notes (Signed)
eLink Physician-Brief Progress Note Patient Name: Deanna Mcmillan DOB: Nov 22, 1970 MRN: [id number]   Date of Service  06/23/2022  HPI/Events of Note  Notified of potassium 3.0, creatinine 2.42, GFR 23. pt has cortrak and PICC   eICU Interventions  Ordered potassium 40 meqs per feeding tube     Intervention Category Intermediate Interventions: Electrolyte abnormality - evaluation and management  Judd Lien 06/23/2022, 4:37 AM

## 2022-06-23 NOTE — Progress Notes (Signed)
Neurology Progress Note  Major interval events/Subjective: - Fever curve improving, Tmax 100.6  - Level of consciousness stable per mom   Current Facility-Administered Medications:    0.9 %  sodium chloride infusion, , Intravenous, PRN, Julian Hy, DO, Last Rate: 10 mL/hr at 06/22/22 0400, New Bag at 06/22/22 0400   acetaminophen (TYLENOL) 160 MG/5ML solution 650 mg, 650 mg, Per Tube, Q6H PRN, Judd Lien, MD, 650 mg at 06/21/22 2015   Chlorhexidine Gluconate Cloth 2 % PADS 6 each, 6 each, Topical, Daily, Candee Furbish, MD, 6 each at 06/22/22 2200   dextrose 5 % solution, , Intravenous, Continuous, Virl Axe, MD, Last Rate: 75 mL/hr at 06/23/22 0800, Infusion Verify at 06/23/22 0800   feeding supplement (PROSource TF20) liquid 60 mL, 60 mL, Per Tube, Daily, Meuth, Brooke A, PA-C, 60 mL at 06/22/22 0954   feeding supplement (VITAL 1.5 CAL) liquid 1,000 mL, 1,000 mL, Per Tube, Continuous, Meuth, Brooke A, PA-C, Last Rate: 55 mL/hr at 06/23/22 0800, Infusion Verify at 06/23/22 0800   fentaNYL (SUBLIMAZE) injection 50-200 mcg, 50-200 mcg, Intravenous, Q30 min PRN, Chesley Mires, MD, 50 mcg at 58/09/98 3382   folic acid (FOLVITE) tablet 1 mg, 1 mg, Per Tube, Daily, Ventura Sellers, RPH, 1 mg at 06/22/22 5053   free water 200 mL, 200 mL, Per Tube, Q4H, Virl Axe, MD, 200 mL at 06/23/22 0802   heparin injection 5,000 Units, 5,000 Units, Subcutaneous, Q8H, Corey Harold, NP, 5,000 Units at 06/23/22 0501   insulin aspart (novoLOG) injection 0-20 Units, 0-20 Units, Subcutaneous, Q4H, Virl Axe, MD, 7 Units at 06/23/22 0802   insulin aspart (novoLOG) injection 6 Units, 6 Units, Subcutaneous, Q4H, Rigoberto Noel, MD, 6 Units at 06/23/22 0802   lactated ringers infusion, , Intravenous, Continuous, Virl Axe, MD, Stopped at 06/22/22 1239   lactulose (CHRONULAC) 10 GM/15ML solution 20 g, 20 g, Per Tube, TID, Virl Axe, MD, 20 g at 06/22/22 2133   leptospermum manuka  honey (MEDIHONEY) paste 1 Application, 1 Application, Topical, Daily, Brand Males, MD, 1 Application at 97/67/34 1006   levETIRAcetam (KEPPRA) tablet 500 mg, 500 mg, Per Tube, BID, Ventura Sellers, RPH, 500 mg at 06/22/22 2142   lip balm (CARMEX) ointment, , Topical, PRN, Virl Axe, MD   meropenem (MERREM) 1 g in sodium chloride 0.9 % 100 mL IVPB, 1 g, Intravenous, Q12H, Sinclair, Emily S, RPH, Stopped at 06/22/22 2207   [COMPLETED] methylPREDNISolone sodium succinate (SOLU-MEDROL) 1,000 mg in sodium chloride 0.9 % 50 mL IVPB, 1,000 mg, Intravenous, Once, Stopped at 06/20/22 2331 **FOLLOWED BY** [COMPLETED] methylPREDNISolone sodium succinate (SOLU-MEDROL) 1,000 mg in sodium chloride 0.9 % 50 mL IVPB, 1,000 mg, Intravenous, Once, Stopped at 06/21/22 1909 **FOLLOWED BY** [COMPLETED] methylPREDNISolone sodium succinate (SOLU-MEDROL) 1,000 mg in sodium chloride 0.9 % 50 mL IVPB, 1,000 mg, Intravenous, Once, Last Rate: 66 mL/hr at 06/22/22 1300, Infusion Verify at 06/22/22 1300 **FOLLOWED BY** methylPREDNISolone sodium succinate (SOLU-MEDROL) 1,000 mg in sodium chloride 0.9 % 50 mL IVPB, 1,000 mg, Intravenous, Once, Last Rate: 66 mL/hr at 06/23/22 0808, 1,000 mg at 06/23/22 0808 **FOLLOWED BY** [START ON 06/24/2022] methylPREDNISolone sodium succinate (SOLU-MEDROL) 1,000 mg in sodium chloride 0.9 % 50 mL IVPB, 1,000 mg, Intravenous, Once, Jaeli Grubb L, MD   midodrine (PROAMATINE) tablet 10 mg, 10 mg, Per Tube, Q8H, Pierce, Dwayne A, RPH, 10 mg at 06/23/22 0501   norepinephrine (LEVOPHED) 16 mg in 263mL (0.064 mg/mL) premix infusion, 0-40 mcg/min, Intravenous, Titrated, Mannam, Praveen,  MD, Last Rate: 3.75 mL/hr at 06/23/22 0800, 4 mcg/min at 06/23/22 0800   Oral care mouth rinse, 15 mL, Mouth Rinse, Q2H, Sood, Vineet, MD, 15 mL at 06/23/22 0802   Oral care mouth rinse, 15 mL, Mouth Rinse, PRN, Chesley Mires, MD   pantoprazole (PROTONIX) injection 40 mg, 40 mg, Intravenous, Q24H, Chesley Mires,  MD, 40 mg at 06/22/22 2135   rifaximin (XIFAXAN) tablet 550 mg, 550 mg, Per Tube, BID, Brand Males, MD, 550 mg at 06/22/22 2135   sodium chloride flush (NS) 0.9 % injection 10-40 mL, 10-40 mL, Intracatheter, Q12H, Julian Hy, DO, 10 mL at 06/22/22 2136   sodium chloride flush (NS) 0.9 % injection 10-40 mL, 10-40 mL, Intracatheter, PRN, Julian Hy, DO   thiamine (VITAMIN B1) tablet 100 mg, 100 mg, Per Tube, Daily, Ventura Sellers, RPH, 100 mg at 06/22/22 0347   white petrolatum (VASELINE) gel, , Topical, PRN, Julian Hy, DO, 1 Application at 42/59/56 0809  Current vital signs: BP (!) 111/51 (BP Location: Left Arm)   Pulse 87   Temp 99.9 F (37.7 C) (Oral)   Resp (!) 27   Wt 66.9 kg   SpO2 92%   BMI 25.32 kg/m  Vital signs in last 24 hours: Temp:  [99.5 F (37.5 C)-100.6 F (38.1 C)] 99.9 F (37.7 C) (12/02 0700) Pulse Rate:  [78-95] 87 (12/02 0800) Resp:  [17-42] 27 (12/02 0800) BP: (94-122)/(43-81) 111/51 (12/02 0800) SpO2:  [92 %-99 %] 92 % (12/02 0800) FiO2 (%):  [40 %] 40 % (12/02 0800)   Physical Exam  Constitutional: Appears chronically ill  Psych: Minimally interactive Eyes: Scleral edema is minimal HENT: ET tube in place MSK: no joint deformities.  Cardiovascular: Mildly tachy Respiratory: Breathing comfortably on the ventilator,  GI: Mildly distended Skin: Scattered bruising throughout, some icterus   Neuro: Mental Status: Opens eyes to voice today, not clearly orienting to examiner, not following commands  Cranial Nerves: Blinks to threat bilaterally.  Pupils are equal, round, and reactive to light.   VOR better to the left today than the right  Intact cough Spontaneously keeps tongue protruded, grossly midline within limits of ET tube Motor/Sensory: No spontaneous movement today. Slightly wiggles two of her left toes after tickle but no clear peripheral response to noxious stim, does grimace on noxious stim to all four extremities at  times slightly moves her head  Cerebellar: Unable to assess secondary to patient's mental status    Pertinent Labs:  Basic Metabolic Panel: Recent Labs  Lab 06/17/22 0253 06/18/22 0338 06/19/22 0402 06/19/22 2025 06/20/22 0352 06/21/22 0409 06/22/22 0304 06/22/22 1249 06/23/22 0259  NA 141   < > 142  --  143 150* 153* 149* 147*  K 5.3*   < > 3.1*   < > 3.9 3.5 3.0* 3.2* 3.0*  CL 94*   < > 102  --  109 111 115* 111 115*  CO2 26   < > 23  --  21* 21* 21* 19* 19*  GLUCOSE 129*   < > 147*  --  132* 210* 248* 410* 249*  BUN 74*   < > 59*  --  71* 82* 100* 99* 113*  CREATININE 2.61*   < > 1.83*  --  1.83* 1.99* 2.22* 2.27* 2.42*  CALCIUM 8.3*   < > 7.3*  --  7.5* 8.3* 8.9 9.0 8.7*  MG 2.8*  --  2.6*  --   --   --   --   --   --  PHOS 8.1*  --   --   --   --   --   --   --   --    < > = values in this interval not displayed.     CBC: Recent Labs  Lab 06/19/22 0402 06/20/22 0352 06/21/22 0409 06/22/22 0304 06/23/22 0259  WBC 15.0* 16.6* 14.0* 22.1* 24.2*  NEUTROABS 12.0* 13.2* 12.3* 18.5*  --   HGB 8.8* 8.4* 8.2* 8.5* 8.4*  HCT 27.8* 26.5* 25.6* 26.2* 25.8*  MCV 115.4* 116.2* 114.8* 113.9* 113.7*  PLT 242 224 187 158 127*     Coagulation Studies: No results for input(s): "LABPROT", "INR" in the last 72 hours.   CSF Opening pressure 11/27 25, closing pressure 19 Opening pressure 11/29 23, closing pressure 12  Latest Reference Range & Units 06/01/22 13:43 06/18/22 11:04  Appearance, CSF CLEAR  CLOUDY ! HAZY !  Glucose, CSF 40 - 70 mg/dL 86 (H) 94 (H) -- serum 193 48% of serum  RBC Count, CSF 0 /cu mm 26,400 (H) 5,000 (H)  WBC, CSF 0 - 5 /cu mm 80 (HH) 191 (HH)  Segmented Neutrophils-CSF 0 - 6 % 37 (H) 3  Lymphs, CSF 40 - 80 % 53 92 (H)  Monocyte-Macrophage-Spinal Fluid 15 - 45 % 9 (L) 5 (L)  Eosinophils, CSF 0 - 1 % 1 0  Color, CSF COLORLESS  RED ! AMBER !  Supernatant  XANTHOCHROMIC XANTHOCHROMIC  Total  Protein, CSF 15 - 45 mg/dL >600 (H) >600 (H)  (HH):  Data is critically high; !: Data is abnormal; (H): Data is abnormally high; (L): Data is abnormally low   CSF 11/30: RBC 5725/1800, WBC 61/88 (lymphocytic predominance), xanthochromic with protein of 423 and glucose of 92 (serum 141) Elevated IgG index 39.2 supports CNS inflammatory process VDRL negative CSF flow and cytopathology were not collected/processed   Autoimmune labs:  Thyroperoxidase Ab SerPl-aCnc 0 - 34 IU/mL 22  Thyroglobulin Antibody 0.0 - 0.9 IU/mL   Anticardiolipin IgM 25, IgG and IgA neg RF neg ANCA neg Anti smooth muscle IgG and mitochondrial antibodies neg ANA neg Anti Jo-1 neg Ribonucleic Protein 0.0 - 0.9 AI 0.3 VC  SSA (Ro) (ENA) Antibody, IgG 0.0 - 0.9 AI <0.2  Scleroderma (Scl-70) (ENA) Antibody, IgG 0.0 - 0.9 AI <0.2  ENA SM Ab Ser-aCnc 0.0 - 0.9 AI <0.2  SSB (La) (ENA) Antibody, IgG 0.0 - 0.9 AI <0.2  ds DNA Ab 0 - 9 IU/mL <1    Additional serum labs TSH: 0.095 (low) fT4 0.72 (wnl) HIV neg  MRI brain w/o contrast 06/17/2022 personally reviewed, agree with radiology:   No acute intracranial pathology or epileptogenic focus identified.   MRI brain and C-spine 06/20/2022 1. Negative for acute infarct or mass. 2. Diffuse pachymeningeal enhancement and mild thickening. Sulcal hyperintensity on FLAIR has progressed in the interval. Mild pituitary enlargement for age unchanged. The mamillopontine distance is narrowed at 4.3 mm. Findings can be seen with intracranial hypotension however some of these findings can also be seen with meningitis. 3. No evidence of spinal infection in the cervical spine. Cervical spondylosis causing foraminal narrowing as described above.  CT abdomen pelvis w/o contrast 06/15/22 Multiple dilated loops of small bowel with a transition zone in the mid to distal ileum consistent with a least partial small bowel obstruction. Bibasilar consolidation with left-sided pleural effusions stable from the prior CT. Cirrhotic changes  of the liver with ascites. Ascites may also be in part due to the underlying  obstructive change.    CT chest 11/30 a.m. formal read pending but on personal review with CCM team there is some concern for pneumonia   Assessment: With initiation of steroids, patient is slightly improved, though stable exam from yesterday.  Extended discussion with mom at bedside yesterday about the guarded prognosis, with high risk of worsening infection due to immunosuppression with pulse dose steroids and current concern for pneumonia, possibly ventilator associated. No family was at bedside today.  Other considerations for infectious causes resulting in the patient's CSF findings other than Listeria would be HSV/VZV versus TB.  At this time low concern for TB based on CT chest (in discussion with CCM team), as she has had negative HSV/VZV PCR but I have added on IgG for confirmation.  If she initially improves with steroids but then worsens and VZV IgG has not resulted may consider acyclovir or would consider it if infectious disease feels it is prudent to start at this time.  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 autoimmune -Follow-up pending repeat CSF studies from 11/29 oligoclonal bands, bacterial culture, fungal culture, anaerobic culture, autoimmune CSF encephalitis panel, VZV IgG CSF -Continue pulse dose steroids 1 g daily for 5 days, keep 20 hours given first dose was at 2230 on 11/29, complete dosing on 12/3, pending this would continue 60 daily for at least a month given unclear underlying diagnosis, pending further evaluation  -After 1 month if high dose steroids are continued, consider PJP prophylaxsis -Follow-up serum autoimmune studies: oligoclonal bands, autoimmune serum encephalitis panel  # Clinical concern for seizure, seizure free on Keppra - LTM EEG 11/26 - 11/29 negative for seizures, after keppra initiated  - s/p Keppra 3500 mg load, then 1000 mg BID; given renal function and somnolence reduced to 500 mg BID on 11/27, will continue to adjust as needed given fluctuating renal function, dose ranges for reference: Estimated Creatinine Clearance: 25.6 mL/min (A) (by C-G formula based on SCr of 2.42 mg/dL (H)).   CrCl 80 to 130 mL/minute/1.73 m2: 500 mg to 1.5 g every 12 hours.  CrCl 50 to <80 mL/minute/1.73 m2: 500 mg to 1 g every 12 hours.  CrCl 30 to <50 mL/minute/1.73 m2: 250 to 750 mg every 12 hours.  CrCl 15 to <30 mL/minute/1.73 m2: 250 to 500 mg every 12 hours.  CrCl <15 mL/minute/1.73 m2: 250 to 500 mg every 24 hours (expert opinion).  CRITICAL CARE Performed by: Lorenza Chick If 7pm- 7am, please page neurology on call  as  listed in [place].  Total critical care time: 30 minutes  Critical care time was exclusive of separately billable procedures and treating other patients.  Critical care was necessary to treat or prevent imminent or life-threatening deterioration.  Critical care was time spent personally by me on the following activities: development of treatment plan with patient and/or surrogate as well as nursing, discussions with consultants, evaluation of patient's response to treatment, examination of patient, obtaining history from patient or surrogate, ordering and performing treatments and interventions, ordering and review of laboratory studies, ordering and review of radiographic studies, pulse oximetry and re-evaluation of patient's condition.

## 2022-06-23 NOTE — Plan of Care (Signed)
  Problem: Education: Goal: Knowledge of General Education information will improve Description: Including pain rating scale, medication(s)/side effects and non-pharmacologic comfort measures Outcome: Not Progressing   Problem: Health Behavior/Discharge Planning: Goal: Ability to manage health-related needs will improve Outcome: Not Progressing   Problem: Clinical Measurements: Goal: Ability to maintain clinical measurements within normal limits will improve Outcome: Not Progressing Goal: Will remain free from infection Outcome: Not Progressing Goal: Diagnostic test results will improve Outcome: Not Progressing Goal: Respiratory complications will improve Outcome: Not Progressing Goal: Cardiovascular complication will be avoided Outcome: Not Progressing   Problem: Activity: Goal: Risk for activity intolerance will decrease Outcome: Not Progressing   Problem: Nutrition: Goal: Adequate nutrition will be maintained Outcome: Not Progressing   Problem: Coping: Goal: Level of anxiety will decrease Outcome: Not Progressing   Problem: Elimination: Goal: Will not experience complications related to bowel motility Outcome: Not Progressing Goal: Will not experience complications related to urinary retention Outcome: Not Progressing   Problem: Pain Managment: Goal: General experience of comfort will improve Outcome: Not Progressing   Problem: Safety: Goal: Ability to remain free from injury will improve Outcome: Not Progressing   Problem: Skin Integrity: Goal: Risk for impaired skin integrity will decrease Outcome: Not Progressing   Problem: Education: Goal: Ability to describe self-care measures that may prevent or decrease complications (Diabetes Survival Skills Education) will improve Outcome: Not Progressing Goal: Individualized Educational Video(s) Outcome: Not Progressing   Problem: Coping: Goal: Ability to adjust to condition or change in health will  improve Outcome: Not Progressing   Problem: Fluid Volume: Goal: Ability to maintain a balanced intake and output will improve Outcome: Not Progressing   Problem: Health Behavior/Discharge Planning: Goal: Ability to identify and utilize available resources and services will improve Outcome: Not Progressing Goal: Ability to manage health-related needs will improve Outcome: Not Progressing   Problem: Metabolic: Goal: Ability to maintain appropriate glucose levels will improve Outcome: Not Progressing   Problem: Nutritional: Goal: Maintenance of adequate nutrition will improve Outcome: Not Progressing Goal: Progress toward achieving an optimal weight will improve Outcome: Not Progressing   Problem: Skin Integrity: Goal: Risk for impaired skin integrity will decrease Outcome: Not Progressing   Problem: Tissue Perfusion: Goal: Adequacy of tissue perfusion will improve Outcome: Not Progressing   Problem: Activity: Goal: Ability to tolerate increased activity will improve Outcome: Not Progressing   Problem: Respiratory: Goal: Ability to maintain a clear airway and adequate ventilation will improve Outcome: Not Progressing   Problem: Role Relationship: Goal: Method of communication will improve Outcome: Not Progressing   

## 2022-06-24 ENCOUNTER — Inpatient Hospital Stay (HOSPITAL_COMMUNITY): Payer: 59

## 2022-06-24 DIAGNOSIS — G928 Other toxic encephalopathy: Secondary | ICD-10-CM | POA: Diagnosis not present

## 2022-06-24 DIAGNOSIS — N179 Acute kidney failure, unspecified: Secondary | ICD-10-CM | POA: Diagnosis not present

## 2022-06-24 DIAGNOSIS — J9601 Acute respiratory failure with hypoxia: Secondary | ICD-10-CM | POA: Diagnosis not present

## 2022-06-24 DIAGNOSIS — G9340 Encephalopathy, unspecified: Secondary | ICD-10-CM | POA: Diagnosis not present

## 2022-06-24 LAB — CBC
HCT: 25 % — ABNORMAL LOW (ref 36.0–46.0)
Hemoglobin: 8.3 g/dL — ABNORMAL LOW (ref 12.0–15.0)
MCH: 37.2 pg — ABNORMAL HIGH (ref 26.0–34.0)
MCHC: 33.2 g/dL (ref 30.0–36.0)
MCV: 112.1 fL — ABNORMAL HIGH (ref 80.0–100.0)
Platelets: 110 10*3/uL — ABNORMAL LOW (ref 150–400)
RBC: 2.23 MIL/uL — ABNORMAL LOW (ref 3.87–5.11)
RDW: 16 % — ABNORMAL HIGH (ref 11.5–15.5)
WBC: 23.6 10*3/uL — ABNORMAL HIGH (ref 4.0–10.5)
nRBC: 6.3 % — ABNORMAL HIGH (ref 0.0–0.2)

## 2022-06-24 LAB — COMPREHENSIVE METABOLIC PANEL
ALT: 26 U/L (ref 0–44)
AST: 97 U/L — ABNORMAL HIGH (ref 15–41)
Albumin: 2.6 g/dL — ABNORMAL LOW (ref 3.5–5.0)
Alkaline Phosphatase: 120 U/L (ref 38–126)
Anion gap: 18 — ABNORMAL HIGH (ref 5–15)
BUN: 131 mg/dL — ABNORMAL HIGH (ref 6–20)
CO2: 16 mmol/L — ABNORMAL LOW (ref 22–32)
Calcium: 8.9 mg/dL (ref 8.9–10.3)
Chloride: 108 mmol/L (ref 98–111)
Creatinine, Ser: 2.7 mg/dL — ABNORMAL HIGH (ref 0.44–1.00)
GFR, Estimated: 21 mL/min — ABNORMAL LOW (ref >60)
Glucose, Bld: 278 mg/dL — ABNORMAL HIGH (ref 70–99)
Potassium: 3.6 mmol/L (ref 3.5–5.1)
Sodium: 142 mmol/L (ref 135–145)
Total Bilirubin: 3.4 mg/dL — ABNORMAL HIGH (ref 0.3–1.2)
Total Protein: 6.1 g/dL — ABNORMAL LOW (ref 6.5–8.1)

## 2022-06-24 LAB — GLUCOSE, CAPILLARY
Glucose-Capillary: 258 mg/dL — ABNORMAL HIGH (ref 70–99)
Glucose-Capillary: 263 mg/dL — ABNORMAL HIGH (ref 70–99)
Glucose-Capillary: 264 mg/dL — ABNORMAL HIGH (ref 70–99)
Glucose-Capillary: 251 mg/dL — ABNORMAL HIGH (ref 70–99)
Glucose-Capillary: 227 mg/dL — ABNORMAL HIGH (ref 70–99)
Glucose-Capillary: 224 mg/dL — ABNORMAL HIGH (ref 70–99)

## 2022-06-24 LAB — AMMONIA: Ammonia: 37 umol/L — ABNORMAL HIGH (ref 9–35)

## 2022-06-24 LAB — PHOSPHORUS: Phosphorus: 2.8 mg/dL (ref 2.5–4.6)

## 2022-06-24 LAB — MAGNESIUM: Magnesium: 2.7 mg/dL — ABNORMAL HIGH (ref 1.7–2.4)

## 2022-06-24 MED ORDER — LACTULOSE 10 GM/15ML PO SOLN
20.0000 g | Freq: Every day | ORAL | Status: DC
  Administered 2022-06-25: 20 g
  Filled 2022-06-24: qty 30

## 2022-06-24 MED ORDER — INSULIN DETEMIR 100 UNIT/ML ~~LOC~~ SOLN
10.0000 [IU] | Freq: Every day | SUBCUTANEOUS | Status: DC
  Administered 2022-06-25 – 2022-06-26 (×2): 10 [IU] via SUBCUTANEOUS
  Filled 2022-06-24 (×2): qty 0.1

## 2022-06-24 MED ORDER — MODAFINIL 100 MG PO TABS
100.0000 mg | ORAL_TABLET | Freq: Every day | ORAL | Status: DC
  Administered 2022-06-24 – 2022-06-28 (×4): 100 mg
  Filled 2022-06-24 (×4): qty 1

## 2022-06-24 MED ORDER — METHYLPREDNISOLONE SODIUM SUCC 125 MG IJ SOLR: 60.0000 mg | INTRAMUSCULAR | Status: DC

## 2022-06-24 MED ORDER — STERILE WATER FOR INJECTION IV SOLN
INTRAVENOUS | Status: DC
  Filled 2022-06-24: qty 1000
  Filled 2022-06-24: qty 150
  Filled 2022-06-24: qty 1000

## 2022-06-24 NOTE — Progress Notes (Signed)
Neurology Progress Note  Major interval events/Subjective: - Continues with low grade fevers  - Level of consciousness stable per nursing at bedside with no significant fluctuation - No c/f seizure activity   Current Facility-Administered Medications:    0.9 %  sodium chloride infusion, , Intravenous, PRN, Julian Hy, DO, Last Rate: 10 mL/hr at 06/22/22 0400, New Bag at 06/22/22 0400   acetaminophen (TYLENOL) 160 MG/5ML solution 650 mg, 650 mg, Per Tube, Q6H PRN, Judd Lien, MD, 650 mg at 06/21/22 2015   Chlorhexidine Gluconate Cloth 2 % PADS 6 each, 6 each, Topical, Daily, Candee Furbish, MD, 6 each at 06/23/22 2015   feeding supplement (PROSource TF20) liquid 60 mL, 60 mL, Per Tube, Daily, Meuth, Brooke A, PA-C, 60 mL at 06/24/22 0929   feeding supplement (VITAL 1.5 CAL) liquid 1,000 mL, 1,000 mL, Per Tube, Continuous, Meuth, Brooke A, PA-C, Last Rate: 55 mL/hr at 06/24/22 1000, Infusion Verify at 06/24/22 1000   fentaNYL (SUBLIMAZE) injection 50-200 mcg, 50-200 mcg, Intravenous, Q30 min PRN, Chesley Mires, MD, 50 mcg at 70/35/00 9381   folic acid (FOLVITE) tablet 1 mg, 1 mg, Per Tube, Daily, Ventura Sellers, RPH, 1 mg at 06/24/22 0930   free water 200 mL, 200 mL, Per Tube, Q4H, Virl Axe, MD, 200 mL at 06/24/22 1138   heparin injection 5,000 Units, 5,000 Units, Subcutaneous, Q8H, Corey Harold, NP, 5,000 Units at 06/24/22 0557   insulin aspart (novoLOG) injection 0-20 Units, 0-20 Units, Subcutaneous, Q4H, Virl Axe, MD, 11 Units at 06/24/22 1139   insulin aspart (novoLOG) injection 6 Units, 6 Units, Subcutaneous, Q4H, Kara Mead V, MD, 6 Units at 06/24/22 1139   [START ON 06/25/2022] insulin detemir (LEVEMIR) injection 10 Units, 10 Units, Subcutaneous, Daily, Rigoberto Noel, MD   lactated ringers infusion, , Intravenous, Continuous, Virl Axe, MD, Stopped at 06/22/22 1239   [START ON 06/25/2022] lactulose (CHRONULAC) 10 GM/15ML solution 20 g, 20 g, Per Tube,  Daily, Rigoberto Noel, MD   leptospermum manuka honey (Watkins) paste 1 Application, 1 Application, Topical, QHS, Rigoberto Noel, MD, 1 Application at 82/99/37 2103   levETIRAcetam (KEPPRA) tablet 500 mg, 500 mg, Per Tube, BID, Ventura Sellers, RPH, 500 mg at 06/24/22 0930   lip balm (CARMEX) ointment, , Topical, PRN, Virl Axe, MD   meropenem (MERREM) 1 g in sodium chloride 0.9 % 100 mL IVPB, 1 g, Intravenous, Q12H, Susa Raring, RPH, Stopped at 06/24/22 0959   [START ON 06/25/2022] methylPREDNISolone sodium succinate (SOLU-MEDROL) 125 mg/2 mL injection 60 mg, 60 mg, Intravenous, Q24H, Rigoberto Noel, MD   midodrine (PROAMATINE) tablet 10 mg, 10 mg, Per Tube, Q8H, Pierce, Dwayne A, RPH, 10 mg at 06/24/22 0557   norepinephrine (LEVOPHED) 16 mg in 249mL (0.064 mg/mL) premix infusion, 0-40 mcg/min, Intravenous, Titrated, Mannam, Praveen, MD, Stopped at 06/24/22 0927   Oral care mouth rinse, 15 mL, Mouth Rinse, Q2H, Sood, Vineet, MD, 15 mL at 06/24/22 1138   Oral care mouth rinse, 15 mL, Mouth Rinse, PRN, Halford Chessman, Vineet, MD   pantoprazole (PROTONIX) injection 40 mg, 40 mg, Intravenous, Q24H, Sood, Vineet, MD, 40 mg at 06/23/22 2140   rifaximin (XIFAXAN) tablet 550 mg, 550 mg, Per Tube, BID, Ramaswamy, Murali, MD, 550 mg at 06/24/22 0930   sodium bicarbonate 150 mEq in sterile water 1,150 mL infusion, , Intravenous, Continuous, Kara Mead V, MD, Last Rate: 50 mL/hr at 06/24/22 1044, New Bag at 06/24/22 1044   sodium chloride flush (  NS) 0.9 % injection 10-40 mL, 10-40 mL, Intracatheter, Q12H, Julian Hy, DO, 10 mL at 06/24/22 0931   sodium chloride flush (NS) 0.9 % injection 10-40 mL, 10-40 mL, Intracatheter, PRN, Noemi Chapel P, DO   thiamine (VITAMIN B1) tablet 100 mg, 100 mg, Per Tube, Daily, Ventura Sellers, RPH, 100 mg at 06/24/22 6387   white petrolatum (VASELINE) gel, , Topical, PRN, Julian Hy, DO, 1 Application at 56/43/32 0809  Current vital signs: BP (!) 112/48    Pulse 89   Temp 100.3 F (37.9 C) (Oral)   Resp (!) 31   Wt 74.4 kg   SpO2 92%   BMI 28.15 kg/m  Vital signs in last 24 hours: Temp:  [98.8 F (37.1 C)-100.8 F (38.2 C)] 100.3 F (37.9 C) (12/03 1126) Pulse Rate:  [80-100] 89 (12/03 1000) Resp:  [23-38] 31 (12/03 1000) BP: (95-127)/(45-78) 112/48 (12/03 1000) SpO2:  [89 %-98 %] 92 % (12/03 1000) FiO2 (%):  [40 %] 40 % (12/03 1204) Weight:  [74.4 kg] 74.4 kg (12/03 0500)   Physical Exam  Constitutional: Appears chronically ill  Psych: Minimally interactive Eyes: Scleral edema is minimal HENT: ET tube in place MSK: no joint deformities.  Cardiovascular: Mildly tachy Respiratory: Breathing comfortably on the ventilator,  GI: Mildly distended Skin: Scattered bruising throughout, some icterus   Neuro: Mental Status: Keeps eyes open once I open them but does not open eyes to voice today, blinks spontaneously and to threat, not clearly orienting to examiner, not following commands  Cranial Nerves: Blinks to threat bilaterally.  Pupils are equal, round, and reactive to light.   VOR equal today Intact cough Motor/Sensory: No spontaneous movement today. Today some slight tremoring of her right foot intermittently after she is awoken. No clear response to noxious stim in any of her extremities Reflexes: 2+ right biceps, brachioradialis. Absent on the left. No patellar reflexes bilaterally. 2+ right ankle jerk, absent on the left. No clonus bilaterally Cerebellar: Unable to assess secondary to patient's mental status    Pertinent Labs:  Basic Metabolic Panel: Recent Labs  Lab 06/19/22 0402 06/19/22 2025 06/21/22 0409 06/22/22 0304 06/22/22 1249 06/23/22 0259 06/23/22 1247 06/24/22 0259  NA 142   < > 150* 153* 149* 147*  --  142  K 3.1*   < > 3.5 3.0* 3.2* 3.0* 3.4* 3.6  CL 102   < > 111 115* 111 115*  --  108  CO2 23   < > 21* 21* 19* 19*  --  16*  GLUCOSE 147*   < > 210* 248* 410* 249*  --  278*  BUN 59*   < >  82* 100* 99* 113*  --  131*  CREATININE 1.83*   < > 1.99* 2.22* 2.27* 2.42*  --  2.70*  CALCIUM 7.3*   < > 8.3* 8.9 9.0 8.7*  --  8.9  MG 2.6*  --   --   --   --   --  2.6* 2.7*  PHOS  --   --   --   --   --   --   --  2.8   < > = values in this interval not displayed.     CBC: Recent Labs  Lab 06/19/22 0402 06/20/22 0352 06/21/22 0409 06/22/22 0304 06/23/22 0259 06/24/22 0259  WBC 15.0* 16.6* 14.0* 22.1* 24.2* 23.6*  NEUTROABS 12.0* 13.2* 12.3* 18.5*  --   --   HGB 8.8* 8.4* 8.2* 8.5* 8.4* 8.3*  HCT 27.8* 26.5* 25.6* 26.2* 25.8* 25.0*  MCV 115.4* 116.2* 114.8* 113.9* 113.7* 112.1*  PLT 242 224 187 158 127* 110*     Coagulation Studies: No results for input(s): "LABPROT", "INR" in the last 72 hours.   CSF Opening pressure 11/27 25, closing pressure 19 Opening pressure 11/29 23, closing pressure 12  Latest Reference Range & Units 06/01/22 13:43 06/18/22 11:04  Appearance, CSF CLEAR  CLOUDY ! HAZY !  Glucose, CSF 40 - 70 mg/dL 86 (H) 94 (H) -- serum 193 48% of serum  RBC Count, CSF 0 /cu mm 26,400 (H) 5,000 (H)  WBC, CSF 0 - 5 /cu mm 80 (HH) 191 (HH)  Segmented Neutrophils-CSF 0 - 6 % 37 (H) 3  Lymphs, CSF 40 - 80 % 53 92 (H)  Monocyte-Macrophage-Spinal Fluid 15 - 45 % 9 (L) 5 (L)  Eosinophils, CSF 0 - 1 % 1 0  Color, CSF COLORLESS  RED ! AMBER !  Supernatant  XANTHOCHROMIC XANTHOCHROMIC  Total  Protein, CSF 15 - 45 mg/dL >600 (H) >600 (H)  (HH): Data is critically high; !: Data is abnormal; (H): Data is abnormally high; (L): Data is abnormally low   CSF 11/30: RBC 5725/1800, WBC 61/88 (lymphocytic predominance), xanthochromic with protein of 423 and glucose of 92 (serum 141) CSF elevated IgG 39.2 but IgG index 0.6 which is normal VDRL negative CSF flow and cytopathology were not collected/processed   Autoimmune labs:  Thyroperoxidase Ab SerPl-aCnc 0 - 34 IU/mL 22  Thyroglobulin Antibody 0.0 - 0.9 IU/mL   Anticardiolipin IgM 25, IgG and IgA neg RF neg ANCA  neg Anti smooth muscle IgG and mitochondrial antibodies neg ANA neg Anti Jo-1 neg Ribonucleic Protein 0.0 - 0.9 AI 0.3 VC  SSA (Ro) (ENA) Antibody, IgG 0.0 - 0.9 AI <0.2  Scleroderma (Scl-70) (ENA) Antibody, IgG 0.0 - 0.9 AI <0.2  ENA SM Ab Ser-aCnc 0.0 - 0.9 AI <0.2  SSB (La) (ENA) Antibody, IgG 0.0 - 0.9 AI <0.2  ds DNA Ab 0 - 9 IU/mL <1    Additional serum labs TSH: 0.095 (low) fT4 0.72 (wnl) HIV neg  Unresulted Labs (From admission, onward)     Start     Ordered   06/25/22 4782  Basic metabolic panel  Tomorrow morning,   R       Question:  Specimen collection method  Answer:  Unit=Unit collect   06/24/22 1132   06/25/22 0500  Magnesium  Tomorrow morning,   R       Question:  Specimen collection method  Answer:  Unit=Unit collect   06/24/22 1132   06/25/22 0500  Phosphorus  Tomorrow morning,   R       Question:  Specimen collection method  Answer:  Unit=Unit collect   06/24/22 1132   06/23/22 0500  Comprehensive metabolic panel  Daily at 5am,   R     Question:  Specimen collection method  Answer:  Unit=Unit collect   06/22/22 0809   06/23/22 0500  CBC  Daily at 5am,   R     Question:  Specimen collection method  Answer:  Unit=Unit collect   06/22/22 0809   06/20/22 1737  Miscellaneous [facility] test (send-out)  Once,   R        06/20/22 1737   06/20/22 1029  Miscellaneous [facility] test (send-out)  Once,   R       Question:  Test name / description:  Answer:  Autoimmune Encephalitis panel on CSF  06/20/22 1028   06/20/22 1029  Miscellaneous [facility] test (send-out)  Once,   R       Question:  Test name / description:  Answer:  Autoimmune Encephalitis panel on Serum   06/20/22 1028   06/20/22 1029  Oligoclonal bands, CSF + serum  (Oligoclonal Bands, CSF + Serum panel)  Once,   R        06/20/22 1028   06/20/22 1029  Draw extra clot tube  (Oligoclonal Bands, CSF + Serum panel)  Once,   R       Question:  Specimen collection method  Answer:  IV Team=IV Team collect    06/20/22 1028   06/20/22 1029  Draw extra clot tube  (IgG CSF Index, CSF + Serum panel)  Once,   R       Question:  Specimen collection method  Answer:  IV Team=IV Team collect   06/20/22 1028             MRI brain w/o contrast 06/17/2022 personally reviewed, agree with radiology:   No acute intracranial pathology or epileptogenic focus identified.   MRI brain and C-spine 06/20/2022 1. Negative for acute infarct or mass. 2. Diffuse pachymeningeal enhancement and mild thickening. Sulcal hyperintensity on FLAIR has progressed in the interval. Mild pituitary enlargement for age unchanged. The mamillopontine distance is narrowed at 4.3 mm. Findings can be seen with intracranial hypotension however some of these findings can also be seen with meningitis. 3. No evidence of spinal infection in the cervical spine. Cervical spondylosis causing foraminal narrowing as described above.  CT abdomen pelvis w/o contrast 06/15/22 Multiple dilated loops of small bowel with a transition zone in the mid to distal ileum consistent with a least partial small bowel obstruction. Bibasilar consolidation with left-sided pleural effusions stable from the prior CT. Cirrhotic changes of the liver with ascites. Ascites may also be in part due to the underlying obstructive change.    CT chest 11/30 a.m. formal read pending but on personal review with CCM team there is some concern for pneumonia   Assessment: With initiation of steroids, patients exam slightly improved but has remained otherwise stable  Other considerations for infectious causes resulting in the patient's CSF findings other than Listeria would be HSV/VZV versus TB.  At this time low concern for TB based on CT chest (in discussion with CCM team), as she has had negative HSV/VZV PCR but I attempted to add on VZV IgG from CSF for confirmation, however, cannot find this lab in process at this time.   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 Potential for autoimmune component  -Ileus/concern for partial small bowel obstruction, appreciate surgery following, signed off 11/30 due to improvement -Acute respiratory  failure due to shock AKI and encephalopathy -Prolonged Qtc -Chronic anemia  Recommendations:  # Encephalopathy # CSF Inflammatory process, infectious vs. autoimmune -Follow-up pending repeat CSF studies from 11/29 oligoclonal bands, bacterial culture, fungal culture, anaerobic culture, autoimmune CSF encephalitis panel, VZV IgG CSF -Continue pulse dose steroids 1 g daily for 5 days, keep 20 hours given first dose was at 2230 on 11/29, complete dosing on 12/3, pending this would continue 60 daily for at least a month given unclear underlying diagnosis, pending further evaluation  -After 1 month if high dose steroids are continued, consider PJP prophylaxsis -Follow-up serum autoimmune studies: oligoclonal bands, autoimmune serum encephalitis panel -Now with worsening azotemia   #  Clinical concern for seizure, seizure free on Keppra - LTM EEG 11/26 - 11/29 negative for seizures, after keppra initiated  - s/p Keppra 3500 mg load, then 1000 mg BID; given renal function and somnolence reduced to 500 mg BID on 11/27, will continue for now but may need to reduce if renal function declines further Estimated Creatinine Clearance: 24.1 mL/min (A) (by C-G formula based on SCr of 2.7 mg/dL (H)).   CrCl 80 to 130 mL/minute/1.73 m2: 500 mg to 1.5 g every 12 hours.  CrCl 50 to <80 mL/minute/1.73 m2: 500 mg to 1 g every 12 hours.  CrCl 30 to <50 mL/minute/1.73 m2: 250 to 750 mg every 12 hours.  CrCl 15 to <30 mL/minute/1.73 m2: 250 to 500 mg every 12 hours.  CrCl <15 mL/minute/1.73 m2: 250 to 500 mg every 24 hours (expert opinion).  CRITICAL CARE Performed by: Lorenza Chick If 7pm- 7am, please page neurology on call as listed in [place].  Total critical care time: 40 minutes  Critical care time was exclusive of separately billable procedures and treating other patients.  Critical care was necessary to treat or prevent imminent or life-threatening deterioration.  Critical care was time spent personally by me on the following activities: development of treatment plan with patient and/or surrogate as well as nursing, discussions with consultants, evaluation of patient's response to treatment, examination of patient, obtaining history from patient or surrogate, ordering and performing treatments and interventions, ordering and review of laboratory studies, ordering and review of radiographic studies, pulse oximetry and re-evaluation of patient's condition.

## 2022-06-24 NOTE — Plan of Care (Signed)
  Problem: Education: Goal: Knowledge of General Education information will improve Description: Including pain rating scale, medication(s)/side effects and non-pharmacologic comfort measures Outcome: Not Progressing   Problem: Health Behavior/Discharge Planning: Goal: Ability to manage health-related needs will improve Outcome: Not Progressing   Problem: Clinical Measurements: Goal: Ability to maintain clinical measurements within normal limits will improve Outcome: Not Progressing Goal: Will remain free from infection Outcome: Not Progressing Goal: Diagnostic test results will improve Outcome: Not Progressing Goal: Respiratory complications will improve Outcome: Not Progressing Goal: Cardiovascular complication will be avoided Outcome: Not Progressing   Problem: Activity: Goal: Risk for activity intolerance will decrease Outcome: Not Progressing   Problem: Nutrition: Goal: Adequate nutrition will be maintained Outcome: Not Progressing   Problem: Coping: Goal: Level of anxiety will decrease Outcome: Not Progressing   Problem: Elimination: Goal: Will not experience complications related to bowel motility Outcome: Not Progressing Goal: Will not experience complications related to urinary retention Outcome: Not Progressing   Problem: Pain Managment: Goal: General experience of comfort will improve Outcome: Not Progressing   Problem: Safety: Goal: Ability to remain free from injury will improve Outcome: Not Progressing   Problem: Skin Integrity: Goal: Risk for impaired skin integrity will decrease Outcome: Not Progressing   Problem: Education: Goal: Ability to describe self-care measures that may prevent or decrease complications (Diabetes Survival Skills Education) will improve Outcome: Not Progressing Goal: Individualized Educational Video(s) Outcome: Not Progressing   Problem: Coping: Goal: Ability to adjust to condition or change in health will  improve Outcome: Not Progressing   Problem: Fluid Volume: Goal: Ability to maintain a balanced intake and output will improve Outcome: Not Progressing   Problem: Health Behavior/Discharge Planning: Goal: Ability to identify and utilize available resources and services will improve Outcome: Not Progressing Goal: Ability to manage health-related needs will improve Outcome: Not Progressing   Problem: Metabolic: Goal: Ability to maintain appropriate glucose levels will improve Outcome: Not Progressing   Problem: Nutritional: Goal: Maintenance of adequate nutrition will improve Outcome: Not Progressing Goal: Progress toward achieving an optimal weight will improve Outcome: Not Progressing   Problem: Skin Integrity: Goal: Risk for impaired skin integrity will decrease Outcome: Not Progressing   Problem: Tissue Perfusion: Goal: Adequacy of tissue perfusion will improve Outcome: Not Progressing   Problem: Activity: Goal: Ability to tolerate increased activity will improve Outcome: Not Progressing   Problem: Respiratory: Goal: Ability to maintain a clear airway and adequate ventilation will improve Outcome: Not Progressing   Problem: Role Relationship: Goal: Method of communication will improve Outcome: Not Progressing   

## 2022-06-24 NOTE — Progress Notes (Signed)
NAME:  Deanna Mcmillan, MRN:  [id number], DOB:  [birth year], LOS: 41 ADMISSION DATE:  06/12/2022, CONSULTATION DATE:  11/7 REFERRING MD:  Lendon Colonel, EDP CHIEF COMPLAINT:  AMS   History of Present Illness:  51 yo woman with hx of recent diagnosis of alcoholic hepatitis (dx 97/67), here with ams, fever.  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 Seizure-EEG neg Started on pulse steroids per neuro for autoimmune cause    Recently seen at [facility] 10/19: LE Edema. Lasix not helpful at home.  Admitted for diuresis.  Jaundice and abd distension also noted.  Pleural effusion.  GI saw: non enough ascites for tap  Started lasix and spironolactone.  28 day course of prednisolone, miralax, lactulose, miralax. Midodrine TID     Pertinent  Medical History  ETOH Cirrhosis BLE edema recently worsening  S/p lap appy 2014    Echo: recent normal 34/1937   Home meds: folic ascid, acetaminohpen, lasix 40bid, lactulose 30 TID protonix, prednisone (04/29/22), prednisolone 04/28/22 three week course, thiamine, simethicone,    Per her mother she had been drinking at least one bottle of wine daily  Significant Hospital Events: Including procedures, antibiotic start and stop dates in addition to other pertinent events   11/7: Intubated in ED and admitted to ICU 11/7: CTH neg, CT A/P cirrhosis with portal hypertension, splenomegaly, moderate volume ascites, moderate bilateral pleural effusion Blood culture - Listeria and MSSA and MSSE (per mom health dept thinks she got it at [facility] but patient does love cheese) Urine culture - Pan sensitive E colii 11/8 cutlures Trach - MSSA Blood - neg 11/9 KUB: No ileus 11/10: LP confirms listeria meningitis 11/11 KUB: ileus 11/12 ileus resolved with aggressive bowel regimen 11/15 KUB:  No ileus. 11/18 more awake, following commands 1121 RUE PICC 11/22 extubated to BiPAP 11/23 - 14d of Rx complerted for MSSA/MESSE. For listeria - ID wants to extend amp/gent to 3 weeks 11/8-11/29. Ileus post extubation and NG to LIS . Carrabelle increased. BP sofft an dlevophed and alb Rx.   11/24 -  OG placed yesterday for ileus and bile returns. TF on hold. Lactulose increased yesterday. REamins off vent. Did not need BiPAP. This AM more obtunded but mom thinks was awake at night and is sleeping due to fatigue. Afebrile.  But wBC up 13L. Neg baklance -3L:  OFF PRESSORs = Ammondia up at 72 - [place] mild periphepatic ascites Blood culture  11/25 - Now intubated due to obtunded encephaopathy. CTAbc with SBO v Ileus with transition ponit . CCS consult - no surgery but ok for rectal lactulose w ith continue NG/OG tube to LIS. ID ordered repeat blood culture. Linezolid added.  No ntrition since 06/14/22 Pm. Creat worsening. LEss Ur OP. On levophed 61mcg. Not on sedation. Vent 40% Ammonia 108 Trach aspirate 11/27 - Remains intubated. On propofol 108mcg and levophed 63mcg. Febrile up to 101.75F overnight. On ampicillin and linezolid, going for IR guided LP today. Kidney function improving, repleting potassium. No seizure activity noted overnight on EEG. Surgery following for ileus, no need for surgical intervention. Holding TF for today.  11/29 - started on pulse dose steroids, getting autoimmune workup, CSF studies Interim History / Subjective:   Remains critically ill, intubated, on low-dose Levophed Afebrile Urine output remains poor at 500 cc but has about 1 L of liquid stool  with lactulose  Objective   Blood pressure (!) 112/48, pulse 89, temperature 99.8 F (37.7 C), temperature source Oral, resp. rate (!) 31, weight 74.4 kg, SpO2 92 %.    Vent Mode: CPAP;PSV FiO2 (%):  [40 %] 40 % Set Rate:  [20 bmp-30 bmp] 20 bmp Vt Set:  [430 mL] 430 mL PEEP:  [5 cmH20] 5 cmH20 Pressure Support:  [12 cmH20-16  cmH20] 12 cmH20 Plateau Pressure:  [16 cmH20-18 cmH20] 16 cmH20   Intake/Output Summary (Last 24 hours) at 06/24/2022 1118 Last data filed at 06/24/2022 1000 Gross per 24 hour  Intake 3893.74 ml  Output 1395 ml  Net 2498.74 ml    Filed Weights   06/21/22 0100 06/22/22 0500 06/24/22 0500  Weight: 66.7 kg 66.9 kg 74.4 kg   General: critically ill-appearing middle aged female, laying in bed, on MV, NAD. HENT: [place]/AT, ETT and G tube in place. Eyes: opens spontaneously, PERRL, EOMI. Scleral icterus present. CV: tachycardic rate and regular rhythm, no murmurs. Pulm: Bilateral decreased breath sounds, no accessory muscle use GI: soft, mildly distended, normoactive bowel sounds. Skin: warm and dry. Extensive bruising remains on bilateral UE.  1+ edema Neuro: eyes open spontaneously, does not track today, does not follow commands   Labs show increasing BUN to 130 and creatinine 2.7, normal electrolytes, stable leukocytosis and anemia, mild thrombocytopenia, bicarb decreased to 16  Resolved Hospital Problem list   Hypokalemia Hypophosphatemia Hypernatremia Thrombocytopenia Ileus  Assessment & Plan:   Shock, circulatory versus septic Klebsiella HAP -weaning levophed as tolerated -continue midodrine 10mg  q8h per tube    Toxic metabolic encephalopathy Cirrhosis [MELD-Na 17, Child Pugh Class C] New GTC seizure (11/26) Persistent encephalopathy of unclear etiology, although slowly improving. Has been off sedation for multiple days. CSF IgG and albumin are elevated, suggestive of autoimmune component. On pulse dose steroids, day 5. CSF Oligoclonal band testing and cytology pending. Negative infectious testing.  -appreciate neurology and ID assistance -Decrease lactulose to once daily and continue  rifaximin per tube BID -continue keppra -Per neurology continue prednisone 60mg  daily for at least a month  -f/u CSF oligoclonal bands and cytology -f/u remaining autoimmune workup -Will  add modafinil 100 mg  Acute hypoxic respiratory failure   -extubated 11/22, reintubated 11/24 -does not yet meet criteria for SBT/extubation given shock, AKI, and encephalopathy -has been on MV for over 2 weeks, will need trach placement this coming week   Klebsiella oxytoca on resp culture Listeria bacteremia and meningitis - completed therapy MSSA bacteremia - completed therapy -appreciate ID recommendations -merrem, day 7  AKI (Baseline 0.6- 0.9)  Kidney function continuing to gradually worsen with worsening uremia, although mental status stable/marginally improved.  -Metabolic acidosis related to diarrhea -Decrease lactulose -Add bicarbonate drip 50/hour -avoid nephrotoxic medications as able -monitor UOP -trend kidney function   Hyperglycemia, uncontrolled Patient currently receiving pulse dose steroids and D5W maintenance. TF have been advanced to goal. Will add basal coverage for better glycemic control, but will need to readjust once she finishes pulse dose steroids and is off D5W. -resistant SSI -TF coverage with novolog 6u q4h -Decrease Levemir to once daily now that steroids being decreased and D5W of -goal BG 140-180  Hypernatremia - improving -DC D5W  Severe physical deconditioning Protein caloric malnutrition, mild -TF at goal - She will need aggressive PT/OT (likely CIR) for strength training.  Prolonged QTc interval  Hx of SVT - monitor - avoid QTc prolonging medications  Anemia - chronic (baseline 8gm-9gm% in oct 2023) -trend  hgb curve, transfuse if hgb <7 and/or hemodynamic instability  Summary -she has completed treatment for Listeria meningitis and bacteremia but is persistently encephalopathic, neuro is pursuing autoimmune etiology and she received pulse dose steroids.  Course has been complicated by renal failure/azotemia likely due to severe diarrhea from lactulose.  She will need a tracheostomy this week, mom does want to push forward  Best  Practice (right click and "Reselect all SmartList Selections" daily)   Diet/type: tubefeeds  DVT prophylaxis: prophylactic heparin  GI prophylaxis: PPI Lines: Central line - RUE PICC since 06/12/22 Foley:  Yes, and it is still needed Code Status:  full code Last date of multidisciplinary goals of care discussion [updated mother at bedside daily, she is agreeable to tracheostomy     LABS   CBC Recent Labs  Lab 06/22/22 0304 06/23/22 0259 06/24/22 0259  HGB 8.5* 8.4* 8.3*  HCT 26.2* 25.8* 25.0*  WBC 22.1* 24.2* 23.6*  PLT 158 127* 110*     CHEMISTRY Recent Labs  Lab 06/19/22 0402 06/19/22 2025 06/21/22 0409 06/22/22 0304 06/22/22 1249 06/23/22 0259 06/23/22 1247 06/24/22 0259  NA 142   < > 150* 153* 149* 147*  --  142  K 3.1*   < > 3.5 3.0* 3.2* 3.0* 3.4* 3.6  CL 102   < > 111 115* 111 115*  --  108  CO2 23   < > 21* 21* 19* 19*  --  16*  GLUCOSE 147*   < > 210* 248* 410* 249*  --  278*  BUN 59*   < > 82* 100* 99* 113*  --  131*  CREATININE 1.83*   < > 1.99* 2.22* 2.27* 2.42*  --  2.70*  CALCIUM 7.3*   < > 8.3* 8.9 9.0 8.7*  --  8.9  MG 2.6*  --   --   --   --   --  2.6* 2.7*  PHOS  --   --   --   --   --   --   --  2.8   < > = values in this interval not displayed.    Estimated Creatinine Clearance: 24.1 mL/min (A) (by C-G formula based on SCr of 2.7 mg/dL (H)).   LIVER Recent Labs  Lab 06/20/22 0352 06/20/22 1141 06/20/22 1355 06/21/22 0409 06/22/22 0304 06/23/22 0259 06/24/22 0259  AST 40  --   --  47* 67* 80* 97*  ALT 22  --   --  20 21 23 26   ALKPHOS 87  --   --  81 89 105 120  BILITOT 4.9* 4.7*  --  4.7* 4.0* 3.4* 3.4*  PROT 6.4*  --   --  6.3* 6.2* 6.4* 6.1*  ALBUMIN 2.9*  --  3.1* 2.8* 2.7* 2.6* 2.6*     ENDOCRINE CBG (last 3)  Recent Labs    06/23/22 2312 06/24/22 0307 06/24/22 0747  GLUCAP 233* 258* 263*     My independent critical care time was 35 minutes   Kara Mead MD. FCCP. [facility] Pager  : [phone]  If no response to pager , please call [phone] until 7 pm After 7:00 pm call Elink  [phone]   06/24/2022

## 2022-06-25 DIAGNOSIS — G9340 Encephalopathy, unspecified: Secondary | ICD-10-CM | POA: Diagnosis not present

## 2022-06-25 DIAGNOSIS — J9601 Acute respiratory failure with hypoxia: Secondary | ICD-10-CM | POA: Diagnosis not present

## 2022-06-25 LAB — PHOSPHORUS: Phosphorus: 4.3 mg/dL (ref 2.5–4.6)

## 2022-06-25 LAB — CBC
HCT: 23.3 % — ABNORMAL LOW (ref 36.0–46.0)
Hemoglobin: 7.8 g/dL — ABNORMAL LOW (ref 12.0–15.0)
MCH: 37.3 pg — ABNORMAL HIGH (ref 26.0–34.0)
MCHC: 33.5 g/dL (ref 30.0–36.0)
MCV: 111.5 fL — ABNORMAL HIGH (ref 80.0–100.0)
Platelets: 106 10*3/uL — ABNORMAL LOW (ref 150–400)
RBC: 2.09 MIL/uL — ABNORMAL LOW (ref 3.87–5.11)
RDW: 15.8 % — ABNORMAL HIGH (ref 11.5–15.5)
WBC: 20.8 10*3/uL — ABNORMAL HIGH (ref 4.0–10.5)
nRBC: 8.2 % — ABNORMAL HIGH (ref 0.0–0.2)

## 2022-06-25 LAB — COMPREHENSIVE METABOLIC PANEL
ALT: 30 U/L (ref 0–44)
AST: 98 U/L — ABNORMAL HIGH (ref 15–41)
Albumin: 2.4 g/dL — ABNORMAL LOW (ref 3.5–5.0)
Alkaline Phosphatase: 105 U/L (ref 38–126)
Anion gap: 16 — ABNORMAL HIGH (ref 5–15)
BUN: 148 mg/dL — ABNORMAL HIGH (ref 6–20)
CO2: 22 mmol/L (ref 22–32)
Calcium: 8 mg/dL — ABNORMAL LOW (ref 8.9–10.3)
Chloride: 100 mmol/L (ref 98–111)
Creatinine, Ser: 2.84 mg/dL — ABNORMAL HIGH (ref 0.44–1.00)
GFR, Estimated: 19 mL/min — ABNORMAL LOW (ref >60)
Glucose, Bld: 226 mg/dL — ABNORMAL HIGH (ref 70–99)
Potassium: 4.1 mmol/L (ref 3.5–5.1)
Sodium: 138 mmol/L (ref 135–145)
Total Bilirubin: 3.4 mg/dL — ABNORMAL HIGH (ref 0.3–1.2)
Total Protein: 5.7 g/dL — ABNORMAL LOW (ref 6.5–8.1)

## 2022-06-25 LAB — GLUCOSE, CAPILLARY
Glucose-Capillary: 213 mg/dL — ABNORMAL HIGH (ref 70–99)
Glucose-Capillary: 244 mg/dL — ABNORMAL HIGH (ref 70–99)
Glucose-Capillary: 271 mg/dL — ABNORMAL HIGH (ref 70–99)
Glucose-Capillary: 227 mg/dL — ABNORMAL HIGH (ref 70–99)
Glucose-Capillary: 236 mg/dL — ABNORMAL HIGH (ref 70–99)
Glucose-Capillary: 257 mg/dL — ABNORMAL HIGH (ref 70–99)

## 2022-06-25 LAB — ANAEROBIC CULTURE W GRAM STAIN: Gram Stain: NONE SEEN

## 2022-06-25 LAB — MAGNESIUM: Magnesium: 2.4 mg/dL (ref 1.7–2.4)

## 2022-06-25 LAB — OLIGOCLONAL BANDS, CSF + SERM

## 2022-06-25 MED ORDER — INSULIN ASPART 100 UNIT/ML IJ SOLN: 3.0000 [IU] | INTRAMUSCULAR | Status: DC

## 2022-06-25 MED ORDER — SODIUM CHLORIDE 0.9 % IV SOLN
2.0000 g | Freq: Three times a day (TID) | INTRAVENOUS | Status: DC
  Administered 2022-06-25 – 2022-06-27 (×5): 2 g via INTRAVENOUS
  Filled 2022-06-25 (×7): qty 2000

## 2022-06-25 MED ORDER — INSULIN ASPART 100 UNIT/ML IJ SOLN: 4.0000 [IU] | INTRAMUSCULAR | Status: DC

## 2022-06-25 MED ORDER — INSULIN ASPART 100 UNIT/ML IJ SOLN
6.0000 [IU] | INTRAMUSCULAR | Status: DC
  Administered 2022-06-25 – 2022-06-27 (×6): 6 [IU] via SUBCUTANEOUS

## 2022-06-25 NOTE — Progress Notes (Signed)
[facility]   Reason for visit: Follow up on meningitis  Interval History: Tmax 100.4, WBC 20.8.  Mother at bedside.   Day 28 total antibiotics  Physical Exam: Constitutional:  Vitals:   06/25/22 1100 06/25/22 1200  BP: (!) 136/50 (!) 129/56  Pulse: (!) 109 (!) 104  Resp: (!) 33 (!) 36  Temp:  (!) 100.4 F (38 C)  SpO2: 92% 92%  Not alert Respiratory: respiratory effort on vent GI: soft  Review of Systems: Unable to be assessed due to mental status  Lab Results  Component Value Date   WBC 20.8 (H) 06/25/2022   HGB 7.8 (L) 06/25/2022   HCT 23.3 (L) 06/25/2022   MCV 111.5 (H) 06/25/2022   PLT 106 (L) 06/25/2022    Lab Results  Component Value Date   CREATININE 2.84 (H) 06/25/2022   BUN 148 (H) 06/25/2022   NA 138 06/25/2022   K 4.1 06/25/2022   CL 100 06/25/2022   CO2 22 06/25/2022    Lab Results  Component Value Date   ALT 30 06/25/2022   AST 98 (H) 06/25/2022   ALKPHOS 105 06/25/2022     Microbiology: Recent Results (from the past 240 hour(s))  Culture, blood (Routine X 2) w Reflex to ID Panel     Status: None   Collection Time: 06/15/22  1:44 PM   Specimen: BLOOD  Result Value Ref Range Status   Specimen Description BLOOD BLOOD LEFT HAND  Final   Special Requests   Final    BOTTLES DRAWN AEROBIC AND ANAEROBIC Blood Culture adequate volume   Culture   Final    NO GROWTH 5 DAYS Performed at [facility], [address] [place], [place] [address]    Report Status 06/20/2022 FINAL  Final  Culture, blood (Routine X 2) w Reflex to ID Panel     Status: None   Collection Time: 06/15/22  1:51 PM   Specimen: BLOOD  Result Value Ref Range Status   Specimen Description BLOOD BLOOD LEFT ARM IN PEDIATRIC BOTTLE  Final   Special Requests IN PEDIATRIC BOTTLE Blood Culture adequate volume  Final   Culture   Final    NO GROWTH 5 DAYS Performed at [facility], [place] [address], [place], [place] [address]    Report  Status 06/20/2022 FINAL  Final  Culture, Respiratory w Gram Stain     Status: None   Collection Time: 06/16/22  8:33 AM   Specimen: Tracheal Aspirate; Respiratory  Result Value Ref Range Status   Specimen Description TRACHEAL ASPIRATE  Final   Special Requests NONE  Final   Gram Stain   Final    NO ORGANISMS SEEN NO WBC SEEN Performed at [facility], [address], [place], [place] [address]    Culture RARE KLEBSIELLA OXYTOCA  Final   Report Status 06/19/2022 FINAL  Final   Organism ID, Bacteria KLEBSIELLA OXYTOCA  Final      Susceptibility   Klebsiella oxytoca - MIC*    AMPICILLIN >=32 RESISTANT Resistant     CEFAZOLIN >=64 RESISTANT Resistant     CEFEPIME 1 SENSITIVE Sensitive     CEFTAZIDIME <=1 SENSITIVE Sensitive     CEFTRIAXONE 4 RESISTANT Resistant     CIPROFLOXACIN <=0.25 SENSITIVE Sensitive     GENTAMICIN <=1 SENSITIVE Sensitive     IMIPENEM <=0.25 SENSITIVE Sensitive     TRIMETH/SULFA <=20 SENSITIVE Sensitive     AMPICILLIN/SULBACTAM >=32 RESISTANT  Resistant     PIP/TAZO >=128 RESISTANT Resistant     * RARE KLEBSIELLA OXYTOCA  CSF culture w Gram Stain     Status: None   Collection Time: 06/18/22 11:04 AM   Specimen: PATH Cytology CSF; Cerebrospinal Fluid  Result Value Ref Range Status   Specimen Description CSF  Final   Special Requests NONE  Final   Gram Stain   Final    WBC PRESENT, PREDOMINANTLY MONONUCLEAR NO ORGANISMS SEEN CYTOSPIN SMEAR    Culture   Final    NO GROWTH 3 DAYS Performed at [facility], [place] [address] [place], [place] [address]    Report Status 06/21/2022 FINAL  Final  CSF culture w Gram Stain     Status: None   Collection Time: 06/20/22  1:55 PM   Specimen: PATH Cytology CSF; Cerebrospinal Fluid  Result Value Ref Range Status   Specimen Description CSF  Final   Special Requests NONE  Final   Gram Stain NO WBC SEEN NO ORGANISMS SEEN   Final   Culture   Final    NO GROWTH 3 DAYS Performed at [facility],  [place] [address], [place], [place] [address]    Report Status 06/23/2022 FINAL  Final  Fungus Culture With Stain     Status: None (Preliminary result)   Collection Time: 06/20/22  1:55 PM   Specimen: PATH Cytology CSF; Cerebrospinal Fluid  Result Value Ref Range Status   Fungus Stain Final report  Final    Comment: (NOTE) Performed At: [facility] [place], [place] [id number] Rush Farmer MD [id number]    Fungus (Mycology) Culture PENDING  Incomplete   Fungal Source CSF  Final    Comment: Performed at [facility], [place] [address] [place], [place] [address]  Anaerobic culture w Gram Stain     Status: None (Preliminary result)   Collection Time: 06/20/22  1:55 PM   Specimen: PATH Cytology CSF; Cerebrospinal Fluid  Result Value Ref Range Status   Specimen Description CSF  Final   Special Requests NONE  Final   Gram Stain   Final    NO WBC SEEN NO ORGANISMS SEEN Performed at [facility], [place] [address], [place], [place] [address]    Culture   Final    NO ANAEROBES ISOLATED; CULTURE IN PROGRESS FOR 5 DAYS   Report Status PENDING  Incomplete  Fungus Culture Result     Status: None   Collection Time: 06/20/22  1:55 PM  Result Value Ref Range Status   Result 1 Comment  Final    Comment: (NOTE) KOH/Calcofluor preparation:  no fungus observed. Performed At: [facility] [place], [place] [id number] Rush Farmer MD DX:[id number]     Impression/Plan:  1. Listeria meningoencephalitis, possible - she has had a positive blood cultures in 2 sets in all 4 bottles positive for listeria now on treatment for 4 weeks.  Most recent MRI on 11/28 with notable diffuse parenchymal enhancement, progressed from the non-contrast MRI on 11/26.   This continues to be an unclear picture.  From an ID standpoint, Listeria CNS disease certainly possible and treatment can be up to 6 weeks so will continue with ampicillin for now.  I see that steroids  have been stopped with little improvement overall.   Will transition to ampicillin with completion of the meropenem and continue to observe.   I have personally spent 60 minutes involved in face-to-face and  non-face-to-face activities for this patient on the day of the visit. Professional time spent includes the following activities: Preparing to see the patient (review of tests), Obtaining and/or reviewing separately obtained history (admission/discharge record), Performing a medically appropriate examination and/or evaluation , Ordering medications/tests/procedures, referring and communicating with other health care professionals, Documenting clinical information in the EMR, Independently interpreting results (not separately reported), Communicating results to the patient/family/caregiver, Counseling and educating the patient/family/caregiver and Care coordination (not separately reported).

## 2022-06-25 NOTE — Progress Notes (Signed)
NAME:  Deanna Mcmillan, MRN:  [id number], DOB:  October 20, 1970, LOS: 67 ADMISSION DATE:  06/01/2022, CONSULTATION DATE:  11/7 REFERRING MD:  Lendon Colonel, EDP CHIEF COMPLAINT:  AMS   History of Present Illness:  51 yo woman with hx of recent diagnosis of alcoholic hepatitis (dx 09/62), here with ams, fever.  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 Seizure-EEG neg Started on pulse steroids per neuro for autoimmune cause    Recently seen at [facility] 10/19: LE Edema. Lasix not helpful at home.  Admitted for diuresis.  Jaundice and abd distension also noted.  Pleural effusion.  GI saw: non enough ascites for tap  Started lasix and spironolactone.  28 day course of prednisolone, miralax, lactulose, miralax. Midodrine TID     Pertinent  Medical History  ETOH Cirrhosis BLE edema recently worsening  S/p lap appy 2014    Echo: recent normal 83/6629   Home meds: folic ascid, acetaminohpen, lasix 40bid, lactulose 30 TID protonix, prednisone (04/29/22), prednisolone 04/28/22 three week course, thiamine, simethicone,    Per her mother she had been drinking at least one bottle of wine daily  Significant Hospital Events: Including procedures, antibiotic start and stop dates in addition to other pertinent events   11/7: Intubated in ED and admitted to ICU 11/7: CTH neg, CT A/P cirrhosis with portal hypertension, splenomegaly, moderate volume ascites, moderate bilateral pleural effusion Blood culture - Listeria and MSSA and MSSE (per mom health dept thinks she got it at [facility] but patient does love cheese) Urine culture - Pan sensitive E colii 11/8 cutlures Trach - MSSA Blood - neg 11/9 KUB: No ileus 11/10: LP confirms listeria meningitis 11/11 KUB: ileus 11/12 ileus resolved with aggressive bowel regimen 11/15 KUB:  No ileus. 11/18 more awake, following commands 1121 RUE PICC 11/22 extubated to BiPAP 11/23 - 14d of Rx complerted for MSSA/MESSE. For listeria - ID wants to extend amp/gent to 3 weeks 11/8-11/29. Ileus post extubation and NG to LIS . [place] increased. BP sofft an dlevophed and alb Rx.   11/24 -  OG placed yesterday for ileus and bile returns. TF on hold. Lactulose increased yesterday. REamins off vent. Did not need BiPAP. This AM more obtunded but mom thinks was awake at night and is sleeping due to fatigue. Afebrile.  But wBC up 13L. Neg baklance -3L:  OFF PRESSORs = Ammonia up at 72 - [place] mild periphepatic ascites Blood culture  11/25 - Now intubated due to obtunded encephaopathy. CTAbc with SBO v Ileus with transition ponit . CCS consult - no surgery but ok for rectal lactulose w ith continue NG/OG tube to LIS. ID ordered repeat blood culture. Linezolid added.  No ntrition since 06/14/22 Pm. Creat worsening. LEss Ur OP. On levophed 43mcg. Not on sedation. Vent 40% Ammonia 108 Trach aspirate 11/27 - Remains intubated. On propofol 58mcg and levophed 67mcg. Febrile up to 101.74F overnight. On ampicillin and linezolid, going for IR guided LP today. Kidney function improving, repleting potassium. No seizure activity noted overnight on EEG. Surgery following for ileus, no need for surgical intervention. Holding TF for today.  11/29 - started on pulse dose steroids, getting autoimmune workup, CSF studies Interim History / Subjective:   Remains critically ill, intubated. She is off pressors. Did not tolerate weaning trial this AM, placed back on full support. Extensive discussion with  mom about guarded prognosis. Continue full scope of care for now.  Objective   Blood pressure (!) 129/56, pulse (!) 104, temperature (!) 100.4 F (38 C), temperature source Axillary, resp. rate (!) 36, weight 76 kg, SpO2 92 %.    Vent Mode: PRVC FiO2 (%):  [40 %] 40 % Set Rate:  [20 bmp] 20 bmp Vt Set:  [430 mL]  430 mL PEEP:  [5 cmH20] 5 cmH20 Pressure Support:  [12 cmH20] 12 cmH20 Plateau Pressure:  [6 cmH20-17 cmH20] 8 cmH20   Intake/Output Summary (Last 24 hours) at 06/25/2022 1510 Last data filed at 06/25/2022 1500 Gross per 24 hour  Intake 3919.55 ml  Output 1430 ml  Net 2489.55 ml   Filed Weights   06/22/22 0500 06/24/22 0500 06/25/22 0406  Weight: 66.9 kg 74.4 kg 76 kg   General: critically ill-appearing middle aged female, laying in bed, on mech vent, NAD. Eyes: open spontaneously, PERRL, EOMI, scleral icterus present. CV: tachycardic rate and regular rhythm, no murmurs. Pulm: ventilated breath sounds, on full vent support.  GI: soft, mildly distended, normoactive bowel sounds. Skin: warm and dry. Bruising in bilateral UE again noted. Neuro: opens eyes spontaneously, does not orient to either side or track. Does not follow commands. No withdrawal to noxious stimuli in any extremity.    Resolved Hospital Problem list   Hypokalemia Hypophosphatemia Hypernatremia Thrombocytopenia Ileus  Assessment & Plan:   Shock, circulatory versus septic Klebsiella HAP - completed therapy -off of pressor support, tolerating -continue midodrine 10mg  q8h per tube  Toxic metabolic encephalopathy Cirrhosis [MELD-Na 17, Child Pugh Class C] New GTC seizure (11/26) Persistent encephalopathy, may potentially be sequelae of critical illness on top of preexisting liver dysfunction and now renal dysfunction. Steroids have been stopped given lack of clinically meaningful improvement in mental status per neurology. Nephrology has been consulted for severe azotemia. Extensive discussion held with mother about guarded prognosis, plans to continue full scope of care at this time but encouraged her to speak with family about patient's wishes. She did make it known that patient would not wish to stay at a nursing facility indefinitely. Per ID, listeria CNS disease remains possible and treatment can be up to 6  weeks, so have transitioned patient to ampicillin (finished course of merrem for klebsiella HAP).  -appreciate neurology and ID assistance -lactulose daily along with rifaximin BID -continue keppra -off steroids -modafinil 100mg  -f/u CSF oligoclonal bands and cytology -f/u remaining autoimmune workup   Acute hypoxic respiratory failure  Given lack of ability to wean, anticipate that patient may need ventilatory support for weeks to months. If plan remains to continue full scope of care, will need trach placement this week as she has remained on mechanical ventilation for over 2 weeks. Discussed with mother.  -extubated 11/22, reintubated 11/24 -does not yet meet criteria for SBT/extubation given shock, AKI, and encephalopathy  Listeria meningoencephalitis Listeria bacteremia - completed therapy MSSA bacteremia - completed therapy Unclear picture as listeria should have been treated by now. Per ID, listeria CNS disease can remain and treatment can be up to 6 weeks (currently on week 4 of treatment). Transitioned to ampicillin for continued listeria treatment. -appreciate ID recommendations -finished course of antibiotics for MSSA bacteremia -ampicillin monotherapy  AKI (Baseline 0.6- 0.9)  Severe Azotemia Metabolic acidosis 2/2 diarrhea - improving Kidney function has continued to worsen, now with Cr 2.8 and BUN up to 148. Nephrology has been consulted given mother would like full scope of care. Unclear if patient will be  a dialysis candidate given current guarded prognosis, but will defer to nephrology. -bicarbonate gtt @75  cc/hr -avoid nephrotoxic medications as able -monitor UOP -trend kidney function -f/u nephrology recommendations  Hyperglycemia, uncontrolled CBGs remaining in 200s, likely from residual effects of pulse dose steroids. Steroids have been discontinued so expect for improvement with current insulin regimen. Will avoid changing at this time. -resistant SSI -TF  coverage with novolog 6u q4h -levemir 10u daily -goal BG 140-180  Severe physical deconditioning Protein caloric malnutrition, mild She has remained bedbound in the ICU for almost 1 month now, has profound neuromuscular weakness. Anticipate that she will need months of physical therapy IF she has any clinically significant neurological recovery moving forward. -TF at goal -She will need aggressive PT/OT (likely CIR) for strength training.  Prolonged QTc interval  Hx of SVT - monitor - avoid QTc prolonging medications  Anemia - chronic (baseline 8gm-9gm% in oct 2023) -trend hgb curve, transfuse if hgb <7 and/or hemodynamic instability   Best Practice (right click and "Reselect all SmartList Selections" daily)   Diet/type: tubefeeds  DVT prophylaxis: prophylactic heparin  GI prophylaxis: PPI Lines: Central line - RUE PICC since 06/12/22 Foley:  Yes, and it is still needed Code Status:  full code Last date of multidisciplinary goals of care discussion [updated mother at bedside daily, she is agreeable to tracheostomy]    Virl Axe, MD 06/25/2022, 3:10 PM   LABS   CBC Recent Labs  Lab 06/23/22 0259 06/24/22 0259 06/25/22 0319  HGB 8.4* 8.3* 7.8*  HCT 25.8* 25.0* 23.3*  WBC 24.2* 23.6* 20.8*  PLT 127* 110* 106*    CHEMISTRY Recent Labs  Lab 06/19/22 0402 06/19/22 2025 06/22/22 0304 06/22/22 1249 06/23/22 0259 06/23/22 1247 06/24/22 0259 06/25/22 0319  NA 142   < > 153* 149* 147*  --  142 138  K 3.1*   < > 3.0* 3.2* 3.0* 3.4* 3.6 4.1  CL 102   < > 115* 111 115*  --  108 100  CO2 23   < > 21* 19* 19*  --  16* 22  GLUCOSE 147*   < > 248* 410* 249*  --  278* 226*  BUN 59*   < > 100* 99* 113*  --  131* 148*  CREATININE 1.83*   < > 2.22* 2.27* 2.42*  --  2.70* 2.84*  CALCIUM 7.3*   < > 8.9 9.0 8.7*  --  8.9 8.0*  MG 2.6*  --   --   --   --  2.6* 2.7* 2.4  PHOS  --   --   --   --   --   --  2.8 4.3   < > = values in this interval not displayed.    Estimated Creatinine Clearance: 23.1 mL/min (A) (by C-G formula based on SCr of 2.84 mg/dL (H)).   LIVER Recent Labs  Lab 06/21/22 0409 06/22/22 0304 06/23/22 0259 06/24/22 0259 06/25/22 0319  AST 47* 67* 80* 97* 98*  ALT 20 21 23 26 30   ALKPHOS 81 89 105 120 105  BILITOT 4.7* 4.0* 3.4* 3.4* 3.4*  PROT 6.3* 6.2* 6.4* 6.1* 5.7*  ALBUMIN 2.8* 2.7* 2.6* 2.6* 2.4*    ENDOCRINE CBG (last 3)  Recent Labs    06/25/22 0316 06/25/22 0819 06/25/22 1145  GLUCAP 213* 244* 271*

## 2022-06-25 NOTE — Plan of Care (Signed)
  Problem: Education: Goal: Knowledge of General Education information will improve Description: Including pain rating scale, medication(s)/side effects and non-pharmacologic comfort measures Outcome: Not Progressing   Problem: Health Behavior/Discharge Planning: Goal: Ability to manage health-related needs will improve Outcome: Not Progressing   Problem: Clinical Measurements: Goal: Ability to maintain clinical measurements within normal limits will improve Outcome: Not Progressing Goal: Will remain free from infection Outcome: Not Progressing Goal: Diagnostic test results will improve Outcome: Not Progressing Goal: Respiratory complications will improve Outcome: Not Progressing Goal: Cardiovascular complication will be avoided Outcome: Not Progressing   Problem: Activity: Goal: Risk for activity intolerance will decrease Outcome: Not Progressing   Problem: Nutrition: Goal: Adequate nutrition will be maintained Outcome: Not Progressing   Problem: Coping: Goal: Level of anxiety will decrease Outcome: Not Progressing   Problem: Elimination: Goal: Will not experience complications related to bowel motility Outcome: Not Progressing Goal: Will not experience complications related to urinary retention Outcome: Not Progressing   Problem: Pain Managment: Goal: General experience of comfort will improve Outcome: Not Progressing   Problem: Safety: Goal: Ability to remain free from injury will improve Outcome: Not Progressing   Problem: Skin Integrity: Goal: Risk for impaired skin integrity will decrease Outcome: Not Progressing   Problem: Education: Goal: Ability to describe self-care measures that may prevent or decrease complications (Diabetes Survival Skills Education) will improve Outcome: Not Progressing Goal: Individualized Educational Video(s) Outcome: Not Progressing   Problem: Coping: Goal: Ability to adjust to condition or change in health will  improve Outcome: Not Progressing   Problem: Fluid Volume: Goal: Ability to maintain a balanced intake and output will improve Outcome: Not Progressing   Problem: Health Behavior/Discharge Planning: Goal: Ability to identify and utilize available resources and services will improve Outcome: Not Progressing Goal: Ability to manage health-related needs will improve Outcome: Not Progressing   Problem: Metabolic: Goal: Ability to maintain appropriate glucose levels will improve Outcome: Not Progressing   Problem: Nutritional: Goal: Maintenance of adequate nutrition will improve Outcome: Not Progressing Goal: Progress toward achieving an optimal weight will improve Outcome: Not Progressing   Problem: Skin Integrity: Goal: Risk for impaired skin integrity will decrease Outcome: Not Progressing   Problem: Tissue Perfusion: Goal: Adequacy of tissue perfusion will improve Outcome: Not Progressing   Problem: Activity: Goal: Ability to tolerate increased activity will improve Outcome: Not Progressing   Problem: Respiratory: Goal: Ability to maintain a clear airway and adequate ventilation will improve Outcome: Not Progressing   Problem: Role Relationship: Goal: Method of communication will improve Outcome: Not Progressing   

## 2022-06-25 NOTE — Progress Notes (Incomplete)
Pharmacy Antibiotic Note  Deanna Mcmillan is a 51 y.o. female admitted on 06/20/2022 with {Indications:3041527}.  Pharmacy has been consulted for *** dosing.  Plan: {Assessment:21075}  Weight: 76 kg (167 lb 8.8 oz)  Temp (24hrs), Avg:100.2 F (37.9 C), Min:97.9 F (36.6 C), Max:102.8 F (39.3 C)  Recent Labs  Lab 06/21/22 0409 06/22/22 0304 06/22/22 1249 06/23/22 0259 06/24/22 0259 06/25/22 0319  WBC 14.0* 22.1*  --  24.2* 23.6* 20.8*  CREATININE 1.99* 2.22* 2.27* 2.42* 2.70* 2.84*    Estimated Creatinine Clearance: 23.1 mL/min (A) (by C-G formula based on SCr of 2.84 mg/dL (H)).    No Known Allergies  Antimicrobials this admission: *** *** >> *** *** *** >> ***  Dose adjustments this admission: ***  Microbiology results: *** BCx: *** *** UCx: ***  *** Sputum: ***  *** MRSA PCR: ***  Thank you for allowing pharmacy to be a part of this patient's care.  Lawson Radar 06/25/2022 5:03 PM

## 2022-06-25 NOTE — Inpatient Diabetes Management (Signed)
Inpatient Diabetes Program Recommendations  AACE/ADA: New Consensus Statement on Inpatient Glycemic Control (2015)  Target Ranges:  Prepandial:   less than 140 mg/dL      Peak postprandial:   less than 180 mg/dL (1-2 hours)      Critically ill patients:  140 - 180 mg/dL   Lab Results  Component Value Date   GLUCAP 244 (H) 06/25/2022   HGBA1C <4.2 (L) 04/25/2022    Review of Glycemic Control  Latest Reference Range & Units 06/24/22 19:19 06/24/22 23:25 06/25/22 03:16 06/25/22 08:19  Glucose-Capillary 70 - 99 mg/dL 227 (H) 224 (H) 213 (H) 244 (H)  (H): Data is abnormally high Diabetes history: No DM noted Outpatient Diabetes medications: none Current orders for Inpatient glycemic control: Levemir 10 units QD, Novolog 6 units q4H, Novolog 0-20 units Q4H Vital @55  ml/hr Solumedrol discontinued Inpatient Diabetes Program Recommendations:    Consider: - Increasing Levemir 15 units QD - Increasing Novolog 8 units Q4H for tube feed coverage (to be stopped or held in the event tube feeds are stopped).  Thanks, Bronson Curb, MSN, RNC-OB Diabetes Coordinator [phone] (8a-5p)

## 2022-06-25 NOTE — Plan of Care (Signed)
Neurology plan of care  Slight but not clinically meaningful improvement in mental status on high dose steroids, last dose 12/3. We are unable to clearly assess for improvement given her severe azotema (BUN now 148 from 131). As such we have decided not to continue further steroids at this time. Dr. Curly Shores discussed possible dialysis with nephrology who elected not to given her creatinine and the likelihood that the elevated BUN is not nephrologic in etiology. There is no further concern for seizures.  Recommendations: - Appreciate primary team following up on outstanding serum autoimmune labs and autoimmune serum encephalitis panel sent to [facility] (latter typically results in 2-3 weeks).  - Continue keppra 500mg  q 12 hrs (renal dosing). If CrCl falls below 15, change dosing to 500mg  q 24 hrs. - Neurology will not continue to actively follow given severe azotemia confounding mental status examination. Please reconsult if further neurology assistance is required.  D/w Dr. Thelma Comp, MD [facility] [phone]  If [place], please page neurology on call as listed in [place].

## 2022-06-26 ENCOUNTER — Inpatient Hospital Stay (HOSPITAL_COMMUNITY): Payer: 59

## 2022-06-26 DIAGNOSIS — J9601 Acute respiratory failure with hypoxia: Secondary | ICD-10-CM | POA: Diagnosis not present

## 2022-06-26 DIAGNOSIS — R569 Unspecified convulsions: Secondary | ICD-10-CM | POA: Diagnosis not present

## 2022-06-26 DIAGNOSIS — G9340 Encephalopathy, unspecified: Secondary | ICD-10-CM | POA: Diagnosis not present

## 2022-06-26 LAB — GLUCOSE, CAPILLARY
Glucose-Capillary: 248 mg/dL — ABNORMAL HIGH (ref 70–99)
Glucose-Capillary: 197 mg/dL — ABNORMAL HIGH (ref 70–99)
Glucose-Capillary: 174 mg/dL — ABNORMAL HIGH (ref 70–99)
Glucose-Capillary: 159 mg/dL — ABNORMAL HIGH (ref 70–99)
Glucose-Capillary: 122 mg/dL — ABNORMAL HIGH (ref 70–99)
Glucose-Capillary: 158 mg/dL — ABNORMAL HIGH (ref 70–99)

## 2022-06-26 LAB — URINALYSIS, COMPLETE (UACMP) WITH MICROSCOPIC
Bilirubin Urine: NEGATIVE
Glucose, UA: NEGATIVE mg/dL
Ketones, ur: NEGATIVE mg/dL
Leukocytes,Ua: NEGATIVE
Nitrite: NEGATIVE
Protein, ur: NEGATIVE mg/dL
Specific Gravity, Urine: 1.015 (ref 1.005–1.030)
pH: 5 (ref 5.0–8.0)

## 2022-06-26 LAB — BASIC METABOLIC PANEL
Anion gap: 19 — ABNORMAL HIGH (ref 5–15)
BUN: 168 mg/dL — ABNORMAL HIGH (ref 6–20)
CO2: 19 mmol/L — ABNORMAL LOW (ref 22–32)
Calcium: 8.2 mg/dL — ABNORMAL LOW (ref 8.9–10.3)
Chloride: 103 mmol/L (ref 98–111)
Creatinine, Ser: 3.26 mg/dL — ABNORMAL HIGH (ref 0.44–1.00)
GFR, Estimated: 16 mL/min — ABNORMAL LOW (ref >60)
Glucose, Bld: 299 mg/dL — ABNORMAL HIGH (ref 70–99)
Potassium: 4.1 mmol/L (ref 3.5–5.1)
Sodium: 141 mmol/L (ref 135–145)

## 2022-06-26 LAB — MAGNESIUM: Magnesium: 2.7 mg/dL — ABNORMAL HIGH (ref 1.7–2.4)

## 2022-06-26 LAB — CBC
HCT: 25 % — ABNORMAL LOW (ref 36.0–46.0)
Hemoglobin: 8.1 g/dL — ABNORMAL LOW (ref 12.0–15.0)
MCH: 36.7 pg — ABNORMAL HIGH (ref 26.0–34.0)
MCHC: 32.4 g/dL (ref 30.0–36.0)
MCV: 113.1 fL — ABNORMAL HIGH (ref 80.0–100.0)
Platelets: 102 10*3/uL — ABNORMAL LOW (ref 150–400)
RBC: 2.21 MIL/uL — ABNORMAL LOW (ref 3.87–5.11)
RDW: 17.8 % — ABNORMAL HIGH (ref 11.5–15.5)
WBC: 17.5 10*3/uL — ABNORMAL HIGH (ref 4.0–10.5)
nRBC: 3.7 % — ABNORMAL HIGH (ref 0.0–0.2)

## 2022-06-26 LAB — HEPATITIS B SURFACE ANTIGEN: Hepatitis B Surface Ag: NONREACTIVE

## 2022-06-26 LAB — PHOSPHORUS: Phosphorus: 6.6 mg/dL — ABNORMAL HIGH (ref 2.5–4.6)

## 2022-06-26 MED ORDER — PENTAFLUOROPROP-TETRAFLUOROETH EX AERO: 1.0000 | INHALATION_SPRAY | CUTANEOUS | Status: DC | PRN

## 2022-06-26 MED ORDER — FREE WATER
100.0000 mL | Freq: Four times a day (QID) | Status: DC
  Administered 2022-06-26 – 2022-06-29 (×13): 100 mL

## 2022-06-26 MED ORDER — LORAZEPAM 2 MG/ML IJ SOLN
INTRAMUSCULAR | Status: AC
  Administered 2022-06-26: 2 mg via INTRAVENOUS
  Filled 2022-06-26: qty 2

## 2022-06-26 MED ORDER — HEPARIN SODIUM (PORCINE) 1000 UNIT/ML IJ SOLN
INTRAMUSCULAR | Status: AC
  Filled 2022-06-26: qty 4

## 2022-06-26 MED ORDER — MIDAZOLAM HCL 2 MG/2ML IJ SOLN: 2.0000 mg | Freq: Once | INTRAMUSCULAR | Status: AC

## 2022-06-26 MED ORDER — CHLORHEXIDINE GLUCONATE CLOTH 2 % EX PADS
6.0000 | MEDICATED_PAD | Freq: Every day | CUTANEOUS | Status: DC
  Administered 2022-06-26: 6 via TOPICAL

## 2022-06-26 MED ORDER — MIDAZOLAM HCL 2 MG/2ML IJ SOLN
INTRAMUSCULAR | Status: AC
  Administered 2022-06-26: 2 mg via INTRAVENOUS
  Filled 2022-06-26: qty 2

## 2022-06-26 MED ORDER — LACTULOSE 10 GM/15ML PO SOLN
10.0000 g | Freq: Every day | ORAL | Status: DC
  Administered 2022-06-27 – 2022-07-04 (×8): 10 g
  Filled 2022-06-26 (×8): qty 15

## 2022-06-26 MED ORDER — LACTULOSE 10 GM/15ML PO SOLN
10.0000 g | Freq: Once | ORAL | Status: AC
  Administered 2022-06-26: 10 g
  Filled 2022-06-26: qty 15

## 2022-06-26 MED ORDER — LORAZEPAM 2 MG/ML IJ SOLN
2.0000 mg | INTRAMUSCULAR | Status: DC | PRN
  Administered 2022-06-26: 2 mg via INTRAVENOUS

## 2022-06-26 MED ORDER — LEVETIRACETAM IN NACL 500 MG/100ML IV SOLN
500.0000 mg | INTRAVENOUS | Status: AC
  Administered 2022-06-26 (×2): 500 mg via INTRAVENOUS
  Filled 2022-06-26 (×2): qty 100

## 2022-06-26 MED ORDER — HEPARIN SODIUM (PORCINE) 1000 UNIT/ML DIALYSIS: 1000.0000 [IU] | INTRAMUSCULAR | Status: DC | PRN

## 2022-06-26 MED ORDER — LIDOCAINE-PRILOCAINE 2.5-2.5 % EX CREA: 1.0000 | TOPICAL_CREAM | CUTANEOUS | Status: DC | PRN

## 2022-06-26 MED ORDER — ALTEPLASE 2 MG IJ SOLR: 2.0000 mg | Freq: Once | INTRAMUSCULAR | Status: DC | PRN

## 2022-06-26 MED ORDER — INSULIN DETEMIR 100 UNIT/ML ~~LOC~~ SOLN
10.0000 [IU] | Freq: Two times a day (BID) | SUBCUTANEOUS | Status: DC
  Administered 2022-06-26 – 2022-06-27 (×2): 10 [IU] via SUBCUTANEOUS
  Filled 2022-06-26 (×3): qty 0.1

## 2022-06-26 MED ORDER — LORAZEPAM 2 MG/ML IJ SOLN
INTRAMUSCULAR | Status: AC
  Administered 2022-06-26: 2 mg via INTRAVENOUS
  Filled 2022-06-26: qty 1

## 2022-06-26 MED ORDER — LIDOCAINE HCL (PF) 1 % IJ SOLN: 5.0000 mL | INTRAMUSCULAR | Status: DC | PRN

## 2022-06-26 MED ORDER — LEVETIRACETAM IN NACL 500 MG/100ML IV SOLN: 500.0000 mg | INTRAVENOUS | Status: DC

## 2022-06-26 MED ORDER — ANTICOAGULANT SODIUM CITRATE 4% (200MG/5ML) IV SOLN: 5.0000 mL | Status: DC | PRN

## 2022-06-26 NOTE — Progress Notes (Signed)
eLink Physician-Brief Progress Note Patient Name: Deanna Mcmillan DOB: [birth year] MRN: [id number]   Date of Service  06/26/2022  HPI/Events of Note  Prolonged hospital stay following presentation with bacteremia and meningitis.  On scheduled keppra with seizures tonight.  Ativan 2 mg iv given x 3.  Each separated by approx 2-3 min.  Seizure activity ongoing.  Neurology contacted and on way to evaluate the patient.  eICU Interventions  Will need EEG and additional seizure medication     Intervention Category Major Interventions: Seizures - evaluation and management  Mauri Brooklyn, P 06/26/2022, 7:54 PM

## 2022-06-26 NOTE — Progress Notes (Addendum)
eLink Physician-Brief Progress Note Patient Name: Deanna Mcmillan DOB: 1971-05-10 MRN: [id number]   Date of Service  06/26/2022  HPI/Events of Note  RN noticed that patient's belly is a little more distended. Seen on camera. No distress on vent. Was on tube feeds which were just paused while she was being given a bath and have not been resumed. Flexiseal with 400 cc output this night shift. Bowel sounds +. RN feels there may be ascites developing. No vomiting.   eICU Interventions  Will get a KUB RN also sending AM labs      Intervention Category Major Interventions: Other:  Katonya Blecher G Kwame Ryland 06/26/2022, 2:09 AM  Addendum at 2:55 am : KUB does not show any acute pathology. Marland Kitchen Resume feeds.

## 2022-06-26 NOTE — Progress Notes (Signed)
RT NOTE: patient placed on CPAP/PSV of 12/5 at 0755.  Currently tolerating well.  Will continue to monitor.

## 2022-06-26 NOTE — Progress Notes (Signed)
LTM EEG hooked up and running - no initial skin breakdown - push button tested - [facility] monitoring.  

## 2022-06-26 NOTE — Procedures (Signed)
Central Venous Catheter Insertion Procedure Note  Deanna Mcmillan  [id number]  04/29/1971  Date:06/26/22  Time:2:30 PM   Provider Performing:Thomes Burak Allyson Sabal under supervision of Dr. Lynetta Mare  Procedure: Insertion of Non-tunneled Central Venous [phone]) with US guidance (63335)   Indication(s) Hemodialysis  Consent Risks of the procedure as well as the alternatives and risks of each were explained to the patient and/or caregiver.  Consent for the procedure was obtained and is signed in the bedside chart  Anesthesia Topical only with 1% lidocaine   Timeout Verified patient identification, verified procedure, site/side was marked, verified correct patient position, special equipment/implants available, medications/allergies/relevant history reviewed, required imaging and test results available.  Sterile Technique Maximal sterile technique including full sterile barrier drape, hand hygiene, sterile gown, sterile gloves, mask, hair covering, sterile ultrasound probe cover (if used).  Procedure Description Area of catheter insertion was cleaned with chlorhexidine and draped in sterile fashion.  With real-time ultrasound guidance a central venous catheter was placed into the right femoral vein. Nonpulsatile blood flow and easy flushing noted in all ports.  The catheter was sutured in place and sterile dressing applied.  Complications/Tolerance None; patient tolerated the procedure well. Chest X-ray is ordered to verify placement for internal jugular or subclavian cannulation.   Chest x-ray is not ordered for femoral cannulation.  EBL Minimal  Specimen(s) None    Virl Axe, MD 06/26/2022, 2:31 PM

## 2022-06-26 NOTE — Progress Notes (Signed)
   06/26/22 2017  Vitals  Temp 97.8 F (36.6 C)  Temp Source Axillary  BP (!) 98/44  MAP (mmHg) (!) 58  BP Location Left Arm  BP Method Automatic  Patient Position (if appropriate) Lying  Pulse Rate 75  Pulse Rate Source Monitor  ECG Heart Rate (!) 107  Resp (!) 23  Oxygen Therapy  SpO2 98 %  O2 Device Ventilator  Patient Activity (if Appropriate) In bed  Pulse Oximetry Type Continuous  During Treatment Monitoring  Blood Flow Rate (mL/min) 200 mL/min  HD Safety Checks Performed Yes  Intra-Hemodialysis Comments Tx completed  Post Treatment  Dialyzer Clearance Clear  Duration of HD Treatment -hour(s) 2.5 hour(s)  Hemodialysis Intake (mL) 0 mL  Liters Processed 32  Fluid Removed (mL) 500 mL  Tolerated HD Treatment Yes  Post-Hemodialysis Comments hd tx completed.  Hemodialysis Catheter Right Femoral vein  Placement Date: 06/26/22   Orientation: Right  Access Location: Femoral vein  Site Condition No complications  Blue Lumen Status Heparin locked  Red Lumen Status Heparin locked  Purple Lumen Status Flushed  Catheter fill solution Heparin 1000 units/ml  Catheter fill volume (Arterial) 1.4 cc  Catheter fill volume (Venous) 1.4  Dressing Type Transparent  Dressing Status Antimicrobial disc in place  Interventions New dressing  Drainage Description None  Dressing Change Due 06/27/22  Post treatment catheter status Capped and Clamped

## 2022-06-26 NOTE — Progress Notes (Signed)
NEUROLOGY CONSULTATION PROGRESS NOTE   Date of service: June 26, 2022 Patient Name: Deanna Mcmillan MRN:  [id number] DOB:  05/22/1971  Interval Hx   Was notified of persistent seizure like activity despite a total of 6mg  of Ativan. The seizure like activity started towards the end of her first hemodialysis session.   On my evaluation, Deanna Mcmillan has rhythmic jerking and stiffness of LUE and LLE. She is biting down on the ETT and has a left gaze deviation.  Vitals   Vitals:   06/26/22 2008 06/26/22 2017 06/26/22 2031 06/26/22 2033  BP:  (!) 98/44  (!) 78/49  Pulse: (!) 110 75  70  Resp: (!) 27 (!) 23  19  Temp:  97.8 F (36.6 C)  97.8 F (36.6 C)  TempSrc:  Axillary  Axillary  SpO2: 95% 98%  97%  Weight:   78.7 kg      Body mass index is 29.78 kg/m.  Physical Exam   General: Laying comfortably in bed; intubated. HENT: Normal oropharynx and mucosa. Normal external appearance of ears and nose. Neck: Supple, no pain or tenderness  CV: No JVD. No peripheral edema.  Pulmonary: Symmetric Chest rise. Not breathing over vent. Abdomen: Soft to touch, non-tender. Ext: No cyanosis, significant bruising of BL upper extremities along with generalized edema. Skin: No rash. Normal palpation of skin.  Musculoskeletal: Normal digits and nails by inspection. No clubbing.  Neurologic Examination performed about 10 mins after patient got a total of 6 mg of Ativan.  Mental status/Cognition: eyes closed, does not open eyes to loud voice or to tactile stimulation. No response to noxious stimuli Speech/language: obtunded mentation, mute. No attempts to communicate. Cranial nerves:   CN II Pupils equal and reactive to light, unable to assess for VF deficits.   CN III,IV,VI EOM weakly intact to dolls eyes   CN V Corneals absent   CN VII Facial diplegia.   CN VIII Does not turn head towards speech   CN IX & X Weak + cough, unable to get far back in the oropharynx to ilicit a gag.   CN XI  Head midline   CN XII midline tongue but does not protrude on command.   Motor/sensory:  Muscle bulk: hard to assess with significant edema. Tone: flaccid in all extremities. No response to pain in any of the extremities.  Coordination/Complex Motor:  Unable to assess.  Labs   Basic Metabolic Panel:  Lab Results  Component Value Date   NA 141 06/26/2022   K 4.1 06/26/2022   CO2 19 (L) 06/26/2022   GLUCOSE 299 (H) 06/26/2022   BUN 168 (H) 06/26/2022   CREATININE 3.26 (H) 06/26/2022   CALCIUM 8.2 (L) 06/26/2022   GFRNONAA 16 (L) 06/26/2022   GFRAA >90 04/12/2013   HbA1c:  Lab Results  Component Value Date   HGBA1C <4.2 (L) 04/25/2022   LDL:  Lab Results  Component Value Date   LDLCALC NOT CALCULATED 04/25/2022   Urine Drug Screen:     Component Value Date/Time   LABOPIA NONE DETECTED 04/23/2022 0253   COCAINSCRNUR NONE DETECTED 04/23/2022 0253   LABBENZ NONE DETECTED 04/23/2022 0253   AMPHETMU NONE DETECTED 04/23/2022 0253   THCU NONE DETECTED 04/23/2022 0253   LABBARB NONE DETECTED 04/23/2022 0253    Alcohol Level     Component Value Date/Time   ETH <10 04/23/2022 0453   No results found for: "PHENYTOIN", "ZONISAMIDE", "LAMOTRIGINE", "LEVETIRACETA" No results found for: "PHENYTOIN", "PHENOBARB", "VALPROATE", "CBMZ"  Imaging and Diagnostic studies   CSF Opening pressure 11/27 25, closing pressure 19 Opening pressure 11/29 23, closing pressure 12   Latest Reference Range & Units 06/01/22 13:43 06/18/22 11:04  Appearance, CSF CLEAR  CLOUDY ! HAZY !  Glucose, CSF 40 - 70 mg/dL 86 (H) 94 (H) -- serum 193 48% of serum  RBC Count, CSF 0 /cu mm 26,400 (H) 5,000 (H)  WBC, CSF 0 - 5 /cu mm 80 (HH) 191 (HH)  Segmented Neutrophils-CSF 0 - 6 % 37 (H) 3  Lymphs, CSF 40 - 80 % 53 92 (H)  Monocyte-Macrophage-Spinal Fluid 15 - 45 % 9 (L) 5 (L)  Eosinophils, CSF 0 - 1 % 1 0  Color, CSF COLORLESS  RED ! AMBER !  Supernatant   XANTHOCHROMIC XANTHOCHROMIC  Total   Protein, CSF 15 - 45 mg/dL >600 (H) >600 (H)  (HH): Data is critically high; !: Data is abnormal; (H): Data is abnormally high; (L): Data is abnormally low     CSF 11/30: RBC 5725/1800, WBC 61/88 (lymphocytic predominance), xanthochromic with protein of 423 and glucose of 92 (serum 141) CSF elevated IgG 39.2 but IgG index 0.6 which is normal VDRL negative CSF flow and cytopathology were not collected/processed    Autoimmune labs:  Thyroperoxidase Ab SerPl-aCnc 0 - 34 IU/mL 22  Thyroglobulin Antibody 0.0 - 0.9 IU/mL    Anticardiolipin IgM 25, IgG and IgA neg RF neg ANCA neg Anti smooth muscle IgG and mitochondrial antibodies neg ANA neg Anti Jo-1 neg Ribonucleic Protein 0.0 - 0.9 AI 0.3 VC  SSA (Ro) (ENA) Antibody, IgG 0.0 - 0.9 AI <0.2  Scleroderma (Scl-70) (ENA) Antibody, IgG 0.0 - 0.9 AI <0.2  ENA SM Ab Ser-aCnc 0.0 - 0.9 AI <0.2  SSB (La) (ENA) Antibody, IgG 0.0 - 0.9 AI <0.2  ds DNA Ab 0 - 9 IU/mL <1      Additional serum labs TSH: 0.095 (low) fT4 0.72 (wnl) HIV neg  Expand All Collapse All  Neurology Progress Note   Major interval events/Subjective: - Continues with low grade fevers  - Level of consciousness stable per nursing at bedside with no significant fluctuation - No c/f seizure activity     Current Facility-Administered Medications:    0.9 %  sodium chloride infusion, , Intravenous, PRN, Julian Hy, DO, Last Rate: 10 mL/hr at 06/22/22 0400, New Bag at 06/22/22 0400   acetaminophen (TYLENOL) 160 MG/5ML solution 650 mg, 650 mg, Per Tube, Q6H PRN, Judd Lien, MD, 650 mg at 06/21/22 2015   Chlorhexidine Gluconate Cloth 2 % PADS 6 each, 6 each, Topical, Daily, Candee Furbish, MD, 6 each at 06/23/22 2015   feeding supplement (PROSource TF20) liquid 60 mL, 60 mL, Per Tube, Daily, Meuth, Brooke A, PA-C, 60 mL at 06/24/22 0929   feeding supplement (VITAL 1.5 CAL) liquid 1,000 mL, 1,000 mL, Per Tube, Continuous, Meuth, Brooke A, PA-C, Last Rate: 55 mL/hr at  06/24/22 1000, Infusion Verify at 06/24/22 1000   fentaNYL (SUBLIMAZE) injection 50-200 mcg, 50-200 mcg, Intravenous, Q30 min PRN, Chesley Mires, MD, 50 mcg at 27/25/36 6440   folic acid (FOLVITE) tablet 1 mg, 1 mg, Per Tube, Daily, Ventura Sellers, RPH, 1 mg at 06/24/22 0930   free water 200 mL, 200 mL, Per Tube, Q4H, Virl Axe, MD, 200 mL at 06/24/22 1138   heparin injection 5,000 Units, 5,000 Units, Subcutaneous, Q8H, Corey Harold, NP, 5,000 Units at 06/24/22 0557   insulin aspart (novoLOG) injection  0-20 Units, 0-20 Units, Subcutaneous, Q4H, Virl Axe, MD, 11 Units at 06/24/22 1139   insulin aspart (novoLOG) injection 6 Units, 6 Units, Subcutaneous, Q4H, Rigoberto Noel, MD, 6 Units at 06/24/22 1139   [START ON 06/25/2022] insulin detemir (LEVEMIR) injection 10 Units, 10 Units, Subcutaneous, Daily, Rigoberto Noel, MD   lactated ringers infusion, , Intravenous, Continuous, Virl Axe, MD, Stopped at 06/22/22 1239   [START ON 06/25/2022] lactulose (CHRONULAC) 10 GM/15ML solution 20 g, 20 g, Per Tube, Daily, Rigoberto Noel, MD   leptospermum manuka honey (MEDIHONEY) paste 1 Application, 1 Application, Topical, QHS, Rigoberto Noel, MD, 1 Application at 67/59/16 2103   levETIRAcetam (KEPPRA) tablet 500 mg, 500 mg, Per Tube, BID, Ventura Sellers, RPH, 500 mg at 06/24/22 0930   lip balm (CARMEX) ointment, , Topical, PRN, Virl Axe, MD   meropenem (MERREM) 1 g in sodium chloride 0.9 % 100 mL IVPB, 1 g, Intravenous, Q12H, Susa Raring, RPH, Stopped at 06/24/22 0959   [START ON 06/25/2022] methylPREDNISolone sodium succinate (SOLU-MEDROL) 125 mg/2 mL injection 60 mg, 60 mg, Intravenous, Q24H, Rigoberto Noel, MD   midodrine (PROAMATINE) tablet 10 mg, 10 mg, Per Tube, Q8H, Pierce, Dwayne A, RPH, 10 mg at 06/24/22 0557   norepinephrine (LEVOPHED) 16 mg in 287mL (0.064 mg/mL) premix infusion, 0-40 mcg/min, Intravenous, Titrated, Mannam, Praveen, MD, Stopped at 06/24/22 0927   Oral  care mouth rinse, 15 mL, Mouth Rinse, Q2H, Sood, Vineet, MD, 15 mL at 06/24/22 1138   Oral care mouth rinse, 15 mL, Mouth Rinse, PRN, Chesley Mires, MD   pantoprazole (PROTONIX) injection 40 mg, 40 mg, Intravenous, Q24H, Sood, Vineet, MD, 40 mg at 06/23/22 2140   rifaximin (XIFAXAN) tablet 550 mg, 550 mg, Per Tube, BID, Brand Males, MD, 550 mg at 06/24/22 0930   sodium bicarbonate 150 mEq in sterile water 1,150 mL infusion, , Intravenous, Continuous, Rigoberto Noel, MD, Last Rate: 50 mL/hr at 06/24/22 1044, New Bag at 06/24/22 1044   sodium chloride flush (NS) 0.9 % injection 10-40 mL, 10-40 mL, Intracatheter, Q12H, Carlis Abbott, Laura P, DO, 10 mL at 06/24/22 0931   sodium chloride flush (NS) 0.9 % injection 10-40 mL, 10-40 mL, Intracatheter, PRN, Noemi Chapel P, DO   thiamine (VITAMIN B1) tablet 100 mg, 100 mg, Per Tube, Daily, Ventura Sellers, RPH, 100 mg at 06/24/22 3846   white petrolatum (VASELINE) gel, , Topical, PRN, Julian Hy, DO, 1 Application at 65/99/35 0809   Current vital signs: BP (!) 112/48   Pulse 89   Temp 100.3 F (37.9 C) (Oral)   Resp (!) 31   Wt 74.4 kg   SpO2 92%   BMI 28.15 kg/m  Vital signs in last 24 hours: Temp:  [98.8 F (37.1 C)-100.8 F (38.2 C)] 100.3 F (37.9 C) (12/03 1126) Pulse Rate:  [80-100] 89 (12/03 1000) Resp:  [23-38] 31 (12/03 1000) BP: (95-127)/(45-78) 112/48 (12/03 1000) SpO2:  [89 %-98 %] 92 % (12/03 1000) FiO2 (%):  [40 %] 40 % (12/03 1204) Weight:  [74.4 kg] 74.4 kg (12/03 0500)     Physical Exam  Constitutional: Appears chronically ill  Psych: Minimally interactive Eyes: Scleral edema is minimal HENT: ET tube in place MSK: no joint deformities.  Cardiovascular: Mildly tachy Respiratory: Breathing comfortably on the ventilator,  GI: Mildly distended Skin: Scattered bruising throughout, some icterus    Neuro: Mental Status: Keeps eyes open once I open them but does not open eyes to voice today,  blinks spontaneously and  to threat, not clearly orienting to examiner, not following commands  Cranial Nerves: Blinks to threat bilaterally.  Pupils are equal, round, and reactive to light.   VOR equal today Intact cough Motor/Sensory: No spontaneous movement today. Today some slight tremoring of her right foot intermittently after she is awoken. No clear response to noxious stim in any of her extremities Reflexes: 2+ right biceps, brachioradialis. Absent on the left. No patellar reflexes bilaterally. 2+ right ankle jerk, absent on the left. No clonus bilaterally Cerebellar: Unable to assess secondary to patient's mental status      Pertinent Labs:   Basic Metabolic Panel: Last Labs            Recent Labs  Lab 06/19/22 0402 06/19/22 2025 06/21/22 0409 06/22/22 0304 06/22/22 1249 06/23/22 0259 06/23/22 1247 06/24/22 0259  NA 142   < > 150* 153* 149* 147*  --  142  K 3.1*   < > 3.5 3.0* 3.2* 3.0* 3.4* 3.6  CL 102   < > 111 115* 111 115*  --  108  CO2 23   < > 21* 21* 19* 19*  --  16*  GLUCOSE 147*   < > 210* 248* 410* 249*  --  278*  BUN 59*   < > 82* 100* 99* 113*  --  131*  CREATININE 1.83*   < > 1.99* 2.22* 2.27* 2.42*  --  2.70*  CALCIUM 7.3*   < > 8.3* 8.9 9.0 8.7*  --  8.9  MG 2.6*  --   --   --   --   --  2.6* 2.7*  PHOS  --   --   --   --   --   --   --  2.8   < > = values in this interval not displayed.         CBC: Last Labs          Recent Labs  Lab 06/19/22 0402 06/20/22 0352 06/21/22 0409 06/22/22 0304 06/23/22 0259 06/24/22 0259  WBC 15.0* 16.6* 14.0* 22.1* 24.2* 23.6*  NEUTROABS 12.0* 13.2* 12.3* 18.5*  --   --   HGB 8.8* 8.4* 8.2* 8.5* 8.4* 8.3*  HCT 27.8* 26.5* 25.6* 26.2* 25.8* 25.0*  MCV 115.4* 116.2* 114.8* 113.9* 113.7* 112.1*  PLT 242 224 187 158 127* 110*         Coagulation Studies:  Recent Labs (last 2 labs)  No results for input(s): "LABPROT", "INR" in the last 72 hours.    CSF Opening pressure 11/27 25, closing pressure 19 Opening pressure 11/29 23,  closing pressure 12   Latest Reference Range & Units 06/01/22 13:43 06/18/22 11:04  Appearance, CSF CLEAR  CLOUDY ! HAZY !  Glucose, CSF 40 - 70 mg/dL 86 (H) 94 (H) -- serum 193 48% of serum  RBC Count, CSF 0 /cu mm 26,400 (H) 5,000 (H)  WBC, CSF 0 - 5 /cu mm 80 (HH) 191 (HH)  Segmented Neutrophils-CSF 0 - 6 % 37 (H) 3  Lymphs, CSF 40 - 80 % 53 92 (H)  Monocyte-Macrophage-Spinal Fluid 15 - 45 % 9 (L) 5 (L)  Eosinophils, CSF 0 - 1 % 1 0  Color, CSF COLORLESS  RED ! AMBER !  Supernatant   XANTHOCHROMIC XANTHOCHROMIC  Total  Protein, CSF 15 - 45 mg/dL >600 (H) >600 (H)  (HH): Data is critically high; !: Data is abnormal; (H): Data is abnormally high; (L): Data is abnormally low  CSF 11/30: RBC 5725/1800, WBC 61/88 (lymphocytic predominance), xanthochromic with protein of 423 and glucose of 92 (serum 141) CSF elevated IgG 39.2 but IgG index 0.6 which is normal VDRL negative CSF flow and cytopathology were not collected/processed    Autoimmune labs:  Thyroperoxidase Ab SerPl-aCnc 0 - 34 IU/mL 22  Thyroglobulin Antibody 0.0 - 0.9 IU/mL    Anticardiolipin IgM 25, IgG and IgA neg RF neg ANCA neg Anti smooth muscle IgG and mitochondrial antibodies neg ANA neg Anti Jo-1 neg Ribonucleic Protein 0.0 - 0.9 AI 0.3 VC  SSA (Ro) (ENA) Antibody, IgG 0.0 - 0.9 AI <0.2  Scleroderma (Scl-70) (ENA) Antibody, IgG 0.0 - 0.9 AI <0.2  ENA SM Ab Ser-aCnc 0.0 - 0.9 AI <0.2  SSB (La) (ENA) Antibody, IgG 0.0 - 0.9 AI <0.2  ds DNA Ab 0 - 9 IU/mL <1      Additional serum labs TSH: 0.095 (low) fT4 0.72 (wnl) HIV neg   Unresulted Labs (From admission, onward)        Start     Ordered    06/25/22 5701   Basic metabolic panel  Tomorrow morning,   R       Question:  Specimen collection method  Answer:  Unit=Unit collect   06/24/22 1132    06/25/22 0500   Magnesium  Tomorrow morning,   R       Question:  Specimen collection method  Answer:  Unit=Unit collect   06/24/22 1132    06/25/22 0500    Phosphorus  Tomorrow morning,   R       Question:  Specimen collection method  Answer:  Unit=Unit collect   06/24/22 1132    06/23/22 0500   Comprehensive metabolic panel  Daily at 5am,   R     Question:  Specimen collection method  Answer:  Unit=Unit collect   06/22/22 0809    06/23/22 0500   CBC  Daily at 5am,   R     Question:  Specimen collection method  Answer:  Unit=Unit collect   06/22/22 0809    06/20/22 1737   Miscellaneous [facility] test (send-out)  Once,   R        06/20/22 1737    06/20/22 1029   Miscellaneous [facility] test (send-out)  Once,   R       Question:  Test name / description:  Answer:  Autoimmune Encephalitis panel on CSF   06/20/22 1028    06/20/22 1029   Miscellaneous [facility] test (send-out)  Once,   R       Question:  Test name / description:  Answer:  Autoimmune Encephalitis panel on Serum   06/20/22 1028    06/20/22 1029   Oligoclonal bands, CSF + serum  (Oligoclonal Bands, CSF + Serum panel)  Once,   R        06/20/22 1028    06/20/22 1029   Draw extra clot tube  (Oligoclonal Bands, CSF + Serum panel)  Once,   R       Question:  Specimen collection method  Answer:  IV Team=IV Team collect   06/20/22 1028    06/20/22 1029   Draw extra clot tube  (IgG CSF Index, CSF + Serum panel)  Once,   R       Question:  Specimen collection method  Answer:  IV Team=IV Team collect   06/20/22 1028  CSF autoimmune encephalitis panel is pending.   MRI brain w/o contrast 06/17/2022 personally reviewed, agree with radiology:   No acute intracranial pathology or epileptogenic focus identified.    MRI brain and C-spine 06/20/2022 1. Negative for acute infarct or mass. 2. Diffuse pachymeningeal enhancement and mild thickening. Sulcal hyperintensity on FLAIR has progressed in the interval. Mild pituitary enlargement for age unchanged. The mamillopontine distance is narrowed at 4.3 mm. Findings can be seen with intracranial hypotension however some of these  findings can also be seen with meningitis. 3. No evidence of spinal infection in the cervical spine. Cervical spondylosis causing foraminal narrowing as described above.    Impression   Deanna Mcmillan is a 51 y.o. 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 CSF contamination with blood, appreciate ID following), improving  Nonoliguric AKI likely secondary to ATN associated with shock, appreciate nephrology following  Alcoholic cirrhosis vs. Potential for autoimmune component   Ileus/concern for partial small bowel obstruction  Acute respiratory failure due to shock AKI and encephalopathy  Prolonged Qtc  Chronic anemia.  She had prolonged GTC and left gaze deviation towards the end of her firs hemodialysis session today. Suspect that the etiology of seizure was likely keppra removal during HD. However, unclear why the seizure was so resistant and persisted despite 6mg  of Ativan. Seizure spontaneously resolved during my evaluation with no further clinical evidence of seizure. This occurred before she could be loaded with Keppra. We still went ahead to give her a 1 time dose of Keppra. Very well could still be having subclinical seizure.  Recommendations  - Keppra 1000mg  IV once given - continue Keppra 500mg  BID. Will add additional 500mg  IV Keppra to be given after every HD session. - cEEG to evaluate for subclinical seizures vs status. ______________________________________________________________________  Plan discussed in detail with patient's RN, patient's mother at bedside and then later with Dr. Tamala Julian  with the PCCM over phone.   This patient is critically ill and at significant risk of neurological worsening, death and care requires constant monitoring of vital signs, hemodynamics,respiratory and cardiac monitoring, neurological assessment, discussion with family, other specialists and medical decision making of high complexity. I spent 35 minutes of neurocritical care time  in the care of  this patient. This was time spent independent of any time provided by nurse practitioner or PA.  Donnetta Simpers [facility] Pager Number [phone] 06/26/2022  9:59 PM   Thank you for the opportunity to take part in the care of this patient. If you have any further questions, please contact the neurology consultation attending.  Signed,  [place] Pager Number [phone]

## 2022-06-26 NOTE — Progress Notes (Addendum)
NAME:  Deanna Mcmillan, MRN:  [id number], DOB:  03-29-1971, LOS: 15 ADMISSION DATE:  06/06/2022, CONSULTATION DATE:  11/7 REFERRING MD:  Lendon Colonel, EDP CHIEF COMPLAINT:  AMS   History of Present Illness:  51 yo woman with hx of recent diagnosis of alcoholic hepatitis (dx 99/83), here with ams, fever.  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 Seizure-EEG neg Started on pulse steroids per neuro for autoimmune cause    Recently seen at [facility] [address] 10/19: LE Edema. Lasix not helpful at home.  Admitted for diuresis.  Jaundice and abd distension also noted.  Pleural effusion.  GI saw: non enough ascites for tap  Started lasix and spironolactone.  28 day course of prednisolone, miralax, lactulose, miralax. Midodrine TID     Pertinent  Medical History  ETOH Cirrhosis BLE edema recently worsening  S/p lap appy 2014    Echo: recent normal 38/2505   Home meds: folic ascid, acetaminohpen, lasix 40bid, lactulose 30 TID protonix, prednisone (04/29/22), prednisolone 04/28/22 three week course, thiamine, simethicone,    Per her mother she had been drinking at least one bottle of wine daily  Significant Hospital Events: Including procedures, antibiotic start and stop dates in addition to other pertinent events   11/7: Intubated in ED and admitted to ICU 11/7: CTH neg, CT A/P cirrhosis with portal hypertension, splenomegaly, moderate volume ascites, moderate bilateral pleural effusion Blood culture - Listeria and MSSA and MSSE (per mom health dept thinks she got it at [facility] but patient does love cheese) Urine culture - Pan sensitive E colii 11/8 cutlures Trach - MSSA Blood - neg 11/9 KUB: No ileus 11/10: LP confirms listeria meningitis 11/11 KUB: ileus 11/12 ileus resolved with aggressive bowel regimen 11/15 KUB:  No ileus. 11/18 more awake, following commands 1121 RUE PICC 11/22 extubated to BiPAP 11/23 - 14d of Rx complerted for MSSA/MESSE. For listeria - ID wants to extend amp/gent to 3 weeks 11/8-11/29. Ileus post extubation and NG to LIS . Spivey increased. BP sofft an dlevophed and alb Rx.   11/24 -  OG placed yesterday for ileus and bile returns. TF on hold. Lactulose increased yesterday. REamins off vent. Did not need BiPAP. This AM more obtunded but mom thinks was awake at night and is sleeping due to fatigue. Afebrile.  But wBC up 13L. Neg baklance -3L:  OFF PRESSORs = Ammonia up at 72 - [place] mild periphepatic ascites Blood culture  11/25 - Now intubated due to obtunded encephaopathy. CTAbc with SBO v Ileus with transition ponit . CCS consult - no surgery but ok for rectal lactulose w ith continue NG/OG tube to LIS. ID ordered repeat blood culture. Linezolid added.  No ntrition since 06/14/22 Pm. Creat worsening. LEss Ur OP. On levophed 67mcg. Not on sedation. Vent 40% Ammonia 108 Trach aspirate 11/27 - Remains intubated. On propofol 61mcg and levophed 23mcg. Febrile up to 101.63F overnight. On ampicillin and linezolid, going for IR guided LP today. Kidney function improving, repleting potassium. No seizure activity noted overnight on EEG. Surgery following for ileus, no need for surgical intervention. Holding TF for today.  11/29 - started on pulse dose steroids, getting autoimmune workup, CSF studies Interim History / Subjective:   Remains critically ill, intubated. Remains off pressors. Febrile to 102.69F overnight, although leukocytosis improving. On PS 10, PEEP 5 since this AM. Her  kidney function continues to worsen. Discussed with mom and with nephrology, plan for central line placement for 2-week trial of HD to see if patient's neuro exam improves with this.   UA growing budding yeast, will remove foley catheter, place purewick, and perform q4 bladder scans.   Objective   Blood pressure  122/62, pulse 84, temperature 98.4 F (36.9 C), temperature source Oral, resp. rate (!) 26, weight 67.2 kg, SpO2 94 %.    Vent Mode: PSV;CPAP FiO2 (%):  [40 %] 40 % Set Rate:  [20 bmp] 20 bmp Vt Set:  [430 mL] 430 mL PEEP:  [5 cmH20] 5 cmH20 Pressure Support:  [12 cmH20] 12 cmH20 Plateau Pressure:  [15 cmH20-16 cmH20] 16 cmH20   Intake/Output Summary (Last 24 hours) at 06/26/2022 1333 Last data filed at 06/26/2022 1325 Gross per 24 hour  Intake 3554.42 ml  Output 1385 ml  Net 2169.42 ml   Filed Weights   06/24/22 0500 06/25/22 0406 06/26/22 0216  Weight: 74.4 kg 76 kg 67.2 kg   General: critically ill-appearing middle aged female, laying in bed, on mech vent, NAD. Eyes: open spontaneously, PERRL, EOMI, scleral icterus present. CV: tachycardic rate and regular rhythm, no murmurs. Pulm: ventilated breath sounds, CTABL. On PS 10, PEEP 5. GI: soft, moderately distended, normoactive bowel sounds. Skin: warm and dry. Bruising noted again in [place]. Neuro: opens eyes spontaneously, does not orient or track to either side. Does not follow commands. No withdrawal to noxious stimuli in any extremity. Noxious stimuli to shoulders does elicit head turn and slight grimace.    Resolved Hospital Problem list   Hypokalemia Hypophosphatemia Hypernatremia Thrombocytopenia Ileus  Assessment & Plan:   Shock, circulatory versus septic - improved Klebsiella HAP - completed therapy -remains off of pressor support -continue midodrine 10mg  q8h per tube  Toxic metabolic encephalopathy Cirrhosis [MELD-Na 17, Child Pugh Class C] New GTC seizure (11/26) Persistent encephalopathy, may potentially be sequelae of critical illness on top of preexisting liver dysfunction and now renal dysfunction. Steroids have been stopped given lack of clinically meaningful improvement in mental status per neurology. Nephrology has been consulted for severe azotemia, plan to trial HD for 2 weeks to see if neuro exam  improves with metabolic clearance. Per ID, listeria CNS disease remains possible and treatment can be up to 6 weeks, currently on ampicillin. CSF oligoclonal bands negative. Does have moderately distended abdomen, KUB negative overnight. Will obtain U/S to check for ascites. 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 If plan remains to continue full scope of care, will need trach placement this week as she has remained on mechanical ventilation for over 2 weeks. Discussed with mother.  -extubated 11/22, reintubated 11/24 -does not yet meet criteria for SBT/extubation given shock, AKI, and encephalopathy  Listeria meningoencephalitis Listeria bacteremia - completed therapy MSSA bacteremia - completed therapy Unclear picture as listeria should have been treated by now. Per ID, listeria CNS disease can remain and treatment can be up to 6 weeks (currently on week 4 of treatment). Transitioned to ampicillin for continued listeria treatment. -appreciate ID recommendations -finished course of antibiotics for MSSA bacteremia -ampicillin monotherapy  AKI (Baseline 0.6- 0.9)  Severe Azotemia Metabolic acidosis 2/2 acute renal failure  Kidney function has continued to worsen, now with Cr 3.26 and BUN up to 168. Nephrology to plan for 2 week course of IHD to improve uremia and assess if neurological exam improves. Will place non-tunneled central line to allow for IHD. -appreciate nephrology assistance -central line placement for IHD -bicarbonate gtt @75  cc/hr -avoid nephrotoxic medications as able -monitor UOP -trend kidney function  Hyperglycemia,  uncontrolled CBGs 200s overnight. Off of steroids for 2 days now. Obtaining tube feeds for nutrition. -resistant SSI -TF coverage with novolog 6u q4h -increased levemir to 10u BID -goal BG 140-180  Severe physical deconditioning Protein caloric malnutrition, mild She has remained bedbound in the ICU for almost 1 month now, has profound neuromuscular weakness. Anticipate that she will need months of physical therapy IF she has any clinically significant neurological recovery moving forward. -TF at goal -She will need aggressive PT/OT (likely CIR) for strength training.  Prolonged QTc interval  Hx of SVT - monitor - avoid QTc prolonging medications  Anemia - chronic (baseline hgb 8-9 in oct 2023) -trend hgb curve, transfuse if hgb <7 and/or hemodynamic instability   Best Practice (right click and "Reselect all SmartList Selections" daily)   Diet/type: tubefeeds  DVT prophylaxis: prophylactic heparin  GI prophylaxis: PPI Lines: Central line - RUE PICC since 06/12/22; R femoral vein CVC placed 06/26/22 Foley:  removed 06/26/22 Code Status:  full code Last date of multidisciplinary goals of care discussion [updated mother at bedside daily, she is agreeable to tracheostomy]    Virl Axe, MD 06/26/2022, 1:33 PM   LABS   CBC Recent Labs  Lab 06/24/22 0259 06/25/22 0319 06/26/22 0205  HGB 8.3* 7.8* 8.1*  HCT 25.0* 23.3* 25.0*  WBC 23.6* 20.8* 17.5*  PLT 110* 106* 102*    CHEMISTRY Recent Labs  Lab 06/22/22 1249 06/23/22 0259 06/23/22 1247 06/24/22 0259 06/25/22 0319 06/26/22 0205  NA 149* 147*  --  142 138 141  K 3.2* 3.0* 3.4* 3.6 4.1 4.1  CL 111 115*  --  108 100 103  CO2 19* 19*  --  16* 22 19*  GLUCOSE 410* 249*  --  278* 226* 299*  BUN 99* 113*  --  131* 148* 168*  CREATININE 2.27* 2.42*  --  2.70* 2.84* 3.26*  CALCIUM 9.0 8.7*  --  8.9 8.0* 8.2*  MG  --   --  2.6* 2.7* 2.4 2.7*  PHOS  --   --   --  2.8 4.3 6.6*   Estimated Creatinine Clearance:  19 mL/min (A) (by C-G formula based on SCr of 3.26 mg/dL (H)).   LIVER Recent Labs  Lab 06/21/22 0409 06/22/22 0304 06/23/22 0259 06/24/22 0259 06/25/22 0319  AST 47* 67* 80* 97* 98*  ALT 20 21 23 26 30   ALKPHOS 81 89 105 120 105  BILITOT 4.7* 4.0* 3.4* 3.4* 3.4*  PROT 6.3* 6.2* 6.4* 6.1* 5.7*  ALBUMIN 2.8* 2.7* 2.6* 2.6* 2.4*    ENDOCRINE CBG (last 3)  Recent Labs    06/26/22 0319 06/26/22 0747 06/26/22 1108  GLUCAP 248* 197* 174*

## 2022-06-26 NOTE — Progress Notes (Signed)
[facility] Progress Note   Subjective:  Called back to see pt-  her kidney function initially improved but now has worsened again-  UOP has dropped off-  she got steroids and is getting high protein tube feeds so BUN is very high.  CCM has been having discussions with family-  concern that she will not recover to a meaningful QOL.  Mother is distressed-  wants to give her every chance.  Pt opens eyes when I walk in the room-  no commands-  edematous   Deanna Mcmillan is a/an 51 y.o. female with  alcoholic cirrhosis who presents with sepsis now c/b AKI.  Patient has had a fairly prolonged hospital stay.  she came she developed a headache and developed altered mental status.  In the hospital she was found to be septic.  Because of her altered mental status she was intubated at that time.  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 She was started on treatment. 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 Developed an ileus early in the hospitalization but thi resolved. Then she was extubated on 11/22 and was doing okay on nasal cannular and briefly was off norepinephrine. Then around 11/24 she worsened again requiring intubated. Further complications include seizures and increasing pressor requirements now on 21mcg of norepi. UOP has dropped off but still with about 500cc/day. She developed recurrent ileus and now has NG tube in place again. Crt was 0.6 on 11/23 and started rising the next day now up to 2.6.   Intake/Output Summary (Last 24 hours) at 06/26/2022 0735 Last data filed at 06/26/2022 2355 Gross per 24 hour  Intake 3496.38 ml  Output 1560 ml  Net 1936.38 ml    Vitals:  Vitals:   06/26/22 0325 06/26/22 0350 06/26/22 0400 06/26/22 0500  BP:   124/65 114/61  Pulse:  88 90 83  Resp:  (!) 22 18 (!) 22  Temp: 98.6 F (37 C)     TempSrc: Oral     SpO2:  95% 93% 92%  Weight:         Physical Exam:  General adult female in bed intubated-  opens eyes-  no commands HEENT normocephalic atraumatic  Neck normal circumference trachea midline Lungs clear to auscultation anteriorly no air hunger; FIO2 40, PEEP 5  Heart S1S2 no rub Abdomen soft nontender nondistended Extremities pitting edema appreciated Neuro - no continuous sedation currently running    Medications reviewed   Labs:     Latest Ref Rng & Units 06/26/2022    2:05 AM 06/25/2022    3:19 AM 06/24/2022    2:59 AM  BMP  Glucose 70 - 99 mg/dL 299  226  278   BUN 6 - 20 mg/dL 168  148  131   Creatinine 0.44 - 1.00 mg/dL 3.26  2.84  2.70   Sodium 135 - 145 mmol/L 141  138  142   Potassium 3.5 - 5.1 mmol/L 4.1  4.1  3.6   Chloride 98 - 111 mmol/L 103  100  108   CO2 22 - 32 mmol/L 19  22  16    Calcium 8.9 - 10.3 mg/dL 8.2  8.0  8.9      Assessment/Plan:    Non-Oliguric AKI: Likely secondary to ATN associated with shock. Does not appear dehydrated on exam. Foley in place. - Management of shock has not improved her renal function unfortunately-  since making urine not suspicious of obstruction-  urine seemed bland  when checked prior-  will recheck.  Agree that one would not be able to assess her MS with a BUN over 150.  I sat down and talked with the Mother.  Told her my concern that if we started dialysis unsure she would be able to come off and if she does not improve otherwise globally she really would not be a candidate to continue dialysis support-  realistically would be "stuck " in a hospital or LTACH for the rest of her life.  However, I see it from Moms POV-  pt is young and opens her eyes-  she wants to give her every chance so she wont feel guilty in the future.  I offered a 2 week trial of IHD to get her BUN down and to see if she has it in her to improve otherwise.  If her MS does not improve and she cannot wean from vent at that time-  need to reconsider the dialysis.  Mom seems to understand-  I have spoken with CCM resident to place non tunneled HD cath.  One side note,  she may need CRRT given her hypermetabolic state and volume status -  will see what her labs look like after a couple of IHD treatments    AMS/seizures: Using lactulose and rifaximin.  Neurology involved.   Alcoholic cirrhosis: Continue with supportive care per primary team      Listeria bacteremia and meningitis: Infectious disease following.  abx per primary team and ID   Acute hypoxic respiratory failure: Ventilatory support per primary team    Louis Meckel, MD 06/26/2022 7:35 AM

## 2022-06-26 NOTE — Progress Notes (Signed)
    [facility] for Infectious Disease   Reason for visit: Follow up on CNS listeria meningitis.   Interval History: remains on ampicillin, no acute changes  Physical Exam: Constitutional:  Vitals:   06/26/22 1200 06/26/22 1204  BP: 130/64 130/64  Pulse: 89 92  Resp: (!) 23 (!) 23  Temp:    SpO2: 92% 93%    Continue with antibiotics.

## 2022-06-27 ENCOUNTER — Inpatient Hospital Stay (HOSPITAL_COMMUNITY): Payer: 59

## 2022-06-27 DIAGNOSIS — G9340 Encephalopathy, unspecified: Secondary | ICD-10-CM | POA: Diagnosis not present

## 2022-06-27 DIAGNOSIS — R569 Unspecified convulsions: Secondary | ICD-10-CM | POA: Diagnosis not present

## 2022-06-27 DIAGNOSIS — J9601 Acute respiratory failure with hypoxia: Secondary | ICD-10-CM | POA: Diagnosis not present

## 2022-06-27 LAB — CBC
HCT: 24.9 % — ABNORMAL LOW (ref 36.0–46.0)
Hemoglobin: 8.5 g/dL — ABNORMAL LOW (ref 12.0–15.0)
MCH: 37.9 pg — ABNORMAL HIGH (ref 26.0–34.0)
MCHC: 34.1 g/dL (ref 30.0–36.0)
MCV: 111.2 fL — ABNORMAL HIGH (ref 80.0–100.0)
Platelets: 108 10*3/uL — ABNORMAL LOW (ref 150–400)
RBC: 2.24 MIL/uL — ABNORMAL LOW (ref 3.87–5.11)
RDW: 19 % — ABNORMAL HIGH (ref 11.5–15.5)
WBC: 23.4 10*3/uL — ABNORMAL HIGH (ref 4.0–10.5)
nRBC: 2.1 % — ABNORMAL HIGH (ref 0.0–0.2)

## 2022-06-27 LAB — BASIC METABOLIC PANEL
Anion gap: 19 — ABNORMAL HIGH (ref 5–15)
BUN: 116 mg/dL — ABNORMAL HIGH (ref 6–20)
CO2: 20 mmol/L — ABNORMAL LOW (ref 22–32)
Calcium: 9.1 mg/dL (ref 8.9–10.3)
Chloride: 95 mmol/L — ABNORMAL LOW (ref 98–111)
Creatinine, Ser: 2.34 mg/dL — ABNORMAL HIGH (ref 0.44–1.00)
GFR, Estimated: 24 mL/min — ABNORMAL LOW (ref >60)
Glucose, Bld: 211 mg/dL — ABNORMAL HIGH (ref 70–99)
Potassium: 3.7 mmol/L (ref 3.5–5.1)
Sodium: 134 mmol/L — ABNORMAL LOW (ref 135–145)

## 2022-06-27 LAB — MAGNESIUM: Magnesium: 2.2 mg/dL (ref 1.7–2.4)

## 2022-06-27 LAB — CYTOLOGY - NON PAP

## 2022-06-27 LAB — GLUCOSE, CAPILLARY
Glucose-Capillary: 203 mg/dL — ABNORMAL HIGH (ref 70–99)
Glucose-Capillary: 212 mg/dL — ABNORMAL HIGH (ref 70–99)
Glucose-Capillary: 221 mg/dL — ABNORMAL HIGH (ref 70–99)
Glucose-Capillary: 231 mg/dL — ABNORMAL HIGH (ref 70–99)
Glucose-Capillary: 225 mg/dL — ABNORMAL HIGH (ref 70–99)

## 2022-06-27 LAB — PHOSPHORUS: Phosphorus: 6.6 mg/dL — ABNORMAL HIGH (ref 2.5–4.6)

## 2022-06-27 LAB — HEPATITIS B SURFACE ANTIBODY, QUANTITATIVE: Hep B S AB Quant (Post): 42 m[IU]/mL (ref >9.9)

## 2022-06-27 MED ORDER — CHLORHEXIDINE GLUCONATE CLOTH 2 % EX PADS
6.0000 | MEDICATED_PAD | Freq: Every day | CUTANEOUS | Status: DC
  Administered 2022-06-29 – 2022-07-04 (×6): 6 via TOPICAL

## 2022-06-27 MED ORDER — SODIUM CHLORIDE 0.9 % IV SOLN
2.0000 g | Freq: Two times a day (BID) | INTRAVENOUS | Status: DC
  Administered 2022-06-27 – 2022-06-28 (×2): 2 g via INTRAVENOUS
  Filled 2022-06-27 (×2): qty 2000

## 2022-06-27 MED ORDER — INSULIN DETEMIR 100 UNIT/ML ~~LOC~~ SOLN
12.0000 [IU] | Freq: Two times a day (BID) | SUBCUTANEOUS | Status: DC
  Administered 2022-06-27 – 2022-07-01 (×8): 12 [IU] via SUBCUTANEOUS
  Filled 2022-06-27 (×9): qty 0.12

## 2022-06-27 MED ORDER — DARBEPOETIN ALFA 150 MCG/0.3ML IJ SOSY
150.0000 ug | PREFILLED_SYRINGE | INTRAMUSCULAR | Status: DC
  Administered 2022-06-27: 150 ug via SUBCUTANEOUS
  Filled 2022-06-27 (×2): qty 0.3

## 2022-06-27 MED ORDER — INSULIN ASPART 100 UNIT/ML IJ SOLN
8.0000 [IU] | INTRAMUSCULAR | Status: DC
  Administered 2022-06-27 – 2022-07-03 (×34): 8 [IU] via SUBCUTANEOUS

## 2022-06-27 NOTE — Procedures (Deleted)
EEG Procedure CPT/Type of Study: 87564; 24hr EEG with video Referring Provider: Kipp Brood Primary Neurological Diagnosis: AMS  History: This is a 51 yr old patient, undergoing an EEG to evaluate for encephalopathy. Clinical State: disoriented  Technical Description:  The EEG was performed using standard setting per the guidelines of American Clinical Neurophysiology Society (ACNS).  A minimum of 21 electrodes were placed on scalp according to the International 10-20 or/and 10-10 Systems. Supplemental electrodes were placed as needed. Single EKG electrode was also used to detect cardiac arrhythmia. Patient's behavior was continuously recorded on video simultaneously with EEG. A minimum of 16 channels were used for data display. Each epoch of study was reviewed manually daily and as needed using standard referential and bipolar montages. Computerized quantitative EEG analysis (such as compressed spectral array analysis, trending, automated spike & seizure detection) were used as indicated.   Day 1: from 0820 06/26/22 to 0730 06/27/22  EEG Description: Overall Amplitude:Normal Predominant Frequency: The background activity showed posterior dominant alpha, with about 8-9 Hz, that was occasional. There was improving waking background after extubation. Superimposed Frequencies: frequent theta and some beta activity bilaterally The background was symmetric  Background Abnormalities: Generalized slowing; diffuse slowing which improved as sedation was weaned Rhythmic or periodic pattern: No Epileptiform activity: no Electrographic seizures: no Events: no   Breach rhythm: no  Reactivity: Present  Stimulation procedures:  Hyperventilation: not done Photic stimulation: no change  Sleep Background: Stage II  EKG:no significant arrhythmia  Impression: This was an abnormal continuous video EEG due to resolving diffuse background slowing, indicative of a resolving encephalopathy pattern.  There were no epileptiform discharges or seizures.

## 2022-06-27 NOTE — Progress Notes (Signed)
Pharmacy Antibiotic Note  Deanna Mcmillan is a 51 y.o. female admitted on 06/06/2022 with Listeria meningitis. Pharmacy has been consulted for Ampicillin dosing.  Noted AKI now receiving IHD - last session 12/5 (2.5 hr BFR 250), next planned for 12/7. Ampicillin dosing for IHD currently.  Plan: - Continue Ampicillin 2g IV every 12 hours - Intended stop date to complete 6 weeks of treatment 07/11/22 - Continue to monitor HD/CRRT plans, renal recovery, and necessary dose adjustments  Weight: 78.7 kg (173 lb 8 oz)  Temp (24hrs), Avg:98.2 F (36.8 C), Min:97 F (36.1 C), Max:99.1 F (37.3 C)  Recent Labs  Lab 06/23/22 0259 06/24/22 0259 06/25/22 0319 06/26/22 0205 06/27/22 0423  WBC 24.2* 23.6* 20.8* 17.5* 23.4*  CREATININE 2.42* 2.70* 2.84* 3.26* 2.34*     Estimated Creatinine Clearance: 28.5 mL/min (A) (by C-G formula based on SCr of 2.34 mg/dL (H)).    No Known Allergies  Thank you for allowing pharmacy to be a part of this patient's care.  Alycia Rossetti, PharmD, BCPS Infectious Diseases Clinical Pharmacist 06/27/2022 2:27 PM   **Pharmacist phone directory can now be found on [email or web address] ([email or web address]).  Listed under [place].

## 2022-06-27 NOTE — Progress Notes (Signed)
Subjective:  Had first HD last night-  noted to have sz toward the end that was fairly resistant to medical management-  neuro saw-  to adjust keppra dose-  labs reflective of HD-  BUN down to 116-  mother at bedside   Objective Vital signs in last 24 hours: Vitals:   06/27/22 0345 06/27/22 0400 06/27/22 0500 06/27/22 0600  BP:  (!) 110/57 (!) 109/51 (!) 110/55  Pulse: (!) 103 95 78 87  Resp: (!) 25 (!) 24 (!) 29 (!) 24  Temp:  98.1 F (36.7 C) 98.1 F (36.7 C) 98.6 F (37 C)  TempSrc:  Esophageal Esophageal Esophageal  SpO2: 95% 94% 95% 95%  Weight:       Weight change: 12 kg  Intake/Output Summary (Last 24 hours) at 06/27/2022 0723 Last data filed at 06/27/2022 0600 Gross per 24 hour  Intake 2581.78 ml  Output 2167 ml  Net 414.78 ml    Assessment/Plan:      Non-Oliguric AKI: Likely secondary to ATN associated with shock. Does not appear dehydrated on exam. Foley in place. - Management of shock has not improved her renal function unfortunately-  since making urine not suspicious of obstruction-  urine seemed bland when checked prior-  recheck is same.  Agree that one would not be able to assess her MS with a BUN over 150.  I sat down and talked with the Mother.  Told her my concern that if we started dialysis unsure she would be able to come off and if she does not improve otherwise globally she really would not be a candidate to continue dialysis support-  realistically would be "stuck " in a hospital or LTACH for the rest of her life.  However, I see it from Moms POV-  pt is young and opens her eyes-  she wants to give her every chance so she wont feel guilty in the future.  I offered a 2 week trial of IHD to get her BUN down and to see if she has it in her to improve otherwise.  If her MS does not improve and she cannot wean from vent at that time-  need to reconsider the dialysis.  Mom seems to understand-  I have spoken with CCM resident to place non tunneled HD cath.  One side  note, she may need CRRT given her hypermetabolic state and volume status -  will see what her labs look like after a couple of IHD treatments-  so far so good-  plan for another HD on 12/7     AMS/seizures: Using lactulose and rifaximin.  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  abx per primary team and ID   Acute hypoxic respiratory failure: Ventilatory support per primary team  Anemia-  will add ESA and check iron stores    [place]: Basic Metabolic Panel: Recent Labs  Lab 06/25/22 0319 06/26/22 0205 06/27/22 0423  NA 138 141 134*  K 4.1 4.1 3.7  CL 100 103 95*  CO2 22 19* 20*  GLUCOSE 226* 299* 211*  BUN 148* 168* 116*  CREATININE 2.84* 3.26* 2.34*  CALCIUM 8.0* 8.2* 9.1  PHOS 4.3 6.6* 6.6*   Liver Function Tests: Recent Labs  Lab 06/23/22 0259  06/24/22 0259 06/25/22 0319  AST 80* 97* 98*  ALT 23 26 30   ALKPHOS 105 120 105  BILITOT 3.4* 3.4* 3.4*  PROT 6.4* 6.1* 5.7*  ALBUMIN 2.6* 2.6* 2.4*   No results for input(s): "LIPASE", "AMYLASE" in the last 168 hours. Recent Labs  Lab 06/24/22 0259  AMMONIA 37*   CBC: Recent Labs  Lab 06/21/22 0409 06/22/22 0304 06/23/22 0259 06/24/22 0259 06/25/22 0319 06/26/22 0205 06/27/22 0423  WBC 14.0* 22.1* 24.2* 23.6* 20.8* 17.5* 23.4*  NEUTROABS 12.3* 18.5*  --   --   --   --   --   HGB 8.2* 8.5* 8.4* 8.3* 7.8* 8.1* 8.5*  HCT 25.6* 26.2* 25.8* 25.0* 23.3* 25.0* 24.9*  MCV 114.8* 113.9* 113.7* 112.1* 111.5* 113.1* 111.2*  PLT 187 158 127* 110* 106* 102* 108*   Cardiac Enzymes: No results for input(s): "CKTOTAL", "CKMB", "CKMBINDEX", "TROPONINI" in the last 168 hours. CBG: Recent Labs  Lab 06/26/22 1108 06/26/22 1526 06/26/22 1932 06/26/22 2326  06/27/22 0325  GLUCAP 174* 159* 122* 158* 203*    Iron Studies: No results for input(s): "IRON", "TIBC", "TRANSFERRIN", "FERRITIN" in the last 72 hours. Studies/Results: US Abdomen Limited RUQ (LIVER/GB)  Result Date: 06/26/2022 CLINICAL DATA:  Alcoholic hepatitis, cirrhosis, ascites, meningitis, sepsis and respiratory failure. EXAM: ULTRASOUND ABDOMEN LIMITED RIGHT UPPER QUADRANT COMPARISON:  Abdominal ultrasound on 06/14/2022 FINDINGS: Gallbladder: No gallbladder wall thickening or gallstones. Less prominent biliary sludge in the gallbladder lumen. Common bile duct: Diameter: 3 mm Liver: Evidence cirrhotic liver disease with nodular surface contour. No focal masses or evidence of intrahepatic biliary ductal dilatation. Portal vein is patent on color Doppler imaging with normal direction of blood flow towards the liver. Other: Recanalized umbilical vein present likely reflective of underlying portal hypertension. Ascites is seen around the liver. IMPRESSION: Evidence of cirrhosis and portal hypertension with recanalized umbilical vein and ascites. Electronically Signed   By: Aletta Edouard M.D.   On: 06/26/2022 17:05   DG Abd 1 View  Result Date: 06/26/2022 CLINICAL DATA:  Ileus. EXAM: ABDOMEN - 1 VIEW COMPARISON:  06/24/2022. FINDINGS: Examination is limited due to patient's body habitus and technique. The bowel gas pattern is normal. An enteric tube terminates in the distal stomach. No radio-opaque calculi or other significant radiographic abnormality are seen. IMPRESSION: Nonobstructive bowel-gas pattern. Electronically Signed   By: Brett Fairy M.D.   On: 06/26/2022 02:35   Medications: Infusions:  sodium chloride Stopped (06/27/22 0542)   ampicillin (OMNIPEN) IV 300 mL/hr at 06/27/22 0600   anticoagulant sodium citrate     feeding supplement (VITAL 1.5 CAL) 55 mL/hr at 06/27/22 0600   lactated ringers 10 mL/hr at 06/27/22 0600   [START ON 06/28/2022] levETIRAcetam      Scheduled  Medications:  Chlorhexidine Gluconate Cloth  6 each Topical Daily   feeding supplement (PROSource TF20)  60 mL Per Tube Daily   folic acid  1 mg Per Tube Daily   free water  100 mL Per Tube Q6H   heparin  5,000 Units Subcutaneous Q8H   insulin aspart  0-20 Units Subcutaneous Q4H   insulin aspart  6 Units Subcutaneous Q4H   insulin detemir  10 Units Subcutaneous BID   lactulose  10 g Per Tube Daily   leptospermum manuka honey  1 Application Topical QHS   levETIRAcetam  500 mg Per Tube BID   midodrine  10 mg Per Tube Q8H   modafinil  100 mg Per Tube Daily   mouth rinse  15 mL Mouth Rinse Q2H   pantoprazole (PROTONIX) IV  40 mg Intravenous Q24H   rifaximin  550 mg Per Tube BID   sodium chloride flush  10-40 mL Intracatheter Q12H   thiamine  100 mg Per Tube Daily    have reviewed scheduled and prn medications.  Physical Exam: General:  sedated on vent-  not opening eyes today  Heart: tachy Lungs:mostly clear Abdomen: soft, non tender Extremities: pitting edema throughout Dialysis Access: femoral temp HD cath -  placed 12/5    06/27/2022,7:23 AM  LOS: 29 days

## 2022-06-27 NOTE — Progress Notes (Signed)
PHARMACY NOTE:  ANTIMICROBIAL RENAL DOSAGE ADJUSTMENT  Current antimicrobial regimen includes a mismatch between antimicrobial dosage and estimated renal function.  As per policy approved by the Pharmacy & Therapeutics and Medical Executive Committees, the antimicrobial dosage will be adjusted accordingly.  Current antimicrobial dosage:  Ampicillin 2g Q8H   Indication: Listeria Meningitis    Renal Function:  Estimated Creatinine Clearance: 28.5 mL/min (A) (by C-G formula based on SCr of 2.34 mg/dL (H)). [x]      On intermittent HD, scheduled: last run 12/5  []      On CRRT    Antimicrobial dosage has been changed to:  Ampicillin 2g Q12H    Thank you for allowing pharmacy to be a part of this patient's care.  Esmeralda Arthur, PharmD, BCCCP

## 2022-06-27 NOTE — Procedures (Signed)
EEG Procedure CPT/Type of Study: 43329; 2-12hr EEG with video Referring Provider: Agarwala Primary Neurological Diagnosis: seizure  History: This is a 51 yr old patient, undergoing an EEG to evaluate for seizures. Clinical State: disoriented  Technical Description:  The EEG was performed using standard setting per the guidelines of American Clinical Neurophysiology Society (ACNS).  A minimum of 21 electrodes were placed on scalp according to the International 10-20 or/and 10-10 Systems. Supplemental electrodes were placed as needed. Single EKG electrode was also used to detect cardiac arrhythmia. Patient's behavior was continuously recorded on video simultaneously with EEG. A minimum of 16 channels were used for data display. Each epoch of study was reviewed manually daily and as needed using standard referential and bipolar montages. Computerized quantitative EEG analysis (such as compressed spectral array analysis, trending, automated spike & seizure detection) were used as indicated.   Day 1: from 2300 06/26/22 to 0730 06/27/22  EEG Description: Overall Amplitude: Low/suppressed Predominant Frequency: The background activity showed low voltage 1-3Hz  activity rarely and frequent periods of voltage attenuation.  Superimposed Frequencies: none The background was symmetric  Background Abnormalities: Generalized slowing: diffuse low voltage slowing and attenuation as above Rhythmic or periodic pattern: No Epileptiform activity: no Electrographic seizures: no Events: no   Breach rhythm: no  Reactivity: Absent  Stimulation procedures:  Hyperventilation: not done Photic stimulation: no change  Sleep Background: none  EKG: irregular rhythm  Impression: This was an abnormal continuous video EEG due to diffuse slowing with attenuated background, indicative of a severe sedated encephalopathy pattern. No seizures or epileptiform discharges were seen.

## 2022-06-27 NOTE — Progress Notes (Signed)
Inpatient Diabetes Program Recommendations  AACE/ADA: New Consensus Statement on Inpatient Glycemic Control (2015)  Target Ranges:  Prepandial:   less than 140 mg/dL      Peak postprandial:   less than 180 mg/dL (1-2 hours)      Critically ill patients:  140 - 180 mg/dL   Lab Results  Component Value Date   GLUCAP 212 (H) 06/27/2022   HGBA1C <4.2 (L) 04/25/2022    Review of Glycemic Control  Latest Reference Range & Units 06/26/22 19:32 06/26/22 23:26 06/27/22 03:25 06/27/22 07:42  Glucose-Capillary 70 - 99 mg/dL 122 (H) 158 (H) 203 (H) 212 (H)  Diabetes history: No DM noted Outpatient Diabetes medications: none Current orders for Inpatient glycemic control: Levemir 10 units q 12 hours, Novolog 6 units q4H, Novolog 0-20 units Q4H Vital @55  ml/hr  Inpatient Diabetes Program Recommendations:    Consider increasing Levemir to 12 units bid and increase Novolog tube feed coverage to 8 units q 4 hours.   Thanks,  Adah Perl, RN, BC-ADM Inpatient Diabetes Coordinator Pager [phone]  (8a-5p)

## 2022-06-27 NOTE — Progress Notes (Signed)
NEUROLOGY CONSULTATION PROGRESS NOTE   Date of service: June 27, 2022 Patient Name: Deanna Mcmillan MRN:  [id number] DOB:  21-Aug-1970  Interval Hx   No further clinical seizure activity  No electrographic seizures on EEG overnight  Vitals   Vitals:   06/27/22 1400 06/27/22 1430 06/27/22 1500 06/27/22 1541  BP: (!) 110/56 111/61 111/61   Pulse: 96 94 95 97  Resp: (!) 26 (!) 30 (!) 27 (!) 30  Temp: 100.2 F (37.9 C) 100.2 F (37.9 C) (!) 100.4 F (38 C)   TempSrc:      SpO2: 95% 95% 90% 95%  Weight:         Body mass index is 29.78 kg/m.  Physical Exam   General: Laying comfortably in bed; intubated. HENT: Normal oropharynx and mucosa. Normal external appearance of ears and nose. Neck: Supple, no pain or tenderness  CV: No JVD. No peripheral edema.  Pulmonary: Symmetric Chest rise. Not breathing over vent. Abdomen: Soft to touch, non-tender. Ext: No cyanosis, significant bruising of BL upper extremities along with generalized edema. Skin: No rash. Normal palpation of skin.  Musculoskeletal: Normal digits and nails by inspection. No clubbing.   Mental status/Cognition: eyes closed, does not open eyes to loud voice or to tactile stimulation. No response to noxious stimuli Speech/language: obtunded mentation, mute. No attempts to communicate. Cranial nerves:   CN II Pupils equal and reactive to light, unable to assess for VF deficits.   CN III,IV,VI EOM weakly intact to dolls eyes   CN V Corneals absent   CN VII Facial diplegia.   CN VIII Does not turn head towards speech   CN IX & X Weak + cough, unable to get far back in the oropharynx to ilicit a gag.   CN XI Head midline   CN XII midline tongue but does not protrude on command.   Motor/sensory:  Muscle bulk: hard to assess with significant edema. Tone: flaccid in all extremities. No response to pain in any of the extremities.  Coordination/Complex Motor:  Unable to assess.  Labs   Basic Metabolic  Panel:  Lab Results  Component Value Date   NA 134 (L) 06/27/2022   K 3.7 06/27/2022   CO2 20 (L) 06/27/2022   GLUCOSE 211 (H) 06/27/2022   BUN 116 (H) 06/27/2022   CREATININE 2.34 (H) 06/27/2022   CALCIUM 9.1 06/27/2022   GFRNONAA 24 (L) 06/27/2022   GFRAA >90 04/12/2013   HbA1c:  Lab Results  Component Value Date   HGBA1C <4.2 (L) 04/25/2022   LDL:  Lab Results  Component Value Date   LDLCALC NOT CALCULATED 04/25/2022   Urine Drug Screen:     Component Value Date/Time   LABOPIA NONE DETECTED 04/23/2022 0253   COCAINSCRNUR NONE DETECTED 04/23/2022 0253   LABBENZ NONE DETECTED 04/23/2022 0253   AMPHETMU NONE DETECTED 04/23/2022 0253   THCU NONE DETECTED 04/23/2022 0253   LABBARB NONE DETECTED 04/23/2022 0253    Alcohol Level     Component Value Date/Time   ETH <10 04/23/2022 0453   No results found for: "PHENYTOIN", "ZONISAMIDE", "LAMOTRIGINE", "LEVETIRACETA" No results found for: "PHENYTOIN", "PHENOBARB", "VALPROATE", "CBMZ"  Imaging and Diagnostic studies   CSF Opening pressure 11/27 25, closing pressure 19 Opening pressure 11/29 23, closing pressure 12   Latest Reference Range & Units 06/01/22 13:43 06/18/22 11:04  Appearance, CSF CLEAR  CLOUDY ! HAZY !  Glucose, CSF 40 - 70 mg/dL 86 (H) 94 (H) -- serum 193 48% of  serum  RBC Count, CSF 0 /cu mm 26,400 (H) 5,000 (H)  WBC, CSF 0 - 5 /cu mm 80 (HH) 191 (HH)  Segmented Neutrophils-CSF 0 - 6 % 37 (H) 3  Lymphs, CSF 40 - 80 % 53 92 (H)  Monocyte-Macrophage-Spinal Fluid 15 - 45 % 9 (L) 5 (L)  Eosinophils, CSF 0 - 1 % 1 0  Color, CSF COLORLESS  RED ! AMBER !  Supernatant   XANTHOCHROMIC XANTHOCHROMIC  Total  Protein, CSF 15 - 45 mg/dL >600 (H) >600 (H)  (HH): Data is critically high; !: Data is abnormal; (H): Data is abnormally high; (L): Data is abnormally low     CSF 11/30: RBC 5725/1800, WBC 61/88 (lymphocytic predominance), xanthochromic with protein of 423 and glucose of 92 (serum 141) CSF elevated IgG  39.2 but IgG index 0.6 which is normal VDRL negative CSF flow and cytopathology were not collected/processed    Autoimmune labs:  Thyroperoxidase Ab SerPl-aCnc 0 - 34 IU/mL 22  Thyroglobulin Antibody 0.0 - 0.9 IU/mL    Anticardiolipin IgM 25, IgG and IgA neg RF neg ANCA neg Anti smooth muscle IgG and mitochondrial antibodies neg ANA neg Anti Jo-1 neg Ribonucleic Protein 0.0 - 0.9 AI 0.3 VC  SSA (Ro) (ENA) Antibody, IgG 0.0 - 0.9 AI <0.2  Scleroderma (Scl-70) (ENA) Antibody, IgG 0.0 - 0.9 AI <0.2  ENA SM Ab Ser-aCnc 0.0 - 0.9 AI <0.2  SSB (La) (ENA) Antibody, IgG 0.0 - 0.9 AI <0.2  ds DNA Ab 0 - 9 IU/mL <1      Additional serum labs TSH: 0.095 (low) fT4 0.72 (wnl) HIV neg   Unresulted Labs (From admission, onward)        Start     Ordered    06/25/22 1093   Basic metabolic panel  Tomorrow morning,   R       Question:  Specimen collection method  Answer:  Unit=Unit collect   06/24/22 1132    06/25/22 0500   Magnesium  Tomorrow morning,   R       Question:  Specimen collection method  Answer:  Unit=Unit collect   06/24/22 1132    06/25/22 0500   Phosphorus  Tomorrow morning,   R       Question:  Specimen collection method  Answer:  Unit=Unit collect   06/24/22 1132    06/23/22 0500   Comprehensive metabolic panel  Daily at 5am,   R     Question:  Specimen collection method  Answer:  Unit=Unit collect   06/22/22 0809    06/23/22 0500   CBC  Daily at 5am,   R     Question:  Specimen collection method  Answer:  Unit=Unit collect   06/22/22 0809    06/20/22 1737   Miscellaneous [facility] test (send-out)  Once,   R        06/20/22 1737    06/20/22 1029   Miscellaneous [facility] test (send-out)  Once,   R       Question:  Test name / description:  Answer:  Autoimmune Encephalitis panel on CSF   06/20/22 1028    06/20/22 1029   Miscellaneous [facility] test (send-out)  Once,   R       Question:  Test name / description:  Answer:  Autoimmune Encephalitis panel on Serum   06/20/22 1028     06/20/22 1029   Oligoclonal bands, CSF + serum  (Oligoclonal Bands, CSF + Serum panel)  Once,  R        06/20/22 1028    06/20/22 1029   Draw extra clot tube  (Oligoclonal Bands, CSF + Serum panel)  Once,   R       Question:  Specimen collection method  Answer:  IV Team=IV Team collect   06/20/22 1028    06/20/22 1029   Draw extra clot tube  (IgG CSF Index, CSF + Serum panel)  Once,   R       Question:  Specimen collection method  Answer:  IV Team=IV Team collect   06/20/22 1028                    MRI brain w/o contrast 06/17/2022 personally reviewed, agree with radiology:   No acute intracranial pathology or epileptogenic focus identified.    MRI brain and C-spine 06/20/2022 1. Negative for acute infarct or mass. 2. Diffuse pachymeningeal enhancement and mild thickening. Sulcal hyperintensity on FLAIR has progressed in the interval. Mild pituitary enlargement for age unchanged. The mamillopontine distance is narrowed at 4.3 mm. Findings can be seen with intracranial hypotension however some of these findings can also be seen with meningitis. 3. No evidence of spinal infection in the cervical spine. Cervical spondylosis causing foraminal narrowing as described above.   CT abdomen pelvis w/o contrast 06/15/22 Multiple dilated loops of small bowel with a transition zone in the mid to distal ileum consistent with a least partial small bowel obstruction. Bibasilar consolidation with left-sided pleural effusions stable from the prior CT. Cirrhotic changes of the liver with ascites. Ascites may also be in part due to the underlying obstructive change.     CT chest 11/30 a.m. formal read pending but on personal review with CCM team there is some concern for pneumonia  Impression   Deanna Mcmillan is a 51 y.o. 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 CSF contamination with blood, appreciate ID following), improving  Nonoliguric AKI likely secondary to ATN associated with shock, appreciate nephrology following  Alcoholic cirrhosis vs. Potential for autoimmune component   Ileus/concern for partial small bowel obstruction  Acute respiratory failure due to shock AKI and encephalopathy  Prolonged Qtc  Chronic anemia.  She had prolonged GTC and left gaze deviation towards the end of her first hemodialysis session 12/5. Suspect that the etiology of seizure was likely keppra removal during HD. However, unclear why the seizure was so resistant and persisted despite 6mg  of Ativan. Seizure spontaneously resolved during my evaluation with no further clinical evidence of seizure. EEG overnight no electrographic seizures  Recommendations   - Keppra 1000mg  IV once given - continue Keppra 500mg  BID. Will add additional 500mg  IV Keppra to be given after every HD session. - cEEG to evaluate for subclinical seizures vs status. Can d/c tmrw if no electrographic seizures overnight  This patient is critically ill and at significant risk of neurological worsening, death and care requires constant monitoring of vital signs, hemodynamics,respiratory and cardiac monitoring, neurological assessment, discussion with family, other specialists and medical decision making of high complexity. I spent 45 minutes of neurocritical care time  in the care of  this patient. This was time spent independent of any time provided by nurse practitioner or  PA.  Su Monks, MD [facility] [phone]  If 7pm- 7am, please page neurology on call as listed in [place].

## 2022-06-27 NOTE — Progress Notes (Signed)
Deanna Mcmillan for Infectious Disease   Reason for visit: follow up on meningitis  Interval History: seizure activity overnight. WBC 23.4.   Tmax 100.4.   Day 30 total antibiotics  Physical Exam: Constitutional:  Vitals:   06/27/22 1500 06/27/22 1541  BP: 111/61   Pulse: 95 97  Resp: (!) 27 (!) 30  Temp: (!) 100.4 F (38 C)   SpO2: 90% 95%  Not alert Respiratory: respiratory effort on vent GI: soft, nd  Review of Systems: Unable to assess due to patient factors  Lab Results  Component Value Date   WBC 23.4 (H) 06/27/2022   HGB 8.5 (L) 06/27/2022   HCT 24.9 (L) 06/27/2022   MCV 111.2 (H) 06/27/2022   PLT 108 (L) 06/27/2022    Lab Results  Component Value Date   CREATININE 2.34 (H) 06/27/2022   BUN 116 (H) 06/27/2022   NA 134 (L) 06/27/2022   K 3.7 06/27/2022   CL 95 (L) 06/27/2022   CO2 20 (L) 06/27/2022    Lab Results  Component Value Date   ALT 30 06/25/2022   AST 98 (H) 06/25/2022   ALKPHOS 105 06/25/2022     Microbiology: Recent Results (from the past 240 hour(s))  CSF culture w Gram Stain     Status: None   Collection Time: 06/18/22 11:04 AM   Specimen: PATH Cytology CSF; Cerebrospinal Fluid  Result Value Ref Range Status   Specimen Description CSF  Final   Special Requests NONE  Final   Gram Stain   Final    WBC PRESENT, PREDOMINANTLY MONONUCLEAR NO ORGANISMS SEEN CYTOSPIN SMEAR    Culture   Final    NO GROWTH 3 DAYS Performed at [facility], [place] [address] [place], [place] [address]    Report Status 06/21/2022 FINAL  Final  CSF culture w Gram Stain     Status: None   Collection Time: 06/20/22  1:55 PM   Specimen: PATH Cytology CSF; Cerebrospinal Fluid  Result Value Ref Range Status   Specimen Description CSF  Final   Special Requests NONE  Final   Gram Stain NO WBC SEEN NO ORGANISMS SEEN   Final   Culture   Final    NO GROWTH 3 DAYS Performed at [facility], [place] [address], [place], [place] [address]    Report  Status 06/23/2022 FINAL  Final  Fungus Culture With Stain     Status: None (Preliminary result)   Collection Time: 06/20/22  1:55 PM   Specimen: PATH Cytology CSF; Cerebrospinal Fluid  Result Value Ref Range Status   Fungus Stain Final report  Final    Comment: (NOTE) Performed At: [facility] [place], [place] [id number] Rush Farmer MD FU:[id number]    Fungus (Mycology) Culture PENDING  Incomplete   Fungal Source CSF  Final    Comment: Performed at [facility], [place] [address] [place], [place] [address]  Anaerobic culture w Gram Stain     Status: None   Collection Time: 06/20/22  1:55 PM   Specimen: PATH Cytology CSF; Cerebrospinal Fluid  Result Value Ref Range Status   Specimen Description CSF  Final   Special Requests NONE  Final   Gram Stain NO WBC SEEN NO ORGANISMS SEEN   Final   Culture   Final    NO ANAEROBES ISOLATED Performed at [facility], [place] [address] [place], [place] [address]    Report Status 06/25/2022 FINAL  Final  Fungus Culture Result     Status: None   Collection Time: 06/20/22  1:55 PM  Result Value Ref Range Status   Result 1 Comment  Final    Comment: (NOTE) KOH/Calcofluor preparation:  no fungus observed. Performed At: [facility] [place], [place] [id number] Rush Farmer MD LZ:[id number]     Impression/Plan:  1. Listeria meningoencephalitis, possible - she has had a positive blood cultures in 2 sets in all 4 bottles positive for listeria now on treatment for a bit over 4 weeks.  Most recent MRI on 11/28 with notable diffuse parenchymal enhancement, progressed from the non-contrast MRI on 11/26.   At this point, from and ID standpoint, I favor continuing with ampicillin for treatment to complete 6 weeks for CNS-related infection with a stop date of 12/17.    I will otherwise sign off, call with any questions  I have personally spent 50 minutes involved in face-to-face and  non-face-to-face activities for this patient on the day of the visit. Professional time spent includes the following activities: Preparing to see the patient (review of tests), Obtaining and/or reviewing separately obtained history (admission/discharge record), Performing a medically appropriate examination and/or evaluation , Ordering medications/tests/procedures, referring and communicating with other health care professionals, Documenting clinical information in the EMR, Independently interpreting results (not separately reported), Communicating results to the patient/family/caregiver, Counseling and educating the patient/family/caregiver and Care coordination (not separately reported).

## 2022-06-27 NOTE — Progress Notes (Signed)
NAME:  Deanna Mcmillan, MRN:  [id number], DOB:  November 19, 1970, LOS: 12 ADMISSION DATE:  06/06/2022, CONSULTATION DATE:  11/7 REFERRING MD:  Lendon Colonel, EDP CHIEF COMPLAINT:  AMS   History of Present Illness:  51 yo woman with hx of recent diagnosis of alcoholic hepatitis (dx 65/99), here with ams, fever.  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 Seizure-EEG neg Started on pulse steroids per neuro for autoimmune cause    Recently seen at [facility] 10/19: LE Edema. Lasix not helpful at home.  Admitted for diuresis.  Jaundice and abd distension also noted.  Pleural effusion.  GI saw: non enough ascites for tap  Started lasix and spironolactone.  28 day course of prednisolone, miralax, lactulose, miralax. Midodrine TID     Pertinent  Medical History  ETOH Cirrhosis BLE edema recently worsening  S/p lap appy 2014    Echo: recent normal 35/7017   Home meds: folic ascid, acetaminohpen, lasix 40bid, lactulose 30 TID protonix, prednisone (04/29/22), prednisolone 04/28/22 three week course, thiamine, simethicone,    Per her mother she had been drinking at least one bottle of wine daily  Significant Hospital Events: Including procedures, antibiotic start and stop dates in addition to other pertinent events   11/7: Intubated in ED and admitted to ICU 11/7: CTH neg, CT A/P cirrhosis with portal hypertension, splenomegaly, moderate volume ascites, moderate bilateral pleural effusion Blood culture - Listeria and MSSA and MSSE (per mom health dept thinks she got it at [facility] but patient does love cheese) Urine culture - Pan sensitive E colii 11/8 cutlures Trach - MSSA Blood - neg 11/9 KUB: No ileus 11/10: LP confirms listeria meningitis 11/11 KUB: ileus 11/12 ileus resolved with aggressive bowel regimen 11/15 KUB:  No ileus. 11/18 more awake, following commands 1121 RUE PICC 11/22 extubated to BiPAP 11/23 - 14d of Rx complerted for MSSA/MESSE. For listeria - ID wants to extend amp/gent to 3 weeks 11/8-11/29. Ileus post extubation and NG to LIS . [place] increased. BP sofft an dlevophed and alb Rx.   11/24 -  OG placed yesterday for ileus and bile returns. TF on hold. Lactulose increased yesterday. REamins off vent. Did not need BiPAP. This AM more obtunded but mom thinks was awake at night and is sleeping due to fatigue. Afebrile.  But wBC up 13L. Neg baklance -3L:  OFF PRESSORs = Ammonia up at 72 - [place] mild periphepatic ascites Blood culture  11/25 - Now intubated due to obtunded encephaopathy. CTAbc with SBO v Ileus with transition ponit . CCS consult - no surgery but ok for rectal lactulose w ith continue NG/OG tube to LIS. ID ordered repeat blood culture. Linezolid added.  No ntrition since 06/14/22 Pm. Creat worsening. LEss Ur OP. On levophed 26mcg. Not on sedation. Vent 40% Ammonia 108 Trach aspirate 11/27 - Remains intubated. On propofol 21mcg and levophed 47mcg. Febrile up to 101.72F overnight. On ampicillin and linezolid, going for IR guided LP today. Kidney function improving, repleting potassium. No seizure activity noted overnight on EEG. Surgery following for ileus, no need for surgical intervention. Holding TF for today.  11/29 - started on pulse dose steroids, getting autoimmune workup, CSF studies Interim History / Subjective:   Had seizure activity (rhythmic jerking and stiffness of LUE and LLE; left gaze deviation) near the end of dialysis session yesterday, difficult to  abate even after receiving 6mg  of IV ativan. Neurology evaluated, spontaneously resolved during their evaluation. Thought to be due to keppra elimination with HD.   Otherwise, afebrile overnight and MAPs remained in 70s-90s (slight hypotension with HD, but resolved without needing pressor support). Uremia improved with HD,  although BUN still in 100s.   Ascites noted on ultrasound.  Objective   Blood pressure (!) 120/48, pulse 100, temperature 99.1 F (37.3 C), resp. rate (!) 29, weight 78.7 kg, SpO2 95 %.    Vent Mode: PRVC FiO2 (%):  [40 %] 40 % Set Rate:  [20 bmp] 20 bmp Vt Set:  [430 mL] 430 mL PEEP:  [5 cmH20] 5 cmH20 Plateau Pressure:  [14 cmH20-20 cmH20] 20 cmH20   Intake/Output Summary (Last 24 hours) at 06/27/2022 1404 Last data filed at 06/27/2022 1336 Gross per 24 hour  Intake 2238.78 ml  Output 2047 ml  Net 191.78 ml   Filed Weights   06/26/22 0216 06/26/22 1715 06/26/22 2031  Weight: 67.2 kg 79.2 kg 78.7 kg   General: critically ill-appearing middle aged female, laying in bed, on mech vent, NAD. Head: [place]/AT, cEEG leads in place. Eyes: PERRL, scleral icterus present, EOMI.  CV: tachycardic rate and regular rhythm, no murmurs. Pulm: CTABL, on full ventilatory support.  GI: soft, moderately distended, normoactive bowel sounds. MSK: anasarca present Skin: warm and dry. Bruising in UE, noted since ICU admission. Neuro: Not following commands, no withdrawal to noxious stimuli in any extremity.   Resolved Hospital Problem list   Hypokalemia Hypophosphatemia Hypernatremia Thrombocytopenia Ileus Shock MSSA bacteremia Klebsiella HAP  Assessment & Plan:   Shock, circulatory versus septic - improved Klebsiella HAP - completed therapy -remains off of pressor support -continue midodrine 10mg  q8h per tube  Toxic metabolic encephalopathy Cirrhosis with ascites [MELD-Na 17, Child Pugh Class C] GTC seizures (11/26, 12/5) Uremia Persistent encephalopathy, may potentially be sequelae of critical illness on top of preexisting liver dysfunction and now renal dysfunction. Per ID, listeria CNS disease remains possible and treatment can be up to 6 weeks, currently on ampicillin. Received first session of dialysis yesterday, no fluid removed. Uremia improved, but still in 100s. Ultrasound with  ascites noted. Will see if ascites improves with dialysis tomorrow (with volume removal). If no improvement, may need repeat paracentesis. Long term EEG in place given repeat GTC seizure.  -appreciate neurology and ID assistance -lactulose daily along with rifaximin BID -ampicillin for possible listeria meningoencephalitis  -continue keppra 500mg  BID, additional 500mg  after HD -cEEG -modafinil 100mg  -volume removal with HD (if ascites remains, may need repeat paracentesis) -dialysis per nephro -negative CSF cytology -f/u remaining autoimmune workup   Acute hypoxic respiratory failure  Given lack of ability to wean, anticipate that patient may need ventilatory support for weeks to months. If plan remains to continue full scope of care, will need trach placement this week as she has remained on mechanical ventilation for over 2 weeks. Discussed with mother.  -extubated 11/22, reintubated 11/24 -does not yet meet criteria for SBT/extubation given shock, AKI, and encephalopathy  Listeria meningoencephalitis Listeria bacteremia - completed therapy MSSA bacteremia - completed therapy Unclear picture as listeria should have been treated by now. Per ID, listeria CNS disease can remain and treatment can be up to 6 weeks (currently on week 4 of treatment). Transitioned to ampicillin for continued listeria treatment. -appreciate ID recommendations -finished course of antibiotics for MSSA bacteremia -ampicillin monotherapy  AKI (Baseline 0.6- 0.9)  Severe Azotemia Metabolic acidosis 2/2 acute renal failure Anasarca  Received HD yesterday with improvement in uremia, although BUN remains in 110s. Plan for 2 week course of iHD to improve uremia and assess if neurological exam improves. Also, to help with volume removal given anasarca. Next HD session tomorrow. -appreciate nephrology assistance -HD tomorrow -avoid nephrotoxic meds as able -monitor UOP -trend kidney function  Hyperglycemia,  uncontrolled CBGs remaining in 200s overnight. Unclear as to why she is needing such high insulin requirements despite stoppage of steroids and consistent TF rates. -resistant SSI -increased TF coverage to 8u q4h -increased levemir to 12u BID -goal BG 140-180  Severe physical deconditioning Protein caloric malnutrition, mild She has remained bedbound in the ICU for almost 1 month now, has profound neuromuscular weakness. Anticipate that she will need months of physical therapy IF she has any clinically significant neurological recovery moving forward. -TF at goal -She will need aggressive PT/OT (likely CIR) for strength training.  Prolonged QTc interval  Hx of SVT - monitor - avoid QTc prolonging medications  Anemia - chronic (baseline hgb 8-9 in oct 2023) -trend hgb curve, transfuse if hgb <7 and/or hemodynamic instability   Best Practice (right click and "Reselect all SmartList Selections" daily)   Diet/type: tubefeeds  DVT prophylaxis: prophylactic heparin  GI prophylaxis: PPI Lines: Central line - RUE PICC since 06/12/22; R femoral vein CVC placed 06/26/22 Foley:  removed 06/26/22 Code Status:  full code Last date of multidisciplinary goals of care discussion [updated mother at bedside daily, she is agreeable to tracheostomy]    Virl Axe, MD 06/27/2022, 2:04 PM   LABS   CBC Recent Labs  Lab 06/25/22 0319 06/26/22 0205 06/27/22 0423  HGB 7.8* 8.1* 8.5*  HCT 23.3* 25.0* 24.9*  WBC 20.8* 17.5* 23.4*  PLT 106* 102* 108*    CHEMISTRY Recent Labs  Lab 06/23/22 0259 06/23/22 1247 06/24/22 0259 06/25/22 0319 06/26/22 0205 06/27/22 0423  NA 147*  --  142 138 141 134*  K 3.0* 3.4* 3.6 4.1 4.1 3.7  CL 115*  --  108 100 103 95*  CO2 19*  --  16* 22 19* 20*  GLUCOSE 249*  --  278* 226* 299* 211*  BUN 113*  --  131* 148* 168* 116*  CREATININE 2.42*  --  2.70* 2.84* 3.26* 2.34*  CALCIUM 8.7*  --  8.9 8.0* 8.2* 9.1  MG  --  2.6* 2.7* 2.4 2.7* 2.2  PHOS  --    --  2.8 4.3 6.6* 6.6*   Estimated Creatinine Clearance: 28.5 mL/min (A) (by C-G formula based on SCr of 2.34 mg/dL (H)).   LIVER Recent Labs  Lab 06/21/22 0409 06/22/22 0304 06/23/22 0259 06/24/22 0259 06/25/22 0319  AST 47* 67* 80* 97* 98*  ALT 20 21 23 26 30   ALKPHOS 81 89 105 120 105  BILITOT 4.7* 4.0* 3.4* 3.4* 3.4*  PROT 6.3* 6.2* 6.4* 6.1* 5.7*  ALBUMIN 2.8* 2.7* 2.6* 2.6* 2.4*    ENDOCRINE CBG (last 3)  Recent Labs    06/27/22 0325 06/27/22 0742 06/27/22 1153  GLUCAP 203* 212* 221*

## 2022-06-28 ENCOUNTER — Inpatient Hospital Stay (HOSPITAL_COMMUNITY): Payer: 59

## 2022-06-28 DIAGNOSIS — Z515 Encounter for palliative care: Secondary | ICD-10-CM | POA: Diagnosis not present

## 2022-06-28 DIAGNOSIS — J9601 Acute respiratory failure with hypoxia: Secondary | ICD-10-CM | POA: Diagnosis not present

## 2022-06-28 DIAGNOSIS — Z7189 Other specified counseling: Secondary | ICD-10-CM | POA: Diagnosis not present

## 2022-06-28 DIAGNOSIS — A419 Sepsis, unspecified organism: Secondary | ICD-10-CM | POA: Diagnosis not present

## 2022-06-28 DIAGNOSIS — Z66 Do not resuscitate: Secondary | ICD-10-CM

## 2022-06-28 DIAGNOSIS — G9340 Encephalopathy, unspecified: Secondary | ICD-10-CM | POA: Diagnosis not present

## 2022-06-28 DIAGNOSIS — G40901 Epilepsy, unspecified, not intractable, with status epilepticus: Secondary | ICD-10-CM

## 2022-06-28 LAB — CBC
HCT: 25.4 % — ABNORMAL LOW (ref 36.0–46.0)
Hemoglobin: 8.4 g/dL — ABNORMAL LOW (ref 12.0–15.0)
MCH: 37.2 pg — ABNORMAL HIGH (ref 26.0–34.0)
MCHC: 33.1 g/dL (ref 30.0–36.0)
MCV: 112.4 fL — ABNORMAL HIGH (ref 80.0–100.0)
Platelets: 137 10*3/uL — ABNORMAL LOW (ref 150–400)
RBC: 2.26 MIL/uL — ABNORMAL LOW (ref 3.87–5.11)
RDW: 19.5 % — ABNORMAL HIGH (ref 11.5–15.5)
WBC: 32.1 10*3/uL — ABNORMAL HIGH (ref 4.0–10.5)
nRBC: 0.8 % — ABNORMAL HIGH (ref 0.0–0.2)

## 2022-06-28 LAB — BODY FLUID CELL COUNT WITH DIFFERENTIAL
Eos, Fluid: 0 %
Lymphs, Fluid: 67 %
Monocyte-Macrophage-Serous Fluid: 13 % — ABNORMAL LOW (ref 50–90)
Neutrophil Count, Fluid: 20 % (ref 0–25)
Total Nucleated Cell Count, Fluid: 99 cu mm (ref 0–1000)

## 2022-06-28 LAB — COMPREHENSIVE METABOLIC PANEL
ALT: 30 U/L (ref 0–44)
AST: 80 U/L — ABNORMAL HIGH (ref 15–41)
Albumin: 2.4 g/dL — ABNORMAL LOW (ref 3.5–5.0)
Alkaline Phosphatase: 105 U/L (ref 38–126)
Anion gap: 18 — ABNORMAL HIGH (ref 5–15)
BUN: 133 mg/dL — ABNORMAL HIGH (ref 6–20)
CO2: 21 mmol/L — ABNORMAL LOW (ref 22–32)
Calcium: 8.5 mg/dL — ABNORMAL LOW (ref 8.9–10.3)
Chloride: 98 mmol/L (ref 98–111)
Creatinine, Ser: 2.77 mg/dL — ABNORMAL HIGH (ref 0.44–1.00)
GFR, Estimated: 20 mL/min — ABNORMAL LOW (ref >60)
Glucose, Bld: 98 mg/dL (ref 70–99)
Potassium: 4.3 mmol/L (ref 3.5–5.1)
Sodium: 137 mmol/L (ref 135–145)
Total Bilirubin: 3.1 mg/dL — ABNORMAL HIGH (ref 0.3–1.2)
Total Protein: 5.6 g/dL — ABNORMAL LOW (ref 6.5–8.1)

## 2022-06-28 LAB — GLUCOSE, CAPILLARY
Glucose-Capillary: 110 mg/dL — ABNORMAL HIGH (ref 70–99)
Glucose-Capillary: 114 mg/dL — ABNORMAL HIGH (ref 70–99)
Glucose-Capillary: 121 mg/dL — ABNORMAL HIGH (ref 70–99)
Glucose-Capillary: 139 mg/dL — ABNORMAL HIGH (ref 70–99)
Glucose-Capillary: 160 mg/dL — ABNORMAL HIGH (ref 70–99)
Glucose-Capillary: 170 mg/dL — ABNORMAL HIGH (ref 70–99)

## 2022-06-28 LAB — ALBUMIN, PLEURAL OR PERITONEAL FLUID: Albumin, Fluid: <1.5 g/dL

## 2022-06-28 LAB — IRON AND TIBC
Iron: 54 ug/dL (ref 28–170)
Saturation Ratios: 24 % (ref 10.4–31.8)
TIBC: 221 ug/dL — ABNORMAL LOW (ref 250–450)
UIBC: 167 ug/dL

## 2022-06-28 LAB — FERRITIN: Ferritin: 777 ng/mL — ABNORMAL HIGH (ref 11–307)

## 2022-06-28 LAB — LACTIC ACID, PLASMA
Lactic Acid, Venous: 2.1 mmol/L (ref 0.5–1.9)
Lactic Acid, Venous: 1.7 mmol/L (ref 0.5–1.9)

## 2022-06-28 MED ORDER — SODIUM CHLORIDE 0.9 % IV SOLN
500.0000 mg | INTRAVENOUS | Status: DC
  Filled 2022-06-28: qty 10

## 2022-06-28 MED ORDER — PIPERACILLIN-TAZOBACTAM IN DEX 2-0.25 GM/50ML IV SOLN
2.2500 g | Freq: Three times a day (TID) | INTRAVENOUS | Status: DC
  Filled 2022-06-28: qty 50

## 2022-06-28 MED ORDER — RENA-VITE PO TABS
1.0000 | ORAL_TABLET | Freq: Every day | ORAL | Status: DC
  Administered 2022-06-28 – 2022-07-03 (×6): 1
  Filled 2022-06-28 (×6): qty 1

## 2022-06-28 MED ORDER — SODIUM CHLORIDE 0.9 % IV SOLN
500.0000 mg | Freq: Once | INTRAVENOUS | Status: AC
  Administered 2022-06-28: 500 mg via INTRAVENOUS
  Filled 2022-06-28: qty 10

## 2022-06-28 MED ORDER — VANCOMYCIN VARIABLE DOSE PER UNSTABLE RENAL FUNCTION (PHARMACIST DOSING): Status: DC

## 2022-06-28 MED ORDER — VANCOMYCIN HCL 1750 MG/350ML IV SOLN
1750.0000 mg | Freq: Once | INTRAVENOUS | Status: AC
  Administered 2022-06-28: 1750 mg via INTRAVENOUS
  Filled 2022-06-28: qty 350

## 2022-06-28 NOTE — Progress Notes (Signed)
NAME:  Deanna Mcmillan, MRN:  [id number], DOB:  May 21, 1971, LOS: 79 ADMISSION DATE:  05/26/2022, CONSULTATION DATE:  11/7 REFERRING MD:  Lendon Colonel, EDP CHIEF COMPLAINT:  AMS   History of Present Illness:  51 yo woman with hx of recent diagnosis of alcoholic hepatitis (dx 56/25), here with ams, fever.  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 Seizure-EEG neg Started on pulse steroids per neuro for autoimmune cause    Recently seen at [facility] 10/19: LE Edema. Lasix not helpful at home.  Admitted for diuresis.  Jaundice and abd distension also noted.  Pleural effusion.  GI saw: non enough ascites for tap  Started lasix and spironolactone.  28 day course of prednisolone, miralax, lactulose, miralax. Midodrine TID     Pertinent  Medical History  ETOH Cirrhosis BLE edema recently worsening  S/p lap appy 2014    Echo: recent normal [id number]/8937   Home meds: folic ascid, acetaminohpen, lasix 40bid, lactulose 30 TID protonix, prednisone (04/29/22), prednisolone 04/28/22 three week course, thiamine, simethicone,    Per her mother she had been drinking at least one bottle of wine daily  Significant Hospital Events: Including procedures, antibiotic start and stop dates in addition to other pertinent events   11/7: Intubated in ED and admitted to ICU 11/7: CTH neg, CT A/P cirrhosis with portal hypertension, splenomegaly, moderate volume ascites, moderate bilateral pleural effusion Blood culture - Listeria and MSSA and MSSE (per mom health dept thinks she got it at [facility] but patient does love cheese) Urine culture - Pan sensitive E colii 11/8 cutlures Trach - MSSA Blood - neg 11/9 KUB: No ileus 11/10: LP confirms listeria meningitis 11/11 KUB: ileus 11/12 ileus resolved with aggressive bowel regimen 11/15 KUB:  No ileus. 11/18 more awake, following commands 1121 RUE PICC 11/22 extubated to BiPAP 11/23 - 14d of Rx complerted for MSSA/MESSE. For listeria - ID wants to extend amp/gent to 3 weeks 11/8-11/29. Ileus post extubation and NG to LIS . [place] increased. BP sofft an dlevophed and alb Rx.   11/24 -  OG placed yesterday for ileus and bile returns. TF on hold. Lactulose increased yesterday. REamins off vent. Did not need BiPAP. This AM more obtunded but mom thinks was awake at night and is sleeping due to fatigue. Afebrile.  But wBC up 13L. Neg baklance -3L:  OFF PRESSORs = Ammonia up at 72 - [place] mild periphepatic ascites Blood culture  11/25 - Now intubated due to obtunded encephaopathy. CTAbc with SBO v Ileus with transition ponit . CCS consult - no surgery but ok for rectal lactulose w ith continue NG/OG tube to LIS. ID ordered repeat blood culture. Linezolid added.  No ntrition since 06/14/22 Pm. Creat worsening. LEss Ur OP. On levophed 57mcg. Not on sedation. Vent 40% Ammonia 108 Trach aspirate 11/27 - Remains intubated. On propofol 72mcg and levophed 57mcg. Febrile up to 101.70F overnight. On ampicillin and linezolid, going for IR guided LP today. Kidney function improving, repleting potassium. No seizure activity noted overnight on EEG. Surgery following for ileus, no need for surgical intervention. Holding TF for today.  11/29 - started on pulse dose steroids, getting autoimmune workup, CSF studies Interim History / Subjective:   Appears more obtunded today. Still remains encephalopathic since most recent seizure activity following HD 2 nights ago. She had a low grade fever  yesterday evening and WBC count increased today. Concerned for infection. Her lines are without erythema or purulent drainage. She does have significant ascites on exam and bedside ultrasound, concern for SBP. She will be receiving HD today, should help with volume overload state.  Objective   Blood pressure 128/62, pulse  (!) 111, temperature 99.9 F (37.7 C), resp. rate (!) 30, weight 78.7 kg, SpO2 92 %.    Vent Mode: PRVC FiO2 (%):  [40 %] 40 % Set Rate:  [20 bmp] 20 bmp Vt Set:  [430 mL] 430 mL PEEP:  [5 cmH20] 5 cmH20 Pressure Support:  [8 cmH20] 8 cmH20 Plateau Pressure:  [15 cmH20-20 cmH20] 15 cmH20   Intake/Output Summary (Last 24 hours) at 06/28/2022 1514 Last data filed at 06/28/2022 1400 Gross per 24 hour  Intake 2655.08 ml  Output 660 ml  Net 1995.08 ml   Filed Weights   06/26/22 0216 06/26/22 1715 06/26/22 2031  Weight: 67.2 kg 79.2 kg 78.7 kg   General: critically ill-appearing middle aged female, laying in bed, on mech vent, NAD. Eyes: PERRL, scleral icterus present. CV: tachycardic rate and regular rhythm, no murmurs. Pulm: bronchial breath sounds bilaterally, on full ventilatory support. GI: soft, distended, normoactive bowel sounds. MSK: anasarca Skin: warm and dry. Neuro: RASS -2, not following commands, no withdrawal to noxious stimuli in any extremity. Slight head movement with squeeze of shoulders bilaterally.   Resolved Hospital Problem list   Hypokalemia Hypophosphatemia Hypernatremia Thrombocytopenia Ileus Shock MSSA bacteremia Klebsiella HAP  Assessment & Plan:   Toxic metabolic encephalopathy Cirrhosis with ascites [MELD-Na 17, Child Pugh Class C] GTC seizures (11/26, 12/5) Uremia She remains encephalopathic since her most recent seizure on 12/5. With worsening leukocytosis, repeat low grade fevers, and persistent encephalopathy, will perform infectious workup again to ensure no active infection. She certainly has the risk factors to redevelop infection. Given significant ascites on ultrasound, will also perform paracentesis to evaluate ascitic fluid. Empirically expand antibiotic coverage to vancomycin with HD and merrem. No repeat seizure activity noted, but will check EEG with HD to ensure no recurrence with dialysis. Palliative care has kindly helped guide  [place] discussions, transitioned to DNR status today. -appreciate neurology, ID, nephrology, and palliative care assistance -lactulose daily, rifaximin BID -vancomycin qHD, merrem (will also cover listeria) -keppra 500mg  BID, additional 500mg  after HD -cEEG -volume removal and metabolic clearance with HD -f/u remaining autoimmune workup -f/u blood cultures, respiratory cultures, lactate -f/u CXR, KUB -paracentesis today  Shock, circulatory versus septic - improved Klebsiella HAP - completed therapy -remains off of pressor support -continue midodrine 10mg  q8h per tube  Acute hypoxic respiratory failure  Given lack of ability to wean, anticipate that patient may need ventilatory support for weeks to months. If plan remains to continue full scope of care, will need trach placement this week as she has remained on mechanical ventilation for over 2 weeks. Discussed with mother.  -extubated 11/22, reintubated 11/24 -does not yet meet criteria for SBT/extubation given shock, AKI, and encephalopathy  Listeria meningoencephalitis Listeria bacteremia - completed therapy MSSA bacteremia - completed therapy Unclear picture as listeria should have been treated by now. Per ID, listeria CNS disease can remain and treatment can be up to 6 weeks (currently on week 4 of treatment).  -appreciate ID recommendations -finished course of antibiotics for MSSA bacteremia -transitioned to vancomycin and merrem for empiric coverage (see above), can transition back to ampicillin if negative workup  AKI (Baseline 0.6- 0.9)  Severe Azotemia Metabolic acidosis 2/2 acute  renal failure Anasarca Plan for 2 week course of iHD to assess if neurological exam improves with metabolic clearance and volume optimization. -appreciate nephrology assistance -HD today -avoid nephrotoxic meds -trend kidney function  Hyperglycemia, uncontrolled CBGs better controlled overnight.  -resistant SSI -TF coverage with 8u  q4h -levemir 12u BID -goal BG 140-180  Severe physical deconditioning Protein caloric malnutrition, mild She has remained bedbound in the ICU for almost 1 month now, has profound neuromuscular weakness. Anticipate that she will need months of physical therapy IF she has any clinically significant neurological recovery moving forward. -TF at goal -She will need aggressive PT/OT (likely CIR) for strength training.  Prolonged QTc interval  Hx of SVT - monitor - avoid QTc prolonging medications  Anemia - chronic (baseline hgb 8-9 in oct 2023) -trend hgb curve, transfuse if hgb <7 and/or hemodynamic instability   Best Practice (right click and "Reselect all SmartList Selections" daily)   Diet/type: tubefeeds  DVT prophylaxis: prophylactic heparin  GI prophylaxis: PPI Lines: Central line - RUE PICC since 06/12/22; R femoral vein CVC placed 06/26/22 Foley:  removed 06/26/22 Code Status:  full code Last date of multidisciplinary goals of care discussion [updated mother at bedside daily, she is agreeable to tracheostomy]    Virl Axe, MD 06/28/2022, 3:14 PM   LABS   CBC Recent Labs  Lab 06/26/22 0205 06/27/22 0423 06/28/22 0810  HGB 8.1* 8.5* 8.4*  HCT 25.0* 24.9* 25.4*  WBC 17.5* 23.4* 32.1*  PLT 102* 108* 137*    CHEMISTRY Recent Labs  Lab 06/23/22 1247 06/24/22 0259 06/25/22 0319 06/26/22 0205 06/27/22 0423 06/28/22 0810  NA  --  142 138 141 134* 137  K 3.4* 3.6 4.1 4.1 3.7 4.3  CL  --  108 100 103 95* 98  CO2  --  16* 22 19* 20* 21*  GLUCOSE  --  278* 226* 299* 211* 98  BUN  --  131* 148* 168* 116* 133*  CREATININE  --  2.70* 2.84* 3.26* 2.34* 2.77*  CALCIUM  --  8.9 8.0* 8.2* 9.1 8.5*  MG 2.6* 2.7* 2.4 2.7* 2.2  --   PHOS  --  2.8 4.3 6.6* 6.6*  --    Estimated Creatinine Clearance: 24.1 mL/min (A) (by C-G formula based on SCr of 2.77 mg/dL (H)).   LIVER Recent Labs  Lab 06/22/22 0304 06/23/22 0259 06/24/22 0259 06/25/22 0319 06/28/22 0810   AST 67* 80* 97* 98* 80*  ALT 21 23 26 30 30   ALKPHOS 89 105 120 105 105  BILITOT 4.0* 3.4* 3.4* 3.4* 3.1*  PROT 6.2* 6.4* 6.1* 5.7* 5.6*  ALBUMIN 2.7* 2.6* 2.6* 2.4* 2.4*    ENDOCRINE CBG (last 3)  Recent Labs    06/28/22 0327 06/28/22 0750 06/28/22 1118  GLUCAP 110* 121* 114*

## 2022-06-28 NOTE — Procedures (Signed)
EEG Procedure CPT/Type of Study: 44975; 24hr EEG with video Referring Provider: Agarwala Primary Neurological Diagnosis: seizure   History: This is a 51 yr old patient, undergoing an EEG to evaluate for seizures. Clinical State: disoriented   Technical Description:   The EEG was performed using standard setting per the guidelines of American Clinical Neurophysiology Society (ACNS).   A minimum of 21 electrodes were placed on scalp according to the International 10-20 or/and 10-10 Systems. Supplemental electrodes were placed as needed. Single EKG electrode was also used to detect cardiac arrhythmia. Patient's behavior was continuously recorded on video simultaneously with EEG. A minimum of 16 channels were used for data display. Each epoch of study was reviewed manually daily and as needed using standard referential and bipolar montages. Computerized quantitative EEG analysis (such as compressed spectral array analysis, trending, automated spike & seizure detection) were used as indicated.    Day 2: from 0730 06/27/22 to 0730 06/28/22   EEG Description: Overall Amplitude: Low/suppressed Predominant Frequency: The background activity showed low voltage 1-5Hz  activity with improved arousal patterns.  Superimposed Frequencies: none The background was symmetric   Background Abnormalities: Generalized slowing: diffuse low voltage slowing as above with resolved attenuation Rhythmic or periodic pattern: No Epileptiform activity: no Electrographic seizures: no Events: no    Breach rhythm: no   Reactivity: Present with atypical arousal patterns   Stimulation procedures:  Hyperventilation: not done Photic stimulation: no change   Sleep Background: none   EKG: irregular rhythm   Impression: This was an abnormal continuous video EEG due to diffuse slowing with resolved background attenuation, indicative of an encephalopathy pattern with less effect from sedation. No seizures or  epileptiform discharges were seen.

## 2022-06-28 NOTE — Progress Notes (Signed)
Nutrition Follow-up  DOCUMENTATION CODES:  Not applicable  INTERVENTION:  Tube feeding cortrak tube: Vital 1.5 @ 59m/h (1.32L/d) Prosource TF 1x/d Free water (per MD) [id number] Provides 2060kcal, 109g protein, and 100834mf free water (1408m45m+flush) Renavit daily for HD  NUTRITION DIAGNOSIS:  Inadequate oral intake related to inability to eat as evidenced by NPO status. - remains applicable  GOAL:  Patient will meet greater than or equal to 90% of their needs - progressing, being met with TF  MONITOR:  Vent status, Labs, TF tolerance  REASON FOR ASSESSMENT:  Consult Enteral/tube feeding initiation and management  ASSESSMENT:  Pt with hx of EtOH abuse with cirrhosis presented to ED after being found unresponsive.   11/7 - intubated and admitted to ICU 11/15 - cortrak placed 11/22 - extubated 11/23 - NGT placed for suction for ileus, TF via cortrak held 11/24 - reintubated 11/29 - LP 12/5 temporary HD cath placed, iHD trial initiated  No significant changes since last assessment. TF held earlier in the week for distention. Imaging negative and now currently infusing at goal rate again. Next HD session planned for today.  MV: 12.4 L/min Temp (24hrs), Avg:99.5 F (37.5 C), Min:97.9 F (36.6 C), Max:100.6 F (38.1 C)  Intake/Output Summary (Last 24 hours) at 06/28/2022 1046 Last data filed at 06/28/2022 1000 Gross per 24 hour  Intake 2140.88 ml  Output 660 ml  Net 1480.88 ml  Net IO Since Admission: 12,823.82 mL [06/28/22 1046]  Nutritionally Relevant Medications: Scheduled Meds:  feeding supplement (PROSource TF20)  60 mL Per Tube Daily   folic acid  1 mg Per Tube Daily   free water  100 mL Per Tube Q6H   insulin aspart  0-20 Units Subcutaneous Q4H   insulin aspart  8 Units Subcutaneous Q4H   insulin detemir  12 Units Subcutaneous BID   lactulose  10 g Per Tube Daily   pantoprazole (PROTONIX) IV  40 mg Intravenous Q24H   thiamine  100 mg Per Tube Daily    Continuous Infusions:  feeding supplement (VITAL 1.5 CAL) 55 mL/hr at 06/28/22 1000   Labs Reviewed: BUN 133, 2.77 creatinine CBG ranges from 110-231 mg/dL over the last 24 hours  Micronutrient Labs: Folate 31.2 Vitamin B12 1,454  NUTRITION - FOCUSED PHYSICAL EXAM: Flowsheet Row Most Recent Value  Orbital Region No depletion  Upper Arm Region No depletion  Thoracic and Lumbar Region No depletion  Buccal Region No depletion  Temple Region No depletion  Clavicle Bone Region No depletion  Clavicle and Acromion Bone Region No depletion  Scapular Bone Region No depletion  Dorsal Hand No depletion  Patellar Region No depletion  Anterior Thigh Region No depletion  Posterior Calf Region No depletion  Edema (RD Assessment) Moderate  [pitting to BLE and BUE]  Hair Reviewed  Eyes Reviewed  Mouth Reviewed  Skin Reviewed  Nails Reviewed   Diet Order:   Diet Order             Diet NPO time specified  Diet effective now                  EDUCATION NEEDS:  Not appropriate for education at this time  Skin:  Skin Assessment: Reviewed RN Assessment  Last BM:  12/7 - type 6 via rectal tube Rectal tube: 555mL27m in 24 hours  Height:  Ht Readings from Last 1 Encounters:  05/10/22 _0  (1.626 m)   Weight:  Wt Readings from Last 1 Encounters:  06/26/22 78.7 kg    Ideal Body Weight:  54.5 kg  BMI:  Body mass index is 29.78 kg/m.  Estimated Nutritional Needs:  Kcal:  1800-2000 kcal/d Protein:  90-110 g/d Fluid:  2L/d   Deanna Mcmillan, RD, LDN Clinical Dietitian RD pager # available in [place]  After hours/weekend pager # available in [facility]

## 2022-06-28 NOTE — Procedures (Addendum)
Paracentesis Procedure Note  Indications:  ascites, concern for SBP  Procedure Details  Informed consent was obtained after explanation of the risks and benefits of the procedure, refer to the consent documentation.  Time-out was performed immediately prior to the procedure.  The head of the bed was placed 30-45 degrees above level and the meniscus of the ascites was evaluated by bedside ultrasound and a large area of ascitic fluid was identified in the left lower quadrant.  Local anesthesia with 1 percent lidocaine was introduced subcutaneously then deep to the skin until the parietal peritoneum was anesthetized. A parcentesis needle was introduced into this site until ascitic fluid was encountered.  Ascitic fluid and the needle were removed with minimal bleeding.  A sterile bandage was placed after holding pressure.   Findings: 2350 mL of straw colored ascites fluid was obtained.  The ascites fluid was sent for cell count with differential, culture and gram stain, and albumin.        Condition:   The patient tolerated the procedure well and remains in the same condition as pre-procedure.  Complications: None; patient tolerated the procedure well.

## 2022-06-28 NOTE — Progress Notes (Signed)
MB performed maintenance on electrodes. All impedances are below 10k ohms. No skin breakdown noted.  

## 2022-06-28 NOTE — Progress Notes (Signed)
Pharmacy Antibiotic Note  Deanna Mcmillan is a 51 y.o. female admitted on 06/07/2022 with Listeria meningitis and has been on ampicillin monotherapy.  Now with fever and worsening leukocytosis.  Pharmacy consulted to broaden antibiotic to vancomycin and Merrem.  Patient has an AKI started on iHD - last session 12/5 (2.5 hr BFR 250), next planned for 12/7.   Plan: Vanc 1750mg  IV x 1, then 750mg  IV qHD pending tolerance Merrem 500mg  IV Q24H Monitor HD tolerance/schedule, repeat micro data, vanc level as indicated   Weight: 78.7 kg (173 lb 8 oz)  Temp (24hrs), Avg:99.5 F (37.5 C), Min:97.9 F (36.6 C), Max:100.6 F (38.1 C)  Recent Labs  Lab 06/24/22 0259 06/25/22 0319 06/26/22 0205 06/27/22 0423 06/28/22 0810  WBC 23.6* 20.8* 17.5* 23.4* 32.1*  CREATININE 2.70* 2.84* 3.26* 2.34* 2.77*     Estimated Creatinine Clearance: 24.1 mL/min (A) (by C-G formula based on SCr of 2.77 mg/dL (H)).    No Known Allergies   Vanc 11/7 >> 11/11, 12/7 >> CFP 11/7 >> 11/8  Flagyl 11/7 >> 11/9  CTX 11/8 >>11/11 Ampicillin 11/8 >>11/27 Gent 11/8 >> 11/24 Ancef 11/11 >>11/22 Linezolid 11/24 >> 11/27 Cefazolin 11/29 >>11/27 Meropenem 11/27>12/4, 12/7 >>  Ampicillin 12/4 >> 12/7   11/7 BCx - Listeria, Staph aureus, staph epi via BCID  11/7 UCx > E.coli pan sens 11/8 para cx - negative  11/8 BCx - negative 11/8 TA - MSSA 11/10 listeria in blood and CSF 11/24 bcx: neg 11.25 TA: rare kleb oxy (ESBL) 11/27 csf - neg 11/29 CSF - neg   11/10 GP/GT 6.9/4.3 >> decr to 80mg  q12 11/13-11/14: GP/GT 5.2/1.6 >> decr to 40mg  q12 11/17 GP/GT 6.3/1.1 >> decr  to 20mg  q12 11/20-11/21 GP/GT 1.4/<0.5 >> inc to 60 mg q24h  Nathaneil Feagans D. Mina Marble, PharmD, BCPS, [place] 06/28/2022, 11:38 AM

## 2022-06-28 NOTE — Progress Notes (Addendum)
Daily Progress Note   Patient Name: Deanna Mcmillan       Date: 06/28/2022 DOB: [birth year]  Age: 51 y.o. MRN#: [id number] Attending Physician: Kipp Brood, MD Primary Care Physician: Elisabeth Cara, [place] Admit Date: 06/09/2022  Reason for Consultation/Follow-up: Establishing goals of care  Subjective: Intubated - does not wake to voice or gentle touch  Length of Stay: 30  Current Medications: Scheduled Meds:   Chlorhexidine Gluconate Cloth  6 each Topical Daily   Chlorhexidine Gluconate Cloth  6 each Topical Q0600   darbepoetin (ARANESP) injection - DIALYSIS  150 mcg Subcutaneous Q Wed-1800   feeding supplement (PROSource TF20)  60 mL Per Tube Daily   folic acid  1 mg Per Tube Daily   free water  100 mL Per Tube Q6H   heparin  5,000 Units Subcutaneous Q8H   insulin aspart  0-20 Units Subcutaneous Q4H   insulin aspart  8 Units Subcutaneous Q4H   insulin detemir  12 Units Subcutaneous BID   lactulose  10 g Per Tube Daily   leptospermum manuka honey  1 Application Topical QHS   levETIRAcetam  500 mg Per Tube BID   midodrine  10 mg Per Tube Q8H   modafinil  100 mg Per Tube Daily   multivitamin  1 tablet Per Tube QHS   mouth rinse  15 mL Mouth Rinse Q2H   pantoprazole (PROTONIX) IV  40 mg Intravenous Q24H   rifaximin  550 mg Per Tube BID   sodium chloride flush  10-40 mL Intracatheter Q12H   thiamine  100 mg Per Tube Daily    Continuous Infusions:  sodium chloride 10 mL/hr at 06/28/22 1000   anticoagulant sodium citrate     feeding supplement (VITAL 1.5 CAL) 55 mL/hr at 06/28/22 1000   lactated ringers 10 mL/hr at 06/28/22 1000   levETIRAcetam     [START ON 06/29/2022] meropenem (MERREM) IV     vancomycin 1,750 mg (06/28/22 1153)    PRN Meds: sodium chloride, acetaminophen  (TYLENOL) oral liquid 160 mg/5 mL, alteplase, anticoagulant sodium citrate, fentaNYL (SUBLIMAZE) injection, heparin, lidocaine (PF), lidocaine-prilocaine, lip balm, LORazepam, mouth rinse, pentafluoroprop-tetrafluoroeth, sodium chloride flush, white petrolatum  Physical Exam Constitutional:      General: She is not in acute distress.    Appearance: She is ill-appearing.     Comments: Intubated - does not wake to voice or gentle touch  Pulmonary:     Effort: Pulmonary effort is normal.  Skin:    General: Skin is warm and dry.             Vital Signs: BP 130/74   Pulse (!) 107   Temp 100 F (37.8 C)   Resp (!) 24   Wt 78.7 kg   SpO2 93%   BMI 29.78 kg/m  SpO2: SpO2: 93 % O2 Device: O2 Device: Ventilator O2 Flow Rate: O2 Flow Rate (L/min): 4 L/min  Intake/output summary:  Intake/Output Summary (Last 24 hours) at 06/28/2022 1352 Last data filed at 06/28/2022 1314 Gross per 24 hour  Intake 2155.89 ml  Output 660 ml  Net 1495.89 ml    LBM: Last BM Date : 06/28/22 Baseline Weight: Weight: 101 kg Most  recent weight: Weight: 78.7 kg   Patient Active Problem List   Diagnosis Date Noted   AKI (acute kidney injury) ([place]) 06/23/2022   Acute encephalopathy 06/22/2022   MSSA bacteremia 06/16/2022   Small bowel obstruction ([place]) 06/16/2022   Compromised airway 06/15/2022   Septic shock ([place]) 06/14/2022   On mechanically assisted ventilation ([place]) 06/11/2022   Sepsis with acute hypoxic respiratory failure ([place]) 06/11/2022   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   Acute pulmonary edema ([place]) 09/47/0962   Toxic metabolic encephalopathy 83/66/2947   Pressure injury of skin 06/06/2022   Acute respiratory failure with hypoxia ([place]) 06/10/2022   Anasarca 05/13/2022   Abnormal LFTs 65/46/5035   Alcoholic hepatitis 46/56/8127   Normal anion gap metabolic acidosis 51/70/0174   Hyperglycemia 05/13/2022   Coagulopathy ([place]) 05/13/2022   Thrombocytopenia ([place]) 05/13/2022   Macrocytic anemia  05/13/2022   Obesity (BMI 30-39.9) 05/13/2022   Leg edema 05/11/2022   Advanced care planning/counseling discussion    Depression, major, single episode, moderate ([place])    Goals of care, counseling/discussion    Serum total bilirubin elevated 04/24/2022   Alcohol induced acute pancreatitis 94/49/6759   Alcoholic liver disease, unspecified ([place]) 04/23/2022   Hypoalbuminemia 04/23/2022   Hyponatremia 04/23/2022   Alcohol abuse 04/23/2022   QT prolongation 04/23/2022   Hypokalemia 04/23/2022   Appendicitis, acute s/p laparoscopic appendectomy 04/11/13 04/12/2013    Palliative Care Assessment & Plan   HPI: 51 y.o. female  with past medical history of ETOH cirrhosis admitted on 06/17/2022 with severe sepsis and acute metabolic encephalopathy. 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 With acute respiratory failure as well - extubated 11/22. PMT consulted to discuss Deanna Mcmillan.     Assessment: Follow up today with patient's mother, Deanna Mcmillan, at bedside.  We review events since we met last week. We discuss gravity of her illness and she reports understanding.   Deanna Mcmillan tells me she understands from medical team concern that Deanna Mcmillan will not improve; however, she tells me she needs to feel that absolutely everything that could be offered to help Deanna Mcmillan has been done. We review care she has received and no improvement in mental status.   Deanna Mcmillan tells me the 1 thing she is certain about as the patient would not want to remain in the states she is now and she does not want the patient to leave the hospital in the state.  We discussed that by pursuing things like trach and hemodialysis she is being set up for her long-term acute care.  Deanna Mcmillan tells me she does not want this.  She tells me she needs more information from the medical team and if we are truly out of place for now and expect Deanna Mcmillan to improve from this point she needs to know that.  I discussed case with critical care team and we made plan to  continue dialysis through the week and plan for family meeting next week to include multiple specialties involved in Deanna Mcmillan's care.  Deanna Mcmillan was agreeable to this plan as she would like to continue dialysis for now just to see if it offers any improvement in mental status.  Also discussed CODE STATUS.Encouraged Deanna Mcmillan to consider DNR status understanding evidenced based poor outcomes in similar hospitalized patients, as the cause of the arrest is likely associated with chronic/terminal disease rather than a reversible acute cardio-pulmonary event.  Deanna Mcmillan agrees DNR is appropriate and in line with patient's previously stated wishes.  Chaplaincy support offered - declined.    Recommendations/Plan: CODE STATUS changed to DNR Patient's mother  seems to understand concern the patient is not improving, she would like to continue dialysis for now to see if it offers any improvement -planning for a family meeting to include multiple specialties next week Family clear they do not want patient to go to [facility] PMT will continue to follow  Code Status: DNR  Care plan was discussed with critical care team, RN, patient's mother  Thank you for allowing the Palliative Medicine Team to assist in the care of this patient.   *Please note that this is a verbal dictation therefore any spelling or grammatical errors are due to the "[employer]" system interpretation.  Juel Burrow, DNP, [facility] Palliative Medicine Team Team Phone # [phone]  Pager [phone]

## 2022-06-28 NOTE — Progress Notes (Signed)
Palliative:  Received message about additional need for palliative. Call to family - no answer.  Went to bedside and family asleep at bedside - will attempt to se later today/tomorrow.  Juel Burrow, DNP, AGNP-C Palliative Medicine Team Team Phone # [phone]  Pager # [phone]  NO CHARGE

## 2022-06-28 NOTE — Progress Notes (Signed)
MB went up to D/C Patient from LTM EEG per Dr. Quinn Axe. Upon arrival and based on family member concerns, the Patients Dr. Lindajo Royal has decided to continue to run LTM EEG during patient Dialysis Treatment today to confirm any further seizure activity.  This is in part due to concern of these episodes occur during the time she has Dialysis and potentially has a seizure episode. I restarted LTM EEG and called [facility] once again after-the-fact to inform them that it will continue for LTM.

## 2022-06-28 NOTE — Progress Notes (Signed)
NEUROLOGY CONSULTATION PROGRESS NOTE   Date of service: June 28, 2022 Patient Name: Deanna Mcmillan MRN:  [id number] DOB:  Apr 30, 1971  Interval Hx   No further clinical seizure activity  No electrographic seizures on EEG overnight  Vitals   Vitals:   06/28/22 1500 06/28/22 1503 06/28/22 1511 06/28/22 1530  BP: 128/62 128/62    Pulse: (!) 108 (!) 111  (!) 112  Resp: (!) 28 (!) 30  (!) 30  Temp: 100 F (37.8 C)  99.9 F (37.7 C) 100.2 F (37.9 C)  TempSrc:      SpO2: 92% 92%  92%  Weight:         Body mass index is 29.78 kg/m.  Physical Exam   General: Laying comfortably in bed; intubated. HENT: Normal oropharynx and mucosa. Normal external appearance of ears and nose. Neck: Supple, no pain or tenderness  CV: No JVD. No peripheral edema.  Pulmonary: Symmetric Chest rise. Not breathing over vent. Abdomen: Soft to touch, non-tender. Ext: No cyanosis, significant bruising of BL upper extremities along with generalized edema. Skin: No rash. Normal palpation of skin.  Musculoskeletal: Normal digits and nails by inspection. No clubbing.   Mental status/Cognition: eyes closed, does not open eyes to loud voice or to tactile stimulation. No response to noxious stimuli Speech/language: obtunded mentation, mute. No attempts to communicate. Cranial nerves:   CN II Pupils equal and reactive to light, unable to assess for VF deficits.   CN III,IV,VI EOM weakly intact to dolls eyes   CN V Corneals absent   CN VII Facial diplegia.   CN VIII Does not turn head towards speech   CN IX & X Weak + cough, unable to get far back in the oropharynx to ilicit a gag.   CN XI Head midline   CN XII midline tongue but does not protrude on command.   Motor/sensory:  Muscle bulk: hard to assess with significant edema. Tone: flaccid in all extremities. No response to pain in any of the extremities.  Coordination/Complex Motor:  Unable to assess.  Labs   Basic Metabolic Panel:  Lab  Results  Component Value Date   NA 137 06/28/2022   K 4.3 06/28/2022   CO2 21 (L) 06/28/2022   GLUCOSE 98 06/28/2022   BUN 133 (H) 06/28/2022   CREATININE 2.77 (H) 06/28/2022   CALCIUM 8.5 (L) 06/28/2022   GFRNONAA 20 (L) 06/28/2022   GFRAA >90 04/12/2013   HbA1c:  Lab Results  Component Value Date   HGBA1C <4.2 (L) 04/25/2022   LDL:  Lab Results  Component Value Date   LDLCALC NOT CALCULATED 04/25/2022   Urine Drug Screen:     Component Value Date/Time   LABOPIA NONE DETECTED 04/23/2022 0253   COCAINSCRNUR NONE DETECTED 04/23/2022 0253   LABBENZ NONE DETECTED 04/23/2022 0253   AMPHETMU NONE DETECTED 04/23/2022 0253   THCU NONE DETECTED 04/23/2022 0253   LABBARB NONE DETECTED 04/23/2022 0253    Alcohol Level     Component Value Date/Time   ETH <10 04/23/2022 0453   No results found for: "PHENYTOIN", "ZONISAMIDE", "LAMOTRIGINE", "LEVETIRACETA" No results found for: "PHENYTOIN", "PHENOBARB", "VALPROATE", "CBMZ"  Imaging and Diagnostic studies   CSF Opening pressure 11/27 25, closing pressure 19 Opening pressure 11/29 23, closing pressure 12   Latest Reference Range & Units 06/01/22 13:43 06/18/22 11:04  Appearance, CSF CLEAR  CLOUDY ! HAZY !  Glucose, CSF 40 - 70 mg/dL 86 (H) 94 (H) -- serum 193 48% of serum  RBC Count, CSF 0 /cu mm 26,400 (H) 5,000 (H)  WBC, CSF 0 - 5 /cu mm 80 (HH) 191 (HH)  Segmented Neutrophils-CSF 0 - 6 % 37 (H) 3  Lymphs, CSF 40 - 80 % 53 92 (H)  Monocyte-Macrophage-Spinal Fluid 15 - 45 % 9 (L) 5 (L)  Eosinophils, CSF 0 - 1 % 1 0  Color, CSF COLORLESS  RED ! AMBER !  Supernatant   XANTHOCHROMIC XANTHOCHROMIC  Total  Protein, CSF 15 - 45 mg/dL >600 (H) >600 (H)  (HH): Data is critically high; !: Data is abnormal; (H): Data is abnormally high; (L): Data is abnormally low     CSF 11/30: RBC 5725/1800, WBC 61/88 (lymphocytic predominance), xanthochromic with protein of 423 and glucose of 92 (serum 141) CSF elevated IgG 39.2 but IgG index  0.6 which is normal VDRL negative CSF flow and cytopathology were not collected/processed    Autoimmune labs:  Thyroperoxidase Ab SerPl-aCnc 0 - 34 IU/mL 22  Thyroglobulin Antibody 0.0 - 0.9 IU/mL    Anticardiolipin IgM 25, IgG and IgA neg RF neg ANCA neg Anti smooth muscle IgG and mitochondrial antibodies neg ANA neg Anti Jo-1 neg Ribonucleic Protein 0.0 - 0.9 AI 0.3 VC  SSA (Ro) (ENA) Antibody, IgG 0.0 - 0.9 AI <0.2  Scleroderma (Scl-70) (ENA) Antibody, IgG 0.0 - 0.9 AI <0.2  ENA SM Ab Ser-aCnc 0.0 - 0.9 AI <0.2  SSB (La) (ENA) Antibody, IgG 0.0 - 0.9 AI <0.2  ds DNA Ab 0 - 9 IU/mL <1      Additional serum labs TSH: 0.095 (low) fT4 0.72 (wnl) HIV neg   Unresulted Labs (From admission, onward)        Start     Ordered    06/25/22 8563   Basic metabolic panel  Tomorrow morning,   R       Question:  Specimen collection method  Answer:  Unit=Unit collect   06/24/22 1132    06/25/22 0500   Magnesium  Tomorrow morning,   R       Question:  Specimen collection method  Answer:  Unit=Unit collect   06/24/22 1132    06/25/22 0500   Phosphorus  Tomorrow morning,   R       Question:  Specimen collection method  Answer:  Unit=Unit collect   06/24/22 1132    06/23/22 0500   Comprehensive metabolic panel  Daily at 5am,   R     Question:  Specimen collection method  Answer:  Unit=Unit collect   06/22/22 0809    06/23/22 0500   CBC  Daily at 5am,   R     Question:  Specimen collection method  Answer:  Unit=Unit collect   06/22/22 0809    06/20/22 1737   Miscellaneous [facility] test (send-out)  Once,   R        06/20/22 1737    06/20/22 1029   Miscellaneous [facility] test (send-out)  Once,   R       Question:  Test name / description:  Answer:  Autoimmune Encephalitis panel on CSF   06/20/22 1028    06/20/22 1029   Miscellaneous [facility] test (send-out)  Once,   R       Question:  Test name / description:  Answer:  Autoimmune Encephalitis panel on Serum   06/20/22 1028    06/20/22 1029    Oligoclonal bands, CSF + serum  (Oligoclonal Bands, CSF + Serum panel)  Once,   R  06/20/22 1028    06/20/22 1029   Draw extra clot tube  (Oligoclonal Bands, CSF + Serum panel)  Once,   R       Question:  Specimen collection method  Answer:  IV Team=IV Team collect   06/20/22 1028    06/20/22 1029   Draw extra clot tube  (IgG CSF Index, CSF + Serum panel)  Once,   R       Question:  Specimen collection method  Answer:  IV Team=IV Team collect   06/20/22 1028                    MRI brain w/o contrast 06/17/2022 personally reviewed, agree with radiology:   No acute intracranial pathology or epileptogenic focus identified.    MRI brain and C-spine 06/20/2022 1. Negative for acute infarct or mass. 2. Diffuse pachymeningeal enhancement and mild thickening. Sulcal hyperintensity on FLAIR has progressed in the interval. Mild pituitary enlargement for age unchanged. The mamillopontine distance is narrowed at 4.3 mm. Findings can be seen with intracranial hypotension however some of these findings can also be seen with meningitis. 3. No evidence of spinal infection in the cervical spine. Cervical spondylosis causing foraminal narrowing as described above.   CT abdomen pelvis w/o contrast 06/15/22 Multiple dilated loops of small bowel with a transition zone in the mid to distal ileum consistent with a least partial small bowel obstruction. Bibasilar consolidation with left-sided pleural effusions stable from the prior CT. Cirrhotic changes of the liver with ascites. Ascites may also be in part due to the underlying obstructive change.     CT chest 11/30 a.m. formal read pending but on personal review with CCM team there is some concern for pneumonia  Impression   Deanna Mcmillan is a 51 y.o. 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 CSF contamination with blood, appreciate ID following), improving  Nonoliguric AKI likely secondary to ATN associated with shock, appreciate nephrology following  Alcoholic cirrhosis vs. Potential for autoimmune component   Ileus/concern for partial small bowel obstruction  Acute respiratory failure due to shock AKI and encephalopathy  Prolonged Qtc  Chronic anemia.  She had prolonged GTC and left gaze deviation towards the end of her first hemodialysis session 12/5. Suspect that the etiology of seizure was likely keppra removal during HD. However, unclear why the seizure was so resistant and persisted despite 6mg  of Ativan. Seizure spontaneously resolved with no further clinical evidence of seizure. EEG x48 hrs no electrographic seizures.   Recommendations   - Keppra 1000mg  IV once given - continue Keppra 500mg  BID. Will add additional 500mg  IV Keppra to be given after every HD session. - Per family request will continue LTM until tomorrow since she has dialysis again today. Can d/c tmrw if no electrographic seizures overnight  This patient is critically ill and at significant risk of neurological worsening, death and care requires constant monitoring of vital signs, hemodynamics,respiratory and cardiac monitoring, neurological assessment, discussion with family, other specialists and medical decision making of high complexity. I spent 45 minutes of neurocritical care time  in the care of  this patient. This was time spent independent of any time provided by nurse practitioner or PA.  [employer]  Quinn Axe, MD [facility] [phone]  If 7pm- 7am, please page neurology on call as listed in [place].

## 2022-06-28 NOTE — Progress Notes (Signed)
LTM maint complete - no skin breakdown seen under Fp1 Fp2 [facility] monitored, Event button test confirmed by [facility].

## 2022-06-28 NOTE — Progress Notes (Signed)
Subjective:  No more sz activity-  EEG not showing cont sz-  low grade temp but otherwise stable-  plan is for a dialysis treatment today   Objective Vital signs in last 24 hours: Vitals:   06/27/22 1900 06/27/22 2016 06/28/22 0010 06/28/22 0350  BP: (!) 105/56     Pulse: 100 99 94 94  Resp: (!) 30 (!) 29 (!) 25 (!) 21  Temp: 99.9 F (37.7 C) 99.7 F (37.6 C) 99.1 F (37.3 C) 98.6 F (37 C)  TempSrc:      SpO2: 94% 95% 96% 96%  Weight:       Weight change:   Intake/Output Summary (Last 24 hours) at 06/28/2022 0703 Last data filed at 06/28/2022 0500 Gross per 24 hour  Intake 1931.58 ml  Output 660 ml  Net 1271.58 ml    Assessment/Plan:      Non-Oliguric AKI: Likely secondary to ATN associated with shock. Does not appear dehydrated on exam. Foley in place. - Management of shock has not improved her renal function unfortunately-  since making urine not suspicious of obstruction-  urine seemed bland when checked prior-  recheck is same.  Agree that one would not be able to assess her MS with a BUN over 150.  I sat down and talked with the Mother.  Told her my concern that if we started dialysis unsure she would be able to come off and if she does not improve otherwise globally she really would not be a candidate to continue dialysis support-  realistically would be "stuck " in a hospital or LTACH for the rest of her life.  However, I see it from Moms POV-  pt is young and opens her eyes-  she wants to give her every chance so she wont feel guilty in the future.  I offered a 2 week trial of IHD to get her BUN down and to see if she has it in her to improve otherwise.  If her MS does not improve and she cannot wean from vent at that time-  need to reconsider the dialysis.  Mom seems to understand-  I have spoken with CCM resident to place non tunneled HD cath-  this will cement the idea that this is temporary.  One side note, she may need CRRT given her hypermetabolic state and volume status  -  will see what her labs look like after a couple of IHD treatments-  so far so good-  plan for another HD today     AMS/seizures: Using lactulose and rifaximin.  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  abx per primary team and ID   Acute hypoxic respiratory failure: Ventilatory support per primary team  Anemia-  have added ESA and check iron stores-  they seem adequate    Deanna Mcmillan    Labs: Basic Metabolic Panel: Recent Labs  Lab 06/25/22 0319 06/26/22 0205 06/27/22 0423  NA 138 141 134*  K 4.1 4.1 3.7  CL 100 103 95*  CO2 22 19* 20*  GLUCOSE 226* 299* 211*  BUN 148* 168* 116*  CREATININE 2.84* 3.26* 2.34*  CALCIUM 8.0* 8.2* 9.1  PHOS 4.3 6.6* 6.6*   Liver Function Tests: Recent Labs  Lab 06/23/22 0259 06/24/22 0259 06/25/22 0319  AST 80* 97* 98*  ALT 23 26 30   ALKPHOS 105 120 105  BILITOT 3.4* 3.4* 3.4*  PROT 6.4* 6.1* 5.7*  ALBUMIN 2.6* 2.6* 2.4*   No results for input(s): "LIPASE", "AMYLASE" in the last 168 hours. Recent Labs  Lab 06/24/22 0259  AMMONIA 37*   CBC: Recent Labs  Lab 06/22/22 0304 06/23/22 0259 06/24/22 0259 06/25/22 0319 06/26/22 0205 06/27/22 0423  WBC 22.1* 24.2* 23.6* 20.8* 17.5* 23.4*  NEUTROABS 18.5*  --   --   --   --   --   HGB 8.5* 8.4* 8.3* 7.8* 8.1* 8.5*  HCT 26.2* 25.8* 25.0* 23.3* 25.0* 24.9*  MCV 113.9* 113.7* 112.1* 111.5* 113.1* 111.2*  PLT 158 127* 110* 106* 102* 108*   Cardiac Enzymes: No results for input(s): "CKTOTAL", "CKMB", "CKMBINDEX", "TROPONINI" in the last 168 hours. CBG: Recent Labs  Lab 06/27/22 1153 06/27/22 1518 06/27/22 1936 06/28/22 0002 06/28/22 0327  GLUCAP 221* 231* 225* 139* 110*    Iron Studies:   Recent Labs    06/28/22 0423  IRON 54  TIBC 221*  FERRITIN 777*   Studies/Results: Overnight EEG with video  Result Date: 06/27/2022 Samuella Cota, MD     06/27/2022  8:38 AM EEG Procedure CPT/Type of Study: 33354; 2-12hr EEG with video Referring Provider: Agarwala Primary Neurological Diagnosis: seizure History: This is a 51 yr old patient, undergoing an EEG to evaluate for seizures. Clinical State: disoriented Technical Description: The EEG was performed using standard setting per the guidelines of American Clinical Neurophysiology Society (ACNS). A minimum of 21 electrodes were placed on scalp according to the International 10-20 or/and 10-10 Systems. Supplemental electrodes were placed as needed. Single EKG electrode was also used to detect cardiac arrhythmia. Patient's behavior was continuously recorded on video simultaneously with EEG. A minimum of 16 channels were used for data display. Each epoch of study was reviewed manually daily and as needed using standard referential and bipolar montages. Computerized quantitative EEG analysis (such as compressed spectral array analysis, trending, automated spike & seizure detection) were used as indicated. Day 1: from 2300 06/26/22 to 0730 06/27/22 EEG Description: Overall Amplitude: Low/suppressed Predominant Frequency: The background activity showed low voltage 1-3Hz  activity rarely and frequent periods of voltage attenuation. Superimposed Frequencies: none The background was symmetric Background Abnormalities: Generalized slowing: diffuse low voltage slowing and attenuation as above Rhythmic or periodic pattern: No Epileptiform activity: no Electrographic seizures: no Events: no Breach rhythm: no Reactivity: Absent Stimulation procedures: Hyperventilation: not done Photic stimulation: no change Sleep Background: none EKG: irregular rhythm Impression: This was an abnormal continuous video EEG due to diffuse slowing with attenuated background, indicative of a  severe sedated encephalopathy pattern. No seizures or epileptiform discharges were seen.   DG CHEST PORT 1 VIEW  Result Date: 06/27/2022 CLINICAL DATA:  Dyspnea, sepsis, history of alcoholic liver disease EXAM: PORTABLE CHEST 1 VIEW COMPARISON:  Portable exam 0515 hours compared to 06/19/2022 FINDINGS: Tip of endotracheal tube projects 4.9 cm above carina. Feeding tube extends into stomach. RIGHT arm PICC line tip projects over cavoatrial junction. Enlargement of cardiac silhouette. Probable layered LEFT pleural effusion with bibasilar atelectasis versus consolidation greater on LEFT. No pneumothorax or acute osseous findings. IMPRESSION: Bibasilar atelectasis versus infiltrate greater on LEFT with probable LEFT pleural effusion. Electronically Signed   By: Lavonia Dana M.D.   On: 06/27/2022 08:29   US Abdomen Limited RUQ (LIVER/GB)  Result Date: 06/26/2022 CLINICAL DATA:  Alcoholic  hepatitis, cirrhosis, ascites, meningitis, sepsis and respiratory failure. EXAM: ULTRASOUND ABDOMEN LIMITED RIGHT UPPER QUADRANT COMPARISON:  Abdominal ultrasound on 06/14/2022 FINDINGS: Gallbladder: No gallbladder wall thickening or gallstones. Less prominent biliary sludge in the gallbladder lumen. Common bile duct: Diameter: 3 mm Liver: Evidence cirrhotic liver disease with nodular surface contour. No focal masses or evidence of intrahepatic biliary ductal dilatation. Portal vein is patent on color Doppler imaging with normal direction of blood flow towards the liver. Other: Recanalized umbilical vein present likely reflective of underlying portal hypertension. Ascites is seen around the liver. IMPRESSION: Evidence of cirrhosis and portal hypertension with recanalized umbilical vein and ascites. Electronically Signed   By: Aletta Edouard M.D.   On: 06/26/2022 17:05   Medications: Infusions:  sodium chloride 10 mL/hr at 06/28/22 0500   ampicillin (OMNIPEN) IV Stopped (06/27/22 2239)   anticoagulant sodium citrate      feeding supplement (VITAL 1.5 CAL) 55 mL/hr at 06/28/22 0500   lactated ringers 10 mL/hr at 06/28/22 0500   levETIRAcetam      Scheduled Medications:  Chlorhexidine Gluconate Cloth  6 each Topical Daily   Chlorhexidine Gluconate Cloth  6 each Topical Q0600   darbepoetin (ARANESP) injection - DIALYSIS  150 mcg Subcutaneous Q Wed-1800   feeding supplement (PROSource TF20)  60 mL Per Tube Daily   folic acid  1 mg Per Tube Daily   free water  100 mL Per Tube Q6H   heparin  5,000 Units Subcutaneous Q8H   insulin aspart  0-20 Units Subcutaneous Q4H   insulin aspart  8 Units Subcutaneous Q4H   insulin detemir  12 Units Subcutaneous BID   lactulose  10 g Per Tube Daily   leptospermum manuka honey  1 Application Topical QHS   levETIRAcetam  500 mg Per Tube BID   midodrine  10 mg Per Tube Q8H   modafinil  100 mg Per Tube Daily   mouth rinse  15 mL Mouth Rinse Q2H   pantoprazole (PROTONIX) IV  40 mg Intravenous Q24H   rifaximin  550 mg Per Tube BID   sodium chloride flush  10-40 mL Intracatheter Q12H   thiamine  100 mg Per Tube Daily    have reviewed scheduled and prn medications.  Physical Exam: General:  sedated on vent-  opening eyes today  Heart: tachy Lungs:mostly clear Abdomen: soft, non tender Extremities: pitting edema throughout Dialysis Access: femoral temp HD cath -  placed 12/5    06/28/2022,7:03 AM  LOS: 30 days

## 2022-06-29 DIAGNOSIS — J9601 Acute respiratory failure with hypoxia: Secondary | ICD-10-CM | POA: Diagnosis not present

## 2022-06-29 LAB — RENAL FUNCTION PANEL
Albumin: 2.2 g/dL — ABNORMAL LOW (ref 3.5–5.0)
Anion gap: 17 — ABNORMAL HIGH (ref 5–15)
BUN: 105 mg/dL — ABNORMAL HIGH (ref 6–20)
CO2: 20 mmol/L — ABNORMAL LOW (ref 22–32)
Calcium: 8 mg/dL — ABNORMAL LOW (ref 8.9–10.3)
Chloride: 97 mmol/L — ABNORMAL LOW (ref 98–111)
Creatinine, Ser: 2.07 mg/dL — ABNORMAL HIGH (ref 0.44–1.00)
GFR, Estimated: 28 mL/min — ABNORMAL LOW (ref >60)
Glucose, Bld: 144 mg/dL — ABNORMAL HIGH (ref 70–99)
Phosphorus: 6.6 mg/dL — ABNORMAL HIGH (ref 2.5–4.6)
Potassium: 4.1 mmol/L (ref 3.5–5.1)
Sodium: 134 mmol/L — ABNORMAL LOW (ref 135–145)

## 2022-06-29 LAB — COMPREHENSIVE METABOLIC PANEL
ALT: 30 U/L (ref 0–44)
AST: 98 U/L — ABNORMAL HIGH (ref 15–41)
Albumin: 2.2 g/dL — ABNORMAL LOW (ref 3.5–5.0)
Alkaline Phosphatase: 142 U/L — ABNORMAL HIGH (ref 38–126)
Anion gap: 20 — ABNORMAL HIGH (ref 5–15)
BUN: 149 mg/dL — ABNORMAL HIGH (ref 6–20)
CO2: 17 mmol/L — ABNORMAL LOW (ref 22–32)
Calcium: 8.2 mg/dL — ABNORMAL LOW (ref 8.9–10.3)
Chloride: 98 mmol/L (ref 98–111)
Creatinine, Ser: 3.11 mg/dL — ABNORMAL HIGH (ref 0.44–1.00)
GFR, Estimated: 17 mL/min — ABNORMAL LOW (ref >60)
Glucose, Bld: 164 mg/dL — ABNORMAL HIGH (ref 70–99)
Potassium: 5 mmol/L (ref 3.5–5.1)
Sodium: 135 mmol/L (ref 135–145)
Total Bilirubin: 3.3 mg/dL — ABNORMAL HIGH (ref 0.3–1.2)
Total Protein: 5.7 g/dL — ABNORMAL LOW (ref 6.5–8.1)

## 2022-06-29 LAB — CBC WITH DIFFERENTIAL/PLATELET
Abs Immature Granulocytes: 0 10*3/uL (ref 0.00–0.07)
Basophils Absolute: 0 10*3/uL (ref 0.0–0.1)
Basophils Relative: 0 %
Eosinophils Absolute: 0 10*3/uL (ref 0.0–0.5)
Eosinophils Relative: 0 %
HCT: 24.3 % — ABNORMAL LOW (ref 36.0–46.0)
Hemoglobin: 7.9 g/dL — ABNORMAL LOW (ref 12.0–15.0)
Lymphocytes Relative: 0 %
Lymphs Abs: 0 10*3/uL — ABNORMAL LOW (ref 0.7–4.0)
MCH: 36.9 pg — ABNORMAL HIGH (ref 26.0–34.0)
MCHC: 32.5 g/dL (ref 30.0–36.0)
MCV: 113.6 fL — ABNORMAL HIGH (ref 80.0–100.0)
Monocytes Absolute: 0 10*3/uL — ABNORMAL LOW (ref 0.1–1.0)
Monocytes Relative: 0 %
Neutro Abs: 42 10*3/uL — ABNORMAL HIGH (ref 1.7–7.7)
Neutrophils Relative %: 100 %
Platelets: 172 10*3/uL (ref 150–400)
RBC: 2.14 MIL/uL — ABNORMAL LOW (ref 3.87–5.11)
RDW: 20.6 % — ABNORMAL HIGH (ref 11.5–15.5)
WBC: 42 10*3/uL — ABNORMAL HIGH (ref 4.0–10.5)
nRBC: 0 /100 WBC
nRBC: 0.3 % — ABNORMAL HIGH (ref 0.0–0.2)

## 2022-06-29 LAB — GLUCOSE, CAPILLARY
Glucose-Capillary: 107 mg/dL — ABNORMAL HIGH (ref 70–99)
Glucose-Capillary: 126 mg/dL — ABNORMAL HIGH (ref 70–99)
Glucose-Capillary: 127 mg/dL — ABNORMAL HIGH (ref 70–99)
Glucose-Capillary: 133 mg/dL — ABNORMAL HIGH (ref 70–99)
Glucose-Capillary: 158 mg/dL — ABNORMAL HIGH (ref 70–99)
Glucose-Capillary: 166 mg/dL — ABNORMAL HIGH (ref 70–99)
Glucose-Capillary: 182 mg/dL — ABNORMAL HIGH (ref 70–99)

## 2022-06-29 LAB — PATHOLOGIST SMEAR REVIEW

## 2022-06-29 MED ORDER — VANCOMYCIN HCL 750 MG/150ML IV SOLN
750.0000 mg | INTRAVENOUS | Status: DC
  Administered 2022-06-29 – 2022-07-01 (×3): 750 mg via INTRAVENOUS
  Filled 2022-06-29 (×5): qty 150

## 2022-06-29 MED ORDER — PRISMASOL BGK 0/2.5 32-2.5 MEQ/L EC SOLN
Status: DC
  Filled 2022-06-29 (×10): qty 5000

## 2022-06-29 MED ORDER — PRISMASOL BGK 4/2.5 32-4-2.5 MEQ/L EC SOLN
Status: DC
  Filled 2022-06-29 (×42): qty 5000

## 2022-06-29 MED ORDER — HEPARIN SODIUM (PORCINE) 1000 UNIT/ML DIALYSIS
1000.0000 [IU] | INTRAMUSCULAR | Status: DC | PRN
  Administered 2022-07-03: 2800 [IU] via INTRAVENOUS_CENTRAL
  Filled 2022-06-29: qty 3
  Filled 2022-06-29: qty 6

## 2022-06-29 MED ORDER — PRISMASOL BGK 4/2.5 32-4-2.5 MEQ/L REPLACEMENT SOLN
Status: DC
  Filled 2022-06-29 (×20): qty 5000

## 2022-06-29 MED ORDER — PRISMASOL BGK 4/2.5 32-4-2.5 MEQ/L REPLACEMENT SOLN
Status: DC
  Filled 2022-06-29 (×11): qty 5000

## 2022-06-29 MED ORDER — LEVETIRACETAM 500 MG PO TABS
1000.0000 mg | ORAL_TABLET | Freq: Two times a day (BID) | ORAL | Status: DC
  Administered 2022-06-29 – 2022-07-03 (×8): 1000 mg
  Filled 2022-06-29 (×8): qty 2

## 2022-06-29 MED ORDER — SODIUM CHLORIDE 0.9 % FOR CRRT: INTRAVENOUS_CENTRAL | Status: DC | PRN

## 2022-06-29 MED ORDER — SODIUM CHLORIDE 0.9 % IV SOLN
2.0000 g | Freq: Two times a day (BID) | INTRAVENOUS | Status: DC
  Administered 2022-06-29 – 2022-07-03 (×9): 2 g via INTRAVENOUS
  Filled 2022-06-29 (×12): qty 40

## 2022-06-29 NOTE — Progress Notes (Signed)
LTM EEG discontinued - no skin breakdown at unhook.   

## 2022-06-29 NOTE — Plan of Care (Signed)
Neurology plan of care  Deanna Mcmillan is a 51 y.o. female admitted with :   Seizures in the setting of possible Listeria meningitis and hyperammonemia, AKI, uremia, resolved on Keppra   Toxic/metabolic encephalopathy in the setting of hyperammonemia, AKI with uremia, septic shock   Septic shock on Levophed (MSSA bacteremia, MSSE bacteremia (which may be a contaminant), Listeria bacteremia, Listeria meningitis vs. CSF contamination with blood, appreciate ID following), improving   Nonoliguric AKI likely secondary to ATN associated with shock, appreciate nephrology following   Alcoholic cirrhosis vs. Potential for autoimmune component    Ileus/concern for partial small bowel obstruction   Acute respiratory failure due to shock AKI and encephalopathy   Prolonged Qtc   Chronic anemia.   She had prolonged GTC and left gaze deviation towards the end of her first hemodialysis session 12/5. Suspect that the etiology of seizure was likely keppra removal during HD. Seizure spontaneously resolved with no further clinical evidence of seizure. EEG x48 hrs no electrographic seizures. Patient is now being transitioned to CRRT/  Recommendations: - Increase keppra to 1000mg  IV BID while patient is on CRRT - D/c LTM - Neurology will be available as needed going forward.  Su Monks, MD [facility] [phone]  If 7pm- 7am, please page neurology on call as listed in [place].

## 2022-06-29 NOTE — Progress Notes (Signed)
ATTESTATION & SIGNATURE   STAFF NOTE: I, Dr Ann Lions have personally reviewed patient's available data, including medical history, events of note, physical examination and test results as part of my evaluation. I have discussed with resident/NP and other care providers such as pharmacist, RN and RRT.  In addition,  I personally evaluated patient and elicited key findings of   S: Date of admit 05/23/2022 with LOS 31 for today 06/29/2022 : Deanna Mcmillan is  on vent  . No trach. Intubated. iHD x 1 earlier In week but now CRRT from today. PAtient DNR but full medical care.  Ongoing goals of care. 40% fio32. FEvers +. S/p paracentesis yeserday. wBC 42K  O:  Blood pressure (!) 132/56, pulse 96, temperature 98.6 F (37 C), resp. rate (!) 28, weight 78.7 kg, SpO2 95 %.   Jaundiced , critically ill looking patient COmatose EEG ongoing Ascites +   LABS    PULMONARY No results for input(s): "PHART", "PCO2ART", "PO2ART", "HCO3", "TCO2", "O2SAT" in the last 168 hours.  Invalid input(s): "PCO2", "PO2"  CBC Recent Labs  Lab 06/27/22 0423 06/28/22 0810 06/29/22 0532  HGB 8.5* 8.4* 7.9*  HCT 24.9* 25.4* 24.3*  WBC 23.4* 32.1* 42.0*  PLT 108* 137* 172    COAGULATION No results for input(s): "INR" in the last 168 hours.  CARDIAC  No results for input(s): "TROPONINI" in the last 168 hours. No results for input(s): "PROBNP" in the last 168 hours.   CHEMISTRY Recent Labs  Lab 06/23/22 1247 06/24/22 0259 06/25/22 0319 06/26/22 0205 06/27/22 0423 06/28/22 0810 06/29/22 0532  NA  --  142 138 141 134* 137 135  K 3.4* 3.6 4.1 4.1 3.7 4.3 5.0  CL  --  108 100 103 95* 98 98  CO2  --  16* 22 19* 20* 21* 17*  GLUCOSE  --  278* 226* 299* 211* 98 164*  BUN  --  131* 148* 168* 116* 133* 149*  CREATININE  --  2.70* 2.84* 3.26* 2.34* 2.77* 3.11*  CALCIUM  --  8.9 8.0* 8.2* 9.1 8.5* 8.2*  MG 2.6* 2.7* 2.4 2.7* 2.2  --   --   PHOS  --  2.8 4.3 6.6* 6.6*  --   --    Estimated  Creatinine Clearance: 21.5 mL/min (A) (by C-G formula based on SCr of 3.11 mg/dL (H)).   LIVER Recent Labs  Lab 06/23/22 0259 06/24/22 0259 06/25/22 0319 06/28/22 0810 06/29/22 0532  AST 80* 97* 98* 80* 98*  ALT 23 26 30 30 30   ALKPHOS 105 120 105 105 142*  BILITOT 3.4* 3.4* 3.4* 3.1* 3.3*  PROT 6.4* 6.1* 5.7* 5.6* 5.7*  ALBUMIN 2.6* 2.6* 2.4* 2.4* 2.2*     INFECTIOUS Recent Labs  Lab 06/28/22 1134 06/28/22 1434  LATICACIDVEN 2.1* 1.7     ENDOCRINE CBG (last 3)  Recent Labs    06/28/22 2347 06/29/22 0409 06/29/22 0805  GLUCAP 182* 166* 158*         IMAGING x24h  - image(s) personally visualized  -   highlighted in bold DG Abd 1 View  Result Date: 06/28/2022 CLINICAL DATA:  Hypoxic respiratory failure EXAM: ABDOMEN - 1 VIEW COMPARISON:  Portable exam 1155 hours compared to 06/26/2022 FINDINGS: Tip of feeding tube projects over gastric antrum. Probe tip projects over GE junction region. RIGHT femoral line tip projects over RIGHT pelvis. Enlargement of cardiac silhouette. Persistent atelectasis versus consolidation LEFT lower lobe. Nonobstructive bowel gas pattern. Small amount of contrast opacification  within the stomach. No acute osseous findings. IMPRESSION: Persistent atelectasis versus consolidation LEFT lower lobe. Electronically Signed   By: Lavonia Dana M.D.   On: 06/28/2022 14:34   DG CHEST PORT 1 VIEW  Result Date: 06/28/2022 CLINICAL DATA:  Hypoxic respiratory failure EXAM: PORTABLE CHEST 1 VIEW COMPARISON:  Portable exam 1154 hours compared to 06/27/2022 FINDINGS: Tip of endotracheal tube projects 3.1 cm above carina. Feeding tube extends into stomach. Probe tip at GE junction. RIGHT arm PICC line tip projects over RIGHT atrium. Enlargement of cardiac silhouette. Atelectasis versus consolidation of LEFT lower lobe persists. Overall low lung volumes. No gross pleural effusion or pneumothorax. IMPRESSION: Persistent atelectasis versus consolidation LEFT lower  lobe. Electronically Signed   By: Lavonia Dana M.D.   On: 06/28/2022 14:33      A: Acute resp failure Hepatic failure Listeria Acute encephalopathy AKI  P: continue full ICU supportive care   Anti-infectives (From admission, onward)    Start     Dose/Rate Route Frequency Ordered Stop   06/29/22 1800  meropenem (MERREM) 500 mg in sodium chloride 0.9 % 100 mL IVPB  Status:  Discontinued        500 mg 200 mL/hr over 30 Minutes Intravenous Every 24 hours 06/28/22 1136 06/29/22 0744   06/29/22 1200  vancomycin (VANCOREADY) IVPB 750 mg/150 mL        750 mg 150 mL/hr over 60 Minutes Intravenous Every 24 hours 06/29/22 0744     06/29/22 1000  meropenem (MERREM) 2 g in sodium chloride 0.9 % 100 mL IVPB        2 g 280 mL/hr over 30 Minutes Intravenous Every 12 hours 06/29/22 0744     06/28/22 2205  vancomycin variable dose per unstable renal function (pharmacist dosing)  Status:  Discontinued         Does not apply See admin instructions 06/28/22 2205 06/29/22 0744   06/28/22 1230  meropenem (MERREM) 500 mg in sodium chloride 0.9 % 100 mL IVPB        500 mg 200 mL/hr over 30 Minutes Intravenous  Once 06/28/22 1136 06/28/22 1339   06/28/22 1215  vancomycin (VANCOREADY) IVPB 1750 mg/350 mL        1,750 mg 175 mL/hr over 120 Minutes Intravenous  Once 06/28/22 1129 06/28/22 1353   06/28/22 1215  piperacillin-tazobactam (ZOSYN) IVPB 2.25 g  Status:  Discontinued        2.25 g 100 mL/hr over 30 Minutes Intravenous Every 8 hours 06/28/22 1129 06/28/22 1134   06/27/22 2200  ampicillin (OMNIPEN) 2 g in sodium chloride 0.9 % 100 mL IVPB  Status:  Discontinued        2 g 300 mL/hr over 20 Minutes Intravenous Every 12 hours 06/27/22 0734 06/28/22 1129   06/25/22 2200  ampicillin (OMNIPEN) 2 g in sodium chloride 0.9 % 100 mL IVPB  Status:  Discontinued        2 g 300 mL/hr over 20 Minutes Intravenous Every 8 hours 06/25/22 1110 06/27/22 0734   06/22/22 1000  meropenem (MERREM) 1 g in sodium  chloride 0.9 % 100 mL IVPB  Status:  Discontinued        1 g 200 mL/hr over 30 Minutes Intravenous Every 12 hours 06/22/22 0911 06/25/22 1110   06/20/22 0600  ceFAZolin (ANCEF) IVPB 2g/100 mL premix  Status:  Discontinued        2 g 200 mL/hr over 30 Minutes Intravenous Every 8 hours 06/15/22 1343 06/18/22 1632  06/19/22 1000  meropenem (MERREM) 2 g in sodium chloride 0.9 % 100 mL IVPB  Status:  Discontinued        2 g 280 mL/hr over 30 Minutes Intravenous Every 12 hours 06/19/22 0852 06/22/22 0911   06/18/22 2000  meropenem (MERREM) 1 g in sodium chloride 0.9 % 100 mL IVPB  Status:  Discontinued        1 g 200 mL/hr over 30 Minutes Intravenous Every 12 hours 06/18/22 1632 06/19/22 0852   06/17/22 1400  ampicillin (OMNIPEN) 2 g in sodium chloride 0.9 % 100 mL IVPB  Status:  Discontinued        2 g 300 mL/hr over 20 Minutes Intravenous Every 8 hours 06/17/22 0731 06/18/22 1632   06/17/22 1100  rifaximin (XIFAXAN) tablet 550 mg        550 mg Per Tube 2 times daily 06/17/22 1000     06/16/22 1200  ampicillin (OMNIPEN) 2 g in sodium chloride 0.9 % 100 mL IVPB  Status:  Discontinued        2 g 300 mL/hr over 20 Minutes Intravenous Every 6 hours 06/16/22 0813 06/17/22 0731   06/15/22 1430  linezolid (ZYVOX) IVPB 600 mg  Status:  Discontinued        600 mg 300 mL/hr over 60 Minutes Intravenous Every 12 hours 06/15/22 1339 06/18/22 1632   06/12/22 2000  gentamicin (GARAMYCIN) IVPB 60 mg        60 mg 100 mL/hr over 30 Minutes Intravenous Every 24 hours 06/12/22 1146 06/15/22 2209   06/11/22 1230  rifaximin (XIFAXAN) tablet 550 mg  Status:  Discontinued        550 mg Per Tube 2 times daily 06/11/22 1135 06/16/22 0803   06/08/22 1845  gentamicin (GARAMYCIN) 20 mg in dextrose 5 % 50 mL IVPB  Status:  Discontinued        20 mg 101 mL/hr over 30 Minutes Intravenous Every 12 hours 06/08/22 0753 06/12/22 1146   06/05/22 1845  gentamicin (GARAMYCIN) 40 mg in dextrose 5 % 50 mL IVPB  Status:   Discontinued        40 mg 102 mL/hr over 30 Minutes Intravenous Every 12 hours 06/05/22 0801 06/08/22 0753   06/02/22 1515  ceFAZolin (ANCEF) IVPB 2g/100 mL premix        2 g 200 mL/hr over 30 Minutes Intravenous Every 8 hours 06/02/22 1428 06/13/22 2154   06/01/22 1842  gentamicin (GARAMYCIN) IVPB 80 mg  Status:  Discontinued        80 mg 100 mL/hr over 30 Minutes Intravenous Every 12 hours 06/01/22 0800 06/05/22 0801   06/01/22 1205  vancomycin (VANCOCIN) IVPB 1000 mg/200 mL premix  Status:  Discontinued        1,000 mg 200 mL/hr over 60 Minutes Intravenous Every 12 hours 06/01/22 0839 06/02/22 1428   05/30/22 2300  vancomycin (VANCOREADY) IVPB 1250 mg/250 mL  Status:  Discontinued        1,250 mg 166.7 mL/hr over 90 Minutes Intravenous Every 24 hours 06/06/2022 2313 06/01/22 0839   05/30/22 2000  cefTRIAXone (ROCEPHIN) 2 g in sodium chloride 0.9 % 100 mL IVPB  Status:  Discontinued        2 g 200 mL/hr over 30 Minutes Intravenous Every 24 hours 05/30/22 1417 05/30/22 1443   05/30/22 2000  cefTRIAXone (ROCEPHIN) 2 g in sodium chloride 0.9 % 100 mL IVPB  Status:  Discontinued        2 g  200 mL/hr over 30 Minutes Intravenous Every 12 hours 05/30/22 1443 06/02/22 1428   05/30/22 1800  metroNIDAZOLE (FLAGYL) IVPB 500 mg  Status:  Discontinued        500 mg 100 mL/hr over 60 Minutes Intravenous Every 12 hours 05/30/22 1726 05/31/22 1155   05/30/22 1500  gentamicin (GARAMYCIN) 130 mg in dextrose 5 % 50 mL IVPB  Status:  Discontinued        5 mg/kg/day  77.1 kg 106.5 mL/hr over 30 Minutes Intravenous Every 8 hours 05/30/22 1359 06/01/22 0800   05/30/22 1330  ampicillin (OMNIPEN) 2 g in sodium chloride 0.9 % 100 mL IVPB  Status:  Discontinued        2 g 300 mL/hr over 20 Minutes Intravenous Every 4 hours 05/30/22 1238 06/16/22 0813   05/30/22 0500  ceFEPIme (MAXIPIME) 2 g in sodium chloride 0.9 % 100 mL IVPB  Status:  Discontinued        2 g 200 mL/hr over 30 Minutes Intravenous Every 8  hours 06/17/2022 2313 05/30/22 1417   05/24/2022 2000  vancomycin (VANCOREADY) IVPB 1750 mg/350 mL        1,750 mg 175 mL/hr over 120 Minutes Intravenous  Once 06/16/2022 1952 05/30/22 0128   05/24/2022 2000  ceFEPIme (MAXIPIME) 2 g in sodium chloride 0.9 % 100 mL IVPB  Status:  Discontinued        2 g 200 mL/hr over 30 Minutes Intravenous  Once 06/08/2022 1952 06/16/2022 2003   05/30/2022 1945  ceFEPIme (MAXIPIME) 2 g in sodium chloride 0.9 % 100 mL IVPB        2 g 200 mL/hr over 30 Minutes Intravenous  Once 06/05/2022 1938 06/19/2022 2126   06/21/2022 1945  metroNIDAZOLE (FLAGYL) IVPB 500 mg        500 mg 100 mL/hr over 60 Minutes Intravenous  Once 06/16/2022 1938 06/11/2022 2322   06/13/2022 1945  vancomycin (VANCOCIN) IVPB 1000 mg/200 mL premix  Status:  Discontinued        1,000 mg 200 mL/hr over 60 Minutes Intravenous  Once 06/15/2022 1938 06/03/2022 2003        Rest per NP/medical resident whose note is outlined above and that I agree with  The patient is critically ill with multiple organ systems failure and requires high complexity decision making for assessment and support, frequent evaluation and titration of therapies, application of advanced monitoring technologies and extensive interpretation of multiple databases.   Critical Care Time devoted to patient care services described in this note is  13  Minutes. This time reflects time of care of this signee Dr Brand Males. This critical care time does not reflect procedure time, or teaching time or supervisory time of PA/NP/Med student/Med Resident etc but could involve care discussion time     Dr. Brand Males, M.D., [facility].C.P Pulmonary and Critical Care Medicine Staff Physician [place] [facility] Pager: [phone], If no answer or between  15:00h - 7:00h: call [phone]  06/29/2022 10:43 AM

## 2022-06-29 NOTE — Progress Notes (Signed)
Palliative:  Chart reviewed. Visited at bedside - no family present. Received update from nurse.  For now, plan remains to continue with dialysis to see if offers any improvement, will have ongoing goals of care discussions next week.  Juel Burrow, DNP, AGNP-C Palliative Medicine Team Team Phone # [phone]  Pager # [phone]  NO CHARGE

## 2022-06-29 NOTE — Progress Notes (Signed)
NAME:  Deanna Mcmillan, MRN:  [id number], DOB:  Dec 04, 1970, LOS: 38 ADMISSION DATE:  06/17/2022, CONSULTATION DATE:  11/7 REFERRING MD:  Lendon Colonel, EDP CHIEF COMPLAINT:  AMS   History of Present Illness:  51 yo woman with hx of recent diagnosis of alcoholic hepatitis (dx 66/06), here with ams, fever.  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 Seizure-EEG neg Started on pulse steroids per neuro for autoimmune cause    Recently seen at [facility] 10/19: LE Edema. Lasix not helpful at home.  Admitted for diuresis.  Jaundice and abd distension also noted.  Pleural effusion.  GI saw: non enough ascites for tap  Started lasix and spironolactone.  28 day course of prednisolone, miralax, lactulose, miralax. Midodrine TID     Pertinent  Medical History  ETOH Cirrhosis BLE edema recently worsening  S/p lap appy 2014    Echo: recent normal 30/1601   Home meds: folic ascid, acetaminohpen, lasix 40bid, lactulose 30 TID protonix, prednisone (04/29/22), prednisolone 04/28/22 three week course, thiamine, simethicone,    Per her mother she had been drinking at least one bottle of wine daily  Significant Hospital Events: Including procedures, antibiotic start and stop dates in addition to other pertinent events   11/7: Intubated in ED and admitted to ICU 11/7: CTH neg, CT A/P cirrhosis with portal hypertension, splenomegaly, moderate volume ascites, moderate bilateral pleural effusion Blood culture - Listeria and MSSA and MSSE (per mom health dept thinks she got it at [facility] but patient does love cheese) Urine culture - Pan sensitive E colii 11/8 cutlures Trach - MSSA Blood - neg 11/9 KUB: No ileus 11/10: LP confirms listeria meningitis 11/11 KUB: ileus 11/12 ileus resolved with aggressive bowel regimen 11/15 KUB:  No ileus. 11/18 more awake, following commands 1121 RUE PICC 11/22 extubated to BiPAP 11/23 - 14d of Rx complerted for MSSA/MESSE. For listeria - ID wants to extend amp/gent to 3 weeks 11/8-11/29. Ileus post extubation and NG to LIS . [place] increased. BP sofft an dlevophed and alb Rx.   11/24 -  OG placed yesterday for ileus and bile returns. TF on hold. Lactulose increased yesterday. REamins off vent. Did not need BiPAP. This AM more obtunded but mom thinks was awake at night and is sleeping due to fatigue. Afebrile.  But wBC up 13L. Neg baklance -3L:  OFF PRESSORs = Ammonia up at 72 - [place] mild periphepatic ascites Blood culture  11/25 - Now intubated due to obtunded encephaopathy. CTAbc with SBO v Ileus with transition ponit . CCS consult - no surgery but ok for rectal lactulose w ith continue NG/OG tube to LIS. ID ordered repeat blood culture. Linezolid added.  No ntrition since 06/14/22 Pm. Creat worsening. LEss Ur OP. On levophed 86mcg. Not on sedation. Vent 40% Ammonia 108 Trach aspirate 11/27 - Remains intubated. On propofol 38mcg and levophed 60mcg. Febrile up to 101.20F overnight. On ampicillin and linezolid, going for IR guided LP today. Kidney function improving, repleting potassium. No seizure activity noted overnight on EEG. Surgery following for ileus, no need for surgical intervention. Holding TF for today.  11/29 - started on pulse dose steroids, getting autoimmune workup, CSF studies 12/3 - finished steroids, workup negative thus far 12/7 - paracentesis 12/8 - CRRT started Interim History / Subjective:   Remains encephalopathic. Febrile to 101.62F overnight, worsening leukocytosis.  Paracentesis performed yesterday, without any notable findings thus far. On empiric vancomycin and merrem.   Starting CRRT today per nephrology.  Objective   Blood pressure 128/62, pulse (!) 111, temperature 99.9 F (37.7 C), resp. rate (!) 30, weight 78.7 kg, SpO2 92 %.    Vent Mode: PRVC FiO2  (%):  [40 %] 40 % Set Rate:  [20 bmp] 20 bmp Vt Set:  [430 mL] 430 mL PEEP:  [5 cmH20] 5 cmH20 Pressure Support:  [8 cmH20] 8 cmH20 Plateau Pressure:  [15 cmH20-20 cmH20] 15 cmH20   Intake/Output Summary (Last 24 hours) at 06/28/2022 1514 Last data filed at 06/28/2022 1400 Gross per 24 hour  Intake 2655.08 ml  Output 660 ml  Net 1995.08 ml   Filed Weights   06/26/22 0216 06/26/22 1715 06/26/22 2031  Weight: 67.2 kg 79.2 kg 78.7 kg   General: critically ill-appearing middle aged female, laying in bed, on mechanical ventilation and CRRT, NAD. Eyes: PERRL, scleral icterus present. CV: tachycardic rate and regular rhythm, no murmurs. Pulm: rhonchi noted bilaterally, on full ventilatory support.  GI: soft, mildly distended, normoactive bowel sounds.  MSK: anasarca. Skin: warm and dry. Extensive bruising again noted. Neuro: RASS -3, not following commands, opens eyes spontaneously but not to command. No withdrawal to noxious stimuli in distal extremities. Profound limb weakness with areflexia.   Resolved Hospital Problem list   Hypokalemia Hypophosphatemia Hypernatremia Thrombocytopenia Ileus MSSA bacteremia Listeria bacteremia Klebsiella HAP Circulatory Shock   Assessment & Plan:   Toxic Metabolic Encephalopathy Likely multifactorial 2/2 cirrhosis with ascites, GTC seizures, uremia, and listeria meningoencephalitis. This has persisted for over a week now and day-to-day exams have remained essentially unchanged. [place] discussions have been held with mother, will try to see if any improvement occurs with resolution of uremia via CRRT. If no improvement, family appears to be open to transitioning to comfort-focused care. Will need to organize family meeting, likely next week.  -appreciate neurology, ID, nephrology, and palliative care assistance  Fevers Leukocytosis Listeria meningoencephalitis Patient with fevers essentially on a daily basis and with worsening leukocytosis. Has  been on a long course of antibiotics (including broad spectrum) since admission. Listeria bacteremia and MSSA bacteremia have been treated per ID. Per ID, listeria CNS disease can remain and treatment can be up to 6 weeks (currently on week 4 of treatment).  -appreciate ID assistance -transitioned to vancomycin and merrem for empiric coverage -can transition back to ampicillin if negative workup -will need TEE once extubated -trend wbc and fever curve -f/u finalized blood cultures (no growth for past day) -f/u finalized respiratory cultures -f/u finalized ascitic fluid cultures (no growth for past day)  Cirrhosis with ascites [MELD-Na 27, Child Pugh Class C] Cirrhosis present on admission. Received paracentesis 12/7, no elevated neutrophil count and cultures no growth for the past day. Independently a poor prognostic indicator for overall outcome. -lactulose daily -rifaximin BID  GTC Seizures (11/26, 12/5) Has had 2 GTC seizures in-hospital. No seizure activity captured on LTM x2. -appreciate neurology assistance -keppra increased to 1g BID with CRRT -discontinue LTM per neurology  Acute hypoxic respiratory failure  Given lack of ability to wean, anticipate that patient may need ventilatory support for weeks to months. If plan remains to continue full scope of care, will need trach placement this week as she has remained on mechanical ventilation for over 3 weeks altogether. Discussed with mother.  -extubated 11/22, reintubated 11/24 -does not yet meet criteria for SBT/extubation given shock, AKI, and encephalopathy  Acute  Renal Failure (Baseline Cr 0.6- 0.9)  Severe Uremia Metabolic acidosis Anasarca If no improvement in neurological status with improvement in uremia, will need to readdress [place] discussions. -appreciate nephrology management -starting CRRT for metabolic clearance -trend kidney function -avoid nephrotoxic meds  Hyperglycemia, controlled -resistant SSI -TF  coverage with 8u q4h -levemir 12u BID -goal BG 140-180  Severe physical deconditioning Protein caloric malnutrition, mild She has remained bedbound in the ICU for 1 month now, has profound neuromuscular weakness. Anticipate that she will need months of physical therapy IF she has any clinically significant neurological recovery moving forward. -TF at goal -She will need aggressive PT/OT (likely CIR) for strength training if clinically improves  Prolonged QTc interval  Hx of SVT - monitor - avoid QTc prolonging medications  Anemia - chronic (baseline hgb 8-9 in oct 2023) -trend hgb curve -transfuse if hgb <7 and/or hemodynamic instability   Best Practice (right click and "Reselect all SmartList Selections" daily)   Diet/type: tubefeeds  DVT prophylaxis: prophylactic heparin  GI prophylaxis: PPI Lines: Central line - HD cath R fem 06/26/22 Foley:  removed 06/26/22 Code Status:  full code Last date of multidisciplinary goals of care discussion [updated mother at bedside daily]    Virl Axe, MD 06/28/2022, 3:14 PM   LABS   CBC Recent Labs  Lab 06/26/22 0205 06/27/22 0423 06/28/22 0810  HGB 8.1* 8.5* 8.4*  HCT 25.0* 24.9* 25.4*  WBC 17.5* 23.4* 32.1*  PLT 102* 108* 137*    CHEMISTRY Recent Labs  Lab 06/23/22 1247 06/24/22 0259 06/25/22 0319 06/26/22 0205 06/27/22 0423 06/28/22 0810  NA  --  142 138 141 134* 137  K 3.4* 3.6 4.1 4.1 3.7 4.3  CL  --  108 100 103 95* 98  CO2  --  16* 22 19* 20* 21*  GLUCOSE  --  278* 226* 299* 211* 98  BUN  --  131* 148* 168* 116* 133*  CREATININE  --  2.70* 2.84* 3.26* 2.34* 2.77*  CALCIUM  --  8.9 8.0* 8.2* 9.1 8.5*  MG 2.6* 2.7* 2.4 2.7* 2.2  --   PHOS  --  2.8 4.3 6.6* 6.6*  --    Estimated Creatinine Clearance: 24.1 mL/min (A) (by C-G formula based on SCr of 2.77 mg/dL (H)).   LIVER Recent Labs  Lab 06/22/22 0304 06/23/22 0259 06/24/22 0259 06/25/22 0319 06/28/22 0810  AST 67* 80* 97* 98* 80*  ALT 21 23  26 30 30   ALKPHOS 89 105 120 105 105  BILITOT 4.0* 3.4* 3.4* 3.4* 3.1*  PROT 6.2* 6.4* 6.1* 5.7* 5.6*  ALBUMIN 2.7* 2.6* 2.6* 2.4* 2.4*    ENDOCRINE CBG (last 3)  Recent Labs    06/28/22 0327 06/28/22 0750 06/28/22 1118  GLUCAP 110* 121* 114*

## 2022-06-29 NOTE — Procedures (Signed)
EEG Procedure CPT/Type of Study: 81157; 24hr EEG with video Referring Provider: Agarwala Primary Neurological Diagnosis: seizure   History: This is a 51 yr old patient, undergoing an EEG to evaluate for seizures. Clinical State: disoriented   Technical Description:   The EEG was performed using standard setting per the guidelines of American Clinical Neurophysiology Society (ACNS).   A minimum of 21 electrodes were placed on scalp according to the International 10-20 or/and 10-10 Systems. Supplemental electrodes were placed as needed. Single EKG electrode was also used to detect cardiac arrhythmia. Patient's behavior was continuously recorded on video simultaneously with EEG. A minimum of 16 channels were used for data display. Each epoch of study was reviewed manually daily and as needed using standard referential and bipolar montages. Computerized quantitative EEG analysis (such as compressed spectral array analysis, trending, automated spike & seizure detection) were used as indicated.    Day 3: from 0730 06/28/22 to 0730 06/29/22   EEG Description: Overall Amplitude: Low/suppressed Predominant Frequency: The background activity showed low voltage 1-5Hz  activity Superimposed Frequencies: none The background was symmetric   Background Abnormalities: Generalized slowing: diffuse low voltage slowing as above Rhythmic or periodic pattern: No Epileptiform activity: Yes; some brief left central sharp waves in bursts during sleep overnight but without ictal evolution Electrographic seizures: no Events: no    Breach rhythm: no   Reactivity: Present with atypical arousal patterns   Stimulation procedures:  Hyperventilation: not done Photic stimulation: no change   Sleep Background: none   EKG: irregular rhythm   Impression: This was an abnormal continuous video EEG due to diffuse slowing with some rare left central sharp waves, indicative of an encephalopathy pattern with some mild  superimposed cortical irritability. No seizures were seen.

## 2022-06-29 NOTE — Progress Notes (Signed)
Subjective:  looks like they did not get to her last night-  set to run HD this AM-  no more obvious sz activity -  had a paracentesis of 2.5 liters. Mother said she had a horrible night-  was agitated with her breathing reeq more sedation.  Pt is now DNR after conversation with palliative   Objective Vital signs in last 24 hours: Vitals:   06/29/22 0345 06/29/22 0350 06/29/22 0400 06/29/22 0415  BP: (!) 134/57  (!) 134/59 (!) 142/63  Pulse: (!) 108  (!) 109 (!) 109  Resp: (!) 28  (!) 31 (!) 32  Temp: 100.2 F (37.9 C)  100.2 F (37.9 C) 100.2 F (37.9 C)  TempSrc:      SpO2: 93% 93% (!) 86% 93%  Weight:       Weight change:   Intake/Output Summary (Last 24 hours) at 06/29/2022 6213 Last data filed at 06/29/2022 0547 Gross per 24 hour  Intake 2149.44 ml  Output 350 ml  Net 1799.44 ml    Assessment/Plan:      Non-Oliguric AKI: Likely secondary to ATN associated with shock.   - Management of shock has not improved her renal function unfortunately-  since making urine not suspicious of obstruction-  urine seemed bland when checked prior-  recheck is same.  Agree that one would not be able to assess her MS with a BUN over 150.  I sat down and talked with the Mother.  Told her my concern that if we started dialysis unsure she would be able to come off and if she does not improve otherwise globally she really would not be a candidate to continue dialysis support-  realistically would be "stuck " in a hospital or LTACH for the rest of her life.  However, I see it from Moms POV-  pt is young and opens her eyes-  she wants to give her every chance so she wont feel guilty in the future.  I offered a 2 week trial of IHD to get her BUN down and to see if she has it in her to improve otherwise.  If her MS does not improve and she cannot wean from vent at that time-  need to reconsider the dialysis.  Mom seems to understand-  I have spoken with CCM resident to place non tunneled HD cath done on 12/5-   this will cement the idea that this is temporary.  One side note, she may need CRRT given her hypermetabolic state and volume status -  having issues getting BUN down with IHD-  I think might be best to put her on CRRT then we can normalize numbers faster and get a quicker resolution to this-  I have informed the mother    AMS/seizures: Using lactulose and rifaximin.  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  abx per primary team and ID   Acute hypoxic respiratory failure: Ventilatory support per primary team  Anemia-  have added ESA and check iron stores-  they seem adequate    Louis Meckel    Labs: Basic Metabolic Panel: Recent Labs  Lab 06/25/22 0319 06/26/22 0205 06/27/22 0423 06/28/22 0810 06/29/22 0532  NA 138 141 134* 137 135  K  4.1 4.1 3.7 4.3 5.0  CL 100 103 95* 98 98  CO2 22 19* 20* 21* 17*  GLUCOSE 226* 299* 211* 98 164*  BUN 148* 168* 116* 133* 149*  CREATININE 2.84* 3.26* 2.34* 2.77* 3.11*  CALCIUM 8.0* 8.2* 9.1 8.5* 8.2*  PHOS 4.3 6.6* 6.6*  --   --    Liver Function Tests: Recent Labs  Lab 06/25/22 0319 06/28/22 0810 06/29/22 0532  AST 98* 80* 98*  ALT 30 30 30   ALKPHOS 105 105 142*  BILITOT 3.4* 3.1* 3.3*  PROT 5.7* 5.6* 5.7*  ALBUMIN 2.4* 2.4* 2.2*   No results for input(s): "LIPASE", "AMYLASE" in the last 168 hours. Recent Labs  Lab 06/24/22 0259  AMMONIA 37*   CBC: Recent Labs  Lab 06/25/22 0319 06/26/22 0205 06/27/22 0423 06/28/22 0810 06/29/22 0532  WBC 20.8* 17.5* 23.4* 32.1* 42.0*  NEUTROABS  --   --   --   --  PENDING  HGB 7.8* 8.1* 8.5* 8.4* 7.9*  HCT 23.3* 25.0* 24.9* 25.4* 24.3*  MCV 111.5* 113.1* 111.2* 112.4* 113.6*  PLT 106* 102* 108* 137* 172    Cardiac Enzymes: No results for input(s): "CKTOTAL", "CKMB", "CKMBINDEX", "TROPONINI" in the last 168 hours. CBG: Recent Labs  Lab 06/28/22 1118 06/28/22 1510 06/28/22 2010 06/28/22 2347 06/29/22 0409  GLUCAP 114* 170* 160* 182* 166*    Iron Studies:  Recent Labs    06/28/22 0423  IRON 54  TIBC 221*  FERRITIN 777*   Studies/Results: DG Abd 1 View  Result Date: 06/28/2022 CLINICAL DATA:  Hypoxic respiratory failure EXAM: ABDOMEN - 1 VIEW COMPARISON:  Portable exam 1155 hours compared to 06/26/2022 FINDINGS: Tip of feeding tube projects over gastric antrum. Probe tip projects over GE junction region. RIGHT femoral line tip projects over RIGHT pelvis. Enlargement of cardiac silhouette. Persistent atelectasis versus consolidation LEFT lower lobe. Nonobstructive bowel gas pattern. Small amount of contrast opacification within the stomach. No acute osseous findings. IMPRESSION: Persistent atelectasis versus consolidation LEFT lower lobe. Electronically Signed   By: Lavonia Dana M.D.   On: 06/28/2022 14:34   DG CHEST PORT 1 VIEW  Result Date: 06/28/2022 CLINICAL DATA:  Hypoxic respiratory failure EXAM: PORTABLE CHEST 1 VIEW COMPARISON:  Portable exam 1154 hours compared to 06/27/2022 FINDINGS: Tip of endotracheal tube projects 3.1 cm above carina. Feeding tube extends into stomach. Probe tip at GE junction. RIGHT arm PICC line tip projects over RIGHT atrium. Enlargement of cardiac silhouette. Atelectasis versus consolidation of LEFT lower lobe persists. Overall low lung volumes. No gross pleural effusion or pneumothorax. IMPRESSION: Persistent atelectasis versus consolidation LEFT lower lobe. Electronically Signed   By: Lavonia Dana M.D.   On: 06/28/2022 14:33   Overnight EEG with video  Result Date: 06/27/2022 Deanna Cota, MD     06/27/2022  8:38 AM EEG Procedure CPT/Type of Study: 17001; 2-12hr EEG with video Referring Provider: Agarwala Primary Neurological Diagnosis: seizure History:  This is a 51 yr old patient, undergoing an EEG to evaluate for seizures. Clinical State: disoriented Technical Description: The EEG was performed using standard setting per the guidelines of American Clinical Neurophysiology Society (ACNS). A minimum of 21 electrodes were placed on scalp according to the International 10-20 or/and 10-10 Systems. Supplemental electrodes were placed as needed. Single EKG electrode was also used to detect cardiac arrhythmia. Patient's behavior was continuously recorded on video simultaneously with EEG. A minimum of 16 channels were used for data display. Each epoch of study was reviewed manually daily and  as needed using standard referential and bipolar montages. Computerized quantitative EEG analysis (such as compressed spectral array analysis, trending, automated spike & seizure detection) were used as indicated. Day 1: from 2300 06/26/22 to 0730 06/27/22 EEG Description: Overall Amplitude: Low/suppressed Predominant Frequency: The background activity showed low voltage 1-3Hz  activity rarely and frequent periods of voltage attenuation. Superimposed Frequencies: none The background was symmetric Background Abnormalities: Generalized slowing: diffuse low voltage slowing and attenuation as above Rhythmic or periodic pattern: No Epileptiform activity: no Electrographic seizures: no Events: no Breach rhythm: no Reactivity: Absent Stimulation procedures: Hyperventilation: not done Photic stimulation: no change Sleep Background: none EKG: irregular rhythm Impression: This was an abnormal continuous video EEG due to diffuse slowing with attenuated background, indicative of a severe sedated encephalopathy pattern. No seizures or epileptiform discharges were seen.   Medications: Infusions:  sodium chloride 10 mL/hr at 06/29/22 0100   anticoagulant sodium citrate     feeding supplement (VITAL 1.5 CAL) 55 mL/hr at 06/29/22 0100   lactated ringers 10 mL/hr at 06/29/22 0100   levETIRAcetam      meropenem (MERREM) IV      Scheduled Medications:  Chlorhexidine Gluconate Cloth  6 each Topical Daily   Chlorhexidine Gluconate Cloth  6 each Topical Q0600   darbepoetin (ARANESP) injection - DIALYSIS  150 mcg Subcutaneous Q Wed-1800   feeding supplement (PROSource TF20)  60 mL Per Tube Daily   folic acid  1 mg Per Tube Daily   free water  100 mL Per Tube Q6H   heparin  5,000 Units Subcutaneous Q8H   insulin aspart  0-20 Units Subcutaneous Q4H   insulin aspart  8 Units Subcutaneous Q4H   insulin detemir  12 Units Subcutaneous BID   lactulose  10 g Per Tube Daily   leptospermum manuka honey  1 Application Topical QHS   levETIRAcetam  500 mg Per Tube BID   midodrine  10 mg Per Tube Q8H   modafinil  100 mg Per Tube Daily   multivitamin  1 tablet Per Tube QHS   mouth rinse  15 mL Mouth Rinse Q2H   pantoprazole (PROTONIX) IV  40 mg Intravenous Q24H   rifaximin  550 mg Per Tube BID   sodium chloride flush  10-40 mL Intracatheter Q12H   thiamine  100 mg Per Tube Daily   vancomycin variable dose per unstable renal function (pharmacist dosing)   Does not apply See admin instructions    have reviewed scheduled and prn medications.  Physical Exam: General:  sedated on vent-  opening eyes today  Heart: tachy Lungs:mostly clear Abdomen: soft, non tender Extremities: pitting edema throughout Dialysis Access: femoral temp HD cath -  placed 12/5    06/29/2022,7:14 AM  LOS: 31 days

## 2022-06-29 NOTE — Progress Notes (Signed)
Pharmacy Antibiotic Note  MARKISHA MEDING is a 51 y.o. female admitted on 06/07/2022 with Listeria meningitis and has been on ampicillin monotherapy.  Now with fever and worsening leukocytosis.  Pharmacy consulted to broaden antibiotic to vancomycin and Merrem.  Patient has an AKI started on iHD on 12/5.  Now to transition to CRRT.  Tmax 101.1, WBC up 42 .  Plan: Schedule vanc 750mg  IV Q24H Merrem 2gm IV Q12H Monitor CRRT tolerance/interruption, seizure activity, clinical progress, vanc levels as indicated  Weight: 78.7 kg (173 lb 8 oz)  Temp (24hrs), Avg:100.3 F (37.9 C), Min:98.1 F (36.7 C), Max:101.1 F (38.4 C)  Recent Labs  Lab 06/25/22 0319 06/26/22 0205 06/27/22 0423 06/28/22 0810 06/28/22 1134 06/28/22 1434 06/29/22 0532  WBC 20.8* 17.5* 23.4* 32.1*  --   --  42.0*  CREATININE 2.84* 3.26* 2.34* 2.77*  --   --  3.11*  LATICACIDVEN  --   --   --   --  2.1* 1.7  --      Estimated Creatinine Clearance: 21.5 mL/min (A) (by C-G formula based on SCr of 3.11 mg/dL (H)).    No Known Allergies   Vanc 11/7 >> 11/11, 12/7 >> CFP 11/7 >> 11/8  Flagyl 11/7 >> 11/9  CTX 11/8 >>11/11 Ampicillin 11/8 >>11/27 Gent 11/8 >> 11/24 Ancef 11/11 >>11/22 Linezolid 11/24 >> 11/27 Cefazolin 11/29 >>11/27 Meropenem 11/27>12/4, 12/7 >>  Ampicillin 12/4 >> 12/7   11/7 BCx - Listeria, Staph aureus, staph epi via BCID  11/7 UCx > E.coli pan sens 11/8 para cx - negative  11/8 BCx - negative 11/8 TA - MSSA 11/10 listeria in blood and CSF 11/24 bcx: neg 11.25 TA: rare kleb oxy (ESBL) 11/27 csf - neg 11/29 CSF - neg   11/10 GP/GT 6.9/4.3 >> decr to 80mg  q12 11/13-11/14: GP/GT 5.2/1.6 >> decr to 40mg  q12 11/17 GP/GT 6.3/1.1 >> decr  to 20mg  q12 11/20-11/21 GP/GT 1.4/<0.5 >> inc to 60 mg q24h  Justn Quale D. Mina Marble, PharmD, BCPS, [place] 06/29/2022, 7:41 AM

## 2022-06-29 NOTE — Progress Notes (Signed)
vLTM maintenance   All impedances below 10kohms.  No skin breakdown noted at all skin sites 

## 2022-06-30 DIAGNOSIS — J9601 Acute respiratory failure with hypoxia: Secondary | ICD-10-CM | POA: Diagnosis not present

## 2022-06-30 DIAGNOSIS — A3211 Listerial meningitis: Secondary | ICD-10-CM | POA: Diagnosis not present

## 2022-06-30 DIAGNOSIS — Z515 Encounter for palliative care: Secondary | ICD-10-CM | POA: Diagnosis not present

## 2022-06-30 DIAGNOSIS — Z7189 Other specified counseling: Secondary | ICD-10-CM | POA: Diagnosis not present

## 2022-06-30 LAB — RENAL FUNCTION PANEL
Albumin: 2.1 g/dL — ABNORMAL LOW (ref 3.5–5.0)
Anion gap: 15 (ref 5–15)
BUN: 72 mg/dL — ABNORMAL HIGH (ref 6–20)
CO2: 22 mmol/L (ref 22–32)
Calcium: 8.2 mg/dL — ABNORMAL LOW (ref 8.9–10.3)
Chloride: 101 mmol/L (ref 98–111)
Creatinine, Ser: 1.49 mg/dL — ABNORMAL HIGH (ref 0.44–1.00)
GFR, Estimated: 42 mL/min — ABNORMAL LOW (ref >60)
Glucose, Bld: 120 mg/dL — ABNORMAL HIGH (ref 70–99)
Phosphorus: 5 mg/dL — ABNORMAL HIGH (ref 2.5–4.6)
Potassium: 4.1 mmol/L (ref 3.5–5.1)
Sodium: 138 mmol/L (ref 135–145)

## 2022-06-30 LAB — GLUCOSE, CAPILLARY
Glucose-Capillary: 134 mg/dL — ABNORMAL HIGH (ref 70–99)
Glucose-Capillary: 120 mg/dL — ABNORMAL HIGH (ref 70–99)
Glucose-Capillary: 121 mg/dL — ABNORMAL HIGH (ref 70–99)
Glucose-Capillary: 116 mg/dL — ABNORMAL HIGH (ref 70–99)
Glucose-Capillary: 124 mg/dL — ABNORMAL HIGH (ref 70–99)
Glucose-Capillary: 118 mg/dL — ABNORMAL HIGH (ref 70–99)

## 2022-06-30 LAB — CBC
HCT: 22.1 % — ABNORMAL LOW (ref 36.0–46.0)
Hemoglobin: 7.4 g/dL — ABNORMAL LOW (ref 12.0–15.0)
MCH: 37.6 pg — ABNORMAL HIGH (ref 26.0–34.0)
MCHC: 33.5 g/dL (ref 30.0–36.0)
MCV: 112.2 fL — ABNORMAL HIGH (ref 80.0–100.0)
Platelets: 153 10*3/uL (ref 150–400)
RBC: 1.97 MIL/uL — ABNORMAL LOW (ref 3.87–5.11)
RDW: 21.2 % — ABNORMAL HIGH (ref 11.5–15.5)
WBC: 31.9 10*3/uL — ABNORMAL HIGH (ref 4.0–10.5)
nRBC: 0.2 % (ref 0.0–0.2)

## 2022-06-30 LAB — HEPATIC FUNCTION PANEL
ALT: 32 U/L (ref 0–44)
AST: 90 U/L — ABNORMAL HIGH (ref 15–41)
Albumin: 2.1 g/dL — ABNORMAL LOW (ref 3.5–5.0)
Alkaline Phosphatase: 165 U/L — ABNORMAL HIGH (ref 38–126)
Bilirubin, Direct: 1.6 mg/dL — ABNORMAL HIGH (ref 0.0–0.2)
Indirect Bilirubin: 1.3 mg/dL — ABNORMAL HIGH (ref 0.3–0.9)
Total Bilirubin: 2.9 mg/dL — ABNORMAL HIGH (ref 0.3–1.2)
Total Protein: 5.6 g/dL — ABNORMAL LOW (ref 6.5–8.1)

## 2022-06-30 LAB — MAGNESIUM: Magnesium: 2.4 mg/dL (ref 1.7–2.4)

## 2022-06-30 NOTE — Progress Notes (Signed)
NAME:  NAKEISHA GREENHOUSE, MRN:  [id number], DOB:  02/22/1971, LOS: 46 ADMISSION DATE:  06/21/2022, CONSULTATION DATE:  11/7 REFERRING MD:  Lendon Colonel, EDP CHIEF COMPLAINT:  AMS   BRIEF  51 yo woman with hx of recent diagnosis of alcoholic hepatitis (dx 56/81), here with ams, fever.  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 Seizure-EEG neg Started on pulse steroids per neuro for autoimmune cause    Recently seen at [facility] 10/19: LE Edema. Lasix not helpful at home.  Admitted for diuresis.  Jaundice and abd distension also noted.  Pleural effusion.  GI saw: non enough ascites for tap  Started lasix and spironolactone.  28 day course of prednisolone, miralax, lactulose, miralax. Midodrine TID     Pertinent  Medical History  ETOH Cirrhosis BLE edema recently worsening  S/p lap appy 2014    Echo: recent normal 27/5170   Home meds: folic ascid, acetaminohpen, lasix 40bid, lactulose 30 TID protonix, prednisone (04/29/22), prednisolone 04/28/22 three week course, thiamine, simethicone,    Per her mother she had been drinking at least one bottle of wine daily  Significant Hospital Events: Including procedures, antibiotic start and stop dates in addition to other pertinent events   11/7: Intubated in ED and admitted to ICU 11/7: CTH neg, CT A/P cirrhosis with portal hypertension, splenomegaly, moderate volume ascites, moderate bilateral pleural effusion Blood culture - Listeria and MSSA and MSSE (per mom health dept thinks she got it at [facility] but patient does love cheese) Urine culture - Pan sensitive E colii 11/8 cutlures Trach - MSSA Blood - neg 11/9 KUB: No ileus 11/10: LP confirms listeria meningitis 11/11 KUB: ileus 11/12 ileus resolved with aggressive bowel regimen 11/15 KUB: No ileus. 11/18 more  awake, following commands 1121 RUE PICC 11/22 extubated to BiPAP 11/23 - 14d of Rx complerted for MSSA/MESSE. For listeria - ID wants to extend amp/gent to 3 weeks 11/8-11/29. Ileus post extubation and NG to LIS . [place] increased. BP sofft an dlevophed and alb Rx.   11/24 -  OG placed yesterday for ileus and bile returns. TF on hold. Lactulose increased yesterday. REamins off vent. Did not need BiPAP. This AM more obtunded but mom thinks was awake at night and is sleeping due to fatigue. Afebrile.  But wBC up 13L. Neg baklance -3L:  OFF PRESSORs = Ammonia up at 72 - [place] mild periphepatic ascites Blood culture  11/25 - Now intubated due to obtunded encephaopathy. CTAbc with SBO v Ileus with transition ponit . CCS consult - no surgery but ok for rectal lactulose w ith continue NG/OG tube to LIS. ID ordered repeat blood culture. Linezolid added.  No ntrition since 06/14/22 Pm. Creat worsening. LEss Ur OP. On levophed 53mcg. Not on sedation. Vent 40% Ammonia 108 Trach aspirate -Klebsiella oxytoca 11/27 - Remains intubated. On propofol 76mcg and levophed 8mcg. Febrile up to 101.67F overnight. On ampicillin and linezolid, going for IR guided LP today. Kidney function improving, repleting potassium. No seizure activity noted overnight on EEG. Surgery following for ileus, no need for surgical intervention. Holding TF for today.  11/29 - started on pulse dose steroids, getting autoimmune workup, CSF studies 12/3 - finished steroids, workup negative thus far 12/7 -  Paracentesis -rare WBC no organisms seen Blood culture  Tracheal aspirate: Few budding yeast, few gram-positive cocci too young  to read 12/8 - CRRT started.  Remains encephalopathic. Febrile to 101.39F overnight, worsening leukocytosis. Paracentesis performed yesterday, without any notable findings thus far. On empiric vancomycin and merrem.  Starting CRRT today per nephrology. Interim History / Subjective:    12/9 -on the ventilator FiO2  40%.  Getting tube feeds.  On CRRT.  Not on pressors  afebrile since 06/28/2022.  White count reduced to 32 K.  Neurology signed off.  Discontinued LTM.  Keppra has been increased to 1000 mg twice daily during the duration of CRRT.  Anasarca has improved.  Mother at the bedside.   Blood pressure (!) 134/58, pulse 89, temperature 98.2 F (36.8 C), resp. rate (!) 23, weight 74.3 kg, SpO2 99 %.    Vent Mode: PRVC FiO2 (%):  [40 %] 40 % Set Rate:  [20 bmp] 20 bmp Vt Set:  [430 mL] 430 mL PEEP:  [5 cmH20] 5 cmH20 Plateau Pressure:  [15 cmH20] 15 cmH20   Intake/Output Summary (Last 24 hours) at 06/30/2022 0849 Last data filed at 06/30/2022 0800 Gross per 24 hour  Intake 2488.34 ml  Output 4822.6 ml  Net -2334.26 ml   Filed Weights   06/26/22 1715 06/26/22 2031 06/30/22 0127  Weight: 79.2 kg 78.7 kg 74.3 kg   General Appearance:  Looks criticall ill.  Extremely jaundiced.  Diffuse bruising present Head:  Normocephalic, without obvious abnormality, atraumatic Eyes:  PERRL -yes, conjunctiva/corneas -jaundice     Ears:  Normal external ear canals, both ears Nose:  G tube -yes   Throat:  ETT TUBE -yes, OG tube -no Neck:  Supple,  No enlargement/tenderness/nodules Lungs: Clear to auscultation bilaterally, Ventilator   Synchrony -yes Heart:  S1 and S2 normal, no murmur, CVP - x.  Pressors -no Abdomen:  Soft, no masses, no organomegaly Genitalia / Rectal:  Not done Extremities:  Extremities-intact Skin:  ntact in exposed areas . Sacral area -not examined Neurologic:  Sedation -none-> RASS --4. Moves all 4s -no. CAM-ICU -unable to test. Orientation -not oriented     Resolved Hospital Problem list   Hypokalemia Hypophosphatemia Hypernatremia Thrombocytopenia Ileus MSSA bacteremia Listeria bacteremia Klebsiella HAP Circulatory Shock   Assessment & Plan:   Toxic Metabolic Encephalopathy Likely multifactorial 2/2 cirrhosis with ascites, GTC seizures, uremia, and listeria  meningoencephalitis. This has persisted for over a week now and day-to-day exams have remained essentially unchanged. G-appreciate neurology, ID, nephrology, and palliative care assistance  06/30/2022: Continues to be comatose.  CRRT, Xifaxan and Lozol ongoing.  Neuro increase Keppra.  Neuro stopped LTM.  Neuro signed off   Plan -Supportive care  Fevers Leukocytosis Listeria meningoencephalitis Patient with fevers essentially on a daily basis and with worsening leukocytosis. Has been on a long course of antibiotics (including broad spectrum) since admission. Listeria bacteremia and MSSA bacteremia have been treated per ID. Per ID, listeria CNS disease can remain and treatment can be up to 6 weeks (currently on week 4 of treatment).    : Afebrile x 24 hours June 30, 2022  Plan -appreciate ID assistance -transitioned to vancomycin and merrem for empiric coverage -can transition back to ampicillin if negative workup -will need TEE once extubated -trend wbc and fever curve -f/u finalized blood cultures (no growth for past day) -f/u finalized respiratory cultures -f/u finalized ascitic fluid cultures (no growth for past day)  Cirrhosis with ascites [MELD-Na 27, Child Pugh Class C] Cirrhosis present on admission. Received paracentesis 12/7, no elevated neutrophil count and cultures no growth for the past day. Independently  a poor prognostic indicator for overall outcome.  06/30/2022: MELD 27  Plan -lactulose daily -rifaximin BID  GTC Seizures (11/26, 12/5) Has had 2 GTC seizures in-hospital. No seizure activity captured on LTM x2.  12/8 -  neuro increased keppra and stopped LTM  Plan -appreciate neurology assistance -keppra increased to 1g BID with CRRT -discontinue LTM per neurology  Acute hypoxic respiratory failure  Given lack of ability to wean, anticipate that patient may need ventilatory support for weeks to months. If plan remains to continue full scope of care, will need  trach placement this week as she has remained on mechanical ventilation for over 3 weeks altogether. Discussed with mother.  -extubated 11/22, reintubated 11/24  06/30/2022 - > does NOT meet criteria for SBT/Extubation in setting of Acute Respiratory Failure due to Coma  Plan  - PRVC   Acute Renal Failure (Baseline Cr 0.6- 0.9)  Severe Uremia Metabolic acidosis Anasarca  06/30/2022: On CRRT for metabolic clearance.  Anasarca has improved.  Plan  - per rena  Hyperglycemia, controlled -resistant SSI -TF coverage with 8u q4h -levemir 12u BID -goal BG 140-180  Severe physical deconditioning Protein caloric malnutrition, mild She has remained bedbound in the ICU for 1 month now, has profound neuromuscular weakness. Anticipate that she will need months of physical therapy IF she has any clinically significant neurological recovery moving forward. -TF at goal -She will need aggressive PT/OT (likely CIR) for strength training if clinically improves  Prolonged QTc interval  Hx of SVT - monitor - avoid QTc prolonging medications  Anemia - chronic (baseline hgb 8-9 in oct 2023) -trend hgb curve -transfuse if hgb <7 and/or hemodynamic instability   Best Practice (right click and "Reselect all SmartList Selections" daily)   Diet/type: tubefeeds  DVT prophylaxis: prophylactic heparin  GI prophylaxis: PPI Lines: Central line - HD cath R fem 06/26/22 Foley:  removed 06/26/22 Code Status:  full code Last date of multidisciplinary goals of care discussion [updated mother at bedside daily]  [place] discussions have been held with mother, will try to see if any improvement occurs with resolution of uremia via CRRT. If no improvement, family appears to be open to transitioning to comfort-focused care. Will need to organize family meeting, likely next week.   12-9/23: Discussed with mom again.  She is preparing for interdisciplinary family goals of care meeting week of 07/02/2022.  Explained  the concept of critical care illness.  Explained to her that LTAC tracheostomy will be required.  Explained to her that patients with cirrhosis and renal failure through the worst over the course of many months or year.  Mortality rates are very high.  Morbidity rate is extremely high.  She is going to reflect on all this.  She does not think this is the best interest of patient to go through tracheostomy and LTAC even though the patient is young.   ATTESTATION & SIGNATURE   The patient SHYANNA KLINGEL is critically ill with multiple organ systems failure and requires high complexity decision making for assessment and support, frequent evaluation and titration of therapies, application of advanced monitoring technologies and extensive interpretation of multiple databases and discussion with other appropriate health care personnel such as bedside nurses, social workers, case Freight forwarder, consultants, respiratory therapists, nutritionists, secretaries etc.,  Critical care time includes but is not restricted to just documentation time. Documentation can happen in parallel or sequential to care time depending on case mix urgency and priorities for the shift. So, overall critical Care Time devoted  to patient care services described in this note is  25  Minutes.   This time reflects time of care of this signee Dr Brand Males which includ does not reflect procedure time, or teaching time or supervisory time of PA/NP/Med student/Med Resident etc but could involve care discussion time     Dr. Brand Males, M.D., [facility].C.P Pulmonary and Critical Care Medicine Medical Director - [facility] ICU Staff Physician, [place] [facility] Pager: [phone], If no answer or between  15:00h - 7:00h: call [phone]  06/30/2022 9:14 AM    LABS    PULMONARY No results for input(s): "PHART", "PCO2ART", "PO2ART", "HCO3", "TCO2", "O2SAT" in the last 168  hours.  Invalid input(s): "PCO2", "PO2"  CBC Recent Labs  Lab 06/28/22 0810 06/29/22 0532 06/30/22 0125  HGB 8.4* 7.9* 7.4*  HCT 25.4* 24.3* 22.1*  WBC 32.1* 42.0* 31.9*  PLT 137* 172 153    COAGULATION No results for input(s): "INR" in the last 168 hours.  CARDIAC  No results for input(s): "TROPONINI" in the last 168 hours. No results for input(s): "PROBNP" in the last 168 hours.   CHEMISTRY Recent Labs  Lab 06/24/22 0259 06/25/22 0319 06/26/22 0205 06/27/22 0423 06/28/22 0810 06/29/22 0532 06/29/22 1631 06/30/22 0125  NA 142 138 141 134* 137 135 134* 138  K 3.6 4.1 4.1 3.7 4.3 5.0 4.1 4.1  CL 108 100 103 95* 98 98 97* 101  CO2 16* 22 19* 20* 21* 17* 20* 22  GLUCOSE 278* 226* 299* 211* 98 164* 144* 120*  BUN 131* 148* 168* 116* 133* 149* 105* 72*  CREATININE 2.70* 2.84* 3.26* 2.34* 2.77* 3.11* 2.07* 1.49*  CALCIUM 8.9 8.0* 8.2* 9.1 8.5* 8.2* 8.0* 8.2*  MG 2.7* 2.4 2.7* 2.2  --   --   --  2.4  PHOS 2.8 4.3 6.6* 6.6*  --   --  6.6* 5.0*   Estimated Creatinine Clearance: 43.6 mL/min (A) (by C-G formula based on SCr of 1.49 mg/dL (H)).   LIVER Recent Labs  Lab 06/24/22 0259 06/25/22 0319 06/28/22 0810 06/29/22 0532 06/29/22 1631 06/30/22 0125  AST 97* 98* 80* 98*  --  90*  ALT 26 30 30 30   --  32  ALKPHOS 120 105 105 142*  --  165*  BILITOT 3.4* 3.4* 3.1* 3.3*  --  2.9*  PROT 6.1* 5.7* 5.6* 5.7*  --  5.6*  ALBUMIN 2.6* 2.4* 2.4* 2.2* 2.2* 2.1*  2.1*    No results for input(s): "INR" in the last 168 hours.  INFECTIOUS Recent Labs  Lab 06/28/22 1134 06/28/22 1434  LATICACIDVEN 2.1* 1.7     ENDOCRINE CBG (last 3)  Recent Labs    06/29/22 2334 06/30/22 0405 06/30/22 0749  GLUCAP 126* 134* 120*         IMAGING x48h  - image(s) personally visualized  -   highlighted in bold DG Abd 1 View  Result Date: 06/28/2022 CLINICAL DATA:  Hypoxic respiratory failure EXAM: ABDOMEN - 1 VIEW COMPARISON:  Portable exam 1155 hours compared to  06/26/2022 FINDINGS: Tip of feeding tube projects over gastric antrum. Probe tip projects over GE junction region. RIGHT femoral line tip projects over RIGHT pelvis. Enlargement of cardiac silhouette. Persistent atelectasis versus consolidation LEFT lower lobe. Nonobstructive bowel gas pattern. Small amount of contrast opacification within the stomach. No acute osseous findings. IMPRESSION: Persistent atelectasis versus consolidation LEFT lower lobe. Electronically Signed   By:  Lavonia Dana M.D.   On: 06/28/2022 14:34   DG CHEST PORT 1 VIEW  Result Date: 06/28/2022 CLINICAL DATA:  Hypoxic respiratory failure EXAM: PORTABLE CHEST 1 VIEW COMPARISON:  Portable exam 1154 hours compared to 06/27/2022 FINDINGS: Tip of endotracheal tube projects 3.1 cm above carina. Feeding tube extends into stomach. Probe tip at GE junction. RIGHT arm PICC line tip projects over RIGHT atrium. Enlargement of cardiac silhouette. Atelectasis versus consolidation of LEFT lower lobe persists. Overall low lung volumes. No gross pleural effusion or pneumothorax. IMPRESSION: Persistent atelectasis versus consolidation LEFT lower lobe. Electronically Signed   By: Lavonia Dana M.D.   On: 06/28/2022 14:33

## 2022-06-30 NOTE — Progress Notes (Signed)
Daily Progress Note   Patient Name: Deanna Mcmillan       Date: 06/30/2022 DOB: 06-Dec-1970  Age: 51 y.o. MRN#: [id number] Attending Physician: Brand Males, MD Primary Care Physician: Elisabeth Cara, [place] Admit Date: 06/20/2022  Reason for Consultation/Follow-up: Establishing goals of care  Subjective: Intubated - does not wake to voice or gentle touch  Length of Stay: 32  Current Medications: Scheduled Meds:   Chlorhexidine Gluconate Cloth  6 each Topical Q0600   darbepoetin (ARANESP) injection - DIALYSIS  150 mcg Subcutaneous Q Wed-1800   feeding supplement (PROSource TF20)  60 mL Per Tube Daily   folic acid  1 mg Per Tube Daily   heparin  5,000 Units Subcutaneous Q8H   insulin aspart  0-20 Units Subcutaneous Q4H   insulin aspart  8 Units Subcutaneous Q4H   insulin detemir  12 Units Subcutaneous BID   lactulose  10 g Per Tube Daily   leptospermum manuka honey  1 Application Topical QHS   levETIRAcetam  1,000 mg Per Tube BID   midodrine  10 mg Per Tube Q8H   multivitamin  1 tablet Per Tube QHS   mouth rinse  15 mL Mouth Rinse Q2H   pantoprazole (PROTONIX) IV  40 mg Intravenous Q24H   rifaximin  550 mg Per Tube BID   sodium chloride flush  10-40 mL Intracatheter Q12H   thiamine  100 mg Per Tube Daily    Continuous Infusions:   prismasol BGK 4/2.5 400 mL/hr at 06/29/22 2315    prismasol BGK 4/2.5 400 mL/hr at 06/29/22 2315   sodium chloride 10 mL/hr at 06/30/22 0900   anticoagulant sodium citrate     feeding supplement (VITAL 1.5 CAL) 55 mL/hr at 06/30/22 0900   lactated ringers 10 mL/hr at 06/30/22 0900   meropenem (MERREM) IV Stopped (06/29/22 2236)   prismasol BGK 4/2.5 1,500 mL/hr at 06/30/22 0803   vancomycin Stopped (06/29/22 1302)    PRN Meds: sodium chloride,  acetaminophen (TYLENOL) oral liquid 160 mg/5 mL, alteplase, anticoagulant sodium citrate, fentaNYL (SUBLIMAZE) injection, heparin, heparin, lidocaine (PF), lidocaine-prilocaine, lip balm, LORazepam, pentafluoroprop-tetrafluoroeth, sodium chloride, sodium chloride flush, white petrolatum  Physical Exam Constitutional:      General: She is not in acute distress.    Appearance: She is ill-appearing.     Comments: Intubated - does not wake to voice or gentle touch  Pulmonary:     Effort: Pulmonary effort is normal.  Skin:    General: Skin is warm and dry.             Vital Signs: BP (!) 127/56 (BP Location: Left Arm)   Pulse 89   Temp 98.1 F (36.7 C)   Resp (!) 24   Wt 74.3 kg   SpO2 98%   BMI 28.12 kg/m  SpO2: SpO2: 98 % O2 Device: O2 Device: Ventilator O2 Flow Rate: O2 Flow Rate (L/min): 4 L/min  Intake/output summary:  Intake/Output Summary (Last 24 hours) at 06/30/2022 1001 Last data filed at 06/30/2022 0916 Gross per 24 hour  Intake 2638.18 ml  Output 4997.1 ml  Net -2358.92 ml    LBM: Last BM Date : 06/30/22 (flexiseal) Baseline Weight: Weight: 101 kg  Most recent weight: Weight: 74.3 kg   Patient Active Problem List   Diagnosis Date Noted   AKI (acute kidney injury) ([place]) 06/23/2022   Acute encephalopathy 06/22/2022   MSSA bacteremia 06/16/2022   Small bowel obstruction ([place]) 06/16/2022   Compromised airway 06/15/2022   Septic shock ([place]) 06/14/2022   On mechanically assisted ventilation ([place]) 06/11/2022   Sepsis with acute hypoxic respiratory failure ([place]) 06/11/2022   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   Acute pulmonary edema ([place]) 84/13/2440   Toxic metabolic encephalopathy 05/19/2535   Pressure injury of skin 06/06/2022   Acute respiratory failure with hypoxia ([place]) 06/04/2022   Anasarca 05/13/2022   Abnormal LFTs 64/40/3474   Alcoholic hepatitis 25/95/6387   Normal anion gap metabolic acidosis 56/43/3295   Hyperglycemia 05/13/2022   Coagulopathy ([place]) 05/13/2022    Thrombocytopenia ([place]) 05/13/2022   Macrocytic anemia 05/13/2022   Obesity (BMI 30-39.9) 05/13/2022   Leg edema 05/11/2022   Advanced care planning/counseling discussion    Depression, major, single episode, moderate ([place])    Goals of care, counseling/discussion    Serum total bilirubin elevated 04/24/2022   Alcohol induced acute pancreatitis 18/84/1660   Alcoholic liver disease, unspecified ([place]) 04/23/2022   Hypoalbuminemia 04/23/2022   Hyponatremia 04/23/2022   Alcohol abuse 04/23/2022   QT prolongation 04/23/2022   Hypokalemia 04/23/2022   Appendicitis, acute s/p laparoscopic appendectomy 04/11/13 04/12/2013    Palliative Care Assessment & Plan   HPI: 51 y.o. female  with past medical history of ETOH cirrhosis admitted on 05/31/2022 with severe sepsis and acute metabolic encephalopathy. 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 With acute respiratory failure as well - extubated 11/22. PMT consulted to discuss [place].     Assessment: Checked in with patient's mother - she has received updates from critical care and nursing staff. She has no questions. Plan remains to allow time for improvement with CRRT.  Received update from RN - no changes. BUN down to 70s but no improvement in mental status yet.   Discussed care with Dr. Chase Caller - mom has shared she does not think moving forward with trach/LTACH would align with patient's wishes. We discussed continuing current measures and planning for family meeting next week.   Recommendations/Plan: CODE STATUS changed to DNR Patient's mother seems to understand concern the patient is not improving, she would like to continue dialysis for now to see if it offers any improvement -planning for a family meeting to include multiple specialties next week Family clear they do not want patient to go to [facility] PMT will continue to follow  Code Status: DNR  Care plan was discussed with critical care team, RN, patient's mother  Thank you for allowing  the Palliative Medicine Team to assist in the care of this patient.   *Please note that this is a verbal dictation therefore any spelling or grammatical errors are due to the "[place]" system interpretation.  Juel Burrow, DNP, [facility] Palliative Medicine Team Team Phone # [phone]  Pager [phone]

## 2022-06-30 NOTE — Progress Notes (Signed)
Subjective:  Decision made to start CRRT yesterday given her catabolic state and desire to get BUN down to assess true MS.  CRRT has been running well-  BUN down to 70's-  hemodynamically stable-  able to get negative 2 liters in last 24 hours-  400 UOP.  Mother at bedside-  said the night was better   Objective Vital signs in last 24 hours: Vitals:   06/30/22 0615 06/30/22 0630 06/30/22 0645 06/30/22 0700  BP: (!) 116/51 (!) 117/57 (!) 123/55 (!) 126/55  Pulse: 92 94 94 92  Resp: (!) 26 (!) 27 (!) 25 (!) 24  Temp: (!) 96.3 F (35.7 C) 98.8 F (37.1 C) 99 F (37.2 C) 98.6 F (37 C)  TempSrc:      SpO2: 98% 98% 98% 99%  Weight:       Weight change:   Intake/Output Summary (Last 24 hours) at 06/30/2022 1610 Last data filed at 06/30/2022 0700 Gross per 24 hour  Intake 2493.34 ml  Output 4653.1 ml  Net -2159.76 ml    Assessment/Plan:      Non-Oliguric AKI: Likely secondary to ATN associated with shock.   - Management of shock has not improved her renal function unfortunately-  since making urine not suspicious of obstruction-  urine seemed bland when checked prior-  recheck is same.  Agree that one would not be able to assess her MS with a BUN over 150.  I sat down and talked with the Mother.  Told her my concern that if we started dialysis unsure she would be able to come off and if she does not improve otherwise globally she really would not be a candidate to continue dialysis support-  realistically would be "stuck " in a hospital or LTACH for the rest of her life.  However, I see it from Moms POV-  pt is young and opens her eyes-  she wants to give her every chance so she wont feel guilty in the future.  I offered a 2 week trial of IHD to get her BUN down and to see if she has it in her to improve otherwise.  If her MS does not improve and she cannot wean from vent -  need to reconsider the dialysis.  Mom seems to understand-   non tunneled HD cath done on 12/5-  - one IHD treatment-   then  having issues getting BUN down with IHD-  CRRT started 12/8 -  clearance is better.  To continue CRRT.  I have told the Mom that we are setting the stage for her to get better-  now we need to see if she will    AMS/seizures: Using lactulose and rifaximin.  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  abx per primary team and ID   Acute hypoxic respiratory failure: Ventilatory support per primary team  Anemia-  have added ESA -  iron stores adequate.  Hgb drifting down still     Louis Meckel    Labs: Basic Metabolic Panel: Recent Labs  Lab 06/27/22 0423 06/28/22 0810 06/29/22 0532 06/29/22 1631 06/30/22 0125  NA 134*   < > 135 134* 138  K 3.7   < > 5.0 4.1 4.1  CL 95*   < > 98 97* 101  CO2 20*   < > 17* 20* 22  GLUCOSE 211*   < > 164* 144* 120*  BUN 116*   < > 149* 105* 72*  CREATININE 2.34*   < > 3.11* 2.07* 1.49*  CALCIUM 9.1   < > 8.2* 8.0* 8.2*  PHOS 6.6*  --   --  6.6* 5.0*   < > = values in this interval not displayed.   Liver Function Tests: Recent Labs  Lab 06/28/22 0810 06/29/22 0532 06/29/22 1631 06/30/22 0125  AST 80* 98*  --  90*  ALT 30 30  --  32  ALKPHOS 105 142*  --  165*  BILITOT 3.1* 3.3*  --  2.9*  PROT 5.6* 5.7*  --  5.6*  ALBUMIN 2.4* 2.2* 2.2* 2.1*  2.1*   No results for input(s): "LIPASE", "AMYLASE" in the last 168 hours. Recent Labs  Lab 06/24/22 0259  AMMONIA 37*   CBC: Recent Labs  Lab 06/26/22 0205 06/27/22 0423 06/28/22 0810 06/29/22 0532 06/30/22 0125  WBC 17.5* 23.4* 32.1* 42.0* 31.9*  NEUTROABS  --   --   --  42.0*  --   HGB 8.1* 8.5* 8.4* 7.9* 7.4*  HCT 25.0* 24.9* 25.4* 24.3* 22.1*  MCV 113.1* 111.2* 112.4* 113.6* 112.2*  PLT 102* 108*  137* 172 153   Cardiac Enzymes: No results for input(s): "CKTOTAL", "CKMB", "CKMBINDEX", "TROPONINI" in the last 168 hours. CBG: Recent Labs  Lab 06/29/22 1139 06/29/22 1545 06/29/22 1933 06/29/22 2334 06/30/22 0405  GLUCAP 133* 127* 107* 126* 134*    Iron Studies:  Recent Labs    06/28/22 0423  IRON 54  TIBC 221*  FERRITIN 777*   Studies/Results: DG Abd 1 View  Result Date: 06/28/2022 CLINICAL DATA:  Hypoxic respiratory failure EXAM: ABDOMEN - 1 VIEW COMPARISON:  Portable exam 1155 hours compared to 06/26/2022 FINDINGS: Tip of feeding tube projects over gastric antrum. Probe tip projects over GE junction region. RIGHT femoral line tip projects over RIGHT pelvis. Enlargement of cardiac silhouette. Persistent atelectasis versus consolidation LEFT lower lobe. Nonobstructive bowel gas pattern. Small amount of contrast opacification within the stomach. No acute osseous findings. IMPRESSION: Persistent atelectasis versus consolidation LEFT lower lobe. Electronically Signed   By: Lavonia Dana M.D.   On: 06/28/2022 14:34   DG CHEST PORT 1 VIEW  Result Date: 06/28/2022 CLINICAL DATA:  Hypoxic respiratory failure EXAM: PORTABLE CHEST 1 VIEW COMPARISON:  Portable exam 1154 hours compared to 06/27/2022 FINDINGS: Tip of endotracheal tube projects 3.1 cm above carina. Feeding tube extends into stomach. Probe tip at GE junction. RIGHT arm PICC line tip projects over RIGHT atrium. Enlargement of cardiac silhouette. Atelectasis versus consolidation of LEFT lower lobe persists. Overall low lung volumes. No gross pleural effusion or pneumothorax. IMPRESSION: Persistent atelectasis versus consolidation LEFT lower lobe. Electronically Signed   By: Lavonia Dana M.D.   On: 06/28/2022 14:33   Medications: Infusions:   prismasol BGK 4/2.5 400 mL/hr at 06/29/22 2315    prismasol BGK 4/2.5 400 mL/hr at 06/29/22 2315   sodium chloride 5 mL/hr at 06/30/22 0700   anticoagulant sodium citrate     feeding  supplement (VITAL 1.5 CAL) 55 mL/hr at 06/30/22 0700   lactated ringers 10 mL/hr at 06/30/22 0700   meropenem (MERREM) IV Stopped (06/29/22 2236)   prismasol BGK 4/2.5 1,500 mL/hr at 06/30/22 0442   vancomycin Stopped (06/29/22 1302)    Scheduled Medications:  Chlorhexidine Gluconate  Cloth  6 each Topical Q0600   darbepoetin (ARANESP) injection - DIALYSIS  150 mcg Subcutaneous Q Wed-1800   feeding supplement (PROSource TF20)  60 mL Per Tube Daily   folic acid  1 mg Per Tube Daily   heparin  5,000 Units Subcutaneous Q8H   insulin aspart  0-20 Units Subcutaneous Q4H   insulin aspart  8 Units Subcutaneous Q4H   insulin detemir  12 Units Subcutaneous BID   lactulose  10 g Per Tube Daily   leptospermum manuka honey  1 Application Topical QHS   levETIRAcetam  1,000 mg Per Tube BID   midodrine  10 mg Per Tube Q8H   multivitamin  1 tablet Per Tube QHS   mouth rinse  15 mL Mouth Rinse Q2H   pantoprazole (PROTONIX) IV  40 mg Intravenous Q24H   rifaximin  550 mg Per Tube BID   sodium chloride flush  10-40 mL Intracatheter Q12H   thiamine  100 mg Per Tube Daily    have reviewed scheduled and prn medications.  Physical Exam: General:  sedated on vent-  did not rouse to my exam  Heart: tachy Lungs:mostly clear Abdomen: soft, non tender Extremities: pitting edema throughout Dialysis Access: femoral temp HD cath -  placed 12/5    06/30/2022,7:28 AM  LOS: 32 days

## 2022-07-01 ENCOUNTER — Encounter (HOSPITAL_COMMUNITY): Payer: Self-pay | Admitting: Pulmonary Disease

## 2022-07-01 ENCOUNTER — Other Ambulatory Visit: Payer: Self-pay

## 2022-07-01 DIAGNOSIS — J9601 Acute respiratory failure with hypoxia: Secondary | ICD-10-CM | POA: Diagnosis not present

## 2022-07-01 LAB — GLUCOSE, CAPILLARY
Glucose-Capillary: 109 mg/dL — ABNORMAL HIGH (ref 70–99)
Glucose-Capillary: 115 mg/dL — ABNORMAL HIGH (ref 70–99)
Glucose-Capillary: 119 mg/dL — ABNORMAL HIGH (ref 70–99)
Glucose-Capillary: 123 mg/dL — ABNORMAL HIGH (ref 70–99)
Glucose-Capillary: 139 mg/dL — ABNORMAL HIGH (ref 70–99)
Glucose-Capillary: 139 mg/dL — ABNORMAL HIGH (ref 70–99)

## 2022-07-01 LAB — RENAL FUNCTION PANEL
Albumin: 2.1 g/dL — ABNORMAL LOW (ref 3.5–5.0)
Albumin: 2 g/dL — ABNORMAL LOW (ref 3.5–5.0)
Anion gap: 17 — ABNORMAL HIGH (ref 5–15)
Anion gap: 11 (ref 5–15)
BUN: 44 mg/dL — ABNORMAL HIGH (ref 6–20)
BUN: 25 mg/dL — ABNORMAL HIGH (ref 6–20)
CO2: 23 mmol/L (ref 22–32)
CO2: 24 mmol/L (ref 22–32)
Calcium: 8.4 mg/dL — ABNORMAL LOW (ref 8.9–10.3)
Calcium: 8 mg/dL — ABNORMAL LOW (ref 8.9–10.3)
Chloride: 99 mmol/L (ref 98–111)
Chloride: 99 mmol/L (ref 98–111)
Creatinine, Ser: 0.96 mg/dL (ref 0.44–1.00)
Creatinine, Ser: 0.63 mg/dL (ref 0.44–1.00)
GFR, Estimated: >60 mL/min (ref >60)
GFR, Estimated: >60 mL/min (ref >60)
Glucose, Bld: 59 mg/dL — ABNORMAL LOW (ref 70–99)
Glucose, Bld: 140 mg/dL — ABNORMAL HIGH (ref 70–99)
Phosphorus: 4 mg/dL (ref 2.5–4.6)
Phosphorus: 2.9 mg/dL (ref 2.5–4.6)
Potassium: 4.2 mmol/L (ref 3.5–5.1)
Potassium: 4.4 mmol/L (ref 3.5–5.1)
Sodium: 139 mmol/L (ref 135–145)
Sodium: 134 mmol/L — ABNORMAL LOW (ref 135–145)

## 2022-07-01 LAB — COMPREHENSIVE METABOLIC PANEL
ALT: 33 U/L (ref 0–44)
AST: 81 U/L — ABNORMAL HIGH (ref 15–41)
Albumin: 2 g/dL — ABNORMAL LOW (ref 3.5–5.0)
Alkaline Phosphatase: 195 U/L — ABNORMAL HIGH (ref 38–126)
Anion gap: 12 (ref 5–15)
BUN: 32 mg/dL — ABNORMAL HIGH (ref 6–20)
CO2: 24 mmol/L (ref 22–32)
Calcium: 8.4 mg/dL — ABNORMAL LOW (ref 8.9–10.3)
Chloride: 101 mmol/L (ref 98–111)
Creatinine, Ser: 0.8 mg/dL (ref 0.44–1.00)
GFR, Estimated: >60 mL/min (ref >60)
Glucose, Bld: 119 mg/dL — ABNORMAL HIGH (ref 70–99)
Potassium: 4.6 mmol/L (ref 3.5–5.1)
Sodium: 137 mmol/L (ref 135–145)
Total Bilirubin: 2.8 mg/dL — ABNORMAL HIGH (ref 0.3–1.2)
Total Protein: 5.8 g/dL — ABNORMAL LOW (ref 6.5–8.1)

## 2022-07-01 LAB — CULTURE, RESPIRATORY W GRAM STAIN

## 2022-07-01 LAB — PROTIME-INR
INR: 1.2 (ref 0.8–1.2)
Prothrombin Time: 14.8 seconds (ref 11.4–15.2)

## 2022-07-01 LAB — CBC
HCT: 23.7 % — ABNORMAL LOW (ref 36.0–46.0)
Hemoglobin: 7.5 g/dL — ABNORMAL LOW (ref 12.0–15.0)
MCH: 37.3 pg — ABNORMAL HIGH (ref 26.0–34.0)
MCHC: 31.6 g/dL (ref 30.0–36.0)
MCV: 117.9 fL — ABNORMAL HIGH (ref 80.0–100.0)
Platelets: 172 10*3/uL (ref 150–400)
RBC: 2.01 MIL/uL — ABNORMAL LOW (ref 3.87–5.11)
RDW: 21.2 % — ABNORMAL HIGH (ref 11.5–15.5)
WBC: 28.3 10*3/uL — ABNORMAL HIGH (ref 4.0–10.5)
nRBC: 0.2 % (ref 0.0–0.2)

## 2022-07-01 LAB — MAGNESIUM: Magnesium: 2.5 mg/dL — ABNORMAL HIGH (ref 1.7–2.4)

## 2022-07-01 LAB — MRSA NEXT GEN BY PCR, NASAL: MRSA by PCR Next Gen: NOT DETECTED

## 2022-07-01 LAB — AMMONIA: Ammonia: 33 umol/L (ref 9–35)

## 2022-07-01 LAB — PHOSPHORUS: Phosphorus: 3.2 mg/dL (ref 2.5–4.6)

## 2022-07-01 MED ORDER — INSULIN DETEMIR 100 UNIT/ML ~~LOC~~ SOLN
10.0000 [IU] | Freq: Two times a day (BID) | SUBCUTANEOUS | Status: DC
  Administered 2022-07-01 – 2022-07-04 (×6): 10 [IU] via SUBCUTANEOUS
  Filled 2022-07-01 (×7): qty 0.1

## 2022-07-01 NOTE — Progress Notes (Signed)
NAME:  Deanna Mcmillan, MRN:  [id number], DOB:  July 06, 1971, LOS: 84 ADMISSION DATE:  05/26/2022, CONSULTATION DATE:  11/7 REFERRING MD:  Lendon Colonel, EDP CHIEF COMPLAINT:  AMS   BRIEF  51 yo woman with hx of recent diagnosis of alcoholic hepatitis (dx 70/17), here with ams, fever.  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 Seizure-EEG neg Started on pulse steroids per neuro for autoimmune cause    Recently seen at [facility] 10/19: LE Edema. Lasix not helpful at home.  Admitted for diuresis.  Jaundice and abd distension also noted.  Pleural effusion.  GI saw: non enough ascites for tap  Started lasix and spironolactone.  28 day course of prednisolone, miralax, lactulose, miralax. Midodrine TID     Pertinent  Medical History  ETOH Cirrhosis BLE edema recently worsening  S/p lap appy 2014    Echo: recent normal [id number]/3903   Home meds: folic ascid, acetaminohpen, lasix 40bid, lactulose 30 TID protonix, prednisone (04/29/22), prednisolone 04/28/22 three week course, thiamine, simethicone,    Per her mother she had been drinking at least one bottle of wine daily  Significant Hospital Events: Including procedures, antibiotic start and stop dates in addition to other pertinent events   11/7: Intubated in ED and admitted to ICU 11/7: CTH neg, CT A/P cirrhosis with portal hypertension, splenomegaly, moderate volume ascites, moderate bilateral pleural effusion Blood culture - Listeria and MSSA and MSSE (per mom health dept thinks she got it at [facility] but patient does love cheese) Urine culture - Pan sensitive E colii 11/8 cutlures Trach - MSSA Blood - neg 11/9 KUB: No ileus 11/10: LP confirms listeria meningitis 11/11 KUB: ileus 11/12 ileus resolved with aggressive bowel regimen 11/15 KUB: No ileus. 11/18 more  awake, following commands 1121 RUE PICC 11/22 extubated to BiPAP 11/23 - 14d of Rx complerted for MSSA/MESSE. For listeria - ID wants to extend amp/gent to 3 weeks 11/8-11/29. Ileus post extubation and NG to LIS . Rico increased. BP sofft an dlevophed and alb Rx.   11/24 -  OG placed yesterday for ileus and bile returns. TF on hold. Lactulose increased yesterday. REamins off vent. Did not need BiPAP. This AM more obtunded but mom thinks was awake at night and is sleeping due to fatigue. Afebrile.  But wBC up 13L. Neg baklance -3L:  OFF PRESSORs = Ammonia up at 72 - [place] mild periphepatic ascites Blood culture  11/25 - Now intubated due to obtunded encephaopathy. CTAbc with SBO v Ileus with transition ponit . CCS consult - no surgery but ok for rectal lactulose w ith continue NG/OG tube to LIS. ID ordered repeat blood culture. Linezolid added.  No ntrition since 06/14/22 Pm. Creat worsening. LEss Ur OP. On levophed 64mcg. Not on sedation. Vent 40% Ammonia 108 Trach aspirate -Klebsiella oxytoca 11/27 - Remains intubated. On propofol 57mcg and levophed 1mcg. Febrile up to 101.71F overnight. On ampicillin and linezolid, going for IR guided LP today. Kidney function improving, repleting potassium. No seizure activity noted overnight on EEG. Surgery following for ileus, no need for surgical intervention. Holding TF for today.  11/29 - started on pulse dose steroids, getting autoimmune workup, CSF studies 12/3 - finished steroids, workup negative thus far 12/7 -  Paracentesis -rare WBC no organisms seen Blood culture  Tracheal aspirate: -Few Klebsiella oxytoca and few Candida LUSITANIAE 12/8 -  CRRT started.  Remains encephalopathic. Febrile to 101.25F overnight, worsening leukocytosis. Paracentesis performed yesterday, without any notable findings thus far. On empiric vancomycin and merrem.  Starting CRRT today per nephrology.  12/9 -on the ventilator FiO2 40%.  Getting tube feeds.  On CRRT.  Not on  pressors.  afebrile since 06/28/2022.  White count reduced to 32 K.  Neurology signed off.  Discontinued LTM.  Keppra has been increased to 1000 mg twice daily during the duration of CRRT.  Anasarca has improved.  Mother at the bedside. EEG off.  Interim History / Subjective:   12/10 -3040% on the ventilator.  On tube feeds.  On vancomycin and Merrem.  Respiratory distress 7/23 growing some Candida LUSITIANE and Klebsiella.        Blood pressure (!) 144/67, pulse 91, temperature 98.4 F (36.9 C), resp. rate (!) 23, height 5\' 4"  (1.626 m), weight 68.9 kg, SpO2 100 %.    Vent Mode: PRVC FiO2 (%):  [40 %] 40 % Set Rate:  [20 bmp] 20 bmp Vt Set:  [420 mL] 420 mL PEEP:  [5 cmH20] 5 cmH20 Pressure Support:  [5 cmH20] 5 cmH20 Plateau Pressure:  [15 cmH20-16 cmH20] 16 cmH20   Intake/Output Summary (Last 24 hours) at 07/01/2022 1620 Last data filed at 07/01/2022 1600 Gross per 24 hour  Intake 2032.02 ml  Output 4967 ml  Net -2934.98 ml   Filed Weights   06/30/22 0127 07/01/22 0500 07/01/22 0700  Weight: 74.3 kg 68.9 kg 68.9 kg    General Appearance:  Looks criticall ill. JAUNDICED + Head:  Normocephalic, without obvious abnormality, atraumatic Eyes:  PERRL - yes, conjunctiva/corneas - JAUNDICED     Ears:  Normal external ear canals, both ears Nose:  G tube - PANDA Throat:  ETT TUBE - YES , OG tube - NO Neck:  Supple,  No enlargement/tenderness/nodules Lungs: Clear to auscultation bilaterally, Ventilator   Synchrony - YES Heart:  S1 and S2 normal, no murmur, CVP - x.  Pressors - NO Abdomen:  Soft, no masses, no organomegaly Genitalia / Rectal:  Not done Extremities:  Extremities- intact Skin:  ntact in exposed areas . Sacral area - not examined Neurologic:  Sedation - ativan prn, xifaxan, lactulseo, fent prn, KEPPRA -> RASS - -5 remais without change dspite CRRT .  Off c-eeg  Resolved Hospital Problem list    Hypokalemia Hypophosphatemia Hypernatremia Thrombocytopenia Ileus MSSA bacteremia Listeria bacteremia Klebsiella HAP Circulatory Shock   Assessment & Plan:   Toxic Metabolic Encephalopathy Likely multifactorial 2/2 cirrhosis with ascites, GTC seizures, uremia, and listeria meningoencephalitis. This has persisted for over a week now and day-to-day exams have remained essentially unchanged. G-appreciate neurology, ID, nephrology, and palliative care assistance  07/01/2022: Continues to be comatose.  CRRT, Xifaxan and Lactulose ongoing. [place] -Supportive care - check ammonia 07/02/22  Fevers Leukocytosis Listeria meningoencephalitis Patient with fevers essentially on a daily basis and with worsening leukocytosis. Has been on a long course of antibiotics (including broad spectrum) since admission. Listeria bacteremia and MSSA bacteremia have been treated per ID. Per ID, listeria CNS disease can remain and treatment can be up to 6 weeks (currently on week 4 of treatment).    : Afebrile x 48h hours 07/01/22   Plan -appreciate ID assistance -transitioned to vancomycin and merrem for empiric coverage -can transition back to ampicillin if negative workup -will need TEE once extubated  Cirrhosis with ascites [MELD-Na 27, Child Pugh Class C] Cirrhosis present on admission. Received paracentesis  12/7, no elevated neutrophil count and cultures no growth for the past day. Independently a poor prognostic indicator for overall outcome.  06/30/2022: MELD 27  Plan -lactulose daily -rifaximin BID - check ammonia 09/01/21  GTC Seizures (11/26, 12/5) Has had 2 GTC seizures in-hospital. No seizure activity captured on LTM x2.  12/8 -  neuro increased keppra and stopped LTM and signed off  Plan  -keppra increased to 1g BID with CRRT   Acute hypoxic respiratory failure  Given lack of ability to wean, anticipate that patient may need ventilatory support for weeks to months.  If plan remains to continue full scope of care, will need trach placement this week as she has remained on mechanical ventilation for over 3 weeks altogether. Discussed with mother.  -extubated 11/22, reintubated 11/24  07/01/2022 - > does NOT meet criteria for SBT/Extubation in setting of Acute Respiratory Failure due to Coma  Plan  - PRVC   Acute Renal Failure (Baseline Cr 0.6- 0.9)  Severe Uremia Metabolic acidosis Anasarca  07/01/2022: On CRRT for metabolic clearance.  Anasarca has improved but no chagne in mental status  Plan  - per rena  Hyperglycemia, controlled -resistant SSI -TF coverage with 8u q4h -levemir 12u BID -goal BG 140-180  Severe physical deconditioning Protein caloric malnutrition, mild She has remained bedbound in the ICU for 1 month now, has profound neuromuscular weakness. Anticipate that she will need months of physical therapy IF she has any clinically significant neurological recovery moving forward. -TF at goal -She will need aggressive PT/OT (likely CIR) for strength training if clinically improves  Prolonged QTc interval  Hx of SVT - monitor - avoid QTc prolonging medications  Anemia - chronic (baseline hgb 8-9 in oct 2023) -trend hgb curve -transfuse if hgb <7 and/or hemodynamic instability   Best Practice (right click and "Reselect all SmartList Selections" daily)   Diet/type: tubefeeds  DVT prophylaxis: prophylactic heparin  GI prophylaxis: PPI Lines: Central line - HD cath R fem 06/26/22 Foley:  removed 06/26/22 Code Status:  full code Last date of multidisciplinary goals of care discussion [updated mother at bedside daily]  [place] discussions have been held with mother, will try to see if any improvement occurs with resolution of uremia via CRRT. If no improvement, family appears to be open to transitioning to comfort-focused care. Will need to organize family meeting, likely next week.   12-9/23: Discussed with mom again.  She is  preparing for interdisciplinary family goals of care meeting week of 07/02/2022.  Explained the concept of critical care illness.  Explained to her that LTAC tracheostomy will be required.  Explained to her that patients with cirrhosis and renal failure through the worst over the course of many months or year.  Mortality rates are very high.  Morbidity rate is extremely high.  She is going to reflect on all this.  She does not think this is the best interest of patient to go through tracheostomy and LTAC even though the patient is young.  07/01/22 - no family at [place]   The patient Deanna Mcmillan is critically ill with multiple organ systems failure and requires high complexity decision making for assessment and support, frequent evaluation and titration of therapies, application of advanced monitoring technologies and extensive interpretation of multiple databases and discussion with other appropriate health care personnel such as bedside nurses, social workers, case Freight forwarder, consultants, respiratory therapists, nutritionists, secretaries etc.,  Critical care time includes but is  not restricted to just documentation time. Documentation can happen in parallel or sequential to care time depending on case mix urgency and priorities for the shift. So, overall critical Care Time devoted to patient care services described in this note is  15  Minutes.   This time reflects time of care of this signee Dr Brand Males which includ does not reflect procedure time, or teaching time or supervisory time of PA/NP/Med student/Med Resident etc but could involve care discussion time     Dr. Brand Males, M.D., [facility].C.P Pulmonary and Critical Care Medicine Medical Director - [facility] ICU Staff Physician, [place] [facility] Pager: [phone], If no answer or between  15:00h - 7:00h: call [phone]  07/01/2022 4:35  PM   LABS    PULMONARY No results for input(s): "PHART", "PCO2ART", "PO2ART", "HCO3", "TCO2", "O2SAT" in the last 168 hours.  Invalid input(s): "PCO2", "PO2"  CBC Recent Labs  Lab 06/29/22 0532 06/30/22 0125 07/01/22 0242  HGB 7.9* 7.4* 7.5*  HCT 24.3* 22.1* 23.7*  WBC 42.0* 31.9* 28.3*  PLT 172 153 172    COAGULATION Recent Labs  Lab 07/01/22 0242  INR 1.2    CARDIAC  No results for input(s): "TROPONINI" in the last 168 hours. No results for input(s): "PROBNP" in the last 168 hours.   CHEMISTRY Recent Labs  Lab 06/25/22 0319 06/26/22 0205 06/27/22 0423 06/28/22 0810 06/29/22 0532 06/29/22 1631 06/30/22 0125 06/30/22 1644 07/01/22 0242  NA 138 141 134*   < > 135 134* 138 139 137  K 4.1 4.1 3.7   < > 5.0 4.1 4.1 4.2 4.6  CL 100 103 95*   < > 98 97* 101 99 101  CO2 22 19* 20*   < > 17* 20* 22 23 24   GLUCOSE 226* 299* 211*   < > 164* 144* 120* 59* 119*  BUN 148* 168* 116*   < > 149* 105* 72* 44* 32*  CREATININE 2.84* 3.26* 2.34*   < > 3.11* 2.07* 1.49* 0.96 0.80  CALCIUM 8.0* 8.2* 9.1   < > 8.2* 8.0* 8.2* 8.4* 8.4*  MG 2.4 2.7* 2.2  --   --   --  2.4  --  2.5*  PHOS 4.3 6.6* 6.6*  --   --  6.6* 5.0* 4.0 3.2   < > = values in this interval not displayed.   Estimated Creatinine Clearance: 78.4 mL/min (by C-G formula based on SCr of 0.8 mg/dL).   LIVER Recent Labs  Lab 06/25/22 0319 06/28/22 0810 06/29/22 0532 06/29/22 1631 06/30/22 0125 06/30/22 1644 07/01/22 0242  AST 98* 80* 98*  --  90*  --  81*  ALT 30 30 30   --  32  --  33  ALKPHOS 105 105 142*  --  165*  --  195*  BILITOT 3.4* 3.1* 3.3*  --  2.9*  --  2.8*  PROT 5.7* 5.6* 5.7*  --  5.6*  --  5.8*  ALBUMIN 2.4* 2.4* 2.2* 2.2* 2.1*  2.1* 2.1* 2.0*  INR  --   --   --   --   --   --  1.2    Recent Labs  Lab 07/01/22 0242  INR 1.2    INFECTIOUS Recent Labs  Lab 06/28/22 1134 06/28/22 1434  LATICACIDVEN 2.1* 1.7     ENDOCRINE CBG (last 3)  Recent Labs    07/01/22 0743  07/01/22 1143 07/01/22 1551  GLUCAP 119* 139* 123*         IMAGING x48h  - image(s) personally visualized  -   highlighted in bold No results found.

## 2022-07-01 NOTE — Progress Notes (Signed)
Patient occasionally moves head side to side with stimulation, non purposefully, no eye opening seen by nurse, no following of commands.

## 2022-07-01 NOTE — Progress Notes (Signed)
Subjective:   CRRT has been running well-  BUN down to 32-  hemodynamically stable-  able to get negative 2 liters in last 24 hours-   Mother at bedside-  said the night was better.  Unfortunately there does not seem to be improvement in MS even with normalization of BUN.  Mom at bedside -  said she is moving head more  Objective Vital signs in last 24 hours: Vitals:   07/01/22 0600 07/01/22 0615 07/01/22 0630 07/01/22 0645  BP: 137/63 137/66 136/61 (!) 140/61  Pulse: 93 95 92 89  Resp: (!) 21 (!) 24 (!) 23 20  Temp: 98.8 F (37.1 C) 98.6 F (37 C) 98.6 F (37 C) 98.6 F (37 C)  TempSrc:      SpO2: 100% 100% 100% 100%  Weight:       Weight change: -5.4 kg  Intake/Output Summary (Last 24 hours) at 07/01/2022 2706 Last data filed at 07/01/2022 0600 Gross per 24 hour  Intake 2414.71 ml  Output 4785.6 ml  Net -2370.89 ml    Assessment/Plan:      Non-Oliguric AKI: Likely secondary to ATN associated with shock.   - Management of shock had not improved her renal function -  since making urine not suspicious of obstruction-  urine seemed bland when checked prior-  recheck is same.  Agree that one would not be able to assess her MS with a BUN over 150.  I sat down and talked with the Mother.  Told her my concern that if we started dialysis unsure she would be able to come off and if she does not improve otherwise globally she really would not be a candidate to continue dialysis support-  realistically would be "stuck " in a hospital or LTACH for the rest of her life.  However, I see it from Moms POV-  pt is young and opens her eyes-  she wants to give her every chance so she wont feel guilty in the future.  I offered a no more than 2 week trial of RRT to get her BUN down and to see if she has it in her to improve otherwise.  If her MS does not improve and she cannot wean from vent -  need to reconsider the dialysis.  Mom seems to understand-   non tunneled HD cath done on 12/5-  - one IHD  treatment-  then  having issues getting BUN down with IHD-  CRRT started 12/8 -  clearance is better-  kidney numbers have normalized.  To continue CRRT.  I have told the Mom that we are setting the stage for her to get better-  now we need to see if she will -  not much progress unfortunately-  palliative involved-  plan for meeting next week   AMS/seizures: Using lactulose and rifaximin.  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  abx per primary team and ID   Acute hypoxic respiratory failure: Ventilatory support per primary team  Anemia-  have added ESA -  iron stores adequate.  Hgb drifting down still     Louis Meckel    Labs: Basic Metabolic Panel: Recent Labs  Lab 06/30/22 0125 06/30/22 1644 07/01/22 0242  NA 138 139 137  K  4.1 4.2 4.6  CL 101 99 101  CO2 22 23 24   GLUCOSE 120* 59* 119*  BUN 72* 44* 32*  CREATININE 1.49* 0.96 0.80  CALCIUM 8.2* 8.4* 8.4*  PHOS 5.0* 4.0 3.2   Liver Function Tests: Recent Labs  Lab 06/29/22 0532 06/29/22 1631 06/30/22 0125 06/30/22 1644 07/01/22 0242  AST 98*  --  90*  --  81*  ALT 30  --  32  --  33  ALKPHOS 142*  --  165*  --  195*  BILITOT 3.3*  --  2.9*  --  2.8*  PROT 5.7*  --  5.6*  --  5.8*  ALBUMIN 2.2*   < > 2.1*  2.1* 2.1* 2.0*   < > = values in this interval not displayed.   No results for input(s): "LIPASE", "AMYLASE" in the last 168 hours. Recent Labs  Lab 07/01/22 0242  AMMONIA 33   CBC: Recent Labs  Lab 06/27/22 0423 06/28/22 0810 06/29/22 0532 06/30/22 0125 07/01/22 0242  WBC 23.4* 32.1* 42.0* 31.9* 28.3*  NEUTROABS  --   --  42.0*  --   --   HGB 8.5* 8.4* 7.9* 7.4* 7.5*  HCT 24.9* 25.4* 24.3* 22.1* 23.7*  MCV 111.2* 112.4* 113.6* 112.2*  117.9*  PLT 108* 137* 172 153 172   Cardiac Enzymes: No results for input(s): "CKTOTAL", "CKMB", "CKMBINDEX", "TROPONINI" in the last 168 hours. CBG: Recent Labs  Lab 06/30/22 1122 06/30/22 1534 06/30/22 1943 06/30/22 2317 07/01/22 0251  GLUCAP 121* 116* 124* 118* 109*    Iron Studies:  No results for input(s): "IRON", "TIBC", "TRANSFERRIN", "FERRITIN" in the last 72 hours.  Studies/Results: No results found. Medications: Infusions:   prismasol BGK 4/2.5 400 mL/hr at 07/01/22 0014    prismasol BGK 4/2.5 400 mL/hr at 07/01/22 0014   sodium chloride 5 mL/hr at 07/01/22 0600   anticoagulant sodium citrate     feeding supplement (VITAL 1.5 CAL) 55 mL/hr at 07/01/22 0600   lactated ringers 10 mL/hr at 07/01/22 0600   meropenem (MERREM) IV Stopped (06/30/22 2233)   prismasol BGK 4/2.5 1,500 mL/hr at 07/01/22 0631   vancomycin Stopped (06/30/22 1448)    Scheduled Medications:  Chlorhexidine Gluconate Cloth  6 each Topical Q0600   darbepoetin (ARANESP) injection - DIALYSIS  150 mcg Subcutaneous Q Wed-1800   feeding supplement (PROSource TF20)  60 mL Per Tube Daily   folic acid  1 mg Per Tube Daily   heparin  5,000 Units Subcutaneous Q8H   insulin aspart  0-20 Units Subcutaneous Q4H   insulin aspart  8 Units Subcutaneous Q4H   insulin detemir  12 Units Subcutaneous BID   lactulose  10 g Per Tube Daily   leptospermum manuka honey  1 Application Topical QHS   levETIRAcetam  1,000 mg Per Tube BID   midodrine  10 mg Per Tube Q8H   multivitamin  1 tablet Per Tube QHS   mouth rinse  15 mL Mouth Rinse Q2H   pantoprazole (PROTONIX) IV  40 mg Intravenous Q24H   rifaximin  550 mg Per Tube BID   sodium chloride flush  10-40 mL Intracatheter Q12H   thiamine  100 mg Per Tube Daily    have reviewed scheduled and prn medications.  Physical Exam: General:  sedated on vent-  did not rouse to my exam at all Heart: tachy Lungs:mostly clear Abdomen: soft, non tender Extremities:  pitting edema throughout Dialysis Access: femoral temp HD cath -  placed 12/5  07/01/2022,7:02 AM  LOS: 33 days

## 2022-07-01 NOTE — Progress Notes (Signed)
Pt placed on wean at this time.

## 2022-07-02 DIAGNOSIS — N179 Acute kidney failure, unspecified: Secondary | ICD-10-CM | POA: Diagnosis not present

## 2022-07-02 DIAGNOSIS — A419 Sepsis, unspecified organism: Secondary | ICD-10-CM | POA: Diagnosis not present

## 2022-07-02 DIAGNOSIS — G9340 Encephalopathy, unspecified: Secondary | ICD-10-CM | POA: Diagnosis not present

## 2022-07-02 DIAGNOSIS — R7881 Bacteremia: Secondary | ICD-10-CM

## 2022-07-02 DIAGNOSIS — J9601 Acute respiratory failure with hypoxia: Secondary | ICD-10-CM | POA: Diagnosis not present

## 2022-07-02 LAB — RENAL FUNCTION PANEL
Albumin: 2.1 g/dL — ABNORMAL LOW (ref 3.5–5.0)
Albumin: 2.2 g/dL — ABNORMAL LOW (ref 3.5–5.0)
Anion gap: 10 (ref 5–15)
Anion gap: 9 (ref 5–15)
BUN: 21 mg/dL — ABNORMAL HIGH (ref 6–20)
BUN: 20 mg/dL (ref 6–20)
CO2: 24 mmol/L (ref 22–32)
CO2: 26 mmol/L (ref 22–32)
Calcium: 8.3 mg/dL — ABNORMAL LOW (ref 8.9–10.3)
Calcium: 8.6 mg/dL — ABNORMAL LOW (ref 8.9–10.3)
Chloride: 101 mmol/L (ref 98–111)
Chloride: 101 mmol/L (ref 98–111)
Creatinine, Ser: 0.58 mg/dL (ref 0.44–1.00)
Creatinine, Ser: 0.57 mg/dL (ref 0.44–1.00)
GFR, Estimated: >60 mL/min (ref >60)
GFR, Estimated: >60 mL/min (ref >60)
Glucose, Bld: 134 mg/dL — ABNORMAL HIGH (ref 70–99)
Glucose, Bld: 159 mg/dL — ABNORMAL HIGH (ref 70–99)
Phosphorus: 2.8 mg/dL (ref 2.5–4.6)
Phosphorus: 2.9 mg/dL (ref 2.5–4.6)
Potassium: 4.6 mmol/L (ref 3.5–5.1)
Potassium: 4.6 mmol/L (ref 3.5–5.1)
Sodium: 135 mmol/L (ref 135–145)
Sodium: 136 mmol/L (ref 135–145)

## 2022-07-02 LAB — GLUCOSE, CAPILLARY
Glucose-Capillary: 115 mg/dL — ABNORMAL HIGH (ref 70–99)
Glucose-Capillary: 121 mg/dL — ABNORMAL HIGH (ref 70–99)
Glucose-Capillary: 127 mg/dL — ABNORMAL HIGH (ref 70–99)
Glucose-Capillary: 142 mg/dL — ABNORMAL HIGH (ref 70–99)
Glucose-Capillary: 142 mg/dL — ABNORMAL HIGH (ref 70–99)
Glucose-Capillary: 150 mg/dL — ABNORMAL HIGH (ref 70–99)

## 2022-07-02 LAB — CBC
HCT: 24.8 % — ABNORMAL LOW (ref 36.0–46.0)
Hemoglobin: 7.9 g/dL — ABNORMAL LOW (ref 12.0–15.0)
MCH: 37.4 pg — ABNORMAL HIGH (ref 26.0–34.0)
MCHC: 31.9 g/dL (ref 30.0–36.0)
MCV: 117.5 fL — ABNORMAL HIGH (ref 80.0–100.0)
Platelets: 183 10*3/uL (ref 150–400)
RBC: 2.11 MIL/uL — ABNORMAL LOW (ref 3.87–5.11)
RDW: 20.9 % — ABNORMAL HIGH (ref 11.5–15.5)
WBC: 23.6 10*3/uL — ABNORMAL HIGH (ref 4.0–10.5)
nRBC: 0.1 % (ref 0.0–0.2)

## 2022-07-02 LAB — BODY FLUID CULTURE W GRAM STAIN: Culture: NO GROWTH

## 2022-07-02 LAB — HEPATIC FUNCTION PANEL
ALT: 36 U/L (ref 0–44)
AST: 71 U/L — ABNORMAL HIGH (ref 15–41)
Albumin: 2.1 g/dL — ABNORMAL LOW (ref 3.5–5.0)
Alkaline Phosphatase: 236 U/L — ABNORMAL HIGH (ref 38–126)
Bilirubin, Direct: 1.3 mg/dL — ABNORMAL HIGH (ref 0.0–0.2)
Indirect Bilirubin: 1.3 mg/dL — ABNORMAL HIGH (ref 0.3–0.9)
Total Bilirubin: 2.6 mg/dL — ABNORMAL HIGH (ref 0.3–1.2)
Total Protein: 6 g/dL — ABNORMAL LOW (ref 6.5–8.1)

## 2022-07-02 LAB — PROTIME-INR
INR: 1.1 (ref 0.8–1.2)
Prothrombin Time: 14.4 seconds (ref 11.4–15.2)

## 2022-07-02 LAB — MAGNESIUM: Magnesium: 2.5 mg/dL — ABNORMAL HIGH (ref 1.7–2.4)

## 2022-07-02 LAB — AMMONIA: Ammonia: <10 umol/L (ref 9–35)

## 2022-07-02 MED ORDER — ORAL CARE MOUTH RINSE
15.0000 mL | OROMUCOSAL | Status: DC
  Administered 2022-07-02 – 2022-07-04 (×28): 15 mL via OROMUCOSAL

## 2022-07-02 MED ORDER — ORAL CARE MOUTH RINSE: 15.0000 mL | OROMUCOSAL | Status: DC | PRN

## 2022-07-02 NOTE — Progress Notes (Signed)
Patient ID: OLUWATOBI RUPPE, female   DOB: Mar 11, 1971, 51 y.o.   MRN: [id number]    Progress Note from the Palliative Medicine Team at [facility]   Patient Name: Deanna Mcmillan        Date: 07/02/2022 DOB: 12/25/1970  Age: 51 y.o. MRN#: [id number] Attending Physician: Jacky Kindle, MD Primary Care Physician: Mariel Sleet Admit Date: 06/18/2022   Medical records reviewed,  discussed with nursing   51 y.o. female  with past medical history of ETOH cirrhosis admitted on 05/23/2022 with severe sepsis and acute metabolic encephalopathy. 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 With acute respiratory failure as well - extubated 11/22. PMT consulted to discuss [place].     This NP assessed patient at the bedside as a follow up for palliative medicine needs and emotional support.  Mother at bedside.   11/7: Intubated in ED and admitted to ICU 11/7: CTH neg, CT A/P cirrhosis with portal hypertension, splenomegaly, moderate volume ascites, moderate bilateral pleural effusion Blood culture - Listeria and MSSA and MSSE (per mom health dept thinks she got it at [facility] but patient does love cheese) Urine culture - Pan sensitive E colii 11/8 cutlures Trach - MSSA Blood - neg 11/9 KUB: No ileus 11/10: LP confirms listeria meningitis 11/11 KUB: ileus 11/12 ileus resolved with aggressive bowel regimen 11/15 KUB: No ileus. 11/18 more awake, following commands 1121 RUE PICC 11/22 extubated to BiPAP 11/23 - 14d of Rx complerted for MSSA/MESSE. For listeria - ID wants to extend amp/gent to 3 weeks 11/8-11/29. Ileus post extubation and NG to LIS . [place] increased. BP sofft an dlevophed and alb Rx.    11/24 -  OG placed yesterday for ileus and bile returns. TF on hold. Lactulose increased yesterday. REamins off vent. Did not need BiPAP. This AM more obtunded but mom thinks was awake at night and is sleeping due to fatigue. Afebrile.  But wBC up 13L. Neg baklance -3L:  OFF PRESSORs = Ammonia  up at 72 - [place] mild periphepatic ascites Blood culture  11/25 - Now intubated due to obtunded encephaopathy. CTAbc with SBO v Ileus with transition ponit . CCS consult - no surgery but ok for rectal lactulose w ith continue NG/OG tube to LIS. ID ordered repeat blood culture. Linezolid added.  No ntrition since 06/14/22 Pm. Creat worsening. LEss Ur OP. On levophed 28mcg. Not on sedation. Vent 40% Ammonia 108 Trach aspirate -Klebsiella oxytoca 11/27 - Remains intubated. On propofol 46mcg and levophed 33mcg. Febrile up to 101.841F overnight. On ampicillin and linezolid, going for IR guided LP today. Kidney function improving, repleting potassium. No seizure activity noted overnight on EEG. Surgery following for ileus, no need for surgical intervention. Holding TF for today.  11/29 - started on pulse dose steroids, getting autoimmune workup, CSF studies 12/3 - finished steroids, workup negative thus far 12/7 -  Paracentesis -rare WBC no organisms seen Blood culture  Tracheal aspirate: -Few Klebsiella oxytoca and few Candida LUSITANIAE 12/8 - CRRT started.  Remains encephalopathic. Febrile to 101.41F overnight, worsening leukocytosis. Paracentesis performed yesterday, without any notable findings thus far. On empiric vancomycin and merrem.  Starting CRRT today per nephrology.   12/9 -on the ventilator FiO2 40%.  Getting tube feeds.  On CRRT.  Not on pressors.  afebrile since 06/28/2022.  White count reduced to 32 K.  Neurology signed off.  Discontinued LTM.  Keppra has been increased to 1000 mg twice daily during the duration of CRRT.  Anasarca has improved.  Mother at the bedside. EEG off.    Education offered today regarding  the importance of continued conversation with family and their  medical providers regarding overall plan of care and treatment options,  ensuring decisions are within the context of the patients values and GOCs.  Family meeting is scheduled for tomorrow at 12:30.    Questions and  concerns addressed   Discussed with Dr Tacy Learn   Wadie Lessen NP  Palliative Medicine Team Team Phone # [phone] Pager [phone]

## 2022-07-02 NOTE — Progress Notes (Signed)
eLink Physician-Brief Progress Note Patient Name: Deanna Mcmillan DOB: 04-07-1971 MRN: [id number]   Date of Service  07/02/2022  HPI/Events of Note  Notified of hypertension with BP 150/64.  Pt is on CRRT removing 100cc UF.  eICU Interventions  Monitor for now unless SBP >160.     Intervention Category Intermediate Interventions: Hypertension - evaluation and management  Elsie Lincoln 07/02/2022, 12:09 AM

## 2022-07-02 NOTE — Progress Notes (Signed)
Pt placed back on documented full support to rest for the night. Pt tolerating well.

## 2022-07-02 NOTE — Procedures (Signed)
Admit: 05/23/2022 LOS: 47  70F alcohoic cirrhosis, encephalopathy, severe nonoliguric AKI  Current CRRT Prescription: Start Date: 06/29/22, rec iHD 12/5 Catheter: Temp HD Cath R IJ BFR: 250 Pre Blood Pump: 400 4K DFR: 1500 4K Replacement Rate: 400 4K Goal UF: -138mL/h negative Anticoagulation: none Clotting: acceptable   S: Mother Debbie at bedside AM Labs K 4.6, P 2.8; all 4K BUN down to 21 Hb stable WBC trending down 23.6 today No pressors, BPs normal/stable   O: 12/10 0701 - 12/11 0700 In: 2204.9 [I.V.:354.9; NG/GT:1390; IV Piggyback:429.9] Out: 5595.9 [Stool:400]  Filed Weights   07/01/22 0500 07/01/22 0700 07/02/22 0128  Weight: 68.9 kg 68.9 kg 64.9 kg    Recent Labs  Lab 07/01/22 0242 07/01/22 1606 07/02/22 0301  NA 137 134* 135  K 4.6 4.4 4.6  CL 101 99 101  CO2 24 24 24   GLUCOSE 119* 140* 134*  BUN 32* 25* 21*  CREATININE 0.80 0.63 0.58  CALCIUM 8.4* 8.0* 8.3*  PHOS 3.2 2.9 2.8   Recent Labs  Lab 06/29/22 0532 06/30/22 0125 07/01/22 0242 07/02/22 0301  WBC 42.0* 31.9* 28.3* 23.6*  NEUTROABS 42.0*  --   --   --   HGB 7.9* 7.4* 7.5* 7.9*  HCT 24.3* 22.1* 23.7* 24.8*  MCV 113.6* 112.2* 117.9* 117.5*  PLT 172 153 172 183    Scheduled Meds:  Chlorhexidine Gluconate Cloth  6 each Topical Q0600   darbepoetin (ARANESP) injection - DIALYSIS  150 mcg Subcutaneous Q Wed-1800   feeding supplement (PROSource TF20)  60 mL Per Tube Daily   folic acid  1 mg Per Tube Daily   heparin  5,000 Units Subcutaneous Q8H   insulin aspart  0-20 Units Subcutaneous Q4H   insulin aspart  8 Units Subcutaneous Q4H   insulin detemir  10 Units Subcutaneous BID   lactulose  10 g Per Tube Daily   leptospermum manuka honey  1 Application Topical QHS   levETIRAcetam  1,000 mg Per Tube BID   midodrine  10 mg Per Tube Q8H   multivitamin  1 tablet Per Tube QHS   mouth rinse  15 mL Mouth Rinse Q2H   pantoprazole (PROTONIX) IV  40 mg Intravenous Q24H   rifaximin  550 mg  Per Tube BID   sodium chloride flush  10-40 mL Intracatheter Q12H   thiamine  100 mg Per Tube Daily   Continuous Infusions:   prismasol BGK 4/2.5 400 mL/hr at 07/02/22 0127    prismasol BGK 4/2.5 400 mL/hr at 07/02/22 0127   sodium chloride 5 mL/hr at 07/02/22 0900   anticoagulant sodium citrate     feeding supplement (VITAL 1.5 CAL) 55 mL/hr at 07/02/22 0900   lactated ringers 10 mL/hr at 07/02/22 0900   meropenem (MERREM) IV Stopped (07/01/22 2232)   prismasol BGK 4/2.5 1,500 mL/hr at 07/02/22 0928   vancomycin Stopped (07/01/22 1230)   PRN Meds:.sodium chloride, acetaminophen (TYLENOL) oral liquid 160 mg/5 mL, alteplase, anticoagulant sodium citrate, fentaNYL (SUBLIMAZE) injection, heparin, heparin, lidocaine (PF), lidocaine-prilocaine, lip balm, LORazepam, mouth rinse, pentafluoroprop-tetrafluoroeth, sodium chloride, sodium chloride flush, white petrolatum  ABG    Component Value Date/Time   PHART 7.502 (H) 06/15/2022 2238   PCO2ART 43.8 06/15/2022 2238   PO2ART 69 (L) 06/15/2022 2238   HCO3 34.5 (H) 06/15/2022 2238   TCO2 36 (H) 06/15/2022 2238   ACIDBASEDEF 4.0 (H) 05/30/2022 0453   O2SAT 95 06/15/2022 2238    A/P  Dialysis dependent AKI on CRRT AMS / Encephalopathy  Listeria meningitis / bacteremia s/p amp gent MSSA bacteremia s/p amp/gent Klebsiella HAP Seizures  Cont CRRT but would be reasonable to stop, normotensive and solute status stable Palliative following, she is a very poor candidate for long term iHD  D/w CCM they will let me know if want to kee pCRRT running during vent ween or not Will follow closely  Pearson Grippe, MD [employer] pgr [phone]

## 2022-07-02 NOTE — Progress Notes (Signed)
Pharmacy Antibiotic Note  Deanna Mcmillan is a 51 y.o. female admitted on 06/17/2022 with Listeria meningitis and has been on ampicillin monotherapy. Pharmacy consulted for Merrem dosing in setting of Klebsiella oxytoca pneumonia. After starting therapy, patient is afebrile and wbc is trending down. Patient remains on CRRT at this time.   Plan: Continue Merrem 2gm IV Q12H while on CRRT Follow-up LOT (d5) Monitor CRRT tolerance/interruption, seizure activity, clinical progress  Height: 5\' 4"  (162.6 cm) Weight: 64.9 kg (143 lb 1.3 oz) IBW/kg (Calculated) : 54.7  Temp (24hrs), Avg:98.2 F (36.8 C), Min:96.8 F (36 C), Max:99.5 F (37.5 C)  Recent Labs  Lab 06/28/22 0810 06/28/22 1134 06/28/22 1434 06/29/22 0532 06/29/22 1631 06/30/22 0125 06/30/22 1644 07/01/22 0242 07/01/22 1606 07/02/22 0301  WBC 32.1*  --   --  42.0*  --  31.9*  --  28.3*  --  23.6*  CREATININE 2.77*  --   --  3.11*   < > 1.49* 0.96 0.80 0.63 0.58  LATICACIDVEN  --  2.1* 1.7  --   --   --   --   --   --   --    < > = values in this interval not displayed.    Estimated Creatinine Clearance: 71 mL/min (by C-G formula based on SCr of 0.58 mg/dL).    No Known Allergies   Vanc 11/7 >> 11/11, 12/7 >>12/11 CFP 11/7 >> 11/8  Flagyl 11/7 >> 11/9  CTX 11/8 >>11/11 Ampicillin 11/8 >>11/27 Gent 11/8 >> 11/24 Ancef 11/11 >>11/22 Linezolid 11/24 >> 11/27 Cefazolin 11/29 >>11/27 Meropenem 11/27>12/4, 12/7 >>  Ampicillin 12/4 >> 12/7   11/7 BCx - Listeria, Staph aureus, staph epi via BCID  11/7 UCx > E.coli pan sens 11/8 para cx - negative  11/8 BCx - negative 11/8 TA - MSSA 11/10 listeria in blood and CSF 11/24 bcx: neg 11.25 TA: rare kleb oxy (ESBL) 11/27 csf - neg 11/29 CSF - neg   11/10 GP/GT 6.9/4.3 >> decr to 80mg  q12 11/13-11/14: GP/GT 5.2/1.6 >> decr to 40mg  q12 11/17 GP/GT 6.3/1.1 >> decr  to 20mg  q12 11/20-11/21 GP/GT 1.4/<0.5 >> inc to 60 mg q24h  Sloan Leiter, PharmD, BCPS,  BCCCP Clinical Pharmacist Please refer to [facility] for [place] numbers 07/02/2022, 2:37 PM

## 2022-07-02 NOTE — Progress Notes (Addendum)
NAME:  Deanna Mcmillan, MRN:  [id number], DOB:  [birth year], LOS: 90 ADMISSION DATE:  05/24/2022, CONSULTATION DATE:  11/7 REFERRING MD:  Lendon Colonel, EDP CHIEF COMPLAINT:  AMS   BRIEF  51 yo woman with hx of recent diagnosis of alcoholic hepatitis (dx 39/76), here with ams, fever.  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 Seizure-EEG neg Started on pulse steroids per neuro for autoimmune cause    Recently seen at [facility] 10/19: LE Edema. Lasix not helpful at home.  Admitted for diuresis.  Jaundice and abd distension also noted.  Pleural effusion.  GI saw: non enough ascites for tap  Started lasix and spironolactone.  28 day course of prednisolone, miralax, lactulose, miralax. Midodrine TID     Pertinent  Medical History  ETOH Cirrhosis BLE edema recently worsening  S/p lap appy 2014    Echo: recent normal 73/4193   Home meds: folic ascid, acetaminohpen, lasix 40bid, lactulose 30 TID protonix, prednisone (04/29/22), prednisolone 04/28/22 three week course, thiamine, simethicone,    Per her mother she had been drinking at least one bottle of wine daily  Significant Hospital Events: Including procedures, antibiotic start and stop dates in addition to other pertinent events   11/7: Intubated in ED and admitted to ICU 11/7: CTH neg, CT A/P cirrhosis with portal hypertension, splenomegaly, moderate volume ascites, moderate bilateral pleural effusion Blood culture - Listeria and MSSA and MSSE (per mom health dept thinks she got it at PF chang but patient does love cheese) Urine culture - Pan sensitive E colii 11/8 cutlures Trach - MSSA Blood - neg 11/9 KUB: No ileus 11/10: LP confirms listeria meningitis 11/11 KUB: ileus 11/12 ileus resolved with aggressive bowel regimen 11/15 KUB: No ileus. 11/18 more  awake, following commands 1121 RUE PICC 11/22 extubated to BiPAP 11/23 - 14d of Rx complerted for MSSA/MESSE. For listeria - ID wants to extend amp/gent to 3 weeks 11/8-11/29. Ileus post extubation and NG to LIS . [place] increased. BP sofft an dlevophed and alb Rx.   11/24 -  OG placed yesterday for ileus and bile returns. TF on hold. Lactulose increased yesterday. REamins off vent. Did not need BiPAP. This AM more obtunded but mom thinks was awake at night and is sleeping due to fatigue. Afebrile.  But wBC up 13L. Neg baklance -3L:  OFF PRESSORs = Ammonia up at 72 - [place] mild periphepatic ascites Blood culture  11/25 - Now intubated due to obtunded encephaopathy. CTAbc with SBO v Ileus with transition ponit . CCS consult - no surgery but ok for rectal lactulose w ith continue NG/OG tube to LIS. ID ordered repeat blood culture. Linezolid added.  No ntrition since 06/14/22 Pm. Creat worsening. LEss Ur OP. On levophed 37mcg. Not on sedation. Vent 40% Ammonia 108 Trach aspirate -Klebsiella oxytoca 11/27 - Remains intubated. On propofol 53mcg and levophed 35mcg. Febrile up to 101.45F overnight. On ampicillin and linezolid, going for IR guided LP today. Kidney function improving, repleting potassium. No seizure activity noted overnight on EEG. Surgery following for ileus, no need for surgical intervention. Holding TF for today.  11/29 - started on pulse dose steroids, getting autoimmune workup, CSF studies 12/3 - finished steroids, workup negative thus far 12/7 -  Paracentesis -rare WBC no organisms seen Blood culture  Tracheal aspirate: -Few Klebsiella oxytoca and few Candida LUSITANIAE 12/8 -  CRRT started.  Remains encephalopathic. Febrile to 101.32F overnight, worsening leukocytosis. Paracentesis performed yesterday, without any notable findings thus far. On empiric vancomycin and merrem.  Starting CRRT today per nephrology.  12/9 -on the ventilator FiO2 40%.  Getting tube feeds.  On CRRT.  Not on  pressors.  afebrile since 06/28/2022.  White count reduced to 32 K.  Neurology signed off.  Discontinued LTM.  Keppra has been increased to 1000 mg twice daily during the duration of CRRT.  Anasarca has improved.  Mother at the bedside. EEG off.  Interim History / Subjective:   Remains unresponsive, neuro exam relatively unchanged for the past week. She is tolerating PS 5, PEEP 5. Discussed again with mother about poor prognosis given lack of improvement despite improvement in uremia and ICU stay approaching 5 weeks. Tentative plan for family meeting tomorrow to discuss next steps.     Blood pressure 131/73, pulse 97, temperature (!) 97.5 F (36.4 C), resp. rate (!) 24, height 5\' 4"  (1.626 m), weight 64.9 kg, SpO2 100 %.    Vent Mode: PSV;CPAP FiO2 (%):  [40 %] 40 % Set Rate:  [20 bmp] 20 bmp Vt Set:  [420 mL] 420 mL PEEP:  [5 cmH20] 5 cmH20 Pressure Support:  [5 cmH20] 5 cmH20 Plateau Pressure:  [14 cmH20-16 cmH20] 14 cmH20   Intake/Output Summary (Last 24 hours) at 07/02/2022 1422 Last data filed at 07/02/2022 1400 Gross per 24 hour  Intake 2079.94 ml  Output 5247.8 ml  Net -3167.86 ml   Filed Weights   07/01/22 0500 07/01/22 0700 07/02/22 0128  Weight: 68.9 kg 68.9 kg 64.9 kg    General: critically ill middle aged female, laying in bed, on CRRT, NAD. HENT: [place]/AT, G tube and ETT in place. Eyes: PERRL, jaundiced. CV: normal rate and regular rhythm, no murmurs. Pulm: CTABL, synchronous with ventilator. Tolerating PS 5, PEEP 5.  Abdomen: soft, mildly distended, normoactive bowel sounds. Extremities: anasarca. Neuro: RASS -5, unchanged despite no ongoing sedation. Turns head to noxious stimuli applied to shoulders, otherwise no response to noxious stimuli distally. No purposeful movements. Unable to follow commands. Areflexic.    Resolved Hospital Problem list   Hypokalemia Hypophosphatemia Hypernatremia Thrombocytopenia Ileus MSSA bacteremia Listeria  bacteremia Klebsiella HAP Circulatory Shock   Assessment & Plan:   Metabolic Encephalopathy Likely multifactorial 2/2 cirrhosis with ascites, GTC seizures, uremia, and listeria meningoencephalitis. Day-to-day exams remain unchanged despite improvement in uremia, lack of ongoing seizure activity, treatment of listeria, and improvement in ammonia levels. Grim prognosis. Neurology is no longer following. -appreciate ongoing ID, nephrology, palliative care efforts  -family meeting 12/12  Listeria meningoencephalitis MDR Klebsiella HAP Afebrile for past 2 days and leukocytosis improving. Has been on a long course of antibiotics (including broad spectrum) since admission. Listeria bacteremia and MSSA bacteremia have been treated per ID. Per ID, listeria CNS disease can remain and treatment can be up to 6 weeks (currently on week 5 of treatment). Was again noted to have Klebsiella oxytoca on respiratory cultures, unclear if colonizer.  -appreciate ID assistance -continue meropenem -pending improvement, would need TEE after extubation  Cirrhosis with ascites [MELD-Na 27, Child Pugh Class C] Cirrhosis present on admission. Received paracentesis 12/7, negative cultures. Repeat ammonia level <10. Independently a poor prognostic indicator for overall outcome. -lactulose, rifaximin  GTC Seizures (11/26, 12/5) Has had 2 GTC seizures in-hospital. No seizure activity captured on LTM x2.  -keppra 1g BID while on CRRT  Acute hypoxic respiratory failure  Given lack of ability to  wean off ventilator, anticipate that patient may need ventilatory support for weeks to months. She has been tolerating weaning efforts over the past 2 days. Will await family meeting tomorrow to decide course of action if fails extubation (trach placement vs transitioning to comfort focused care). -family meeting 12/12 -trial of extubation tomorrow  Acute Renal Failure (Baseline Cr 0.6- 0.9) - improved Severe Uremia -  improved Metabolic acidosis - improved Anasarca Kidney function has improved and remained stable over the past 2-3 days, albeit with CRRT support for metabolic clearance. Continues to have anasarca. No improvement in neurological status despite CRRT. -appreciate nephrology assistance -CRRT  Hyperglycemia, controlled -resistant SSI -TF coverage with 8u q4h -levemir 10u BID -goal BG 140-180  Severe physical deconditioning Protein caloric malnutrition, mild She has remained bedbound in the ICU for over 1 month now, has profound neuromuscular weakness. Anticipate that she will need months of physical therapy IF she has any clinically significant neurological recovery moving forward. -TF at goal -She will need aggressive PT/OT (likely CIR) for strength training if clinically improves to a point where she can participate  Prolonged QTc interval  Hx of SVT - monitor - avoid QTc prolonging medications  Anemia - chronic (baseline hgb 8-9 in oct 2023) -trend hgb curve -transfuse if hgb <7 and/or hemodynamic instability   Best Practice (right click and "Reselect all SmartList Selections" daily)   Diet/type: tubefeeds  DVT prophylaxis: prophylactic heparin  GI prophylaxis: PPI Lines: Central line - HD cath R fem 06/26/22 Foley:  removed 06/26/22 Code Status:  full code Last date of multidisciplinary goals of care discussion [updated mother at bedside]  [place] discussions have been held with mother, will try to see if any improvement occurs with resolution of uremia via CRRT. If no improvement, family appears to be open to transitioning to comfort-focused care. Family meeting tentatively planned for 07/03/2022.   ATTESTATION & SIGNATURE   Virl Axe, MD [facility], PGY-3 07/02/2022, 2:22 PM   LABS   CBC Recent Labs  Lab 06/30/22 0125 07/01/22 0242 07/02/22 0301  HGB 7.4* 7.5* 7.9*  HCT 22.1* 23.7* 24.8*  WBC 31.9* 28.3* 23.6*  PLT 153 172 183   COAGULATION Recent  Labs  Lab 07/01/22 0242 07/02/22 0301  INR 1.2 1.1   CHEMISTRY Recent Labs  Lab 06/26/22 0205 06/27/22 0423 06/28/22 0810 06/30/22 0125 06/30/22 1644 07/01/22 0242 07/01/22 1606 07/02/22 0301  NA 141 134*   < > 138 139 137 134* 135  K 4.1 3.7   < > 4.1 4.2 4.6 4.4 4.6  CL 103 95*   < > 101 99 101 99 101  CO2 19* 20*   < > 22 23 24 24 24   GLUCOSE 299* 211*   < > 120* 59* 119* 140* 134*  BUN 168* 116*   < > 72* 44* 32* 25* 21*  CREATININE 3.26* 2.34*   < > 1.49* 0.96 0.80 0.63 0.58  CALCIUM 8.2* 9.1   < > 8.2* 8.4* 8.4* 8.0* 8.3*  MG 2.7* 2.2  --  2.4  --  2.5*  --  2.5*  PHOS 6.6* 6.6*   < > 5.0* 4.0 3.2 2.9 2.8   < > = values in this interval not displayed.   Estimated Creatinine Clearance: 71 mL/min (by C-G formula based on SCr of 0.58 mg/dL).   LIVER Recent Labs  Lab 06/28/22 0810 06/29/22 0532 06/29/22 1631 06/30/22 0125 06/30/22 1644 07/01/22 0242 07/01/22 1606 07/02/22 0301  AST 80* 98*  --  90*  --  81*  --  71*  ALT 30 30  --  32  --  33  --  36  ALKPHOS 105 142*  --  165*  --  195*  --  236*  BILITOT 3.1* 3.3*  --  2.9*  --  2.8*  --  2.6*  PROT 5.6* 5.7*  --  5.6*  --  5.8*  --  6.0*  ALBUMIN 2.4* 2.2*   < > 2.1*  2.1* 2.1* 2.0* 2.0* 2.1*  2.1*  INR  --   --   --   --   --  1.2  --  1.1   < > = values in this interval not displayed.    Recent Labs  Lab 07/01/22 0242 07/02/22 0301  INR 1.2 1.1   ENDOCRINE CBG (last 3)  Recent Labs    07/02/22 0311 07/02/22 0755 07/02/22 1133  GLUCAP 127* 115* 142*

## 2022-07-03 DIAGNOSIS — G9340 Encephalopathy, unspecified: Secondary | ICD-10-CM | POA: Diagnosis not present

## 2022-07-03 DIAGNOSIS — A419 Sepsis, unspecified organism: Secondary | ICD-10-CM | POA: Diagnosis not present

## 2022-07-03 DIAGNOSIS — J9601 Acute respiratory failure with hypoxia: Secondary | ICD-10-CM | POA: Diagnosis not present

## 2022-07-03 DIAGNOSIS — N179 Acute kidney failure, unspecified: Secondary | ICD-10-CM | POA: Diagnosis not present

## 2022-07-03 DIAGNOSIS — N186 End stage renal disease: Secondary | ICD-10-CM

## 2022-07-03 LAB — CULTURE, BLOOD (ROUTINE X 2)
Culture: NO GROWTH
Culture: NO GROWTH
Special Requests: ADEQUATE
Special Requests: ADEQUATE
Special Requests: ADEQUATE
Special Requests: ADEQUATE

## 2022-07-03 LAB — RENAL FUNCTION PANEL
Albumin: 2.4 g/dL — ABNORMAL LOW (ref 3.5–5.0)
Anion gap: 13 (ref 5–15)
BUN: 19 mg/dL (ref 6–20)
CO2: 25 mmol/L (ref 22–32)
Calcium: 9.1 mg/dL (ref 8.9–10.3)
Chloride: 99 mmol/L (ref 98–111)
Creatinine, Ser: 0.56 mg/dL (ref 0.44–1.00)
GFR, Estimated: >60 mL/min (ref >60)
Glucose, Bld: 74 mg/dL (ref 70–99)
Phosphorus: 2.9 mg/dL (ref 2.5–4.6)
Potassium: 5 mmol/L (ref 3.5–5.1)
Sodium: 137 mmol/L (ref 135–145)

## 2022-07-03 LAB — GLUCOSE, CAPILLARY
Glucose-Capillary: 107 mg/dL — ABNORMAL HIGH (ref 70–99)
Glucose-Capillary: 110 mg/dL — ABNORMAL HIGH (ref 70–99)
Glucose-Capillary: 136 mg/dL — ABNORMAL HIGH (ref 70–99)
Glucose-Capillary: 137 mg/dL — ABNORMAL HIGH (ref 70–99)
Glucose-Capillary: 147 mg/dL — ABNORMAL HIGH (ref 70–99)
Glucose-Capillary: 165 mg/dL — ABNORMAL HIGH (ref 70–99)
Glucose-Capillary: 29 mg/dL — CL (ref 70–99)
Glucose-Capillary: 57 mg/dL — ABNORMAL LOW (ref 70–99)

## 2022-07-03 LAB — CBC
HCT: 26.4 % — ABNORMAL LOW (ref 36.0–46.0)
Hemoglobin: 8.7 g/dL — ABNORMAL LOW (ref 12.0–15.0)
MCH: 38 pg — ABNORMAL HIGH (ref 26.0–34.0)
MCHC: 33 g/dL (ref 30.0–36.0)
MCV: 115.3 fL — ABNORMAL HIGH (ref 80.0–100.0)
Platelets: 201 10*3/uL (ref 150–400)
RBC: 2.29 MIL/uL — ABNORMAL LOW (ref 3.87–5.11)
RDW: 20.3 % — ABNORMAL HIGH (ref 11.5–15.5)
WBC: 24.1 10*3/uL — ABNORMAL HIGH (ref 4.0–10.5)
nRBC: 0.1 % (ref 0.0–0.2)

## 2022-07-03 LAB — MAGNESIUM: Magnesium: 2.7 mg/dL — ABNORMAL HIGH (ref 1.7–2.4)

## 2022-07-03 MED ORDER — DEXTROSE 10 % IV SOLN: INTRAVENOUS | Status: DC | PRN

## 2022-07-03 MED ORDER — DEXTROSE 50 % IV SOLN: 25.0000 g | INTRAVENOUS | Status: AC

## 2022-07-03 MED ORDER — DEXTROSE 50 % IV SOLN
INTRAVENOUS | Status: AC
  Administered 2022-07-03: 25 g via INTRAVENOUS
  Filled 2022-07-03: qty 50

## 2022-07-03 MED ORDER — INSULIN ASPART 100 UNIT/ML IJ SOLN
4.0000 [IU] | INTRAMUSCULAR | Status: DC
  Administered 2022-07-03 – 2022-07-04 (×5): 4 [IU] via SUBCUTANEOUS

## 2022-07-03 MED ORDER — LEVETIRACETAM 500 MG PO TABS
500.0000 mg | ORAL_TABLET | Freq: Two times a day (BID) | ORAL | Status: DC
  Administered 2022-07-03 – 2022-07-04 (×2): 500 mg
  Filled 2022-07-03 (×2): qty 1

## 2022-07-03 MED ORDER — INSULIN ASPART 100 UNIT/ML IJ SOLN
0.0000 [IU] | INTRAMUSCULAR | Status: DC
  Administered 2022-07-03 – 2022-07-04 (×3): 1 [IU] via SUBCUTANEOUS

## 2022-07-03 MED ORDER — SODIUM CHLORIDE 0.9 % IV SOLN
1.0000 g | INTRAVENOUS | Status: DC
  Administered 2022-07-04: 1 g via INTRAVENOUS
  Filled 2022-07-03: qty 20

## 2022-07-03 NOTE — Progress Notes (Signed)
Pt placed on PSV 5/5 per wean protocol. Pt is tolerating well at this time. RT to continue to monitor. 

## 2022-07-03 NOTE — Progress Notes (Signed)
Patient ID: [name], female   DOB: 10/21/1970, 51 y.o.   MRN: [id number]    Progress Note from the Palliative Medicine Team at [facility]   Patient Name: Deanna Mcmillan        Date: 07/03/2022 DOB: Sep 29, 1970  Age: 51 y.o. MRN#: [id number] Attending Physician: Jacky Kindle, MD Primary Care Physician: Mariel Sleet Admit Date: 06/18/2022   Medical records reviewed,  discussed with nursing   51 y.o. female  with past medical history of ETOH cirrhosis admitted on 06/08/2022 with severe sepsis and acute metabolic encephalopathy. 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 With acute respiratory failure as well - extubated 11/22. PMT consulted to discuss [place].      11/7: Intubated in ED and admitted to ICU 11/7: CTH neg, CT A/P cirrhosis with portal hypertension, splenomegaly, moderate volume ascites, moderate bilateral pleural effusion Blood culture - Listeria and MSSA and MSSE (per mom health dept thinks she got it at [facility] but patient does love cheese) Urine culture - Pan sensitive E colii 11/8 cutlures Trach - MSSA Blood - neg 11/9 KUB: No ileus 11/10: LP confirms listeria meningitis 11/11 KUB: ileus 11/12 ileus resolved with aggressive bowel regimen 11/15 KUB: No ileus. 11/18 more awake, following commands 1121 RUE PICC 11/22 extubated to BiPAP 11/23 - 14d of Rx complerted for MSSA/MESSE. For listeria - ID wants to extend amp/gent to 3 weeks 11/8-11/29. Ileus post extubation and NG to LIS . [place] increased. BP sofft an dlevophed and alb Rx.    11/24 -  OG placed yesterday for ileus and bile returns. TF on hold. Lactulose increased yesterday. REamins off vent. Did not need BiPAP. This AM more obtunded but mom thinks was awake at night and is sleeping due to fatigue. Afebrile.  But wBC up 13L. Neg baklance -3L:  OFF PRESSORs = Ammonia up at 72 - [place] mild periphepatic ascites Blood culture  11/25 - Now intubated due to obtunded encephaopathy. CTAbc with SBO v  Ileus with transition ponit . CCS consult - no surgery but ok for rectal lactulose w ith continue NG/OG tube to LIS. ID ordered repeat blood culture. Linezolid added.  No ntrition since 06/14/22 Pm. Creat worsening. LEss Ur OP. On levophed 43mg. Not on sedation. Vent 40% Ammonia 108 Trach aspirate -Klebsiella oxytoca 11/27 - Remains intubated. On propofol 197m and levophed 2454m Febrile up to 101.25F overnight. On ampicillin and linezolid, going for IR guided LP today. Kidney function improving, repleting potassium. No seizure activity noted overnight on EEG. Surgery following for ileus, no need for surgical intervention. Holding TF for today.  11/29 - started on pulse dose steroids, getting autoimmune workup, CSF studies 12/3 - finished steroids, workup negative thus far 12/7 -  Paracentesis -rare WBC no organisms seen Blood culture  Tracheal aspirate: -Few Klebsiella oxytoca and few Candida LUSITANIAE 12/8 - CRRT started.  Remains encephalopathic. Febrile to 101.58F overnight, worsening leukocytosis. Paracentesis performed yesterday, without any notable findings thus far. On empiric vancomycin and merrem.  Starting CRRT today per nephrology.   12/9 -on the ventilator FiO2 40%.  Getting tube feeds.  On CRRT.  Not on pressors.  afebrile since 06/28/2022.  White count reduced to 32 K.  Neurology signed off.  Discontinued LTM.  Keppra has been increased to 1000 mg twice daily during the duration of CRRT.  Anasarca has improved.   07-02-22  remains unresponsive, neuro exam unchanged--family face advanced directive decisions   This NP assessed patient at the bedside as  a follow up for palliative medicine needs and emotional support and to met with large family for [facility] meeting.  Patient's mother and son/[place], two brothers,  their wives and two nieces present for meeting   Detailed medical update Education offered regarding current medical situation and likely associated poor prognosis. Education  offered on the difference between a full medical support path and a comfort path for [employer] Education offered on the likely trajectory after liberation from ventilator.  Education offered on utilization of medication to treat EOL symptoms. Questions and concerns addressed   Plan of Care:  - Plan is for liberation from the ventilatory tomorrow at 12:00, family will gather at that time.  Focus of care will be comfort and dignity allowing for a natural death.     - Family wish to explore organ donation options prior to extubation, discussed with nursing and order written  -family requesting autopsy, placed call to pathology    Emotional support offered    Family meeting is scheduled for tomorrow at 12:00.   Large family to attend   Questions and concerns addressed   Discussed with Dr Tacy Learn  80  minutes   Wadie Lessen NP  Palliative Medicine Team Team Phone # [phone] Pager [phone]

## 2022-07-03 NOTE — Progress Notes (Signed)
Admit: 05/28/2022 LOS: 17  42F alcohoic cirrhosis, encephalopathy, severe dialysis dependent AKI   Subjective:  CRRT stopped earlier this AM Remains encephalopathic GOC meetign with CCM later today No UOP  AF, BPs stable K 5.0, P 2.9  12/11 0701 - 12/12 0700 In: 2177.1 [I.V.:402; NG/GT:1445; IV Piggyback:280.1] Out: 4364 [Stool:150]  Filed Weights   07/01/22 0700 07/02/22 0128 07/03/22 0500  Weight: 68.9 kg 64.9 kg 62.9 kg    Scheduled Meds:  Chlorhexidine Gluconate Cloth  6 each Topical Q0600   darbepoetin (ARANESP) injection - DIALYSIS  150 mcg Subcutaneous Q Wed-1800   feeding supplement (PROSource TF20)  60 mL Per Tube Daily   folic acid  1 mg Per Tube Daily   heparin  5,000 Units Subcutaneous Q8H   insulin aspart  0-20 Units Subcutaneous Q4H   insulin aspart  8 Units Subcutaneous Q4H   insulin detemir  10 Units Subcutaneous BID   lactulose  10 g Per Tube Daily   leptospermum manuka honey  1 Application Topical QHS   levETIRAcetam  1,000 mg Per Tube BID   midodrine  10 mg Per Tube Q8H   multivitamin  1 tablet Per Tube QHS   mouth rinse  15 mL Mouth Rinse Q2H   pantoprazole (PROTONIX) IV  40 mg Intravenous Q24H   rifaximin  550 mg Per Tube BID   sodium chloride flush  10-40 mL Intracatheter Q12H   thiamine  100 mg Per Tube Daily   Continuous Infusions:   prismasol BGK 4/2.5 400 mL/hr at 07/03/22 0252    prismasol BGK 4/2.5 400 mL/hr at 07/03/22 0252   sodium chloride 5 mL/hr at 07/03/22 0700   anticoagulant sodium citrate     feeding supplement (VITAL 1.5 CAL) 55 mL/hr at 07/03/22 0700   lactated ringers 10 mL/hr at 07/03/22 0700   meropenem (MERREM) IV Stopped (07/02/22 2306)   prismasol BGK 4/2.5 1,500 mL/hr at 07/03/22 0532   PRN Meds:.sodium chloride, acetaminophen (TYLENOL) oral liquid 160 mg/5 mL, alteplase, anticoagulant sodium citrate, fentaNYL (SUBLIMAZE) injection, heparin, heparin, lidocaine (PF), lidocaine-prilocaine, lip balm, LORazepam, mouth rinse,  pentafluoroprop-tetrafluoroeth, sodium chloride, sodium chloride flush, white petrolatum  Current Labs: reviewed    Physical Exam:  Blood pressure 119/63, pulse (!) 101, temperature 98.9 F (37.2 C), temperature source Oral, resp. rate (!) 22, height 5\' 4"  (1.626 m), weight 62.9 kg, SpO2 100 %. Intubated, not responsive Tachy, regular Coarse bs b/l S/nd, no r/g Extensive ecchymoses throughout No FC, not awake, not responsive to verbal stimulus R Temp IJ HD cath  A Dialysis dependent AKI  CRRT 12/8 -- 12/12 [employer] meeting today prior to any iHD, not needed today She would have high morbidity on iHD long term, limited benefit AMS / Encephalopathy, not improving Alcoholic cirrhosis Listeria meningitis / bacteremia s/p amp gent MSSA bacteremia s/p amp/gent Klebsiella HAP Seizures  P Again today I have let mother [name] know that HD is of very limited benefit for patients with severe liver disease and that I am uncertain that ongoign HD will be overall beneficial Agree with [place] meeting today No HD needed Will follow along Medication Issues; Preferred narcotic agents for pain control are hydromorphone, fentanyl, and methadone. Morphine should not be used.  Baclofen should be avoided Avoid oral sodium phosphate and magnesium citrate based laxatives / bowel preps    Pearson Grippe MD 07/03/2022, 9:20 AM  Recent Labs  Lab 07/02/22 0301 07/02/22 1549 07/03/22 0330  NA 135 136 137  K 4.6 4.6 5.0  CL 101 101 99  CO2 24 26 25   GLUCOSE 134* 159* 74  BUN 21* 20 19  CREATININE 0.58 0.57 0.56  CALCIUM 8.3* 8.6* 9.1  PHOS 2.8 2.9 2.9   Recent Labs  Lab 06/29/22 0532 06/30/22 0125 07/01/22 0242 07/02/22 0301 07/03/22 0330  WBC 42.0*   < > 28.3* 23.6* 24.1*  NEUTROABS 42.0*  --   --   --   --   HGB 7.9*   < > 7.5* 7.9* 8.7*  HCT 24.3*   < > 23.7* 24.8* 26.4*  MCV 113.6*   < > 117.9* 117.5* 115.3*  PLT 172   < > 172 183 201   < > = values in this interval not displayed.

## 2022-07-03 NOTE — Progress Notes (Signed)
NAME:  Deanna Mcmillan, MRN:  [id number], DOB:  05-19-1971, LOS: 63 ADMISSION DATE:  06/14/2022, CONSULTATION DATE:  11/7 REFERRING MD:  Lendon Colonel, EDP CHIEF COMPLAINT:  AMS   BRIEF  51 yo woman with hx of recent diagnosis of alcoholic hepatitis (dx 02/72), here with ams, fever.  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 Seizure-EEG neg Started on pulse steroids per neuro for autoimmune cause    Recently seen at [facility] 10/19: LE Edema. Lasix not helpful at home.  Admitted for diuresis.  Jaundice and abd distension also noted.  Pleural effusion.  GI saw: non enough ascites for tap  Started lasix and spironolactone.  28 day course of prednisolone, miralax, lactulose, miralax. Midodrine TID     Pertinent  Medical History  ETOH Cirrhosis BLE edema recently worsening  S/p lap appy 2014    Echo: recent normal 53/6644   Home meds: folic ascid, acetaminohpen, lasix 40bid, lactulose 30 TID protonix, prednisone (04/29/22), prednisolone 04/28/22 three week course, thiamine, simethicone,    Per her mother she had been drinking at least one bottle of wine daily  Significant Hospital Events: Including procedures, antibiotic start and stop dates in addition to other pertinent events   11/7: Intubated in ED and admitted to ICU 11/7: CTH neg, CT A/P cirrhosis with portal hypertension, splenomegaly, moderate volume ascites, moderate bilateral pleural effusion Blood culture - Listeria and MSSA and MSSE (per mom health dept thinks she got it at PF chang but patient does love cheese) Urine culture - Pan sensitive E colii 11/8 cutlures Trach - MSSA Blood - neg 11/9 KUB: No ileus 11/10: LP confirms listeria meningitis 11/11 KUB: ileus 11/12 ileus resolved with aggressive bowel regimen 11/15 KUB: No ileus. 11/18 more  awake, following commands 1121 RUE PICC 11/22 extubated to BiPAP 11/23 - 14d of Rx complerted for MSSA/MESSE. For listeria - ID wants to extend amp/gent to 3 weeks 11/8-11/29. Ileus post extubation and NG to LIS . [place] increased. BP sofft an dlevophed and alb Rx.   11/24 -  OG placed yesterday for ileus and bile returns. TF on hold. Lactulose increased yesterday. REamins off vent. Did not need BiPAP. This AM more obtunded but mom thinks was awake at night and is sleeping due to fatigue. Afebrile.  But wBC up 13L. Neg baklance -3L:  OFF PRESSORs = Ammonia up at 72 - [place] mild periphepatic ascites Blood culture  11/25 - Now intubated due to obtunded encephaopathy. CTAbc with SBO v Ileus with transition ponit . CCS consult - no surgery but ok for rectal lactulose w ith continue NG/OG tube to LIS. ID ordered repeat blood culture. Linezolid added.  No ntrition since 06/14/22 Pm. Creat worsening. LEss Ur OP. On levophed 58mcg. Not on sedation. Vent 40% Ammonia 108 Trach aspirate -Klebsiella oxytoca 11/27 - Remains intubated. On propofol 9mcg and levophed 38mcg. Febrile up to 101.19F overnight. On ampicillin and linezolid, going for IR guided LP today. Kidney function improving, repleting potassium. No seizure activity noted overnight on EEG. Surgery following for ileus, no need for surgical intervention. Holding TF for today.  11/29 - started on pulse dose steroids, getting autoimmune workup, CSF studies 12/3 - finished steroids, workup negative thus far 12/7 -  Paracentesis -rare WBC no organisms seen Blood culture  Tracheal aspirate: -Few Klebsiella oxytoca and few Candida LUSITANIAE 12/8 -  CRRT started.  Remains encephalopathic. Febrile to 101.26F overnight, worsening leukocytosis. Paracentesis performed yesterday, without any notable findings thus far. On empiric vancomycin and merrem.  Starting CRRT today per nephrology.  12/9 -on the ventilator FiO2 40%.  Getting tube feeds.  On CRRT.  Not on  pressors.  afebrile since 06/28/2022.  White count reduced to 32 K.  Neurology signed off.  Discontinued LTM.  Keppra has been increased to 1000 mg twice daily during the duration of CRRT.  Anasarca has improved.  Mother at the bedside. EEG off.  Interim History / Subjective:   She is again tolerating weaning on PSV. Family meeting held today with plan for liberalization from ventilator tomorrow at 12PM and subsequent comfort focused care. Will continue full scope of care today.   Exam remains unchanged from yesterday and labs are stable. CRRT stopped today.     Blood pressure 123/62, pulse (!) 112, temperature 100.1 F (37.8 C), temperature source Oral, resp. rate (!) 29, height 5\' 4"  (1.626 m), weight 62.9 kg, SpO2 98 %.    Vent Mode: PRVC FiO2 (%):  [40 %] 40 % Set Rate:  [20 bmp] 20 bmp Vt Set:  [420 mL-430 mL] 430 mL PEEP:  [5 cmH20] 5 cmH20 Pressure Support:  [5 cmH20] 5 cmH20 Plateau Pressure:  [14 cmH20-16 cmH20] 16 cmH20   Intake/Output Summary (Last 24 hours) at 07/03/2022 1436 Last data filed at 07/03/2022 1015 Gross per 24 hour  Intake 1599.63 ml  Output 2998.2 ml  Net -1398.57 ml   Filed Weights   07/01/22 0700 07/02/22 0128 07/03/22 0500  Weight: 68.9 kg 64.9 kg 62.9 kg   General: terminally ill middle aged female, laying in bed, NAD. HENT: [place]/AT, G tube and ETT in place. Eyes: jaundiced. CV: normal rate and regular rhythm. Pulm: CTABL, tolerating PS 5/5. Abdomen: soft, mildly distended, normoactive bowel sounds. Extremities: anasarca. Neuro: RASS -5, no sedation. No purposeful movements. Not following commands. Turns head to noxious stimuli to shoulders, but no response to noxious stimuli of extremities.    Resolved Hospital Problem list   Hypokalemia Hypophosphatemia Hypernatremia Thrombocytopenia Ileus MSSA bacteremia Listeria bacteremia Klebsiella HAP Circulatory Shock   Assessment & Plan:   Metabolic Encephalopathy Likely multifactorial 2/2  cirrhosis with ascites, GTC seizures, uremia, and listeria meningoencephalitis. Day-to-day exams remain unchanged despite improvement in uremia, lack of ongoing seizure activity, treatment of listeria, and improvement in ammonia levels. Grim prognosis. Neurology is no longer following. Family meeting held today, continue full scope of care through tonight with plan to liberalize from ventilator tomorrow at 12PM with subsequent comfort focused care. -appreciate palliative care assistance -full scope of care through tonight -terminal extubation tomorrow at 12PM with subsequent comfort focused care -family wish to explore organ donation options, will reach out to [employer] -family requesting autopsy, palliative care has reached out to pathology  Listeria meningoencephalitis MDR Klebsiella HAP Afebrile for past 2 days and leukocytosis stable. Has been on a long course of antibiotics (including broad spectrum) since admission. Listeria bacteremia and MSSA bacteremia have been treated per ID. Was again noted to have Klebsiella oxytoca on respiratory cultures, unclear if colonizer.  -appreciate ID assistance -continue meropenem through tonight  Cirrhosis with ascites [MELD-Na 27, Child Pugh Class C] Cirrhosis present on admission. Received paracentesis 12/7, negative cultures. Repeat ammonia level <10. Independently a poor prognostic indicator for overall outcome. -lactulose, rifaximin  GTC Seizures (11/26, 12/5) Has had 2 GTC seizures in-hospital. No seizure activity captured on LTM x2.  -keppra  1g BID  Acute hypoxic respiratory failure  She has been tolerating weaning efforts over the past 3 days. Plan for terminal extubation tomorrow at 12PM with subsequent comfort focused care. -terminal extubation tomorrow at 12PM -subsequent comfort focused care  Acute Renal Failure (Baseline Cr 0.6- 0.9) - improved Severe Uremia - improved Metabolic acidosis - improved Anasarca Kidney function has  improved and remained stable over the past few days, albeit with CRRT support for metabolic clearance. Continues to have anasarca. No improvement in neurological status despite CRRT. Plan to stop CRRT today. -appreciate nephrology assistance -stop CRRT  Hyperglycemia, controlled -resistant SSI -decreased TF coverage to 4u q6h to reduce risk of hypoglycemia -levemir 10u BID -goal BG 140-180  Severe physical deconditioning Protein caloric malnutrition, mild She has remained bedbound in the ICU for over 1 month now, has profound neuromuscular weakness. Plan for liberalization from ventilator tomorrow at 12PM. -TF at goal  Prolonged QTc interval  Hx of SVT - monitor - avoid QTc prolonging medications  Anemia - chronic (baseline hgb 8-9 in oct 2023) -trend hgb curve -transfuse if hgb <7 and/or hemodynamic instability   Best Practice (right click and "Reselect all SmartList Selections" daily)   Diet/type: tubefeeds  DVT prophylaxis: prophylactic heparin  GI prophylaxis: PPI Lines: Central line - HD cath R fem 06/26/22 Foley:  removed 06/26/22 Code Status:  full code Last date of multidisciplinary goals of care discussion -- family meeting held 07/03/2022, continue full scope of care through tonight with plan for liberalization from ventilator at 12PM July 22, 2022 and subsequent comfort focused care approach.  ATTESTATION & SIGNATURE   Virl Axe, MD [facility], PGY-3 07/03/2022, 2:36 PM   LABS   CBC Recent Labs  Lab 07/01/22 0242 07/02/22 0301 07/03/22 0330  HGB 7.5* 7.9* 8.7*  HCT 23.7* 24.8* 26.4*  WBC 28.3* 23.6* 24.1*  PLT 172 183 201    CHEMISTRY Recent Labs  Lab 06/27/22 0423 06/28/22 0810 06/30/22 0125 06/30/22 1644 07/01/22 0242 07/01/22 1606 07/02/22 0301 07/02/22 1549 07/03/22 0330  NA 134*   < > 138   < > 137 134* 135 136 137  K 3.7   < > 4.1   < > 4.6 4.4 4.6 4.6 5.0  CL 95*   < > 101   < > 101 99 101 101 99  CO2 20*   < > 22   < > 24  24 24 26 25   GLUCOSE 211*   < > 120*   < > 119* 140* 134* 159* 74  BUN 116*   < > 72*   < > 32* 25* 21* 20 19  CREATININE 2.34*   < > 1.49*   < > 0.80 0.63 0.58 0.57 0.56  CALCIUM 9.1   < > 8.2*   < > 8.4* 8.0* 8.3* 8.6* 9.1  MG 2.2  --  2.4  --  2.5*  --  2.5*  --  2.7*  PHOS 6.6*   < > 5.0*   < > 3.2 2.9 2.8 2.9 2.9   < > = values in this interval not displayed.   Estimated Creatinine Clearance: 71 mL/min (by C-G formula based on SCr of 0.56 mg/dL).

## 2022-07-03 NOTE — Progress Notes (Addendum)
Inpatient Diabetes Program Recommendations  AACE/ADA: New Consensus Statement on Inpatient Glycemic Control (2015)  Target Ranges:  Prepandial:   less than 140 mg/dL      Peak postprandial:   less than 180 mg/dL (1-2 hours)      Critically ill patients:  140 - 180 mg/dL   Lab Results  Component Value Date   GLUCAP 137 (H) 07/03/2022   HGBA1C <4.2 (L) 04/25/2022    Review of Glycemic Control  Latest Reference Range & Units 07/02/22 19:38 07/02/22 23:34 07/03/22 03:30 07/03/22 03:32 07/03/22 04:05 07/03/22 07:55  Glucose-Capillary 70 - 99 mg/dL 142 (H) 121 (H) 29 (LL) 57 (L) 165 (H) 137 (H)   Current orders for Inpatient glycemic control:  Novolog resistant q 4 hours, Novolog 8 units q 4 hours, Levemir 10 units bid Vital 55 ml/hr Inpatient Diabetes Program Recommendations:    Note low blood sugar overnight when tube feeds held.   Consider reducing Novolog correction to sensitive q 4 hours and consider adding Dextrose IV if tube feeds are held.   Thanks,  Adah Perl, RN, BC-ADM Inpatient Diabetes Coordinator Pager [phone]  (8a-5p)

## 2022-07-03 NOTE — Progress Notes (Signed)
0536 CRRT filter pressure was increasing steadily. Appeared it would clot off soon. Nephrology paged due to plan to discontinue CRRT this morning on dayshift. Carolin Sicks, MD instructed RN to discontinue CRRT treatment at time of filter clot. Blood returned and CRRT stopped per MD order at 0600.

## 2022-07-03 NOTE — Progress Notes (Signed)
Patient placed back on full support due to increased work of breathing.

## 2022-07-03 NOTE — Progress Notes (Signed)
Pharmacy Antibiotic Note  Deanna Mcmillan is a 51 y.o. female admitted on 05/28/2022 with Listeria meningitis and has been on ampicillin monotherapy. Pharmacy consulted for Merrem dosing in setting of Klebsiella oxytoca pneumonia. After starting therapy, patient is afebrile and wbc is trending down.   CRRT stopped this AM. No plans for further HD. No UOP.   Plan: Reduce Merrem to 1gm IV every 24 hours now off CRRT.  Follow-up LOT (d6) Monitor plans, seizure activity, clinical progress  Height: 5\' 4"  (162.6 cm) Weight: 62.9 kg (138 lb 10.7 oz) IBW/kg (Calculated) : 54.7  Temp (24hrs), Avg:97.6 F (36.4 C), Min:95.7 F (35.4 C), Max:98.9 F (37.2 C)  Recent Labs  Lab 06/28/22 1134 06/28/22 1434 06/29/22 0532 06/29/22 1631 06/30/22 0125 06/30/22 1644 07/01/22 0242 07/01/22 1606 07/02/22 0301 07/02/22 1549 07/03/22 0330  WBC  --   --  42.0*  --  31.9*  --  28.3*  --  23.6*  --  24.1*  CREATININE  --   --  3.11*   < > 1.49*   < > 0.80 0.63 0.58 0.57 0.56  LATICACIDVEN 2.1* 1.7  --   --   --   --   --   --   --   --   --    < > = values in this interval not displayed.    Estimated Creatinine Clearance: 71 mL/min (by C-G formula based on SCr of 0.56 mg/dL).    No Known Allergies   Vanc 11/7 >> 11/11, 12/7 >>12/11 CFP 11/7 >> 11/8  Flagyl 11/7 >> 11/9  CTX 11/8 >>11/11 Ampicillin 11/8 >>11/27 Gent 11/8 >> 11/24 Ancef 11/11 >>11/22 Linezolid 11/24 >> 11/27 Cefazolin 11/29 >>11/27 Meropenem 11/27>12/4, 12/7 >>  Ampicillin 12/4 >> 12/7   11/7 BCx - Listeria, Staph aureus, staph epi via BCID  11/7 UCx > E.coli pan sens 11/8 para cx - negative  11/8 BCx - negative 11/8 TA - MSSA 11/10 listeria in blood and CSF 11/24 bcx: neg 11.25 TA: rare kleb oxy (ESBL) 11/27 csf - neg 11/29 CSF - neg   11/10 GP/GT 6.9/4.3 >> decr to 80mg  q12 11/13-11/14: GP/GT 5.2/1.6 >> decr to 40mg  q12 11/17 GP/GT 6.3/1.1 >> decr  to 20mg  q12 11/20-11/21 GP/GT 1.4/<0.5 >> inc to 60 mg  q24h  Sloan Leiter, PharmD, BCPS, BCCCP Clinical Pharmacist Please refer to [facility] for [place] numbers 07/03/2022, 10:50 AM

## 2022-07-04 DIAGNOSIS — J9601 Acute respiratory failure with hypoxia: Secondary | ICD-10-CM | POA: Diagnosis not present

## 2022-07-04 DIAGNOSIS — A419 Sepsis, unspecified organism: Secondary | ICD-10-CM | POA: Diagnosis not present

## 2022-07-04 DIAGNOSIS — R0609 Other forms of dyspnea: Secondary | ICD-10-CM

## 2022-07-04 DIAGNOSIS — N179 Acute kidney failure, unspecified: Secondary | ICD-10-CM | POA: Diagnosis not present

## 2022-07-04 DIAGNOSIS — G9340 Encephalopathy, unspecified: Secondary | ICD-10-CM | POA: Diagnosis not present

## 2022-07-04 LAB — GLUCOSE, CAPILLARY
Glucose-Capillary: 119 mg/dL — ABNORMAL HIGH (ref 70–99)
Glucose-Capillary: 128 mg/dL — ABNORMAL HIGH (ref 70–99)
Glucose-Capillary: 120 mg/dL — ABNORMAL HIGH (ref 70–99)

## 2022-07-04 MED ORDER — ACETAMINOPHEN 650 MG RE SUPP: 650.0000 mg | Freq: Four times a day (QID) | RECTAL | Status: DC | PRN

## 2022-07-04 MED ORDER — GLYCOPYRROLATE 0.2 MG/ML IJ SOLN
0.2000 mg | INTRAMUSCULAR | Status: DC | PRN
  Administered 2022-07-04 (×2): 0.2 mg via INTRAVENOUS
  Filled 2022-07-04 (×2): qty 1

## 2022-07-04 MED ORDER — POLYVINYL ALCOHOL 1.4 % OP SOLN: 1.0000 [drp] | Freq: Four times a day (QID) | OPHTHALMIC | Status: DC | PRN

## 2022-07-04 MED ORDER — SODIUM CHLORIDE 0.9 % IV SOLN: INTRAVENOUS | Status: DC

## 2022-07-04 MED ORDER — FENTANYL CITRATE PF 50 MCG/ML IJ SOSY
50.0000 ug | PREFILLED_SYRINGE | INTRAMUSCULAR | Status: DC | PRN
  Administered 2022-07-04: 150 ug via INTRAVENOUS
  Administered 2022-07-04 (×3): 100 ug via INTRAVENOUS
  Administered 2022-07-04: 150 ug via INTRAVENOUS
  Filled 2022-07-04 (×2): qty 2
  Filled 2022-07-04: qty 1
  Filled 2022-07-04 (×2): qty 2
  Filled 2022-07-04: qty 3

## 2022-07-04 MED ORDER — GLYCOPYRROLATE 0.2 MG/ML IJ SOLN: 0.2000 mg | INTRAMUSCULAR | Status: DC | PRN

## 2022-07-04 MED ORDER — GLYCOPYRROLATE 1 MG PO TABS: 1.0000 mg | ORAL_TABLET | ORAL | Status: DC | PRN

## 2022-07-04 MED ORDER — ACETAMINOPHEN 325 MG PO TABS: 650.0000 mg | ORAL_TABLET | Freq: Four times a day (QID) | ORAL | Status: DC | PRN

## 2022-07-04 MED ORDER — MIDAZOLAM HCL 2 MG/2ML IJ SOLN
2.0000 mg | INTRAMUSCULAR | Status: DC | PRN
  Administered 2022-07-04: 4 mg via INTRAVENOUS
  Filled 2022-07-04 (×2): qty 2

## 2022-07-04 MED ORDER — MORPHINE 100MG IN NS 100ML (1MG/ML) PREMIX INFUSION
5.0000 mg/h | INTRAVENOUS | Status: DC
  Administered 2022-07-04: 15 mg/h via INTRAVENOUS
  Administered 2022-07-04: 5 mg/h via INTRAVENOUS
  Filled 2022-07-04 (×2): qty 100

## 2022-07-05 LAB — MISC LABCORP TEST (SEND OUT): Labcorp test code: 9985

## 2022-07-17 LAB — MISC LABCORP TEST (SEND OUT)
Labcorp test code: 9985
Labcorp test code: 9985

## 2022-07-20 LAB — FUNGUS CULTURE WITH STAIN

## 2022-07-20 LAB — FUNGUS CULTURE RESULT

## 2022-07-20 LAB — FUNGAL ORGANISM REFLEX

## 2022-07-23 NOTE — Progress Notes (Signed)
NAME:  Deanna Mcmillan, MRN:  [id number], DOB:  01-28-1971, LOS: 26 ADMISSION DATE:  06/03/2022, CONSULTATION DATE:  11/7 REFERRING MD:  Lendon Colonel, EDP CHIEF COMPLAINT:  AMS   BRIEF  52 yo woman with hx of recent diagnosis of alcoholic hepatitis (dx 85/27), here with ams, fever.  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 Seizure-EEG neg Started on pulse steroids per neuro for autoimmune cause    Recently seen at [facility] 10/19: LE Edema. Lasix not helpful at home.  Admitted for diuresis.  Jaundice and abd distension also noted.  Pleural effusion.  GI saw: non enough ascites for tap  Started lasix and spironolactone.  28 day course of prednisolone, miralax, lactulose, miralax. Midodrine TID     Pertinent  Medical History  ETOH Cirrhosis BLE edema recently worsening  S/p lap appy 2014    Echo: recent normal 78/2423   Home meds: folic ascid, acetaminohpen, lasix 40bid, lactulose 30 TID protonix, prednisone (04/29/22), prednisolone 04/28/22 three week course, thiamine, simethicone,    Per her mother she had been drinking at least one bottle of wine daily  Significant Hospital Events: Including procedures, antibiotic start and stop dates in addition to other pertinent events   11/7: Intubated in ED and admitted to ICU 11/7: CTH neg, CT A/P cirrhosis with portal hypertension, splenomegaly, moderate volume ascites, moderate bilateral pleural effusion Blood culture - Listeria and MSSA and MSSE (per mom health dept thinks she got it at PF chang but patient does love cheese) Urine culture - Pan sensitive E colii 11/8 cutlures Trach - MSSA Blood - neg 11/9 KUB: No ileus 11/10: LP confirms listeria meningitis 11/11 KUB: ileus 11/12 ileus resolved with aggressive bowel regimen 11/15 KUB: No ileus. 11/18 more  awake, following commands 1121 RUE PICC 11/22 extubated to BiPAP 11/23 - 14d of Rx complerted for MSSA/MESSE. For listeria - ID wants to extend amp/gent to 3 weeks 11/8-11/29. Ileus post extubation and NG to LIS . [place] increased. BP sofft an dlevophed and alb Rx.   11/24 -  OG placed yesterday for ileus and bile returns. TF on hold. Lactulose increased yesterday. REamins off vent. Did not need BiPAP. This AM more obtunded but mom thinks was awake at night and is sleeping due to fatigue. Afebrile.  But wBC up 13L. Neg baklance -3L:  OFF PRESSORs = Ammonia up at 72 - [place] mild periphepatic ascites Blood culture  11/25 - Now intubated due to obtunded encephaopathy. CTAbc with SBO v Ileus with transition ponit . CCS consult - no surgery but ok for rectal lactulose w ith continue NG/OG tube to LIS. ID ordered repeat blood culture. Linezolid added.  No ntrition since 06/14/22 Pm. Creat worsening. LEss Ur OP. On levophed 61mcg. Not on sedation. Vent 40% Ammonia 108 Trach aspirate -Klebsiella oxytoca 11/27 - Remains intubated. On propofol 87mcg and levophed 42mcg. Febrile up to 101.84F overnight. On ampicillin and linezolid, going for IR guided LP today. Kidney function improving, repleting potassium. No seizure activity noted overnight on EEG. Surgery following for ileus, no need for surgical intervention. Holding TF for today.  11/29 - started on pulse dose steroids, getting autoimmune workup, CSF studies 12/3 - finished steroids, workup negative thus far 12/7 -  Paracentesis -rare WBC no organisms seen Blood culture  Tracheal aspirate: -Few Klebsiella oxytoca and few Candida LUSITANIAE 12/8 -  CRRT started.  Remains encephalopathic. Febrile to 101.26F overnight, worsening leukocytosis. Paracentesis performed yesterday, without any notable findings thus far. On empiric vancomycin and merrem.  Starting CRRT today per nephrology.  12/9 -on the ventilator FiO2 40%.  Getting tube feeds.  On CRRT.  Not on  pressors.  afebrile since 06/28/2022.  White count reduced to 32 K.  Neurology signed off.  Discontinued LTM.  Keppra has been increased to 1000 mg twice daily during the duration of CRRT.  Anasarca has improved.  Mother at the bedside. EEG off.  12/13 - terminal extubation with comfort focused care. Interim History / Subjective:   Exam unchanged from yesterday. She was liberalized from ventilator this afternoon with family at bedside. Comfort focused care orders in place.      Blood pressure 122/61, pulse (!) 123, temperature 100.2 F (37.9 C), resp. rate (!) 44, height 5\' 4"  (1.626 m), weight 61.9 kg, SpO2 98 %.    Vent Mode: PRVC FiO2 (%):  [40 %] 40 % Set Rate:  [20 bmp] 20 bmp Vt Set:  [430 mL] 430 mL PEEP:  [5 cmH20] 5 cmH20 Plateau Pressure:  [16 cmH20-17 cmH20] 16 cmH20   Intake/Output Summary (Last 24 hours) at 07-19-2022 1429 Last data filed at 07-19-22 1200 Gross per 24 hour  Intake 1655.4 ml  Output 510 ml  Net 1145.4 ml   Filed Weights   07/02/22 0128 07/03/22 0500 07-19-2022 0500  Weight: 64.9 kg 62.9 kg 61.9 kg   General: terminally ill middle aged female, laying in bed on RA, NAD. HENT: [place]/AT. Eyes: jaundiced. CV: tachycardic rate and regular rhythm. Pulm: CTABL, on RA, no respiratory distress noted. Abdomen: soft, mildly distended, normoactive bowel sounds. Extremities: anasarca. Neuro: Unchanged neurological exam with no purposeful movement and no response to noxious stimuli of extremities. Areflexic.   Resolved Hospital Problem list   Hypokalemia Hypophosphatemia Hypernatremia Thrombocytopenia Ileus MSSA bacteremia Listeria bacteremia Klebsiella HAP Circulatory Shock   Assessment & Plan:   Comfort Care - 24/58/0998 Metabolic Encephalopathy Listeria Meningoencephalitis MDR Klebsiella HAP Cirrhosis with ascites (MELD-Na 27, Childs Pugh Class C) GTC seizures (11/26, 12/5) Acute hypoxic respiratory failure Acute renal failure with uremia and  anasarca Protein calorie malnutrition Physical Deconditioning Patient with prolonged hospital course and prolonged ICU stay. Continues to have persistent metabolic encephalopathy despite being off sedation for over a week now, likely multifactorial from cirrhosis with ascites, GTC seizures, uremia, and listeria meningoencephalitis. She did not have any clinical improvement despite improvement in ammonia levels, improvement in uremia (with CRRT -- stopped 12/12), and no further seizure activity since 12/5. Family meeting held 12/12 with decision for liberalization from ventilator today with subsequent comfort focused care approach. She was extubated today at around 1:50 PM. Comfort care orders in place. Per [employer], patient is not a candidate for organ donation. Per pathology and medical examiner, patient is not believed to be an ME case, but family is able to pursue a private autopsy if they wish. -extubated 12/13 -comfort care orders in place to focus on dignity and comfort near the end of life -DNR/DNI -avoid any unnecessary/painful interventions (lab draws, etc) -unrestricted visitation status   Best Practice (right click and "Reselect all SmartList Selections" daily)   Diet/type: None DVT prophylaxis: None GI prophylaxis: N/A Lines: Central line - HD cath R fem 06/26/22 Foley:  removed 06/26/22 Code Status:  DNR - comfort Last date of multidisciplinary goals of care discussion -- updated family at bedside 07-19-22  [place]  Virl Axe, MD [facility], PGY-3 2022/07/20, 2:29 PM

## 2022-07-23 NOTE — Progress Notes (Signed)
Patient's family inquiring about autopsy once patient has passed. For this reason, the morgue, pathology, and medical examiner personnel were contacted to see if lines & tubes should be removed from patient. According to the morgue and pathology, there is no reason to leave any lines/tubes (including ETT) in during extubation. Deanna Mcmillan (medical examiner) believed this patient will not be an ME case and therefore, instructed RN to have RT removed ETT during extubation.

## 2022-07-23 NOTE — Progress Notes (Addendum)
Patient asystole on monitor, no breath sounds, no heart sounds auscultated by this RN and Waymon Budge, RN. Pupils fixed, dilated. Oletta Darter, MD notified.  Time of death Nov 02, 2314.

## 2022-07-23 NOTE — Progress Notes (Signed)
Pt extubated to RA  per comfort care orders. 

## 2022-07-23 NOTE — Death Summary Note (Signed)
DEATH SUMMARY   Patient Details  Name: Deanna Mcmillan MRN: [id number] DOB: 08/04/1970  Admission/Discharge Information   Admit Date:  06-16-2022  Date of Death: Date of Death: 07-22-2022  Time of Death: Time of Death: 2314/10/25  Length of Stay: 2035-10-25  Referring Physician: Elisabeth Cara, PA-C   Reason(s) for Hospitalization  Acute hypoxic respiratory failure Septic shock Bilateral multifocal pneumonia due to MDR Klebsiella Alcoholic liver cirrhosis with ascites status post paracentesis Listeria meningitis/encephalitis Listeria bacteremia MSSA bacteremia Acute metabolic/septic encephalopathy Acute kidney injury due to septic ATN required CRRT Anemia of chronic disease New diagnosis of seizures Prolonged Qtc Sacral decubitus ulcer, unstageable, POA  Diagnoses  Preliminary cause of death: Withdrawal of care per family request, in the setting of multisystem organ failure Secondary Diagnoses (including complications and co-morbidities):  Principal Problem:   Acute respiratory failure with hypoxia ([place]) Active Problems:   Pressure injury of skin   Toxic metabolic encephalopathy   On mechanically assisted ventilation (HCC)   Sepsis with acute hypoxic respiratory failure (HCC)   Listeriosis   Acute pulmonary edema (HCC)   Septic shock (HCC)   Compromised airway   MSSA bacteremia   Small bowel obstruction (HCC)   Acute encephalopathy   AKI (acute kidney injury) [facility])   Brief Hospital Course (including significant findings, care, treatment, and services provided and events leading to death)  Deanna Mcmillan is a 52 y.o. year old female with history of alcoholic liver cirrhosis with ascites was brought into the emergency department with altered mental status and fever Patient was intubated and was admitted to ICU LP was done which grew Listeria monocytosis, initially patient was on broad-spectrum antibiotics, ID was consulted, antibiotics were switched to ampicillin, patient  completed 6 weeks course of ampicillin.  Also patient did give Listeria monocytogenes in the blood as well.  Patient went to septic shock, grew MSSA bacteremia requiring multiple vasopressor support, that led to ischemic ATN and renal failure requiring CRRT, nephrology was following, patient did not record her renal functions.  She remained intubated and mechanically ventilated, her respiratory culture grew MDR Klebsiella she completed antibiotic therapy per ID recommendations She develops massive ascites paracentesis was done, which ruled out SBP She remained encephalopathic due to metabolic and septic encephalopathy, which did not improve despite prolonged hospitalization.  She remained off sedation for more than a week without improvement in mental status, MRI was done which did not show signs of anoxic brain injury.  Patient was tolerating supportive breathing trial but unfortunately her mental status was not improving, goals of care discussions were carried with family considering she was in multiorgan failure including alcoholic liver cirrhosis, acute renal failure, severe encephalopathy, patient's family decided to keep her DNR/DNI and proceed with comfort care.  Patient was palliatively extubated on 2022/07/22, she passed at the 11:16 PM, patient's mother was at bedside     Pertinent Labs and Studies  Significant Diagnostic Studies DG Abd 1 View  Result Date: 06/28/2022 CLINICAL DATA:  Hypoxic respiratory failure EXAM: ABDOMEN - 1 VIEW COMPARISON:  Portable exam 1155 hours compared to 06/26/2022 FINDINGS: Tip of feeding tube projects over gastric antrum. Probe tip projects over GE junction region. RIGHT femoral line tip projects over RIGHT pelvis. Enlargement of cardiac silhouette. Persistent atelectasis versus consolidation LEFT lower lobe. Nonobstructive bowel gas pattern. Small amount of contrast opacification within the stomach. No acute osseous findings. IMPRESSION: Persistent atelectasis  versus consolidation LEFT lower lobe. Electronically Signed   By: Crist Infante.D.  On: 06/28/2022 14:34   DG CHEST PORT 1 VIEW  Result Date: 06/28/2022 CLINICAL DATA:  Hypoxic respiratory failure EXAM: PORTABLE CHEST 1 VIEW COMPARISON:  Portable exam 1154 hours compared to 06/27/2022 FINDINGS: Tip of endotracheal tube projects 3.1 cm above carina. Feeding tube extends into stomach. Probe tip at GE junction. RIGHT arm PICC line tip projects over RIGHT atrium. Enlargement of cardiac silhouette. Atelectasis versus consolidation of LEFT lower lobe persists. Overall low lung volumes. No gross pleural effusion or pneumothorax. IMPRESSION: Persistent atelectasis versus consolidation LEFT lower lobe. Electronically Signed   By: Lavonia Dana M.D.   On: 06/28/2022 14:33   Overnight EEG with video  Result Date: 06/27/2022 Samuella Cota, MD     06/27/2022  8:38 AM EEG Procedure CPT/Type of Study: 40981; 2-12hr EEG with video Referring Provider: Agarwala Primary Neurological Diagnosis: seizure History: This is a 52 yr old patient, undergoing an EEG to evaluate for seizures. Clinical State: disoriented Technical Description: The EEG was performed using standard setting per the guidelines of American Clinical Neurophysiology Society (ACNS). A minimum of 21 electrodes were placed on scalp according to the International 10-20 or/and 10-10 Systems. Supplemental electrodes were placed as needed. Single EKG electrode was also used to detect cardiac arrhythmia. Patient's behavior was continuously recorded on video simultaneously with EEG. A minimum of 16 channels were used for data display. Each epoch of study was reviewed manually daily and as needed using standard referential and bipolar montages. Computerized quantitative EEG analysis (such as compressed spectral array analysis, trending, automated spike & seizure detection) were used as indicated. Day 1: from 2300 06/26/22 to 0730 06/27/22 EEG Description: Overall Amplitude:  Low/suppressed Predominant Frequency: The background activity showed low voltage 1-3Hz  activity rarely and frequent periods of voltage attenuation. Superimposed Frequencies: none The background was symmetric Background Abnormalities: Generalized slowing: diffuse low voltage slowing and attenuation as above Rhythmic or periodic pattern: No Epileptiform activity: no Electrographic seizures: no Events: no Breach rhythm: no Reactivity: Absent Stimulation procedures: Hyperventilation: not done Photic stimulation: no change Sleep Background: none EKG: irregular rhythm Impression: This was an abnormal continuous video EEG due to diffuse slowing with attenuated background, indicative of a severe sedated encephalopathy pattern. No seizures or epileptiform discharges were seen.   DG CHEST PORT 1 VIEW  Result Date: 06/27/2022 CLINICAL DATA:  Dyspnea, sepsis, history of alcoholic liver disease EXAM: PORTABLE CHEST 1 VIEW COMPARISON:  Portable exam 0515 hours compared to 06/19/2022 FINDINGS: Tip of endotracheal tube projects 4.9 cm above carina. Feeding tube extends into stomach. RIGHT arm PICC line tip projects over cavoatrial junction. Enlargement of cardiac silhouette. Probable layered LEFT pleural effusion with bibasilar atelectasis versus consolidation greater on LEFT. No pneumothorax or acute osseous findings. IMPRESSION: Bibasilar atelectasis versus infiltrate greater on LEFT with probable LEFT pleural effusion. Electronically Signed   By: Lavonia Dana M.D.   On: 06/27/2022 08:29   US Abdomen Limited RUQ (LIVER/GB)  Result Date: 06/26/2022 CLINICAL DATA:  Alcoholic hepatitis, cirrhosis, ascites, meningitis, sepsis and respiratory failure. EXAM: ULTRASOUND ABDOMEN LIMITED RIGHT UPPER QUADRANT COMPARISON:  Abdominal ultrasound on 06/14/2022 FINDINGS: Gallbladder: No gallbladder wall thickening or gallstones. Less prominent biliary sludge in the gallbladder lumen. Common bile duct: Diameter: 3 mm Liver: Evidence  cirrhotic liver disease with nodular surface contour. No focal masses or evidence of intrahepatic biliary ductal dilatation. Portal vein is patent on color Doppler imaging with normal direction of blood flow towards the liver. Other: Recanalized umbilical vein present likely reflective of underlying portal hypertension. Ascites  is seen around the liver. IMPRESSION: Evidence of cirrhosis and portal hypertension with recanalized umbilical vein and ascites. Electronically Signed   By: Aletta Edouard M.D.   On: 06/26/2022 17:05   DG Abd 1 View  Result Date: 06/26/2022 CLINICAL DATA:  Ileus. EXAM: ABDOMEN - 1 VIEW COMPARISON:  06/24/2022. FINDINGS: Examination is limited due to patient's body habitus and technique. The bowel gas pattern is normal. An enteric tube terminates in the distal stomach. No radio-opaque calculi or other significant radiographic abnormality are seen. IMPRESSION: Nonobstructive bowel-gas pattern. Electronically Signed   By: Brett Fairy M.D.   On: 06/26/2022 02:35   DG Abd 1 View  Result Date: 06/24/2022 CLINICAL DATA:  Abdominal distension. EXAM: ABDOMEN - 1 VIEW COMPARISON:  06/19/2022 FINDINGS: A small bore feeding tube is noted with tip overlying the distal stomach. Decreased distension of small bowel noted since the prior study. Gas in nondistended small bowel and colon identified. No suspicious calcifications are present. IMPRESSION: Decreased small bowel distension since 06/19/2022. Nondistended gas-filled small bowel loops and colon identified. Electronically Signed   By: Margarette Canada M.D.   On: 06/24/2022 12:35   CT CHEST WO CONTRAST  Result Date: 06/21/2022 CLINICAL DATA:  Concern for malignancy EXAM: CT CHEST WITHOUT CONTRAST TECHNIQUE: Multidetector CT imaging of the chest was performed following the standard protocol without IV contrast. RADIATION DOSE REDUCTION: This exam was performed according to the departmental dose-optimization program which includes automated  exposure control, adjustment of the mA and/or kV according to patient size and/or use of iterative reconstruction technique. COMPARISON:  Abdomen and pelvis CT dated June 15, 2022 FINDINGS: Cardiovascular: Normal heart size. Pericardial effusion. Caliber thoracic aorta with no atherosclerotic disease. Right central venous line tip in the right atrium. Small hiatal hernia. Mediastinum/Nodes: No pathologically enlarged lymph nodes seen in the chest. Small hiatal hernia. Thyroid is unremarkable. Lungs/Pleura: Bronchus intermedius is occluded with complete of the right middle and right lower lobe. Complete collapse of the left lower lobe and small left pleural effusion. Patchy upper lobe predominant ground-glass opacities and scattered nodular opacities. Part solid nodule of the right upper lobe measuring 6 mm with 3 mm solid component and part solid nodule of the right lower lobe measuring up to 9 mm with 7 mm solid component on series 4, image 75. Upper Abdomen: Cirrhotic liver morphology, upper abdominal varices and small volume ascites. Musculoskeletal: No chest wall mass or suspicious bone lesions identified. IMPRESSION: 1. Patchy upper lobe predominant ground-glass opacities and scattered nodular opacities, findings are likely infectious or inflammatory, a component of pulmonary edema is also possible. Non-contrast chest CT at 3-6 months is recommended. If nodules persist, subsequent management will be based upon the most suspicious nodule(s). This recommendation follows the consensus statement: Guidelines for Management of Incidental Pulmonary Nodules Detected on CT Images: From the Fleischner Society 2017; Radiology 2017; 284:228-243. 2. Bronchus intermedius is occluded with complete of the right middle and right lower lobe, likely due to mucous plugging. 3. Small left pleural effusion with complete collapse of the left lower lobe. 4. Partially visualized upper abdomen with cirrhotic liver morphology, upper  abdominal varices and small volume ascites. Electronically Signed   By: Yetta Glassman M.D.   On: 06/21/2022 08:37   DG FL GUIDED LUMBAR PUNCTURE  Result Date: 06/20/2022 CLINICAL DATA:  Patient with listeria meningitis, encephalopathy, 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 Previous fluoroscopically guided lumbar puncture 06/01/22, 06/18/22. Request for repeat lumbar puncture to further evaluate. EXAM: LUMBAR PUNCTURE UNDER FLUOROSCOPY PROCEDURE:  An appropriate skin entry site was determined fluoroscopically. Operator donned sterile gloves and mask. Skin site was marked, then prepped with Betadine, draped in usual sterile fashion, and infiltrated locally with 1% lidocaine. A 20 gauge spinal needle advanced into the thecal sac at L3-4 level. Hazy yellow CSF spontaneously returned, with opening pressure of 23 cm water. 20 ml CSF were collected and divided among 4 sterile vials for the requested laboratory studies. Closing pressure 12 cm water The needle was then removed. The patient tolerated the procedure well and there were no complications. FLUOROSCOPY: Radiation Exposure Index (as provided by the fluoroscopic device): 8.10 mGy Kerma IMPRESSION: Technically successful lumbar puncture under fluoroscopy. This exam was performed by Candiss Norse, PA-C, and was supervised and interpreted by Nelson Chimes, MD. Electronically Signed   By: Nelson Chimes M.D.   On: 06/20/2022 14:15   DG CHEST PORT 1 VIEW  Result Date: 06/19/2022 CLINICAL DATA:  Hypoxia EXAM: PORTABLE CHEST 1 VIEW COMPARISON:  06/15/2022 FINDINGS: Endotracheal tube seen 3.7 cm above the carina, withdrawn since prior examination. Nasogastric tube and nasoenteric feeding tube extending into the upper abdomen beyond the margin of the examination. Right upper extremity PICC line tip noted within the superior right atrium. Lung volumes are small, but are stable since prior examination. Partial right lower lobe collapse noted. Left  perihilar pulmonary infiltrate appears stable, asymmetric pulmonary edema versus infection. No pneumothorax or pleural effusion. Cardiac size within normal limits. IMPRESSION: 1. Endotracheal tube slightly withdrawn since prior examination, now 3.7 cm above the carina. Otherwise stable support lines and tubes. 2. Stable pulmonary hypoinflation with right lower lobe collapse. 3. Stable left perihilar pulmonary infiltrate, edema versus infection. Electronically Signed   By: Fidela Salisbury M.D.   On: 06/19/2022 15:23   MR BRAIN W WO CONTRAST  Result Date: 06/19/2022 CLINICAL DATA:  Altered mental status. Encephalopathy. Lumbar puncture of 06/18/2022. Rule out CSF leak/spontaneous intracranial hypotension EXAM: MRI HEAD WITHOUT AND WITH CONTRAST MRI CERVICAL SPINE WITHOUT AND WITH CONTRAST TECHNIQUE: Multiplanar, multiecho pulse sequences of the brain and surrounding structures, and cervical spine, to include the craniocervical junction and cervicothoracic junction, were obtained without and with intravenous contrast. CONTRAST:  7.86mL GADAVIST GADOBUTROL 1 MMOL/ML IV SOLN COMPARISON:  MRI head 06/17/2022 FINDINGS: MRI HEAD FINDINGS Brain: Negative for acute infarct or mass. No acute hemorrhage or fluid collection. Pituitary mildly enlarged for age measuring 8 mm in height. This is unchanged. Pituitary enhances homogeneously. There is hyperintensity in the sulci of FLAIR imaging which has progressed in the interval. Ventricles remain mildly enlarged and unchanged. Following contrast infusion, there is diffuse pack E meningeal enhancement and mild thickening. The mamillopontine distance is 4.3 mm which is narrowed. These findings can be seen with intracranial hypotension however can be also seen with meningitis. Vascular: Normal arterial flow voids.  Normal venous enhancement Skull and upper cervical spine: No focal skeletal lesion. Sinuses/Orbits: Paranasal sinuses clear. Mild mastoid effusion bilaterally. Negative  orbit Other: None MRI CERVICAL SPINE FINDINGS Alignment: Mild anterolisthesis C2-3. Mild retrolisthesis C5-6. 3 mm retrolisthesis C6-7 Vertebrae: Negative for fracture or mass. No evidence of spinal infection or abscess. Cord: Normal signal and morphology Posterior Fossa, vertebral arteries, paraspinal tissues: The patient is intubated. NG tube in feeding tube in the esophagus. No mass, adenopathy, or fluid collection in the neck. Disc levels: C2-3: Negative C3-4: Disc degeneration with asymmetric spurring on the left. Moderate left foraminal narrowing. C4-5: Disc degeneration with mild uncinate spurring. Mild left foraminal narrowing C5-6: Disc degeneration  with uncinate spurring. Moderate right foraminal narrowing and mild left foraminal narrowing C6-7: Mild disc degeneration and spurring without significant stenosis C7-T1 height negative IMPRESSION: 1. Negative for acute infarct or mass. 2. Diffuse pachymeningeal enhancement and mild thickening. Sulcal hyperintensity on FLAIR has progressed in the interval. Mild pituitary enlargement for age unchanged. The mamillopontine distance is narrowed at 4.3 mm. Findings can be seen with intracranial hypotension however some of these findings can also be seen with meningitis. 3. No evidence of spinal infection in the cervical spine. Cervical spondylosis causing foraminal narrowing as described above. Electronically Signed   By: Franchot Gallo M.D.   On: 06/19/2022 13:04   MR CERVICAL SPINE W WO CONTRAST  Result Date: 06/19/2022 CLINICAL DATA:  Altered mental status. Encephalopathy. Lumbar puncture of 06/18/2022. Rule out CSF leak/spontaneous intracranial hypotension EXAM: MRI HEAD WITHOUT AND WITH CONTRAST MRI CERVICAL SPINE WITHOUT AND WITH CONTRAST TECHNIQUE: Multiplanar, multiecho pulse sequences of the brain and surrounding structures, and cervical spine, to include the craniocervical junction and cervicothoracic junction, were obtained without and with  intravenous contrast. CONTRAST:  7.83mL GADAVIST GADOBUTROL 1 MMOL/ML IV SOLN COMPARISON:  MRI head 06/17/2022 FINDINGS: MRI HEAD FINDINGS Brain: Negative for acute infarct or mass. No acute hemorrhage or fluid collection. Pituitary mildly enlarged for age measuring 8 mm in height. This is unchanged. Pituitary enhances homogeneously. There is hyperintensity in the sulci of FLAIR imaging which has progressed in the interval. Ventricles remain mildly enlarged and unchanged. Following contrast infusion, there is diffuse pack E meningeal enhancement and mild thickening. The mamillopontine distance is 4.3 mm which is narrowed. These findings can be seen with intracranial hypotension however can be also seen with meningitis. Vascular: Normal arterial flow voids.  Normal venous enhancement Skull and upper cervical spine: No focal skeletal lesion. Sinuses/Orbits: Paranasal sinuses clear. Mild mastoid effusion bilaterally. Negative orbit Other: None MRI CERVICAL SPINE FINDINGS Alignment: Mild anterolisthesis C2-3. Mild retrolisthesis C5-6. 3 mm retrolisthesis C6-7 Vertebrae: Negative for fracture or mass. No evidence of spinal infection or abscess. Cord: Normal signal and morphology Posterior Fossa, vertebral arteries, paraspinal tissues: The patient is intubated. NG tube in feeding tube in the esophagus. No mass, adenopathy, or fluid collection in the neck. Disc levels: C2-3: Negative C3-4: Disc degeneration with asymmetric spurring on the left. Moderate left foraminal narrowing. C4-5: Disc degeneration with mild uncinate spurring. Mild left foraminal narrowing C5-6: Disc degeneration with uncinate spurring. Moderate right foraminal narrowing and mild left foraminal narrowing C6-7: Mild disc degeneration and spurring without significant stenosis C7-T1 height negative IMPRESSION: 1. Negative for acute infarct or mass. 2. Diffuse pachymeningeal enhancement and mild thickening. Sulcal hyperintensity on FLAIR has progressed in  the interval. Mild pituitary enlargement for age unchanged. The mamillopontine distance is narrowed at 4.3 mm. Findings can be seen with intracranial hypotension however some of these findings can also be seen with meningitis. 3. No evidence of spinal infection in the cervical spine. Cervical spondylosis causing foraminal narrowing as described above. Electronically Signed   By: Franchot Gallo M.D.   On: 06/19/2022 13:04   DG Abd Portable 1V  Result Date: 06/19/2022 CLINICAL DATA:  Small-bowel obstruction EXAM: PORTABLE ABDOMEN - 1 VIEW COMPARISON:  06/18/2022 FINDINGS: Nasogastric tube tip in the antrum of the stomach. Soft feeding tube in the region of the pylorus or proximal duodenum. Persistent dilated small intestine, largest loop 6 cm. Some previously administered contrast present within the stomach and within the proximal small bowel IMPRESSION: Persistent small bowel obstruction pattern. Nasogastric tube  tip in the antrum of the stomach. Soft feeding tube in the region of the pylorus or proximal duodenum. Electronically Signed   By: Nelson Chimes M.D.   On: 06/19/2022 09:51   DG FL GUIDED LUMBAR PUNCTURE  Result Date: 06/18/2022 CLINICAL DATA:  Listeria meningitis, encephalopathy EXAM: DIAGNOSTIC LUMBAR PUNCTURE UNDER FLUOROSCOPIC GUIDANCE COMPARISON:  06/01/22. FLUOROSCOPY: Radiation Exposure Index (as provided by the fluoroscopic device): 2.40 mGy Kerma PROCEDURE: Informed consent was obtained from the patient's mother prior to the procedure, including potential complications of headache, allergy, and pain. With the patient prone, the lower back was prepped with Betadine. 1% Lidocaine was used for local anesthesia. Lumbar puncture was performed at the L3-L4 level using a 20g gauge needle with return of yellow, clear CSF with an opening pressure of 25 cm water. 15 ml of CSF were obtained for laboratory studies. Closing pressure was measured at 19 cm water. The patient tolerated the procedure well  and there were no apparent complications. IMPRESSION: Successful lumbar puncture as described above. This exam was performed by Pasty Spillers, PA-C, and was supervised and interpreted by Lajean Manes, MD. Electronically Signed   By: Lajean Manes M.D.   On: 06/18/2022 11:15   DG Abd Portable 1V  Result Date: 06/18/2022 CLINICAL DATA:  Small-bowel obstruction EXAM: PORTABLE ABDOMEN - 1 VIEW COMPARISON:  06/17/2022 FINDINGS: Nasogastric tube tip in the antrum of the stomach. Soft feeding tube tip is now in the region of the pylorus, previously in the descending duodenum. Persistent small bowel obstruction pattern, probably slightly worsened radiographically. Small bowel dilatation measures up to 4.6 cm. IMPRESSION: 1. Nasogastric tube tip in the antrum of the stomach. Soft feeding tube tip now in the region of the pylorus, previously in the descending duodenum. 2. Persistent small bowel obstruction pattern, probably slightly worsened. Electronically Signed   By: Nelson Chimes M.D.   On: 06/18/2022 08:02   MR BRAIN WO CONTRAST  Result Date: 06/17/2022 CLINICAL DATA:  Seizure EXAM: MRI HEAD WITHOUT CONTRAST TECHNIQUE: Multiplanar, multiecho pulse sequences of the brain and surrounding structures were obtained without intravenous contrast. COMPARISON:  CT head obtained the same day and CT head 06/08/2022 FINDINGS: Brain: There is no acute intracranial hemorrhage, extra-axial fluid collection, or acute infarct. Areas of sulcal FLAIR hyperintensity may be related to intubation/oxygenation given normal same-day head CT. Parenchymal volume is normal. The ventricles are normal in size. Gray-white differentiation is preserved. There is mild FLAIR signal abnormality in the subcortical and periventricular white matter which is nonspecific but may reflect sequela of mild chronic small-vessel ischemic change. The hippocampi are normal in signal and architecture. There is no mass lesion.  There is no mass effect or  midline shift. Vascular: Normal flow voids. Skull and upper cervical spine: Normal marrow signal. Sinuses/Orbits: The paranasal sinuses are clear. The globes and orbits are unremarkable. Other: There are bilateral mastoid effusions which may be related to instrumentation. IMPRESSION: No acute intracranial pathology or epileptogenic focus identified. Electronically Signed   By: Valetta Mole M.D.   On: 06/17/2022 17:57   DG Abd Portable 1V-Small Bowel Obstruction Protocol-24 hr delay  Result Date: 06/17/2022 CLINICAL DATA:  Nasogastric tube placement. EXAM: PORTABLE ABDOMEN - 1 VIEW COMPARISON:  June 16, 2022. FINDINGS: Distal tip of nasogastric tube is seen in expected position of distal stomach. Distal tip of feeding tube is seen in expected position of proximal duodenum. Continued small bowel dilatation is noted. IMPRESSION: Stable position of nasogastric and feeding tubes. Grossly stable small bowel  dilatation. Electronically Signed   By: Marijo Conception M.D.   On: 06/17/2022 08:33   Overnight EEG with video  Result Date: 06/17/2022 Lora Havens, MD     06/18/2022  9:16 AM Patient Name: Deanna Mcmillan MRN: [id number] Epilepsy Attending: Lora Havens Referring Physician/Provider: Donnetta Simpers, MD Duration: 06/17/2022 4481 to 06/18/2022 8563 Patient history: 52yo F with GTC seizure. EEG to evaluate for seizure. Level of alertness: comatose AEDs during EEG study: LEV, Propofol Technical aspects: This EEG study was done with scalp electrodes positioned according to the 10-20 International system of electrode placement. Electrical activity was reviewed with band pass filter of 1-70Hz , sensitivity of 7 uV/mm, display speed of 5mm/sec with a 60Hz  notched filter applied as appropriate. EEG data were recorded continuously and digitally stored.  Video monitoring was available and reviewed as appropriate. Description: EEG showed burst suppression pattern with burst of 5-7hz  theta slowing  lasting 1 seconds alternating with 2-5 seconds of generalized suppression. Hyperventilation and photic stimulation were not performed.   Of note, study was technically difficult to interpret due to significant electrode artifact. ABNORMALITY - Burst suppression, generalized IMPRESSION: This technically difficult study is suggestive of profound diffuse encephalopathy, nonspecific etiology but likely related to sedation. No seizures or epileptiform discharges were seen throughout the recording. Priyanka Barbra Sarks   CT HEAD WO CONTRAST (5MM)  Result Date: 06/17/2022 CLINICAL DATA:  Seizure, new onset. Meningitis/CNS infection suspected. Mental status change, unknown cause. EXAM: CT HEAD WITHOUT CONTRAST TECHNIQUE: Contiguous axial images were obtained from the base of the skull through the vertex without intravenous contrast. RADIATION DOSE REDUCTION: This exam was performed according to the departmental dose-optimization program which includes automated exposure control, adjustment of the mA and/or kV according to patient size and/or use of iterative reconstruction technique. COMPARISON:  05/25/2022. FINDINGS: Brain: No acute intracranial hemorrhage, midline shift or mass effect. No extra-axial fluid collection. Mild atrophy is noted. Periventricular white matter hypodensities are present bilaterally. No hydrocephalus. Vascular: No hyperdense vessel or unexpected calcification. Skull: Normal. Negative for fracture or focal lesion. Sinuses/Orbits: No acute finding. Other: Tubes are present in the nasopharynx. A few opacities are noted in the mastoid air cells on the right. IMPRESSION: 1. No acute intracranial hemorrhage. 2. Atrophy with chronic microvascular ischemic changes. Electronically Signed   By: Brett Fairy M.D.   On: 06/17/2022 04:55   DG Abd 1 View  Result Date: 06/16/2022 CLINICAL DATA:  Evaluate NG tube placement EXAM: ABDOMEN - 1 VIEW COMPARISON:  June 16, 2022 FINDINGS: The NG tube  terminates in the distal stomach. The feeding tube terminates in the distal second portion of the duodenum. Continued small bowel obstruction with dilated loops of small bowel. IMPRESSION: 1. The NG tube terminates in the distal stomach. The feeding tube terminates in the distal second portion of the duodenum. 2. Continued small bowel obstruction. Electronically Signed   By: Dorise Bullion III M.D.   On: 06/16/2022 12:57   DG Abd Portable 1V-Small Bowel Obstruction Protocol-initial, 8 hr delay  Result Date: 06/16/2022 CLINICAL DATA:  Small bowel obstruction. EXAM: PORTABLE ABDOMEN - 1 VIEW COMPARISON:  KUB June 15, 2022. CT the abdomen and pelvis June 15, 2022. FINDINGS: The feeding tube is been advanced and appears to terminate in the distal second portion of the duodenum. The NG tube terminates in the stomach. Dilated loops of small bowel are identified throughout the abdomen consistent with on going small-bowel obstruction. IMPRESSION: 1. Support apparatus as above. 2. Continued small  bowel obstruction. Electronically Signed   By: Dorise Bullion III M.D.   On: 06/16/2022 11:52   CT ABDOMEN PELVIS WO CONTRAST  Result Date: 06/15/2022 CLINICAL DATA:  Dilated bowel loops on recent plain film EXAM: CT ABDOMEN AND PELVIS WITHOUT CONTRAST TECHNIQUE: Multidetector CT imaging of the abdomen and pelvis was performed following the standard protocol without IV contrast. RADIATION DOSE REDUCTION: This exam was performed according to the departmental dose-optimization program which includes automated exposure control, adjustment of the mA and/or kV according to patient size and/or use of iterative reconstruction technique. COMPARISON:  06/01/2022 FINDINGS: Lower chest: Bilateral lower lobe consolidation is noted with moderate left-sided pleural effusion. This is stable from the prior exam. Hepatobiliary: Small hypodensity is noted within the liver near the dome of the liver stable from the prior exam  likely representing a small cyst. Mild nodularity of the liver is noted consistent with underlying cirrhosis. Gallbladder is within normal limits. Dependent density is noted within the gallbladder likely representing sludge. No definitive gallstones are seen. Mild perihepatic fluid is seen. Pancreas: Unremarkable. No pancreatic ductal dilatation or surrounding inflammatory changes. Spleen: Normal in size without focal abnormality. Adrenals/Urinary Tract: Adrenal glands are within normal limits. Kidneys are unremarkable. No renal calculi or obstructive changes are noted. The bladder is partially distended. Stomach/Bowel: Rectal tube is noted in place. The colon shows mild diverticular change without obstructive process. Air is noted throughout the transverse colon. The appendix is not visualized consistent with a prior surgical history. Stomach is distended with fluid despite gastric catheters. The weighted feeding catheter extends towards the pylorus. Multiple dilated loops of small bowel are noted extending from the duodenum to the mid to distal ileum. Fecalization of small bowel contents is noted. A transition zone is seen in the anterior abdomen best noted on image number 57 of series 3. Changes may be related to adhesions. The distal most small bowel is within normal limits. Vascular/Lymphatic: No significant vascular findings are present. No enlarged abdominal or pelvic lymph nodes. Reproductive: Uterus and bilateral adnexa are unremarkable. Other: Mild free fluid is noted within the abdomen likely related to the underlying cirrhosis although some may be reactive to the small bowel abnormality. No free air is seen. Musculoskeletal: Degenerative changes of lumbar spine are noted. IMPRESSION: Multiple dilated loops of small bowel with a transition zone in the mid to distal ileum consistent with a least partial small bowel obstruction. Bibasilar consolidation with left-sided pleural effusions stable from the prior  CT. Cirrhotic changes of the liver with ascites. Ascites may also be in part due to the underlying obstructive change. Electronically Signed   By: Inez Catalina M.D.   On: 06/15/2022 23:30   DG CHEST PORT 1 VIEW  Result Date: 06/15/2022 CLINICAL DATA:  Endotracheal tube and feeding tube placement. EXAM: PORTABLE CHEST 1 VIEW COMPARISON:  None Available. FINDINGS: Endotracheal tube with distal tip in the right mainstem bronchus, it need to be retracted 4-5 cm. Feeding tube coursing below the diaphragm with distal tip not included. Heart is normal in size. Low lung volumes with bibasilar opacities suggesting atelectasis or infiltrate. IMPRESSION: 1. Endotracheal tube with distal tip in the right mainstem bronchus, it need to be retracted 3-4 cm. 2. Low lung volumes with bibasilar opacities suggesting atelectasis or infiltrate. Electronically Signed   By: Keane Police D.O.   On: 06/15/2022 22:13   DG Abd Portable 1V  Result Date: 06/15/2022 CLINICAL DATA:  Check feeding catheter placement EXAM: PORTABLE ABDOMEN - 1 VIEW  COMPARISON:  None Available. FINDINGS: Gastric catheter is noted within the stomach. Weighted feeding catheter is noted in the distal stomach near the pyloric channel. Multiple dilated loops of small bowel are noted suspicious for small bowel obstruction. No free air is noted. IMPRESSION: Gastric catheters as described. Electronically Signed   By: Inez Catalina M.D.   On: 06/15/2022 22:12   DG Abd 1 View  Result Date: 06/15/2022 CLINICAL DATA:  Ileus. EXAM: ABDOMEN - 1 VIEW COMPARISON:  June 14, 2022. FINDINGS: Distal tip of nasogastric tube is seen in expected position of stomach. Distal tip of feeding tube is seen in expected position of distal stomach. Dilated small bowel loops are noted concerning for ileus or possibly distal small bowel obstruction. IMPRESSION: Stable dilated small bowel loops are noted concerning for ileus or possibly distal small bowel obstruction. Electronically  Signed   By: Marijo Conception M.D.   On: 06/15/2022 10:22   US Abdomen Complete  Result Date: 06/14/2022 CLINICAL DATA:  52 year old female with cirrhosis. EXAM: ABDOMEN ULTRASOUND COMPLETE COMPARISON:  06/21/2022 CT and prior studies FINDINGS: Gallbladder: Gallbladder sludge is noted with mild gallbladder wall thickening. There is no evidence of cholelithiasis or sonographic Murphy sign. Common bile duct: Diameter: Not visualized. No intrahepatic biliary dilatation noted. Liver: Heterogeneous increased hepatic echogenicity noted. No definite focal hepatic lesion noted. Equivocal nodularity of the hepatic contour or identified. Portal vein is patent on color Doppler imaging with normal direction of blood flow towards the liver. IVC: No abnormality visualized. Pancreas: Visualized portion unremarkable. Spleen: UPPER limits normal in size. Right Kidney: Length: 11.3 cm. Echogenicity within normal limits. No mass or hydronephrosis visualized. Left Kidney: Length: 13.6 cm. Echogenicity within normal limits. No mass or hydronephrosis visualized. Abdominal aorta: No aneurysm visualized. Other findings: Trace perihepatic ascites noted. IMPRESSION: 1. Hepatic steatosis with sonographic findings suggestive of cirrhosis. No focal hepatic lesions. UPPER limits normal spleen size. 2. Trace perihepatic ascites. 3. Mild gallbladder wall thickening without cholelithiasis, which is likely related to hepatic dysfunction. If there is very strong clinical suspicion for acute cholecystitis, recommend nuclear medicine study. 4. CBD not well visualized. No evidence of intrahepatic biliary dilatation. Electronically Signed   By: Margarette Canada M.D.   On: 06/14/2022 17:46   DG Abd 1 View  Result Date: 06/14/2022 CLINICAL DATA:  Confirm orogastric tube placement. EXAM: ABDOMEN - 1 VIEW COMPARISON:  Radiograph earlier today FINDINGS: Tip of the weighted enteric tube in the right upper quadrant in the region of the distal stomach or  proximal duodenum. Persisting gaseous distention of bowel loops centrally. Patchy bibasilar opacities in the included lung bases. IMPRESSION: Tip of the weighted enteric tube in the right upper quadrant in the region of the distal stomach or proximal duodenum. Electronically Signed   By: Keith Rake M.D.   On: 06/14/2022 15:23   DG Abd 1 View  Result Date: 06/14/2022 CLINICAL DATA:  Hepatic cirrhosis with ascites. EXAM: ABDOMEN - 1 VIEW COMPARISON:  Abdominal radiograph dated June 06, 2022 FINDINGS: Gaseous distention of the bowel loops in a nonobstructive pattern. Weighted tip feeding tube with distal tip in the pyloric region. No appreciable intraperitoneal free air. Irregularity of the left superior pubic ramus. IMPRESSION: 1.  Weighted tip feeding tube with distal tip in the pyloric region. 2. Gaseous distention of multiple bowel loops in a nonobstructive pattern suggesting ileus. 3. Irregularity of the left pubic ramus about the pubic symphysis, which may be secondary to motion artifact. Follow-up examination is suggested. Electronically  Signed   By: Keane Police D.O.   On: 06/14/2022 12:20   [place] EKG SITE RITE  Result Date: 06/12/2022 If Site Rite image not attached, placement could not be confirmed due to current cardiac rhythm.  DG CHEST PORT 1 VIEW  Result Date: 06/08/2022 CLINICAL DATA:  [id number] Dyspnea [id number] EXAM: PORTABLE CHEST 1 VIEW COMPARISON:  June 05, 2022 FINDINGS: The cardiomediastinal silhouette is unchanged in contour. The enteric tube courses through the chest to the abdomen beyond the field-of-view. RIGHT IJ CVC catheter with tip terminating over the RIGHT atrium. ETT tip terminates approximately 1.8 cm above the carina. Small bilateral pleural effusions. No pneumothorax. Similar appearance of bibasilar heterogeneous opacities. IMPRESSION: 1. Support apparatus as described above. 2. Similar appearance of bibasilar heterogeneous opacities and small bilateral pleural  effusions. Electronically Signed   By: Valentino Saxon M.D.   On: 06/08/2022 09:04   DG Abd Portable 1V  Result Date: 06/06/2022 CLINICAL DATA:  Feeding tube placement. EXAM: PORTABLE ABDOMEN - 1 VIEW COMPARISON:  None Available. FINDINGS: Distal tip of feeding tube is seen in expected position of distal stomach. IMPRESSION: Distal tip of feeding tube seen in expected position of distal stomach. Electronically Signed   By: Marijo Conception M.D.   On: 06/06/2022 15:11   DG Abd 1 View  Result Date: 06/06/2022 CLINICAL DATA:  Nausea and vomiting. Assess nasogastric tube placement. EXAM: ABDOMEN - 1 VIEW COMPARISON:  Radiographs 06/02/2022 and 05/31/2022.  CT 06/04/2022. FINDINGS: At 0801 hours. Enteric tube has been slightly advanced, tip overlying the left mid abdomen, likely in the mid stomach. Previously demonstrated bowel distension has improved. Persistent bibasilar atelectasis and a probable small left pleural effusion. IMPRESSION: Enteric tube tip is in the mid stomach. Electronically Signed   By: Richardean Sale M.D.   On: 06/06/2022 08:13    Microbiology Recent Results (from the past 240 hour(s))  Culture, blood (Routine X 2) w Reflex to ID Panel     Status: None   Collection Time: 06/28/22 12:48 PM   Specimen: BLOOD RIGHT HAND  Result Value Ref Range Status   Specimen Description BLOOD RIGHT HAND  Final   Special Requests   Final    BOTTLES DRAWN AEROBIC AND ANAEROBIC Blood Culture adequate volume   Culture   Final    NO GROWTH 5 DAYS Performed at [facility], [place] [address] [place], [place] [address]    Report Status 07/03/2022 FINAL  Final  Culture, blood (Routine X 2) w Reflex to ID Panel     Status: None   Collection Time: 06/28/22 12:53 PM   Specimen: BLOOD RIGHT HAND  Result Value Ref Range Status   Specimen Description BLOOD RIGHT HAND  Final   Special Requests   Final    BOTTLES DRAWN AEROBIC AND ANAEROBIC Blood Culture adequate volume   Culture   Final     NO GROWTH 5 DAYS Performed at [facility], [place] [address], [place], [place] [address]    Report Status 07/03/2022 FINAL  Final  Culture, Respiratory w Gram Stain     Status: None   Collection Time: 06/28/22 12:56 PM   Specimen: Tracheal Aspirate; Respiratory  Result Value Ref Range Status   Specimen Description TRACHEAL ASPIRATE  Final   Special Requests NONE  Final   Gram Stain   Final    ABUNDANT SQUAMOUS EPITHELIAL CELLS PRESENT ABUNDANT WBC PRESENT,BOTH PMN AND MONONUCLEAR FEW BUDDING YEAST SEEN FEW GRAM POSITIVE COCCI Performed  at [facility], [place] [address], [place], [place] [address]    Culture FEW KLEBSIELLA OXYTOCA FEW CANDIDA LUSITANIAE   Final   Report Status 07/01/2022 FINAL  Final   Organism ID, Bacteria KLEBSIELLA OXYTOCA  Final      Susceptibility   Klebsiella oxytoca - MIC*    AMPICILLIN >=32 RESISTANT Resistant     CEFAZOLIN >=64 RESISTANT Resistant     CEFEPIME 1 SENSITIVE Sensitive     CEFTAZIDIME <=1 SENSITIVE Sensitive     CEFTRIAXONE 4 RESISTANT Resistant     CIPROFLOXACIN <=0.25 SENSITIVE Sensitive     GENTAMICIN <=1 SENSITIVE Sensitive     IMIPENEM <=0.25 SENSITIVE Sensitive     TRIMETH/SULFA <=20 SENSITIVE Sensitive     AMPICILLIN/SULBACTAM >=32 RESISTANT Resistant     PIP/TAZO >=128 RESISTANT Resistant     * FEW KLEBSIELLA OXYTOCA  Body fluid culture w Gram Stain     Status: None   Collection Time: 06/28/22  5:32 PM   Specimen: Peritoneal Washings; Body Fluid  Result Value Ref Range Status   Specimen Description PERITONEAL  Final   Special Requests NONE  Final   Gram Stain   Final    RARE WBC PRESENT,BOTH PMN AND MONONUCLEAR NO ORGANISMS SEEN    Culture   Final    NO GROWTH Performed at [facility], [address], [place], [place] [address]    Report Status 07/02/2022 FINAL  Final  MRSA Next Gen by PCR, Nasal     Status: None   Collection Time: 06/30/22  4:44 PM   Specimen: Nasal Mucosa; Nasal Swab  Result Value Ref  Range Status   MRSA by PCR Next Gen NOT DETECTED NOT DETECTED Final    Comment: (NOTE) The GeneXpert MRSA Assay (FDA approved for NASAL specimens only), is one component of a comprehensive MRSA colonization surveillance program. It is not intended to diagnose MRSA infection nor to guide or monitor treatment for MRSA infections. Test performance is not FDA approved in patients less than 61 years old. Performed at [facility], [place] [address], [place], [place] [address]     Lab Basic Metabolic Panel: Recent Labs  Lab 06/30/22 0125 06/30/22 1644 07/01/22 0242 07/01/22 1606 07/02/22 0301 07/02/22 1549 07/03/22 0330  NA 138   < > 137 134* 135 136 137  K 4.1   < > 4.6 4.4 4.6 4.6 5.0  CL 101   < > 101 99 101 101 99  CO2 22   < > 24 24 24 26 25   GLUCOSE 120*   < > 119* 140* 134* 159* 74  BUN 72*   < > 32* 25* 21* 20 19  CREATININE 1.49*   < > 0.80 0.63 0.58 0.57 0.56  CALCIUM 8.2*   < > 8.4* 8.0* 8.3* 8.6* 9.1  MG 2.4  --  2.5*  --  2.5*  --  2.7*  PHOS 5.0*   < > 3.2 2.9 2.8 2.9 2.9   < > = values in this interval not displayed.   Liver Function Tests: Recent Labs  Lab 06/29/22 0532 06/29/22 1631 06/30/22 0125 06/30/22 1644 07/01/22 0242 07/01/22 1606 07/02/22 0301 07/02/22 1549 07/03/22 0330  AST 98*  --  90*  --  81*  --  71*  --   --   ALT 30  --  32  --  33  --  36  --   --   ALKPHOS 142*  --  165*  --  195*  --  236*  --   --   BILITOT 3.3*  --  2.9*  --  2.8*  --  2.6*  --   --   PROT 5.7*  --  5.6*  --  5.8*  --  6.0*  --   --   ALBUMIN 2.2*   < > 2.1*  2.1*   < > 2.0* 2.0* 2.1*  2.1* 2.2* 2.4*   < > = values in this interval not displayed.   No results for input(s): "LIPASE", "AMYLASE" in the last 168 hours. Recent Labs  Lab 07/01/22 0242 07/02/22 0301  AMMONIA 33 <10   CBC: Recent Labs  Lab 06/29/22 0532 06/30/22 0125 07/01/22 0242 07/02/22 0301 07/03/22 0330  WBC 42.0* 31.9* 28.3* 23.6* 24.1*  NEUTROABS 42.0*  --   --   --   --    HGB 7.9* 7.4* 7.5* 7.9* 8.7*  HCT 24.3* 22.1* 23.7* 24.8* 26.4*  MCV 113.6* 112.2* 117.9* 117.5* 115.3*  PLT 172 153 172 183 201   Cardiac Enzymes: No results for input(s): "CKTOTAL", "CKMB", "CKMBINDEX", "TROPONINI" in the last 168 hours. Sepsis Labs: Recent Labs  Lab 06/28/22 1434 06/29/22 0532 06/30/22 0125 07/01/22 0242 07/02/22 0301 07/03/22 0330  WBC  --    < > 31.9* 28.3* 23.6* 24.1*  LATICACIDVEN 1.7  --   --   --   --   --    < > = values in this interval not displayed.    Procedures/Operations    [employer] 06/26/2022, 1:39 PM

## 2022-07-23 NOTE — Progress Notes (Signed)
Patient ID: Deanna Mcmillan, female   DOB: 02-03-1971, 52 y.o.   MRN: [id number]    Progress Note from the Palliative Medicine Team at [facility]   Patient Name: Deanna Mcmillan        Date: 07/30/2022 DOB: 16-Jul-1971  Age: 52 y.o. MRN#: [id number] Attending Physician: Jacky Kindle, MD Primary Care Physician: Mariel Sleet Admit Date: 06/02/2022   Medical records reviewed,  discussed with nursing   52 y.o. female  with past medical history of ETOH cirrhosis admitted on 06/13/2022 with severe sepsis and acute metabolic encephalopathy. 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 With acute respiratory failure as well - extubated 11/22. PMT consulted to discuss [place].      11/7: Intubated in ED and admitted to ICU 11/7: CTH neg, CT A/P cirrhosis with portal hypertension, splenomegaly, moderate volume ascites, moderate bilateral pleural effusion Blood culture - Listeria and MSSA and MSSE (per mom health dept thinks she got it at [facility] but patient does love cheese) Urine culture - Pan sensitive E colii 11/8 cutlures Trach - MSSA Blood - neg 11/9 KUB: No ileus 11/10: LP confirms listeria meningitis 11/11 KUB: ileus 11/12 ileus resolved with aggressive bowel regimen 11/15 KUB: No ileus. 11/18 more awake, following commands 1121 RUE PICC 11/22 extubated to BiPAP 11/23 - 14d of Rx complerted for MSSA/MESSE. For listeria - ID wants to extend amp/gent to 3 weeks 11/8-11/29. Ileus post extubation and NG to LIS . [place] increased. BP sofft an dlevophed and alb Rx.    11/24 -  OG placed yesterday for ileus and bile returns. TF on hold. Lactulose increased yesterday. REamins off vent. Did not need BiPAP. This AM more obtunded but mom thinks was awake at night and is sleeping due to fatigue. Afebrile.  But wBC up 13L. Neg baklance -3L:  OFF PRESSORs = Ammonia up at 72 - [place] mild periphepatic ascites Blood culture  11/25 - Now intubated due to obtunded encephaopathy. CTAbc with SBO v  Ileus with transition ponit . CCS consult - no surgery but ok for rectal lactulose w ith continue NG/OG tube to LIS. ID ordered repeat blood culture. Linezolid added.  No ntrition since 06/14/22 Pm. Creat worsening. LEss Ur OP. On levophed 43mcg. Not on sedation. Vent 40% Ammonia 108 Trach aspirate -Klebsiella oxytoca 11/27 - Remains intubated. On propofol 57mcg and levophed 28mcg. Febrile up to 101.46F overnight. On ampicillin and linezolid, going for IR guided LP today. Kidney function improving, repleting potassium. No seizure activity noted overnight on EEG. Surgery following for ileus, no need for surgical intervention. Holding TF for today.  11/29 - started on pulse dose steroids, getting autoimmune workup, CSF studies 12/3 - finished steroids, workup negative thus far 12/7 -  Paracentesis -rare WBC no organisms seen Blood culture  Tracheal aspirate: -Few Klebsiella oxytoca and few Candida LUSITANIAE 12/8 - CRRT started.  Remains encephalopathic. Febrile to 101.49F overnight, worsening leukocytosis. Paracentesis performed yesterday, without any notable findings thus far. On empiric vancomycin and merrem.  Starting CRRT today per nephrology.   12/9 -on the ventilator FiO2 40%.  Getting tube feeds.  On CRRT.  Not on pressors.  afebrile since 06/28/2022.  White count reduced to 32 K.  Neurology signed off.  Discontinued LTM.  Keppra has been increased to 1000 mg twice daily during the duration of CRRT.  Anasarca has improved.   07-02-22  remains unresponsive, neuro exam unchanged--family face advanced directive decisions    Decision made by family yesterday to liberate  from the ventilator and shift focus of care to comfort , allowing a natural death .   CCM directed extubation.   Patient appears comfortable on continue gtt.  This NP assessed patient at the bedside and spoke to family in waiting room.      Patient's mother and son/Deanna Mcmillan,  present.  Education offered regarding natural  trajectory and expectations at EOL.   Prognosis is likely hours.    Emotional support offered.   Questions and concerns addressed     Discussed with Dr Tacy Learn  25   minutes   Wadie Lessen NP  Palliative Medicine Team Team Phone # [phone] Pager [phone]

## 2022-07-23 DEATH — deceased

## 2022-09-04 LAB — MISCELLANEOUS TEST: Miscellaneous Test: 998272
